# Patient Record
Sex: Male | Born: 1945
Health system: Southern US, Community
[De-identification: ages and names within clinical notes are randomized; demographics above are authoritative.]

## PROBLEM LIST (undated history)

## (undated) DIAGNOSIS — N281 Cyst of kidney, acquired: Secondary | ICD-10-CM

## (undated) DIAGNOSIS — R112 Nausea with vomiting, unspecified: Secondary | ICD-10-CM

## (undated) DIAGNOSIS — E039 Hypothyroidism, unspecified: Secondary | ICD-10-CM

## (undated) DIAGNOSIS — M5481 Occipital neuralgia: Secondary | ICD-10-CM

## (undated) DIAGNOSIS — T8859XA Other complications of anesthesia, initial encounter: Secondary | ICD-10-CM

## (undated) DIAGNOSIS — N2889 Other specified disorders of kidney and ureter: Secondary | ICD-10-CM

## (undated) DIAGNOSIS — F419 Anxiety disorder, unspecified: Secondary | ICD-10-CM

## (undated) DIAGNOSIS — R351 Nocturia: Secondary | ICD-10-CM

## (undated) DIAGNOSIS — K219 Gastro-esophageal reflux disease without esophagitis: Secondary | ICD-10-CM

## (undated) DIAGNOSIS — I10 Essential (primary) hypertension: Secondary | ICD-10-CM

## (undated) DIAGNOSIS — H269 Unspecified cataract: Secondary | ICD-10-CM

## (undated) DIAGNOSIS — C801 Malignant (primary) neoplasm, unspecified: Secondary | ICD-10-CM

## (undated) DIAGNOSIS — N400 Enlarged prostate without lower urinary tract symptoms: Secondary | ICD-10-CM

## (undated) DIAGNOSIS — M1990 Unspecified osteoarthritis, unspecified site: Secondary | ICD-10-CM

## (undated) DIAGNOSIS — Z8719 Personal history of other diseases of the digestive system: Secondary | ICD-10-CM

## (undated) DIAGNOSIS — T4145XA Adverse effect of unspecified anesthetic, initial encounter: Secondary | ICD-10-CM

## (undated) DIAGNOSIS — E785 Hyperlipidemia, unspecified: Secondary | ICD-10-CM

## (undated) DIAGNOSIS — G8929 Other chronic pain: Secondary | ICD-10-CM

## (undated) DIAGNOSIS — H3530 Unspecified macular degeneration: Secondary | ICD-10-CM

## (undated) DIAGNOSIS — I2510 Atherosclerotic heart disease of native coronary artery without angina pectoris: Secondary | ICD-10-CM

## (undated) DIAGNOSIS — Z9889 Other specified postprocedural states: Secondary | ICD-10-CM

## (undated) HISTORY — DX: Benign prostatic hyperplasia without lower urinary tract symptoms: N40.0

## (undated) HISTORY — PX: THYROIDECTOMY, PARTIAL: SHX18

## (undated) HISTORY — PX: APPENDECTOMY: SHX54

## (undated) HISTORY — DX: Occipital neuralgia: M54.81

## (undated) HISTORY — DX: Cyst of kidney, acquired: N28.1

## (undated) HISTORY — DX: Other chronic pain: G89.29

## (undated) HISTORY — PX: NASAL SINUS SURGERY: SHX719

## (undated) HISTORY — PX: TONSILLECTOMY: SUR1361

## (undated) HISTORY — DX: Hyperlipidemia, unspecified: E78.5

## (undated) HISTORY — DX: Nocturia: R35.1

## (undated) HISTORY — PX: COLONOSCOPY: SHX174

## (undated) HISTORY — PX: HERNIA REPAIR: SHX51

## (undated) HISTORY — PX: HEMORRHOID SURGERY: SHX153

---

## 1987-11-12 DIAGNOSIS — C801 Malignant (primary) neoplasm, unspecified: Secondary | ICD-10-CM

## 1987-11-12 HISTORY — DX: Malignant (primary) neoplasm, unspecified: C80.1

## 1996-11-11 DIAGNOSIS — Z85850 Personal history of malignant neoplasm of thyroid: Secondary | ICD-10-CM

## 1996-11-11 DIAGNOSIS — E039 Hypothyroidism, unspecified: Secondary | ICD-10-CM

## 1996-11-11 HISTORY — DX: Personal history of malignant neoplasm of thyroid: Z85.850

## 1996-11-11 HISTORY — PX: THYROIDECTOMY, PARTIAL: SHX18

## 1996-11-11 HISTORY — DX: Hypothyroidism, unspecified: E03.9

## 1998-07-12 ENCOUNTER — Ambulatory Visit (HOSPITAL_BASED_OUTPATIENT_CLINIC_OR_DEPARTMENT_OTHER): Admission: RE | Admit: 1998-07-12 | Discharge: 1998-07-12 | Payer: Self-pay | Admitting: Otolaryngology

## 2000-01-29 ENCOUNTER — Ambulatory Visit (HOSPITAL_COMMUNITY): Admission: RE | Admit: 2000-01-29 | Discharge: 2000-01-29 | Payer: Self-pay | Admitting: *Deleted

## 2001-04-09 ENCOUNTER — Encounter: Admission: RE | Admit: 2001-04-09 | Discharge: 2001-04-09 | Payer: Self-pay | Admitting: Endocrinology

## 2001-04-09 ENCOUNTER — Encounter: Payer: Self-pay | Admitting: Endocrinology

## 2001-04-10 ENCOUNTER — Encounter: Payer: Self-pay | Admitting: Endocrinology

## 2001-04-10 ENCOUNTER — Encounter: Admission: RE | Admit: 2001-04-10 | Discharge: 2001-04-10 | Payer: Self-pay | Admitting: Endocrinology

## 2001-11-23 ENCOUNTER — Encounter (INDEPENDENT_AMBULATORY_CARE_PROVIDER_SITE_OTHER): Payer: Self-pay | Admitting: *Deleted

## 2001-11-23 ENCOUNTER — Ambulatory Visit (HOSPITAL_BASED_OUTPATIENT_CLINIC_OR_DEPARTMENT_OTHER): Admission: RE | Admit: 2001-11-23 | Discharge: 2001-11-23 | Payer: Self-pay | Admitting: General Surgery

## 2004-05-15 ENCOUNTER — Encounter (INDEPENDENT_AMBULATORY_CARE_PROVIDER_SITE_OTHER): Payer: Self-pay | Admitting: Specialist

## 2004-05-15 ENCOUNTER — Ambulatory Visit (HOSPITAL_COMMUNITY): Admission: RE | Admit: 2004-05-15 | Discharge: 2004-05-15 | Payer: Self-pay | Admitting: *Deleted

## 2007-12-14 ENCOUNTER — Ambulatory Visit (HOSPITAL_COMMUNITY): Admission: RE | Admit: 2007-12-14 | Discharge: 2007-12-14 | Payer: Self-pay | Admitting: *Deleted

## 2009-08-30 ENCOUNTER — Encounter: Admission: RE | Admit: 2009-08-30 | Discharge: 2009-08-30 | Payer: Self-pay | Admitting: Endocrinology

## 2011-03-26 NOTE — Op Note (Signed)
NAME:  Eric Suarez, Eric Suarez NO.:  [id number]   MEDICAL RECORD NO.:  [id number]          PATIENT TYPE:  AMB   LOCATION:  ENDO                         FACILITY:  [facility]   PHYSICIAN:  Georgiana Spinner, M.D.    DATE OF BIRTH:  14-Jan-1946   DATE OF PROCEDURE:  12/14/2007  DATE OF DISCHARGE:                               OPERATIVE REPORT   PROCEDURE:  Upper endoscopy with Savary dilation.   INDICATIONS:  Dysphagia with known esophageal stricture.   ANESTHESIA:  Demerol 100 mg, Versed 5 mg.   PROCEDURE:  With the patient mildly sedated in the left lateral  decubitus position, the Pentax videoscopic endoscope was inserted in the  mouth and passed under direct vision through the esophagus which  appeared normal until we reached the distal esophagus, and there was a  stricture seen above a hiatal hernia.  This was photographed.  We  entered into the stomach.  Fundus, body, antrum, duodenal bulb, and  second portion duodenum were visualized.  From this point, the endoscope  was slowly withdrawn taking circumferential views of duodenal mucosa  until the endoscope had been pulled back into stomach and placed in  retroflexion to view the stomach from below.  The endoscope was then  straightened and withdrawn after a guidewire had been passed.  Subsequently, Savary dilators 16 and 18 were passed over the guidewire  using fluoroscopic control.  With the latter, the guidewire was removed.  The endoscope was reinserted.  A small amount of blood was noted at the  stricture.  Otherwise, this exam was unremarkable.  The endoscope was  withdrawn.  The patient's vital signs and pulse oximeter remained  stable.  The patient tolerated the procedure well without apparent  complications.   FINDINGS:  Distal esophageal stricture dilated to 16 and 18 Savary.   PLAN:  Await clinical response.  The patient will follow-up with me as  needed as an outpatient.     ______________________________  Georgiana Spinner, M.D.     GMO/MEDQ  D:  12/14/2007  T:  12/14/2007  Job:  [id number]

## 2011-03-29 NOTE — Op Note (Signed)
. [facility]  Patient:    Eric Suarez, Eric Suarez                     MRN: [id number] Adm. Date:  [id number] Attending:  Sabino Gasser                           Operative Report  PROCEDURE:  Colonoscopy.  INDICATIONS:  Rectal bleeding.  ANESTHESIA:  Demerol additionally 30 mg and Versed 3 mg were given intravenously in divided dose.  DESCRIPTION OF PROCEDURE:  With patient mildly sedated in the left lateral decubitus position, the Olympus videoscopic colonoscope was inserted in the rectum after normal rectal exam and passed under direct vision to the cecum.  The cecum was identified by the ileocecal valve and appendiceal orifice, both of which were photographed.  From this point, the colonoscope was slowly withdrawn taking circumferential views of the entire colonic mucosa stopping only to photograph are diverticula seen in the sigmoid colon until we pulled to the rectum which appeared normal in direct view and retroflex view as well.  The endoscope was then straightened and withdrawn.  The patients vital signs, pulse oximeter range was  stable.  The patient tolerated the procedure well without apparent complications.  FINDINGS:  Rare diverticulum of sigmoid colon; otherwise, unremarkable colonoscopic examination.  PLAN:  Will have the patient follow up with me in about six weeks for evaluation and treatment further of his reflux symptomatology. DD:  01/29/00 TD:  01/29/00 Job: [id number]

## 2011-03-29 NOTE — Op Note (Signed)
[place]  Patient:    Eric Suarez, Eric Suarez Visit Number: [id number] MRN: [id number]          Service Type: DSU Location: [facility] Attending Physician:  Henrene Dodge Dictated by:   Claire Shown. Zachery Dakins, M.D. Proc. Date: 11/23/01 Admit Date:  11/23/2001   CC:         Bernadene Person, M.D.   Operative Report  PREOPERATIVE DIAGNOSIS:  Bleeding internal hemorrhoids, polyp posterior.  POSTOPERATIVE DIAGNOSIS:  Bleeding internal hemorrhoids, polyp posterior.  OPERATION PERFORMED:  Excision of bleeding hemorrhoids anterior and posterior. and also external hemorrhoids posterior.  SURGEON:  Anselm Pancoast. Zachery Dakins, M.D.  ANESTHESIA:  General.  INDICATIONS FOR PROCEDURE:  The patient is a 65 year old male who I saw in the office last week.  He has had problems with recurring bleeding any time he does any strenuous lifting or squatting and on examination you could see internal hemorrhoids predominantly anterior and posterior but the area posterior was firm and had a little atypical appearance as if it was probably a polyp instead of actually fibrotic internal hemorrhoids.  He does not have a lot of external hemorrhoids.  There is a little tag anteriorly and I recommended that we excise this under general anesthesia and examine him more thoroughly.  The patient was in agreement and desired to have the procedure done promptly.  The patient had a colonoscopy approximately a year and a half ago by Dr. Sabino Gasser.  DESCRIPTION OF PROCEDURE:  The patient was taken to the operating room inducted general anesthesia.  Placed up in stirrups and then first I proctoscoped him and besides of the anal pathology, there was no abnormalities noted in the mucosa on up to about 20 cm.  It then removed the proctoscope and then prepped the anal area and external area with Betadine surgical scrub and solution and draped in a sterile manner.  A bullet retractor was used  and on examination, the internal hemorrhoid anteriorly looked just like kind of a large internal hemorrhoid.  The area posterior does not really look that muchy like a hemorrhoid but there is obviously a little growth that looks like a polyp at the base itself is fairly firm and whether this is kind of posterior fissure that has with kind of a sentinel pile little higher its just what I am not sure.  I first excised the anterior hemorrhoid kind of elevated it on a pedicle, underclamped it with Bowie clamp and then sutured the vessels with interrupted 2-0 chromic and then closed the little incision with a running 2-0 chromic for good hemostasis.  There was a little external tag I excised and I closed this with 3-0 chromic.  Then posterior, the area of concern I elected to basically use the larger bullet on this for better exposure and then elevated this fibrotic firm area off the internal sphincter and then the area more proximally completely excising the area.  Next, as far as there was not a significant amount of bleeding and I actually sutured the area using good locking sutures on the higher portion and then closing the external basically the internal sphincter area with the running 2-0 chromic.  I did divide a few of the internal sphincter fibers posterior in case it is kind of a chronic fissure type problem.  At the completion I looked at the suture line carefully whether to put additional stitches but there was no evidence of any bleeding and I watched it until  he was basically nearly awake.  I then used the smaller anoscope and put Xylocaine ointment within the anal canal on a little gauze kind of poked into the canal and then I removed all retractors , etc.  The patient will be released after a short stay in the recovery room.  He has had a previous problem with difficulty voiding and he is aware that if he is not able to void by later in the afternoon that he will need to return for  a Foley catheter since he had urinary retention and required a Foley catheter for approximately three weeks after appendectomy years ago.  I made sure that the two specimens were definitely labeled so that the posterior area could be examined.  I think that this is kind of a little polyp just above the dentate line plus a kind of chronic scarring and not that of an obvious malignant growth from gross examination. Dictated by:   Claire Shown. Zachery Dakins, M.D. Attending Physician:  Henrene Dodge DD:  11/23/01 TD:  11/23/01 Job: [id number]

## 2011-03-29 NOTE — Procedures (Signed)
[place]. [facility]  Patient:    RILYN, UPSHAW                     MRN: [id number] Proc. Date: 01/29/00 Adm. Date:  [id number] Attending:  Sabino Gasser                           Procedure Report  PROCEDURE:  Upper endoscopy.  INDICATIONS:  Reflux with pain.  ANESTHESIA:  Demerol 70 mg, Versed 7 mg was given intravenously in divided dose.  PROCEDURE:  With patient mildly sedated in the left lateral decubitus position, the Olympus videoscopic endoscope was inserted in the mouth and passed under direct  vision through the esophagus.  Distal esophagus was approached and appeared normal and was photographed.  We entered into the stomach, the antrum, duodenal bulb, second portion of duodenum and all appeared normal and were photographed.  From  this point, the endoscope was slowly withdrawn, taking circumferential views of the entire duodenal mucosa until the endoscope had been pulled back into the stomach, placed on retroflexion to view the stomach from below and this appeared grossly  normal.  The endoscope was then straightened and pulled back from distal to proximal stomach taking circumferential views of the entire gastric and subsequently esophageal mucosa, all of which appeared normal.   Patients vital signs and pulse oximeter remained stable.  The patient tolerated the procedure ell without apparent complications.  FINDINGS:  Essentially normal endoscopic examination.  PLAN:  Will treat his reflux symptoms symptomatically and proceed with colonoscopy as planned. DD:  01/29/00 TD:  01/29/00 Job: [id number]

## 2011-03-29 NOTE — Op Note (Signed)
NAME:  WYN, NETTLE                        ACCOUNT NO.:  [id number]   MEDICAL RECORD NO.:  [id number]                   PATIENT TYPE:  AMB   LOCATION:  ENDO                                 FACILITY:  [facility]   PHYSICIAN:  Georgiana Spinner, M.D.                 DATE OF BIRTH:  [birth year]   DATE OF PROCEDURE:  DATE OF DISCHARGE:                                 OPERATIVE REPORT   PROCEDURE:  Colonoscopy.   ENDOSCOPIST:  Georgiana Spinner, M.D.   INDICATIONS:  Hemoccult positivity.   ANESTHESIA:  Demerol 20, Versed 2 mg.   DESCRIPTION OF PROCEDURE:  With the patient mildly sedated in the left  lateral decubitus position, the Olympus videoscopic colonoscope was inserted  in the rectum, passed under direct vision to the cecum, identified by the  ileocecal valve and appendiceal orifice, both of which were photographed.  We entered into the terminal ileum which also appeared normal.  From the  point, the colonoscope was then slowly withdrawn taking circumferential  views of the colonic mucosa stopping in the sigmoid colon where  approximately 40 cm from the anal verge at which point a small polyp was  seen, photographed, and removed using hot biopsy forceps technique at a  setting of 20/200 with the ERBE pulse generator.  The endoscope was then  withdrawn to the rectum which appeared normal on direct and showed scarring  from his previous hemorrhoidectomy on retroflexed view with no hemorrhoids  noted.  The endoscope was straightened and withdrawn.  The patient's vital  signs and pulse oximeter remained stable.  The patient tolerated the  procedure well without apparent complications.   FINDINGS:  Small polyp at 40 cm from the anal verge in the sigmoid colon,  otherwise unremarkable colonoscopic examination, other than scattered  diverticula.   PLAN:  Await biopsy reported.  The patient will call me for the results and  follow up with me as an outpatient.                   Georgiana Spinner, M.D.    GMO/MEDQ  D:  05/15/2004  T:  05/15/2004  Job:  [id number]

## 2011-03-29 NOTE — Op Note (Signed)
NAME:  Eric Suarez, Eric Suarez                        ACCOUNT NO.:  [id number]   MEDICAL RECORD NO.:  [id number]                   PATIENT TYPE:  AMB   LOCATION:  ENDO                                 FACILITY:  [facility]   PHYSICIAN:  Georgiana Spinner, M.D.                 DATE OF BIRTH:  [birth year]   DATE OF PROCEDURE:  05/15/2004  DATE OF DISCHARGE:                                 OPERATIVE REPORT   PROCEDURE:  Upper endoscopy.   INDICATIONS:  Hemoccult positivity.   ANESTHESIA:  Demerol 60 mg, Versed 6 mg.   PROCEDURE:  With the patient mildly sedated in the left lateral decubitus  position, the Olympus videoscopic endoscope was inserted in the mouth,  passed under direct vision through the esophagus, which appeared normal.  There was a question of a distal esophageal stricture, photographed.  We  entered into the stomach.  The fundus, body, antrum, duodenal bulb, second  portion of the duodenum all appeared normal.  From this point the endoscope  was slowly withdrawn taking circumferential views of the duodenal mucosa  until the endoscope had been pulled back in the stomach, placed in  retroflexion to view the stomach from below.  The endoscope was straightened  and withdrawn taking circumferential views of the remaining gastric and  esophageal mucosa.  The patient's vital signs and pulse oximetry remained  stable.  The patient tolerated the procedure well with no apparent  complications.   FINDINGS:  Question of distal esophageal stricture by a hiatal hernia,  otherwise unremarkable examination.   PLAN:  Proceed to colonoscopy.                                               Georgiana Spinner, M.D.    GMO/MEDQ  D:  05/15/2004  T:  05/15/2004  Job:  [id number]

## 2015-11-12 DIAGNOSIS — C642 Malignant neoplasm of left kidney, except renal pelvis: Secondary | ICD-10-CM

## 2015-11-12 HISTORY — DX: Malignant neoplasm of left kidney, except renal pelvis: C64.2

## 2016-02-05 DIAGNOSIS — E291 Testicular hypofunction: Secondary | ICD-10-CM | POA: Diagnosis not present

## 2016-02-05 DIAGNOSIS — Z125 Encounter for screening for malignant neoplasm of prostate: Secondary | ICD-10-CM | POA: Diagnosis not present

## 2016-02-05 DIAGNOSIS — E789 Disorder of lipoprotein metabolism, unspecified: Secondary | ICD-10-CM | POA: Diagnosis not present

## 2016-02-05 DIAGNOSIS — Z0000 Encounter for general adult medical examination without abnormal findings: Secondary | ICD-10-CM | POA: Diagnosis not present

## 2016-02-05 DIAGNOSIS — I10 Essential (primary) hypertension: Secondary | ICD-10-CM | POA: Diagnosis not present

## 2016-02-06 DIAGNOSIS — H353132 Nonexudative age-related macular degeneration, bilateral, intermediate dry stage: Secondary | ICD-10-CM | POA: Diagnosis not present

## 2016-02-06 DIAGNOSIS — H2513 Age-related nuclear cataract, bilateral: Secondary | ICD-10-CM | POA: Diagnosis not present

## 2016-02-12 DIAGNOSIS — R7309 Other abnormal glucose: Secondary | ICD-10-CM | POA: Diagnosis not present

## 2016-02-12 DIAGNOSIS — E789 Disorder of lipoprotein metabolism, unspecified: Secondary | ICD-10-CM | POA: Diagnosis not present

## 2016-02-12 DIAGNOSIS — I10 Essential (primary) hypertension: Secondary | ICD-10-CM | POA: Diagnosis not present

## 2016-02-25 ENCOUNTER — Other Ambulatory Visit: Payer: Self-pay

## 2016-02-25 DIAGNOSIS — K279 Peptic ulcer, site unspecified, unspecified as acute or chronic, without hemorrhage or perforation: Secondary | ICD-10-CM | POA: Diagnosis not present

## 2016-02-25 DIAGNOSIS — I10 Essential (primary) hypertension: Secondary | ICD-10-CM | POA: Diagnosis not present

## 2016-02-25 DIAGNOSIS — R109 Unspecified abdominal pain: Secondary | ICD-10-CM | POA: Diagnosis not present

## 2016-02-25 DIAGNOSIS — D176 Benign lipomatous neoplasm of spermatic cord: Secondary | ICD-10-CM | POA: Diagnosis not present

## 2016-02-25 DIAGNOSIS — N178 Other acute kidney failure: Secondary | ICD-10-CM | POA: Diagnosis not present

## 2016-02-25 DIAGNOSIS — Y834 Other reconstructive surgery as the cause of abnormal reaction of the patient, or of later complication, without mention of misadventure at the time of the procedure: Secondary | ICD-10-CM | POA: Diagnosis not present

## 2016-02-25 DIAGNOSIS — E876 Hypokalemia: Secondary | ICD-10-CM | POA: Diagnosis not present

## 2016-02-25 DIAGNOSIS — K4030 Unilateral inguinal hernia, with obstruction, without gangrene, not specified as recurrent: Secondary | ICD-10-CM | POA: Diagnosis not present

## 2016-02-25 DIAGNOSIS — E7800 Pure hypercholesterolemia, unspecified: Secondary | ICD-10-CM | POA: Diagnosis not present

## 2016-02-25 DIAGNOSIS — N2889 Other specified disorders of kidney and ureter: Secondary | ICD-10-CM | POA: Diagnosis not present

## 2016-02-25 DIAGNOSIS — I9581 Postprocedural hypotension: Secondary | ICD-10-CM | POA: Diagnosis not present

## 2016-02-25 DIAGNOSIS — E872 Acidosis: Secondary | ICD-10-CM | POA: Diagnosis not present

## 2016-02-25 DIAGNOSIS — D1779 Benign lipomatous neoplasm of other sites: Secondary | ICD-10-CM | POA: Diagnosis not present

## 2016-02-25 DIAGNOSIS — K4090 Unilateral inguinal hernia, without obstruction or gangrene, not specified as recurrent: Secondary | ICD-10-CM | POA: Diagnosis not present

## 2016-02-25 DIAGNOSIS — K4031 Unilateral inguinal hernia, with obstruction, without gangrene, recurrent: Secondary | ICD-10-CM | POA: Diagnosis not present

## 2016-02-25 DIAGNOSIS — E039 Hypothyroidism, unspecified: Secondary | ICD-10-CM | POA: Diagnosis not present

## 2016-02-25 DIAGNOSIS — D179 Benign lipomatous neoplasm, unspecified: Secondary | ICD-10-CM | POA: Diagnosis not present

## 2016-02-29 DIAGNOSIS — K4030 Unilateral inguinal hernia, with obstruction, without gangrene, not specified as recurrent: Secondary | ICD-10-CM | POA: Insufficient documentation

## 2016-03-07 DIAGNOSIS — Z09 Encounter for follow-up examination after completed treatment for conditions other than malignant neoplasm: Secondary | ICD-10-CM | POA: Insufficient documentation

## 2016-03-12 DIAGNOSIS — D49512 Neoplasm of unspecified behavior of left kidney: Secondary | ICD-10-CM | POA: Diagnosis not present

## 2016-03-12 DIAGNOSIS — Z0000 Encounter for general adult medical examination without abnormal findings: Secondary | ICD-10-CM | POA: Diagnosis not present

## 2016-03-13 ENCOUNTER — Other Ambulatory Visit: Payer: Self-pay | Admitting: Urology

## 2016-03-18 DIAGNOSIS — D49512 Neoplasm of unspecified behavior of left kidney: Secondary | ICD-10-CM | POA: Diagnosis not present

## 2016-03-18 DIAGNOSIS — N2889 Other specified disorders of kidney and ureter: Secondary | ICD-10-CM | POA: Diagnosis not present

## 2016-03-26 NOTE — Patient Instructions (Addendum)
PERICLES CLUSTER  03/26/2016   Your procedure is scheduled on: Monday 04/01/2016  Report to [facility]  Entrance take [facility]  elevators to 3rd floor to  [place] at 145  PM.  Call this number if you have problems the morning of surgery [phone]   Remember: ONLY 1 PERSON MAY GO WITH YOU TO SHORT STAY TO GET  READY MORNING OF [place].  Do not eat food or drink liquids :After Midnight.     Take these medicines the morning of surgery with A SIP OF WATER: Paxil, Levothyroxine                                You may not have any metal on your body including hair pins and              piercings  Do not wear jewelry, make-up, lotions, powders or perfumes, deodorant             Do not wear nail polish.  Do not shave  48 hours prior to surgery.              Men may shave face and neck.   Do not bring valuables to the hospital. [place].  Contacts, dentures or bridgework may not be worn into surgery.  Leave suitcase in the car. After surgery it may be brought to your room.     Patients discharged the day of surgery will not be allowed to drive home.  Name and phone number of your driver:  Special Instructions: N/A              Please read over the following fact sheets you were given: _____________________________________________________________________             [facility] - Preparing for Surgery Before surgery, you can play an important role.  Because skin is not sterile, your skin needs to be as free of germs as possible.  You can reduce the number of germs on your skin by washing with CHG (chlorahexidine gluconate) soap before surgery.  CHG is an antiseptic cleaner which kills germs and bonds with the skin to continue killing germs even after washing. Please DO NOT use if you have an allergy to CHG or antibacterial soaps.  If your skin becomes reddened/irritated stop using the CHG and  inform your nurse when you arrive at Short Stay. Do not shave (including legs and underarms) for at least 48 hours prior to the first CHG shower.  You may shave your face/neck. Please follow these instructions carefully:  1.  Shower with CHG Soap the night before surgery and the  morning of Surgery.  2.  If you choose to wash your hair, wash your hair first as usual with your  normal  shampoo.  3.  After you shampoo, rinse your hair and body thoroughly to remove the  shampoo.                           4.  Use CHG as you would any other liquid soap.  You can apply chg directly  to the skin and wash  Gently with a scrungie or clean washcloth.  5.  Apply the CHG Soap to your body ONLY FROM THE NECK DOWN.   Do not use on face/ open                           Wound or open sores. Avoid contact with eyes, ears mouth and genitals (private parts).                       Wash face,  Genitals (private parts) with your normal soap.             6.  Wash thoroughly, paying special attention to the area where your surgery  will be performed.  7.  Thoroughly rinse your body with warm water from the neck down.  8.  DO NOT shower/wash with your normal soap after using and rinsing off  the CHG Soap.                9.  Pat yourself dry with a clean towel.            10.  Wear clean pajamas.            11.  Place clean sheets on your bed the night of your first shower and do not  sleep with pets. Day of Surgery : Do not apply any lotions/deodorants the morning of surgery.  Please wear clean clothes to the hospital/surgery center.  FAILURE TO FOLLOW THESE INSTRUCTIONS MAY RESULT IN THE CANCELLATION OF YOUR SURGERY PATIENT SIGNATURE_________________________________  NURSE SIGNATURE__________________________________  ________________________________________________________________________   Eric Suarez  An incentive spirometer is a tool that can help keep your lungs clear and  active. This tool measures how well you are filling your lungs with each breath. Taking long deep breaths may help reverse or decrease the chance of developing breathing (pulmonary) problems (especially infection) following:  A long period of time when you are unable to move or be active. BEFORE THE PROCEDURE   If the spirometer includes an indicator to show your best effort, your nurse or respiratory therapist will set it to a desired goal.  If possible, sit up straight or lean slightly forward. Try not to slouch.  Hold the incentive spirometer in an upright position. INSTRUCTIONS FOR USE  1. Sit on the edge of your bed if possible, or sit up as far as you can in bed or on a chair. 2. Hold the incentive spirometer in an upright position. 3. Breathe out normally. 4. Place the mouthpiece in your mouth and seal your lips tightly around it. 5. Breathe in slowly and as deeply as possible, raising the piston or the ball toward the top of the column. 6. Hold your breath for 3-5 seconds or for as long as possible. Allow the piston or ball to fall to the bottom of the column. 7. Remove the mouthpiece from your mouth and breathe out normally. 8. Rest for a few seconds and repeat Steps 1 through 7 at least 10 times every 1-2 hours when you are awake. Take your time and take a few normal breaths between deep breaths. 9. The spirometer may include an indicator to show your best effort. Use the indicator as a goal to work toward during each repetition. 10. After each set of 10 deep breaths, practice coughing to be sure your lungs are clear. If you have an incision (the cut made at the time of surgery),  support your incision when coughing by placing a pillow or rolled up towels firmly against it. Once you are able to get out of bed, walk around indoors and cough well. You may stop using the incentive spirometer when instructed by your caregiver.  RISKS AND COMPLICATIONS  Take your time so you do not get  dizzy or light-headed.  If you are in pain, you may need to take or ask for pain medication before doing incentive spirometry. It is harder to take a deep breath if you are having pain. AFTER USE  Rest and breathe slowly and easily.  It can be helpful to keep track of a log of your progress. Your caregiver can provide you with a simple table to help with this. If you are using the spirometer at home, follow these instructions: [place] IF:   You are having difficultly using the spirometer.  You have trouble using the spirometer as often as instructed.  Your pain medication is not giving enough relief while using the spirometer.  You develop fever of 100.5 F (38.1 C) or higher. SEEK IMMEDIATE MEDICAL CARE IF:   You cough up bloody sputum that had not been present before.  You develop fever of 102 F (38.9 C) or greater.  You develop worsening pain at or near the incision site. MAKE SURE YOU:   Understand these instructions.  Will watch your condition.  Will get help right away if you are not doing well or get worse. Document Released: 03/10/2007 Document Revised: 01/20/2012 Document Reviewed: 05/11/2007 [facility] Patient Information 2014 [place], [place].   ________________________________________________________________________

## 2016-03-27 ENCOUNTER — Ambulatory Visit (HOSPITAL_COMMUNITY)
Admission: RE | Admit: 2016-03-27 | Discharge: 2016-03-27 | Disposition: A | Discharge status: Home / Self Care | Payer: PPO | Source: Ambulatory Visit | Attending: Urology | Admitting: Urology

## 2016-03-27 ENCOUNTER — Encounter (HOSPITAL_COMMUNITY): Payer: Self-pay

## 2016-03-27 ENCOUNTER — Encounter (HOSPITAL_COMMUNITY)
Admission: RE | Admit: 2016-03-27 | Discharge: 2016-03-27 | Disposition: A | Discharge status: Home / Self Care | Payer: PPO | Source: Ambulatory Visit | Attending: Urology | Admitting: Urology

## 2016-03-27 DIAGNOSIS — I10 Essential (primary) hypertension: Secondary | ICD-10-CM | POA: Diagnosis not present

## 2016-03-27 DIAGNOSIS — Z01818 Encounter for other preprocedural examination: Secondary | ICD-10-CM | POA: Diagnosis not present

## 2016-03-27 DIAGNOSIS — R918 Other nonspecific abnormal finding of lung field: Secondary | ICD-10-CM | POA: Insufficient documentation

## 2016-03-27 HISTORY — DX: Gastro-esophageal reflux disease without esophagitis: K21.9

## 2016-03-27 HISTORY — DX: Unspecified osteoarthritis, unspecified site: M19.90

## 2016-03-27 HISTORY — DX: Essential (primary) hypertension: I10

## 2016-03-27 HISTORY — DX: Atherosclerotic heart disease of native coronary artery without angina pectoris: I25.10

## 2016-03-27 HISTORY — DX: Adverse effect of unspecified anesthetic, initial encounter: T41.45XA

## 2016-03-27 HISTORY — DX: Unspecified macular degeneration: H35.30

## 2016-03-27 HISTORY — DX: Hypothyroidism, unspecified: E03.9

## 2016-03-27 HISTORY — DX: Unspecified cataract: H26.9

## 2016-03-27 HISTORY — DX: Other complications of anesthesia, initial encounter: T88.59XA

## 2016-03-27 LAB — CBC
HCT: 39.4 % (ref 39.0–52.0)
Hemoglobin: 13.5 g/dL (ref 13.0–17.0)
MCH: 31.2 pg (ref 26.0–34.0)
MCHC: 34.3 g/dL (ref 30.0–36.0)
MCV: 91 fL (ref 78.0–100.0)
PLATELETS: 171 10*3/uL (ref 150–400)
RBC: 4.33 MIL/uL (ref 4.22–5.81)
RDW: 14.2 % (ref 11.5–15.5)
WBC: 7.1 10*3/uL (ref 4.0–10.5)

## 2016-03-27 LAB — BASIC METABOLIC PANEL
Anion gap: 9 (ref 5–15)
BUN: 29 mg/dL — ABNORMAL HIGH (ref 6–20)
CALCIUM: 8.7 mg/dL — AB (ref 8.9–10.3)
CHLORIDE: 108 mmol/L (ref 101–111)
CO2: 22 mmol/L (ref 22–32)
Creatinine, Ser: 1.22 mg/dL (ref 0.61–1.24)
GFR calc non Af Amer: 58 mL/min — ABNORMAL LOW (ref >60)
GLUCOSE: 99 mg/dL (ref 65–99)
Potassium: 3.5 mmol/L (ref 3.5–5.1)
Sodium: 139 mmol/L (ref 135–145)

## 2016-03-28 NOTE — Progress Notes (Signed)
Chest xray results faxed to dr borden main fax medical record and pod fax by epic

## 2016-03-31 NOTE — H&P (Signed)
Chief Complaint Left renal neoplasm   History of Present Illness Eric Suarez is a pleasant 70 year old gentleman seen today at the request of Dr. Eda Keys for a left renal neoplasm. Approximately 2 weeks ago, he presented to the emergency department at [facility] in [place]. He presented with severe abdominal pain which prompted a CT scan of the chest, abdomen, and pelvis with contrast. He was noted to have a large incarcerated left inguinal hernia as the source of his pain. He underwent surgical treatment including a left open inguinal hernia repair and subsequent exploratory laparoscopy to ensure that his bowel remained viable. He ultimately did not require any surgery on his bowel. Incidentally, he was noted to have a few small bilateral pulmonary nodules. He also was noted to have a 4.7 cm heterogeneous appearing upper pole left renal mass. This mass did appear hyperdense raising concern for enhancement although no noncontrasted scan was performed. This mass also appeared consistent with a renal cell carcinoma although was completely endophytic and does raise a question that it could represent a urothelial carcinoma although delayed images did not strongly suggest this. He has denied any hematuria or flank pain. He denies any unintentional weight loss, night sweats, or poor appetite. He has been healing well from his hernia repair and has no specific complaints today.    He has been followed by Dr. Junious Silk in the past for a history of an elevated PSA. He had previously undergone a prostate biopsy by Dr. Serita Butcher when his PSA was around 5 in the past. His more recent PSA levels have been in the mid 3 range. He also has a history of BPH although has not required medical therapy. He has wife have actively on and worked for their own [employer] until the spring when he retired after his recent hospitalization.    He has no family history of kidney cancer or end-stage renal  disease.   Past Medical History Problems  1. History of Arthritis 2. History of acute prostatitis (Z87.438) 3. History of heartburn (Z87.898) 4. History of hypertension (Z86.79) 5. History of Thyroid Cancer  He has a history of thyroid cancer status post surgical removal in 1989. He has had no evidence of recurrence.   Surgical History Problems  1. History of Appendectomy 2. History of Exploratory Laparoscopy 3. History of Hemorrhoidectomy 4. History of Hernia Repair 5. History of Thyroid Surgery  His prior surgeries include a history of a right lower quadrant incision for an appendectomy, multiple bilateral open inguinal hernia repairs including his most recent repair on the left side which was his third on that side. He also has undergone exploratory laparoscopy stated above.   Current Meds 1. Atenolol 50 MG Oral Tablet;  Therapy: (Recorded:18Jan2008) to Recorded 2. Benicar TABS (Olmesartan Medoxomil);  Therapy: ([id number]) to Recorded 3. Chlorthalidone 25 MG Oral Tablet; TAKE 1 TABLET DAILY AS NEEDED FOR BLOOD  PRESSURE;  Therapy: 12Sep2012 to Recorded 4. Fish Oil CAPS;  Therapy: (Recorded:18Jan2008) to Recorded 5. HydroCHLOROthiazide 25 MG Oral Tablet;  Therapy: (Recorded:18Jan2008) to Recorded 6. Klor-Con M20 20 MEQ Oral Tablet Extended Release;  Therapy: [id number] to Recorded 7. Prevacid 15 MG Oral Capsule Delayed Release (Lansoprazole);  Therapy: (Recorded:18Jan2008) to Recorded 8. Synthroid TABS (Levothyroxine Sodium);  Therapy: (Recorded:18Jan2008) to Recorded  Allergies Medication  1. Sulfa Drugs  Family History Problems  1. Family history of Acute Myocardial Infarction : Mother 2. Family history of Death In The Family Father   Deceased at age 45 3. Family  history of Death In The Family Mother   Deceased at age 78; MI 94. Family history of Family Health Status Number Of Children   1 son 5. Family history of Hypertension : Father 66. Family  history of Hypertension : Mother 20. Family history of Hypertension : Brother 1. Family history of Nephrolithiasis  Social History Problems    Caffeine Use   1   Marital History - Currently Married   Never A Smoker   Occupation:   Development worker, [employer]-  Review of Systems Genitourinary, constitutional, skin, eye, otolaryngeal, hematologic/lymphatic, cardiovascular, pulmonary, endocrine, musculoskeletal, gastrointestinal, neurological and psychiatric system(s) were reviewed and pertinent findings if present are noted and are otherwise negative.  Genitourinary: no hematuria.  Constitutional: no fever, no night sweats and no recent weight loss.  Cardiovascular: no leg swelling.    Vitals Vital Signs [Data Includes: Last 1 Day]  Recorded: [id number] 11:23AM  Weight: 205 lb  BMI Calculated: 25.62 BSA Calculated: 2.22 Blood Pressure: 96 / 64 Heart Rate: 59  Physical Exam Constitutional: Well nourished and well developed . No acute distress.  ENT:. The ears and nose are normal in appearance.  Neck: The appearance of the neck is normal and no neck mass is present.  Pulmonary: No respiratory distress, normal respiratory rhythm and effort and clear bilateral breath sounds.  Cardiovascular: Heart rate and rhythm are normal . No peripheral edema.  Abdomen: right lower quadrant, periumbilical, left inguinal, right inguinal incision site(s) well healed. The abdomen is soft and nontender. No masses are palpated. No CVA tenderness. No hernias are palpable. No hepatosplenomegaly noted.  Lymphatics: The supraclavicular, femoral and inguinal nodes are not enlarged or tender.  Skin: Normal skin turgor, no visible rash and no visible skin lesions.  Neuro/Psych:. Mood and affect are appropriate.    Results/Data Urine [Data Includes: Last 1 Day]   [id number] COLOR YELLOW  APPEARANCE CLEAR  SPECIFIC GRAVITY 1.015  pH 6.5  GLUCOSE NEGATIVE  BILIRUBIN NEGATIVE  KETONE NEGATIVE  BLOOD TRACE   PROTEIN NEGATIVE  NITRITE NEGATIVE  LEUKOCYTE ESTERASE NEGATIVE  SQUAMOUS EPITHELIAL/HPF 0-5 HPF WBC 0-5 WBC/HPF RBC 0-2 RBC/HPF BACTERIA NONE SEEN HPF CRYSTALS NONE SEEN HPF CASTS NONE SEEN LPF Yeast NONE SEEN HPF  I have independently reviewed his medical records and his recent CT scan from 02/25/16. Findings are as dictated above.   Assessment Assessed  1. Neoplasm of left kidney [id number])  Plan Health Maintenance  1. UA With REFLEX; [Do Not Release]; Status:Complete;   [id number] 10:56AM Neoplasm of left kidney  2. CBC w/PLT NO DIFF; Status:In Progress - Specimen/Data Collected;   Done: [id number] 3. CMP with Estimated GFR; Status:In Progress - Specimen/Data Collected;   Done:  [id number] 4. CT-ABD W/W/O CONTRAST; Status:Hold For - Appointment,PreCert,Date of Service,Print;  Requested for:02May2017;  5. VENIPUNCTURE; Status:Complete;   [id number] 6. Follow-up Office  Follow-up  Status: Hold For - Appointment,Date of Service  Requested  for: [id number]  Discussion/Summary 1. Left renal neoplasm concerning for malignancy: I had a detailed discussion with Eric Suarez and his wife today regarding his left renal mass. I expressed concern that this is suspicious for a malignancy and most likely a renal cell carcinoma although I cannot completely rule out a urothelial carcinoma based on its appearance. We also discussed the fact that he did not undergo noncontrast imaging to definitively determine if this mass is clearly enhancing. We also discussed his pulmonary nodules which are nonspecific although suspicious considering he may have a renal malignancy. He will need further  follow-up with a CT scan in 3-6 months. Otherwise, he has no clear evidence of metastatic disease. We discussed the possibility that he could have either a renal cell carcinoma or urothelial carcinoma. I recommended that he undergo further definitive imaging with and without IV contrast of the abdomen for  further evaluation. He understands that this may not definitively determine whether this is a renal cell carcinoma versus urothelial carcinoma and I have recommended that he proceed with cystoscopy and left retrograde pyelography and possible ureteroscopy for further evaluation. He understands that the procedure choice would either be a laparoscopic radical nephrectomy versus robot-assisted laparoscopic nephroureterectomy pending these findings.   The patient was provided information regarding their renal mass including the relative risk of benign versus malignant pathology and the natural history of renal cell carcinoma and other possible malignancies of the kidney. The role of renal biopsy, laboratory testing, and imaging studies to further characterize renal masses and/or the presence of metastatic disease were explained. We discussed the role of active surveillance, surgical therapy with both radical nephrectomy and nephron-sparing surgery, and ablative therapy in the treatment of renal masses. In addition, we discussed our goals of providing an accurate diagnosis and oncologic control while maintaining optimal renal function as appropriate based on the size, location, and complexity of their renal mass as well as their co-morbidities.    We have discussed the risks of treatment in detail including but not limited to bleeding, infection, heart attack, stroke, death, venothromoboembolism, cancer recurrence, injury/damage to surrounding organs and structures, urine leak, the possibility of open surgical conversion for patients undergoing minimally invasive surgery, the risk of developing chronic kidney disease and its associated implications, and the potential risk of end stage renal disease possibly necessitating dialysis.     Our plan is as follows:  1. He will complete his metastatic evaluation with laboratory studies and will undergo definitive imaging with a CT scan of the abdomen with and without IV  contrast.  2. He will be tentatively scheduled for cystoscopy, left retrograde pyelography, possible left ureteroscopy and biopsy.  3. He will be tentatively scheduled for a laparoscopic radical nephrectomy considering the high likelihood that he does have renal cell carcinoma although understands the procedure choice may change pending his endoscopic evaluation.  4. He will undergo further imaging of the chest with a CT scan in 3-6 months for evaluation of his pulmonary nodules.    Cc: Dr. Gareth Eagle  Dr. Festus Aloe   A total of 65 minutes were spent in the overall care of the patient today with 55 minutes in direct face to face consultation.    Verified Results CT-ABD W/W/O CONTRAST1 [id number] 12:00AM1 Read Drivers [facility] [place] 9, 2017 1:41PM Alinda Money, Leodan Bolyard] Please let Eric Suarez know that I reviewed his CT scan. His mass remains concerning for a kidney cancer and we should proceed as planned with his upcoming procedures which are scheduled.  Test Name Result Flag Reference CT-ABD W/W/O CONTRAST1 (Report)1   ** RADIOLOGY REPORT BY [facility] **   CLINICAL DATA: Left renal mass seen on a prior CT scan from 02/25/2016.  EXAM: CT ABDOMEN WITHOUT AND WITH CONTRAST  TECHNIQUE: Multidetector CT imaging of the abdomen was performed following the standard protocol before and following the bolus administration of intravenous contrast.  CONTRAST: 125 cc Isovue 300  COMPARISON: 02/25/2016 CT scan  FINDINGS: Lower chest: Stable scarring in the right lower lobe and subsegmental atelectasis in the left lower lobe. Stable nodules in the left lower  lobe including a 5 mm subpleural nodule on image 16/7  Hepatobiliary: 4 mm hypodense lesion posteriorly in the right hepatic lobe on image 44/6, too small to characterize, probably present on 08/30/2009 and so highly likely to be benign. Gallbladder unremarkable.  Pancreas: Unremarkable  Spleen: Upper normal splenic  size.  Adrenals/Urinary Tract: 4.7 by 4.7 cm mass in the left splenic hilum on image 65/6, by my measurements previously 4.7 by 4.6 cm at this level on the prior exam from 3 weeks ago, extending up to involve the upper pole of the kidney and partially exophytic from the upper pole. Multiple thick septations and cystic elements in this mass, compatible with renal cell carcinoma. There is some small hypodense lesions in the right kidney which are probably cysts. I do not observe any tumor thrombus in the left renal vein. No obvious periaortic adenopathy. The mass has a considerable degree of septal arterial phase enhancement. No associated calcifications.  Stomach/Bowel: Small type 1 hiatal hernia.  Vascular/Lymphatic: 10 mm peripancreatic lymph node borderline enlarged image 47/6. Aortoiliac atherosclerotic vascular disease.  Other: No supplemental non-categorized findings.  Musculoskeletal: Endplate sclerosis and degenerative disc disease at L2-3. Probable Schmorl's node along the superior endplate of L3.  Small umbilical hernia contains adipose tissue.  IMPRESSION: 1. 4.7 cm mass extending from the upper left renal hilum through the upper pole of the kidney, compatible with renal cell carcinoma. No tumor thrombus in the left renal vein or adenopathy identified. No change in size based on my measurements compared to the prior exam from 3 weeks ago. 2. Stable left lower lobe nodules including a 5 mm subpleural nodule on image 16/7. 3. Small type 1 hiatal hernia.   Electronically Signed  By: Van Clines M.D.  On: 03/18/2016 16:13    1. Amended By: Raynelle Bring; Mar 19 2016 1:41 PM EST  Signatures Electronically signed by : Raynelle Bring, M.D.; Mar 19 2016  1:41PM EST

## 2016-04-01 ENCOUNTER — Ambulatory Visit (HOSPITAL_COMMUNITY): Payer: PPO | Admitting: Certified Registered Nurse Anesthetist

## 2016-04-01 ENCOUNTER — Ambulatory Visit (HOSPITAL_COMMUNITY)
Admission: RE | Admit: 2016-04-01 | Discharge: 2016-04-01 | Disposition: A | Discharge status: Home / Self Care | Payer: PPO | Source: Ambulatory Visit | Attending: Urology | Admitting: Urology

## 2016-04-01 ENCOUNTER — Encounter (HOSPITAL_COMMUNITY)
Admission: RE | Disposition: A | Discharge status: Home / Self Care | Payer: Self-pay | Source: Ambulatory Visit | Attending: Urology

## 2016-04-01 ENCOUNTER — Encounter (HOSPITAL_COMMUNITY): Payer: Self-pay

## 2016-04-01 DIAGNOSIS — I2510 Atherosclerotic heart disease of native coronary artery without angina pectoris: Secondary | ICD-10-CM | POA: Insufficient documentation

## 2016-04-01 DIAGNOSIS — I10 Essential (primary) hypertension: Secondary | ICD-10-CM | POA: Diagnosis not present

## 2016-04-01 DIAGNOSIS — Z79899 Other long term (current) drug therapy: Secondary | ICD-10-CM | POA: Diagnosis not present

## 2016-04-01 DIAGNOSIS — Z85850 Personal history of malignant neoplasm of thyroid: Secondary | ICD-10-CM | POA: Diagnosis not present

## 2016-04-01 DIAGNOSIS — N3289 Other specified disorders of bladder: Secondary | ICD-10-CM | POA: Insufficient documentation

## 2016-04-01 DIAGNOSIS — N289 Disorder of kidney and ureter, unspecified: Secondary | ICD-10-CM | POA: Diagnosis not present

## 2016-04-01 DIAGNOSIS — H3530 Unspecified macular degeneration: Secondary | ICD-10-CM | POA: Insufficient documentation

## 2016-04-01 DIAGNOSIS — K219 Gastro-esophageal reflux disease without esophagitis: Secondary | ICD-10-CM | POA: Insufficient documentation

## 2016-04-01 DIAGNOSIS — Z9049 Acquired absence of other specified parts of digestive tract: Secondary | ICD-10-CM | POA: Insufficient documentation

## 2016-04-01 DIAGNOSIS — D49512 Neoplasm of unspecified behavior of left kidney: Secondary | ICD-10-CM | POA: Diagnosis not present

## 2016-04-01 HISTORY — PX: CYSTOSCOPY WITH RETROGRADE PYELOGRAM, URETEROSCOPY AND STENT PLACEMENT: SHX5789

## 2016-04-01 SURGERY — CYSTOURETEROSCOPY, WITH RETROGRADE PYELOGRAM AND STENT INSERTION
Anesthesia: General | Site: Ureter | Laterality: Left

## 2016-04-01 MED ORDER — LACTATED RINGERS IV SOLN
INTRAVENOUS | Status: DC
  Administered 2016-04-01 (×2): via INTRAVENOUS

## 2016-04-01 MED ORDER — PROPOFOL 10 MG/ML IV BOLUS
INTRAVENOUS | Status: AC
  Filled 2016-04-01: qty 20

## 2016-04-01 MED ORDER — LIDOCAINE HCL (CARDIAC) 20 MG/ML IV SOLN
INTRAVENOUS | Status: AC
  Filled 2016-04-01: qty 5

## 2016-04-01 MED ORDER — SODIUM CHLORIDE 0.9 % IR SOLN
Status: DC | PRN
  Administered 2016-04-01: 3000 mL

## 2016-04-01 MED ORDER — FENTANYL CITRATE (PF) 100 MCG/2ML IJ SOLN
INTRAMUSCULAR | Status: DC | PRN
  Administered 2016-04-01 (×2): 50 ug via INTRAVENOUS

## 2016-04-01 MED ORDER — PHENAZOPYRIDINE HCL 100 MG PO TABS: 100.0000 mg | ORAL_TABLET | Freq: Three times a day (TID) | ORAL | Status: DC | PRN

## 2016-04-01 MED ORDER — CIPROFLOXACIN IN D5W 400 MG/200ML IV SOLN
400.0000 mg | INTRAVENOUS | Status: AC
  Administered 2016-04-01: 400 mg via INTRAVENOUS

## 2016-04-01 MED ORDER — LIDOCAINE HCL (CARDIAC) 20 MG/ML IV SOLN
INTRAVENOUS | Status: DC | PRN
  Administered 2016-04-01: 80 mg via INTRAVENOUS
  Administered 2016-04-01: 20 mg via INTRAVENOUS

## 2016-04-01 MED ORDER — DEXAMETHASONE SODIUM PHOSPHATE 10 MG/ML IJ SOLN
INTRAMUSCULAR | Status: DC | PRN
  Administered 2016-04-01: 5 mg via INTRAVENOUS

## 2016-04-01 MED ORDER — FENTANYL CITRATE (PF) 100 MCG/2ML IJ SOLN
INTRAMUSCULAR | Status: AC
Start: 2016-04-01 — End: 2016-04-01
  Filled 2016-04-01: qty 2

## 2016-04-01 MED ORDER — ONDANSETRON HCL 4 MG/2ML IJ SOLN
INTRAMUSCULAR | Status: AC
  Filled 2016-04-01: qty 2

## 2016-04-01 MED ORDER — DEXAMETHASONE SODIUM PHOSPHATE 10 MG/ML IJ SOLN
INTRAMUSCULAR | Status: AC
  Filled 2016-04-01: qty 1

## 2016-04-01 MED ORDER — DIATRIZOATE MEGLUMINE 30 % UR SOLN
URETHRAL | Status: AC
  Filled 2016-04-01: qty 100

## 2016-04-01 MED ORDER — CEFAZOLIN SODIUM-DEXTROSE 2-4 GM/100ML-% IV SOLN
INTRAVENOUS | Status: AC
  Filled 2016-04-01: qty 100

## 2016-04-01 MED ORDER — ONDANSETRON HCL 4 MG/2ML IJ SOLN
INTRAMUSCULAR | Status: DC | PRN
  Administered 2016-04-01 (×2): 2 mg via INTRAVENOUS

## 2016-04-01 MED ORDER — ATENOLOL 50 MG PO TABS
50.0000 mg | ORAL_TABLET | Freq: Once | ORAL | Status: AC
  Administered 2016-04-01: 50 mg via ORAL
  Filled 2016-04-01: qty 1

## 2016-04-01 MED ORDER — PROPOFOL 10 MG/ML IV BOLUS
INTRAVENOUS | Status: DC | PRN
  Administered 2016-04-01: 50 mg via INTRAVENOUS
  Administered 2016-04-01: 200 mg via INTRAVENOUS

## 2016-04-01 MED ORDER — CEFAZOLIN SODIUM-DEXTROSE 2-4 GM/100ML-% IV SOLN
2.0000 g | INTRAVENOUS | Status: AC
  Administered 2016-04-01: 2 g via INTRAVENOUS
  Filled 2016-04-01: qty 100

## 2016-04-01 MED ORDER — CIPROFLOXACIN IN D5W 400 MG/200ML IV SOLN
INTRAVENOUS | Status: AC
  Filled 2016-04-01: qty 200

## 2016-04-01 MED ORDER — FENTANYL CITRATE (PF) 100 MCG/2ML IJ SOLN: 25.0000 ug | INTRAMUSCULAR | Status: DC | PRN

## 2016-04-01 SURGICAL SUPPLY — 18 items
BAG URO CATCHER STRL LF (MISCELLANEOUS) ×3 IMPLANT
BASKET ZERO TIP NITINOL 2.4FR (BASKET) IMPLANT
BSKT STON RTRVL ZERO TP 2.4FR (BASKET)
CATH INTERMIT  6FR 70CM (CATHETERS) IMPLANT
CLOTH BEACON ORANGE TIMEOUT ST (SAFETY) ×3 IMPLANT
FIBER LASER FLEXIVA 365 (UROLOGICAL SUPPLIES) IMPLANT
FIBER LASER TRAC TIP (UROLOGICAL SUPPLIES) IMPLANT
GLOVE BIOGEL M STRL SZ7.5 (GLOVE) ×3 IMPLANT
GOWN STRL REUS W/TWL LRG LVL3 (GOWN DISPOSABLE) ×6 IMPLANT
GUIDEWIRE ANG ZIPWIRE 038X150 (WIRE) IMPLANT
GUIDEWIRE STR DUAL SENSOR (WIRE) ×3 IMPLANT
IV NS 1000ML (IV SOLUTION) ×3
IV NS 1000ML BAXH (IV SOLUTION) ×1 IMPLANT
MANIFOLD NEPTUNE II (INSTRUMENTS) ×3 IMPLANT
PACK CYSTO (CUSTOM PROCEDURE TRAY) ×3 IMPLANT
SHEATH ACCESS URETERAL 38CM (SHEATH) IMPLANT
TUBING CONNECTING 10 (TUBING) ×2 IMPLANT
TUBING CONNECTING 10' (TUBING) ×1

## 2016-04-01 NOTE — Anesthesia Procedure Notes (Addendum)
Procedure Name: LMA Insertion Date/Time: 04/01/2016 4:57 PM Performed by: Freddie Breech Pre-anesthesia Checklist: Patient identified, Emergency Drugs available, Suction available, Patient being monitored and Timeout performed Patient Re-evaluated:Patient Re-evaluated prior to inductionOxygen Delivery Method: Circle system utilized Preoxygenation: Pre-oxygenation with 100% oxygen Intubation Type: IV induction LMA: LMA inserted LMA Size: 5.0 Number of attempts: 1 Placement Confirmation: positive ETCO2,  CO2 detector and breath sounds checked- equal and bilateral Tube secured with: Tape Dental Injury: Teeth and Oropharynx as per pre-operative assessment

## 2016-04-01 NOTE — Op Note (Signed)
Preoperative diagnosis:  1. Left renal neoplasm  Postoperative diagnosis: 1. Left renal neoplasm  Procedure(s): 1. Cystoscopy 2.  Left retrograde pyelography  Surgeon: Dr. Roxy Horseman, Jr  Anesthesia: General  Complications: none  EBL: none  Specimens: none  Intraoperative findings:  Left retrograde pyelogram was performed using Cystografin and a 6 French ureteral catheter.  This demonstrated a normal caliber ureter without filling defects. The upper pole left renal collecting system was displaced laterally consistent with the patient's known medial/upper pole renal mass.  No collecting system filling defects were identified to suggest a urothelial carcinoma.  Indication: [name] is a 70 year old gentleman with an incidentally detected enhancing left renal neoplasm.  He presents today for further diagnostic evaluation to determine whether this is concerning for a urothelial carcinoma versus renal cell carcinoma.  The potential risks, complications, and expected recovery process associated with the above procedures were discussed in detail. Informed consent was obtained.  Description of procedure:  The patient was taken to the operating room and a general anesthetic was administered.  He was given preoperative antibiotics, placed in the dorsal lithotomy position, and prepped and draped in the usual sterile fashion.  A preoperative timeout was performed.  Cystourethroscopy was then performed with a 22 French cystoscope sheath.  The bladder was examined with a   30 and 70 lens.  There was noted to be no abnormalities of the anterior urethra.  The prostatic urethra did demonstrate a median lobe.  There was mild trabeculation throughout the bladder.  No bladder stones, tumors, or other mucosal pathology was identified.  The ureteral orifices were in their expected anatomic location and effluxing clear urine.   Attention then turned to the left ureteral orifice.  A 6 French  ureteral catheter was inserted into the distal left and Cystografin contrast was injected.  Findings are as dictated above.  In summary, finding suggested a renal parenchymal mass without evidence of a urothelial tumor.  The patient's bladder was emptied and the procedure was ended.  He tolerated the procedure well and without complications.  He was able to be transferred to the recovery unit in satisfactory condition.

## 2016-04-01 NOTE — Anesthesia Postprocedure Evaluation (Signed)
Anesthesia Post Note  Patient: Eric Suarez  Procedure(s) Performed: Procedure(s) (LRB): CYSTOSCOPY WITH RETROGRADE PYELOGRAM, left ureter (Left)  Patient location during evaluation: PACU Anesthesia Type: General Level of consciousness: awake and alert Pain management: pain level controlled Vital Signs Assessment: post-procedure vital signs reviewed and stable Respiratory status: spontaneous breathing, nonlabored ventilation, respiratory function stable and patient connected to nasal cannula oxygen Cardiovascular status: blood pressure returned to baseline and stable Postop Assessment: no signs of nausea or vomiting Anesthetic complications: no    Last Vitals:  Filed Vitals:   04/01/16 1753 04/01/16 1829  BP: 113/65 116/65  Pulse: 55 63  Temp: 36.7 C 36.6 C  Resp: 16 16    Last Pain:  Filed Vitals:   04/01/16 1831  PainSc: 2                  Courtland Reas,JAMES TERRILL

## 2016-04-01 NOTE — Interval H&P Note (Signed)
History and Physical Interval Note:  04/01/2016 3:50 PM  Eric Suarez  has presented today for surgery, with the diagnosis of LEFT RENAL NEOPLASM  The various methods of treatment have been discussed with the patient and family. After consideration of risks, benefits and other options for treatment, the patient has consented to  Procedure(s): CYSTOSCOPY WITH RETROGRADE PYELOGRAM, URETEROSCOPY  WITH BIOPSY AND POSSIBLE STENT PLACEMENT (Left) as a surgical intervention .  The patient's history has been reviewed, patient examined, no change in status, stable for surgery.  I have reviewed the patient's chart and labs.  Questions were answered to the patient's satisfaction.     Wilberta Dorvil,LES

## 2016-04-01 NOTE — Discharge Instructions (Addendum)
1. You may see some blood in the urine and may have some burning with urination for 48-72 hours. You also may notice that you have to urinate more frequently or urgently after your procedure which is normal.  2. You should call should you develop an inability urinate, fever > 101, persistent nausea and vomiting that prevents you from eating or drinking to stay hydrated.      General Anesthesia, Adult, Care After Refer to this sheet in the next few weeks. These instructions provide you with information on caring for yourself after your procedure. Your health care provider may also give you more specific instructions. Your treatment has been planned according to current medical practices, but problems sometimes occur. Call your health care provider if you have any problems or questions after your procedure. WHAT TO EXPECT AFTER THE PROCEDURE After the procedure, it is typical to experience:  Sleepiness.  Nausea and vomiting. HOME CARE INSTRUCTIONS  For the first 24 hours after general anesthesia:  Have a responsible person with you.  Do not drive a car. If you are alone, do not take public transportation.  Do not drink alcohol.  Do not take medicine that has not been prescribed by your health care provider.  Do not sign important papers or make important decisions.  You may resume a normal diet and activities as directed by your health care provider.  Change bandages (dressings) as directed.  If you have questions or problems that seem related to general anesthesia, call the hospital and ask for the anesthetist or anesthesiologist on call. SEEK MEDICAL CARE IF:  You have nausea and vomiting that continue the day after anesthesia.  You develop a rash. SEEK IMMEDIATE MEDICAL CARE IF:   You have difficulty breathing.  You have chest pain.  You have any allergic problems.   This information is not intended to replace advice given to you by your health care provider. Make sure  you discuss any questions you have with your health care provider.   Document Released: 02/03/2001 Document Revised: 11/18/2014 Document Reviewed: 02/26/2012 Elsevier Interactive Patient Education [employer].

## 2016-04-01 NOTE — Transfer of Care (Signed)
Immediate Anesthesia Transfer of Care Note  Patient: Eric Suarez  Procedure(s) Performed: Procedure(s): CYSTOSCOPY WITH RETROGRADE PYELOGRAM, left ureter (Left)  Patient Location: PACU  Anesthesia Type:General  Level of Consciousness:  sedated, patient cooperative and responds to stimulation  Airway & Oxygen Therapy:Patient Spontanous Breathing and Patient connected to face mask oxgen  Post-op Assessment:  Report given to PACU RN and Post -op Vital signs reviewed and stable  Post vital signs:  Reviewed and stable  Last Vitals:  Filed Vitals:   04/01/16 1322  BP: 105/68  Pulse: 71  Temp: 36.6 C  Resp: 16    Complications: No apparent anesthesia complications

## 2016-04-01 NOTE — Anesthesia Preprocedure Evaluation (Signed)
Anesthesia Evaluation  Patient identified by MRN, date of birth, ID band Patient awake    Reviewed: Allergy & Precautions, H&P , NPO status , Patient's Chart, lab work & pertinent test results, reviewed documented beta blocker date and time   Airway Mallampati: II  TM Distance: >3 FB Neck ROM: full    Dental no notable dental hx. (+) Dental Advisory Given, Teeth Intact   Pulmonary neg pulmonary ROS,    Pulmonary exam normal breath sounds clear to auscultation       Cardiovascular Exercise Tolerance: Good hypertension, Pt. on home beta blockers + CAD  Normal cardiovascular exam Rhythm:regular Rate:Normal     Neuro/Psych Macular degeneration negative neurological ROS  negative psych ROS   GI/Hepatic negative GI ROS, Neg liver ROS, GERD  Medicated and Controlled,  Endo/Other  negative endocrine ROSHypothyroidism   Renal/GU negative Renal ROS  negative genitourinary   Musculoskeletal   Abdominal   Peds  Hematology negative hematology ROS (+)   Anesthesia Other Findings   Reproductive/Obstetrics negative OB ROS                             Anesthesia Physical Anesthesia Plan  ASA: III  Anesthesia Plan: General   Post-op Pain Management:    Induction: Intravenous  Airway Management Planned: LMA  Additional Equipment:   Intra-op Plan:   Post-operative Plan:   Informed Consent: I have reviewed the patients History and Physical, chart, labs and discussed the procedure including the risks, benefits and alternatives for the proposed anesthesia with the patient or authorized representative who has indicated his/her understanding and acceptance.   Dental Advisory Given  Plan Discussed with: CRNA and Surgeon  Anesthesia Plan Comments:         Anesthesia Quick Evaluation

## 2016-04-11 ENCOUNTER — Other Ambulatory Visit (HOSPITAL_COMMUNITY): Payer: Self-pay | Admitting: *Deleted

## 2016-04-11 NOTE — Progress Notes (Signed)
EKG 03-27-16 EPIC EKG 03-27-16 EPIC ANESTHESIA RECORDS 04-01-16 [place] ON CHART EKG 02-25-16 [place] ON CHART

## 2016-04-11 NOTE — Patient Instructions (Addendum)
Eric Suarez  04/11/2016   Your procedure is scheduled on: 04-18-16  Report to [facility]  Entrance take [facility]  elevators to 3rd floor to  [place] at 515 AM.  Call this number if you have problems the morning of surgery [phone]   Remember: ONLY 1 PERSON MAY GO WITH YOU TO SHORT STAY TO GET  READY MORNING OF [place].  Do not eat food After Midnight Tuesday NIGHT, CLEAR LIQUIDS ALL DAY 04-17-16 PER DR BORDEN, FOLLOW ALL BOWEL PREP INSTRUCTIONS FROM DR Alinda Money, NOTHING BY MOUTH AFTER MIDNIGHT Wednesday NIGHT.      Take these medicines the morning of surgery with A SIP OF WATER: PAROXETINE (PAXIL), SYNTHROID,                               You may not have any metal on your body including hair pins and              piercings  Do not wear jewelry, make-up, lotions, powders or perfumes, deodorant             Do not wear nail polish.  Do not shave  48 hours prior to surgery.              Men may shave face and neck.   Do not bring valuables to the hospital. [place].  Contacts, dentures or bridgework may not be worn into surgery.  Leave suitcase in the car. After surgery it may be brought to your room.     r driver:  Special Instructions: N/A              Please read over the following fact sheets you were given: _____________________________________________________________________                CLEAR LIQUID DIET   Foods Allowed                                                                     Foods Excluded  Coffee and tea, regular and decaf                             liquids that you cannot  Plain Jell-O in any flavor                                             see through such as: Fruit ices (not with fruit pulp)                                     milk, soups, orange juice  Iced Popsicles  All solid food Carbonated beverages, regular and diet                                     Cranberry, grape and apple juices Sports drinks like Gatorade Lightly seasoned clear broth or consume(fat free) Sugar, honey syrup  Sample Menu Breakfast                                Lunch                                     Supper Cranberry juice                    Beef broth                            Chicken broth Jell-O                                     Grape juice                           Apple juice Coffee or tea                        Jell-O                                      Popsicle                                                Coffee or tea                        Coffee or tea  _____________________________________________________________________  [facility] - Preparing for Surgery Before surgery, you can play an important role.  Because skin is not sterile, your skin needs to be as free of germs as possible.  You can reduce the number of germs on your skin by washing with CHG (chlorahexidine gluconate) soap before surgery.  CHG is an antiseptic cleaner which kills germs and bonds with the skin to continue killing germs even after washing. Please DO NOT use if you have an allergy to CHG or antibacterial soaps.  If your skin becomes reddened/irritated stop using the CHG and inform your nurse when you arrive at Short Stay. Do not shave (including legs and underarms) for at least 48 hours prior to the first CHG shower.  You may shave your face/neck. Please follow these instructions carefully:  1.  Shower with CHG Soap the night before surgery and the  morning of Surgery.  2.  If you choose to wash your hair, wash your hair first as usual with your  normal  shampoo.  3.  After you shampoo, rinse your hair and body thoroughly to remove the  shampoo.  4.  Use CHG as you would any other liquid soap.  You can apply chg directly  to the skin and wash                       Gently with a scrungie or clean washcloth.  5.   Apply the CHG Soap to your body ONLY FROM THE NECK DOWN.   Do not use on face/ open                           Wound or open sores. Avoid contact with eyes, ears mouth and genitals (private parts).                       Wash face,  Genitals (private parts) with your normal soap.             6.  Wash thoroughly, paying special attention to the area where your surgery  will be performed.  7.  Thoroughly rinse your body with warm water from the neck down.  8.  DO NOT shower/wash with your normal soap after using and rinsing off  the CHG Soap.                9.  Pat yourself dry with a clean towel.            10.  Wear clean pajamas.            11.  Place clean sheets on your bed the night of your first shower and do not  sleep with pets. Day of Surgery : Do not apply any lotions/deodorants the morning of surgery.  Please wear clean clothes to the hospital/surgery center.  FAILURE TO FOLLOW THESE INSTRUCTIONS MAY RESULT IN THE CANCELLATION OF YOUR SURGERY PATIENT SIGNATURE_________________________________  NURSE SIGNATURE__________________________________  ________________________________________________________________________  WHAT IS A BLOOD TRANSFUSION? Blood Transfusion Information  A transfusion is the replacement of blood or some of its parts. Blood is made up of multiple cells which provide different functions.  Red blood cells carry oxygen and are used for blood loss replacement.  White blood cells fight against infection.  Platelets control bleeding.  Plasma helps clot blood.  Other blood products are available for specialized needs, such as hemophilia or other clotting disorders. BEFORE THE TRANSFUSION  Who gives blood for transfusions?   Healthy volunteers who are fully evaluated to make sure their blood is safe. This is blood bank blood. Transfusion therapy is the safest it has ever been in the practice of medicine. Before blood is taken from a donor, a complete history is  taken to make sure that person has no history of diseases nor engages in risky social behavior (examples are intravenous drug use or sexual activity with multiple partners). The donor's travel history is screened to minimize risk of transmitting infections, such as malaria. The donated blood is tested for signs of infectious diseases, such as HIV and hepatitis. The blood is then tested to be sure it is compatible with you in order to minimize the chance of a transfusion reaction. If you or a relative donates blood, this is often done in anticipation of surgery and is not appropriate for emergency situations. It takes many days to process the donated blood. RISKS AND COMPLICATIONS Although transfusion therapy is very safe and saves many lives, the main dangers of transfusion include:   Getting an infectious disease.  Developing a transfusion reaction.  This is an allergic reaction to something in the blood you were given. Every precaution is taken to prevent this. The decision to have a blood transfusion has been considered carefully by your caregiver before blood is given. Blood is not given unless the benefits outweigh the risks. AFTER THE TRANSFUSION  Right after receiving a blood transfusion, you will usually feel much better and more energetic. This is especially true if your red blood cells have gotten low (anemic). The transfusion raises the level of the red blood cells which carry oxygen, and this usually causes an energy increase.  The nurse administering the transfusion will monitor you carefully for complications. HOME CARE INSTRUCTIONS  No special instructions are needed after a transfusion. You may find your energy is better. Speak with your caregiver about any limitations on activity for underlying diseases you may have. SEEK MEDICAL CARE IF:   Your condition is not improving after your transfusion.  You develop redness or irritation at the intravenous (IV) site. SEEK IMMEDIATE  MEDICAL CARE IF:  Any of the following symptoms occur over the next 12 hours:  Shaking chills.  You have a temperature by mouth above 102 F (38.9 C), not controlled by medicine.  Chest, back, or muscle pain.  People around you feel you are not acting correctly or are confused.  Shortness of breath or difficulty breathing.  Dizziness and fainting.  You get a rash or develop hives.  You have a decrease in urine output.  Your urine turns a dark color or changes to pink, red, or brown. Any of the following symptoms occur over the next 10 days:  You have a temperature by mouth above 102 F (38.9 C), not controlled by medicine.  Shortness of breath.  Weakness after normal activity.  The white part of the eye turns yellow (jaundice).  You have a decrease in the amount of urine or are urinating less often.  Your urine turns a dark color or changes to pink, red, or brown. Document Released: 10/25/2000 Document Revised: 01/20/2012 Document Reviewed: 06/13/2008 [facility] Patient Information 2014 [place], [place].  _______________________________________________________________________

## 2016-04-15 ENCOUNTER — Encounter (HOSPITAL_COMMUNITY): Payer: Self-pay

## 2016-04-15 ENCOUNTER — Other Ambulatory Visit: Payer: Self-pay | Admitting: Urology

## 2016-04-15 ENCOUNTER — Encounter (HOSPITAL_COMMUNITY)
Admission: RE | Admit: 2016-04-15 | Discharge: 2016-04-15 | Disposition: A | Discharge status: Home / Self Care | Payer: PPO | Source: Ambulatory Visit | Attending: Urology | Admitting: Urology

## 2016-04-15 DIAGNOSIS — Z85850 Personal history of malignant neoplasm of thyroid: Secondary | ICD-10-CM | POA: Diagnosis not present

## 2016-04-15 DIAGNOSIS — Z9049 Acquired absence of other specified parts of digestive tract: Secondary | ICD-10-CM | POA: Diagnosis not present

## 2016-04-15 DIAGNOSIS — R972 Elevated prostate specific antigen [PSA]: Secondary | ICD-10-CM | POA: Diagnosis not present

## 2016-04-15 DIAGNOSIS — C642 Malignant neoplasm of left kidney, except renal pelvis: Secondary | ICD-10-CM | POA: Diagnosis not present

## 2016-04-15 DIAGNOSIS — D49512 Neoplasm of unspecified behavior of left kidney: Secondary | ICD-10-CM | POA: Diagnosis not present

## 2016-04-15 DIAGNOSIS — N400 Enlarged prostate without lower urinary tract symptoms: Secondary | ICD-10-CM | POA: Diagnosis not present

## 2016-04-15 DIAGNOSIS — I10 Essential (primary) hypertension: Secondary | ICD-10-CM | POA: Diagnosis not present

## 2016-04-15 DIAGNOSIS — E785 Hyperlipidemia, unspecified: Secondary | ICD-10-CM | POA: Diagnosis not present

## 2016-04-15 DIAGNOSIS — Z79899 Other long term (current) drug therapy: Secondary | ICD-10-CM | POA: Diagnosis not present

## 2016-04-15 DIAGNOSIS — Z8249 Family history of ischemic heart disease and other diseases of the circulatory system: Secondary | ICD-10-CM | POA: Diagnosis not present

## 2016-04-15 DIAGNOSIS — Z882 Allergy status to sulfonamides status: Secondary | ICD-10-CM | POA: Diagnosis not present

## 2016-04-15 HISTORY — DX: Other specified disorders of kidney and ureter: N28.89

## 2016-04-15 HISTORY — DX: Malignant (primary) neoplasm, unspecified: C80.1

## 2016-04-15 LAB — BASIC METABOLIC PANEL
ANION GAP: 5 (ref 5–15)
BUN: 24 mg/dL — AB (ref 6–20)
CALCIUM: 9.3 mg/dL (ref 8.9–10.3)
CO2: 25 mmol/L (ref 22–32)
Chloride: 108 mmol/L (ref 101–111)
Creatinine, Ser: 1.34 mg/dL — ABNORMAL HIGH (ref 0.61–1.24)
GFR calc Af Amer: >60 mL/min (ref >60)
GFR, EST NON AFRICAN AMERICAN: 52 mL/min — AB (ref >60)
Glucose, Bld: 121 mg/dL — ABNORMAL HIGH (ref 65–99)
POTASSIUM: 4.7 mmol/L (ref 3.5–5.1)
SODIUM: 138 mmol/L (ref 135–145)

## 2016-04-15 LAB — CBC
HEMATOCRIT: 39.2 % (ref 39.0–52.0)
Hemoglobin: 13.4 g/dL (ref 13.0–17.0)
MCH: 30.5 pg (ref 26.0–34.0)
MCHC: 34.2 g/dL (ref 30.0–36.0)
MCV: 89.1 fL (ref 78.0–100.0)
Platelets: 171 10*3/uL (ref 150–400)
RBC: 4.4 MIL/uL (ref 4.22–5.81)
RDW: 14.2 % (ref 11.5–15.5)
WBC: 10 10*3/uL (ref 4.0–10.5)

## 2016-04-15 LAB — ABO/RH: ABO/RH(D): O POS

## 2016-04-15 NOTE — Progress Notes (Signed)
Chest xray 03-27-16 epic

## 2016-04-17 NOTE — H&P (Signed)
Chief Complaint Left renal neoplasm   History of Present Illness Eric Suarez is a pleasant 70 year old gentleman seen today at the request of Dr. Eda Keys for a left renal neoplasm. Approximately 2 weeks ago, he presented to the emergency department at [facility] in [place]. He presented with severe abdominal pain which prompted a CT scan of the chest, abdomen, and pelvis with contrast. He was noted to have a large incarcerated left inguinal hernia as the source of his pain. He underwent surgical treatment including a left open inguinal hernia repair and subsequent exploratory laparoscopy to ensure that his bowel remained viable. He ultimately did not require any surgery on his bowel. Incidentally, he was noted to have a few small bilateral pulmonary nodules. He also was noted to have a 4.7 cm heterogeneous appearing upper pole left renal mass. This mass did appear hyperdense raising concern for enhancement although no noncontrasted scan was performed. This mass also appeared consistent with a renal cell carcinoma although was completely endophytic and does raise a question that it could represent a urothelial carcinoma although delayed images did not strongly suggest this. He has denied any hematuria or flank pain. He denies any unintentional weight loss, night sweats, or poor appetite. He has been healing well from his hernia repair and has no specific complaints today.    He has been followed by Dr. Junious Silk in the past for a history of an elevated PSA. He had previously undergone a prostate biopsy by Dr. Serita Butcher when his PSA was around 5 in the past. His more recent PSA levels have been in the mid 3 range. He also has a history of BPH although has not required medical therapy. He has wife have actively on and worked for their own [employer] until the spring when he retired after his recent hospitalization.    He has no family history of kidney cancer or end-stage renal  disease.   Past Medical History Problems  1. History of Arthritis 2. History of acute prostatitis (Z87.438) 3. History of heartburn (Z87.898) 4. History of hypertension (Z86.79) 5. History of Thyroid Cancer  He has a history of thyroid cancer status post surgical removal in 1989. He has had no evidence of recurrence.   Surgical History Problems  1. History of Appendectomy 2. History of Exploratory Laparoscopy 3. History of Hemorrhoidectomy 4. History of Hernia Repair 5. History of Thyroid Surgery  His prior surgeries include a history of a right lower quadrant incision for an appendectomy, multiple bilateral open inguinal hernia repairs including his most recent repair on the left side which was his third on that side. He also has undergone exploratory laparoscopy stated above.   Current Meds 1. Atenolol 50 MG Oral Tablet;  Therapy: (Recorded:18Jan2008) to Recorded 2. Benicar TABS (Olmesartan Medoxomil);  Therapy: ([id number]) to Recorded 3. Chlorthalidone 25 MG Oral Tablet; TAKE 1 TABLET DAILY AS NEEDED FOR BLOOD  PRESSURE;  Therapy: 12Sep2012 to Recorded 4. Fish Oil CAPS;  Therapy: (Recorded:18Jan2008) to Recorded 5. HydroCHLOROthiazide 25 MG Oral Tablet;  Therapy: (Recorded:18Jan2008) to Recorded 6. Klor-Con M20 20 MEQ Oral Tablet Extended Release;  Therapy: [id number] to Recorded 7. Prevacid 15 MG Oral Capsule Delayed Release (Lansoprazole);  Therapy: (Recorded:18Jan2008) to Recorded 8. Synthroid TABS (Levothyroxine Sodium);  Therapy: (Recorded:18Jan2008) to Recorded  Allergies Medication  1. Sulfa Drugs  Family History Problems  1. Family history of Acute Myocardial Infarction : Mother 2. Family history of Death In The Family Father   Deceased at age 59 3. Family history  of Death In The Family Mother   Deceased at age 27; MI 16. Family history of Family Health Status Number Of Children   1 son 5. Family history of Hypertension : Father 25. Family  history of Hypertension : Mother 48. Family history of Hypertension : Brother 18. Family history of Nephrolithiasis  Social History Problems    Caffeine Use   1   Marital History - Currently Married   Never A Smoker   Occupation:   Development worker, international [employer]-  Review of Systems Genitourinary, constitutional, skin, eye, otolaryngeal, hematologic/lymphatic, cardiovascular, pulmonary, endocrine, musculoskeletal, gastrointestinal, neurological and psychiatric system(s) were reviewed and pertinent findings if present are noted and are otherwise negative.  Genitourinary: no hematuria.  Constitutional: no fever, no night sweats and no recent weight loss.  Cardiovascular: no leg swelling.      Physical Exam Constitutional: Well nourished and well developed . No acute distress.  ENT:. The ears and nose are normal in appearance.  Neck: The appearance of the neck is normal and no neck mass is present.  Pulmonary: No respiratory distress, normal respiratory rhythm and effort and clear bilateral breath sounds.  Cardiovascular: Heart rate and rhythm are normal . No peripheral edema.  Abdomen: right lower quadrant, periumbilical, left inguinal, right inguinal incision site(s) well healed. The abdomen is soft and nontender. No masses are palpated. No CVA tenderness. No hernias are palpable. No hepatosplenomegaly noted.  Lymphatics: The supraclavicular, femoral and inguinal nodes are not enlarged or tender.  Skin: Normal skin turgor, no visible rash and no visible skin lesions.  Neuro/Psych:. Mood and affect are appropriate.      Assessment Assessed  1. Neoplasm of left kidney [id number])  Plan Health Maintenance  1. UA With REFLEX; [Do Not Release]; Status:Complete;   [id number] 10:56AM Neoplasm of left kidney  2. CBC w/PLT NO DIFF; Status:In Progress - Specimen/Data Collected;   Done: [id number] 3. CMP with Estimated GFR; Status:In Progress - Specimen/Data Collected;   Done:  [id number] 4. CT-ABD  W/W/O CONTRAST; Status:Hold For - Appointment,PreCert,Date of Service,Print;  Requested for:02May2017;  5. VENIPUNCTURE; Status:Complete;   [id number] 6. Follow-up Office  Follow-up  Status: Hold For - Appointment,Date of Service  Requested  for: [id number]  Discussion/Summary 1. Left renal neoplasm concerning for malignancy: He will undergo a left laparoscopic radical nephrectomy. Verified Results CT-ABD W/W/O CONTRAST1 [id number] 12:00AM1 Read Drivers  [facility] 9, 2017 1:41PM Alinda Money, Kohler [place]ito] Please let Eric Suarez know that I reviewed his CT scan. His mass remains concerning for a kidney cancer and we should proceed as planned with his upcoming procedures which are scheduled.   Test Name Result Flag Reference  CT-ABD W/W/O CONTRAST1 (Report)1    ** RADIOLOGY REPORT BY [facility] **   CLINICAL DATA: Left renal mass seen on a prior CT scan from 02/25/2016.  EXAM: CT ABDOMEN WITHOUT AND WITH CONTRAST  TECHNIQUE: Multidetector CT imaging of the abdomen was performed following the standard protocol before and following the bolus administration of intravenous contrast.  CONTRAST: 125 cc Isovue 300  COMPARISON: 02/25/2016 CT scan  FINDINGS: Lower chest: Stable scarring in the right lower lobe and subsegmental atelectasis in the left lower lobe. Stable nodules in the left lower lobe including a 5 mm subpleural nodule on image 16/7  Hepatobiliary: 4 mm hypodense lesion posteriorly in the right hepatic lobe on image 44/6, too small to characterize, probably present on 08/30/2009 and so highly likely to be benign. Gallbladder unremarkable.  Pancreas: Unremarkable  Spleen: Upper normal splenic size.  Adrenals/Urinary Tract: 4.7 by 4.7 cm mass in the left splenic hilum on image 65/6, by my measurements previously 4.7 by 4.6 cm at this level on the prior exam from 3 weeks ago, extending up to involve the upper pole of the kidney and partially exophytic from the  upper pole. Multiple thick septations and cystic elements in this mass, compatible with renal cell carcinoma. There is some small hypodense lesions in the right kidney which are probably cysts. I do not observe any tumor thrombus in the left renal vein. No obvious periaortic adenopathy. The mass has a considerable degree of septal arterial phase enhancement. No associated calcifications.  Stomach/Bowel: Small type 1 hiatal hernia.  Vascular/Lymphatic: 10 mm peripancreatic lymph node borderline enlarged image 47/6. Aortoiliac atherosclerotic vascular disease.  Other: No supplemental non-categorized findings.  Musculoskeletal: Endplate sclerosis and degenerative disc disease at L2-3. Probable Schmorl's node along the superior endplate of L3.  Small umbilical hernia contains adipose tissue.  IMPRESSION: 1. 4.7 cm mass extending from the upper left renal hilum through the upper pole of the kidney, compatible with renal cell carcinoma. No tumor thrombus in the left renal vein or adenopathy identified. No change in size based on my measurements compared to the prior exam from 3 weeks ago. 2. Stable left lower lobe nodules including a 5 mm subpleural nodule on image 16/7. 3. Small type 1 hiatal hernia.   Electronically Signed  By: Van Clines M.D.  On: 03/18/2016 16:13     1. Amended By: Raynelle Bring; Mar 19 2016 1:41 PM EST  Signatures Electronically signed by : Raynelle Bring, M.D.; Mar 19 2016  1:41PM EST

## 2016-04-18 ENCOUNTER — Encounter (HOSPITAL_COMMUNITY): Payer: Self-pay | Admitting: *Deleted

## 2016-04-18 ENCOUNTER — Inpatient Hospital Stay (HOSPITAL_COMMUNITY)
Admission: RE | Admit: 2016-04-18 | Discharge: 2016-04-19 | DRG: 658 | Disposition: A | Discharge status: Home / Self Care | Payer: PPO | Source: Ambulatory Visit | Attending: Urology | Admitting: Urology

## 2016-04-18 ENCOUNTER — Encounter (HOSPITAL_COMMUNITY)
Admission: RE | Disposition: A | Discharge status: Home / Self Care | Payer: Self-pay | Source: Ambulatory Visit | Attending: Urology

## 2016-04-18 ENCOUNTER — Inpatient Hospital Stay (HOSPITAL_COMMUNITY): Payer: PPO | Admitting: Certified Registered Nurse Anesthetist

## 2016-04-18 DIAGNOSIS — C642 Malignant neoplasm of left kidney, except renal pelvis: Principal | ICD-10-CM | POA: Diagnosis present

## 2016-04-18 DIAGNOSIS — Z882 Allergy status to sulfonamides status: Secondary | ICD-10-CM

## 2016-04-18 DIAGNOSIS — D49512 Neoplasm of unspecified behavior of left kidney: Secondary | ICD-10-CM | POA: Diagnosis not present

## 2016-04-18 DIAGNOSIS — Z85850 Personal history of malignant neoplasm of thyroid: Secondary | ICD-10-CM | POA: Diagnosis not present

## 2016-04-18 DIAGNOSIS — N400 Enlarged prostate without lower urinary tract symptoms: Secondary | ICD-10-CM | POA: Diagnosis present

## 2016-04-18 DIAGNOSIS — R972 Elevated prostate specific antigen [PSA]: Secondary | ICD-10-CM | POA: Diagnosis not present

## 2016-04-18 DIAGNOSIS — Z79899 Other long term (current) drug therapy: Secondary | ICD-10-CM | POA: Diagnosis not present

## 2016-04-18 DIAGNOSIS — Z8249 Family history of ischemic heart disease and other diseases of the circulatory system: Secondary | ICD-10-CM

## 2016-04-18 DIAGNOSIS — E785 Hyperlipidemia, unspecified: Secondary | ICD-10-CM | POA: Diagnosis present

## 2016-04-18 DIAGNOSIS — Z9049 Acquired absence of other specified parts of digestive tract: Secondary | ICD-10-CM | POA: Diagnosis not present

## 2016-04-18 DIAGNOSIS — I10 Essential (primary) hypertension: Secondary | ICD-10-CM | POA: Diagnosis not present

## 2016-04-18 HISTORY — PX: LAPAROSCOPIC NEPHRECTOMY: SHX1930

## 2016-04-18 LAB — BASIC METABOLIC PANEL
ANION GAP: 7 (ref 5–15)
BUN: 24 mg/dL — ABNORMAL HIGH (ref 6–20)
CALCIUM: 8.4 mg/dL — AB (ref 8.9–10.3)
CO2: 22 mmol/L (ref 22–32)
Chloride: 104 mmol/L (ref 101–111)
Creatinine, Ser: 1.54 mg/dL — ABNORMAL HIGH (ref 0.61–1.24)
GFR, EST AFRICAN AMERICAN: 51 mL/min — AB (ref >60)
GFR, EST NON AFRICAN AMERICAN: 44 mL/min — AB (ref >60)
Glucose, Bld: 179 mg/dL — ABNORMAL HIGH (ref 65–99)
Potassium: 4.2 mmol/L (ref 3.5–5.1)
Sodium: 133 mmol/L — ABNORMAL LOW (ref 135–145)

## 2016-04-18 LAB — TYPE AND SCREEN
ABO/RH(D): O POS
ANTIBODY SCREEN: NEGATIVE

## 2016-04-18 LAB — HEMOGLOBIN AND HEMATOCRIT, BLOOD
HEMATOCRIT: 36.2 % — AB (ref 39.0–52.0)
Hemoglobin: 12.7 g/dL — ABNORMAL LOW (ref 13.0–17.0)

## 2016-04-18 LAB — MRSA PCR SCREENING: MRSA BY PCR: NEGATIVE

## 2016-04-18 SURGERY — NEPHRECTOMY, RADICAL, LAPAROSCOPIC, ADULT
Anesthesia: General | Laterality: Left

## 2016-04-18 MED ORDER — ACETAMINOPHEN 10 MG/ML IV SOLN
1000.0000 mg | Freq: Four times a day (QID) | INTRAVENOUS | Status: DC
  Administered 2016-04-18 – 2016-04-19 (×3): 1000 mg via INTRAVENOUS
  Filled 2016-04-18 (×4): qty 100

## 2016-04-18 MED ORDER — PROPOFOL 10 MG/ML IV BOLUS
INTRAVENOUS | Status: AC
  Filled 2016-04-18: qty 20

## 2016-04-18 MED ORDER — HYDROCODONE-ACETAMINOPHEN 5-325 MG PO TABS: 1.0000 | ORAL_TABLET | Freq: Four times a day (QID) | ORAL | Status: DC | PRN

## 2016-04-18 MED ORDER — SUGAMMADEX SODIUM 200 MG/2ML IV SOLN
INTRAVENOUS | Status: DC | PRN
  Administered 2016-04-18: 200 mg via INTRAVENOUS

## 2016-04-18 MED ORDER — HYDROMORPHONE HCL 1 MG/ML IJ SOLN
0.5000 mg | INTRAMUSCULAR | Status: DC | PRN
  Administered 2016-04-18 – 2016-04-19 (×3): 1 mg via INTRAVENOUS
  Filled 2016-04-18 (×3): qty 1

## 2016-04-18 MED ORDER — EPHEDRINE SULFATE 50 MG/ML IJ SOLN
INTRAMUSCULAR | Status: AC
  Filled 2016-04-18: qty 1

## 2016-04-18 MED ORDER — ONDANSETRON HCL 4 MG/2ML IJ SOLN: 4.0000 mg | INTRAMUSCULAR | Status: DC | PRN

## 2016-04-18 MED ORDER — ROCURONIUM BROMIDE 100 MG/10ML IV SOLN
INTRAVENOUS | Status: DC | PRN
  Administered 2016-04-18: 10 mg via INTRAVENOUS
  Administered 2016-04-18: 20 mg via INTRAVENOUS
  Administered 2016-04-18: 10 mg via INTRAVENOUS
  Administered 2016-04-18: 50 mg via INTRAVENOUS

## 2016-04-18 MED ORDER — ONDANSETRON HCL 4 MG/2ML IJ SOLN
INTRAMUSCULAR | Status: DC | PRN
Start: 2016-04-18 — End: 2016-04-18
  Administered 2016-04-18: 4 mg via INTRAVENOUS

## 2016-04-18 MED ORDER — PANTOPRAZOLE SODIUM 20 MG PO TBEC
20.0000 mg | DELAYED_RELEASE_TABLET | Freq: Every day | ORAL | Status: DC
  Administered 2016-04-18 – 2016-04-19 (×2): 20 mg via ORAL
  Filled 2016-04-18 (×3): qty 1

## 2016-04-18 MED ORDER — ATENOLOL 50 MG PO TABS
50.0000 mg | ORAL_TABLET | Freq: Every day | ORAL | Status: DC
  Administered 2016-04-19: 50 mg via ORAL
  Filled 2016-04-18: qty 1

## 2016-04-18 MED ORDER — SUGAMMADEX SODIUM 200 MG/2ML IV SOLN
INTRAVENOUS | Status: AC
  Filled 2016-04-18: qty 2

## 2016-04-18 MED ORDER — SODIUM CHLORIDE 0.9 % IJ SOLN
INTRAMUSCULAR | Status: AC
  Filled 2016-04-18: qty 10

## 2016-04-18 MED ORDER — HYDROMORPHONE HCL 1 MG/ML IJ SOLN
0.2500 mg | INTRAMUSCULAR | Status: DC | PRN
  Administered 2016-04-18: 0.5 mg via INTRAVENOUS
  Administered 2016-04-18 (×4): 0.25 mg via INTRAVENOUS
  Administered 2016-04-18: 0.5 mg via INTRAVENOUS

## 2016-04-18 MED ORDER — ALUM & MAG HYDROXIDE-SIMETH 200-200-20 MG/5ML PO SUSP
30.0000 mL | Freq: Four times a day (QID) | ORAL | Status: DC | PRN
  Administered 2016-04-18: 30 mL via ORAL
  Filled 2016-04-18: qty 30

## 2016-04-18 MED ORDER — ROCURONIUM BROMIDE 100 MG/10ML IV SOLN
INTRAVENOUS | Status: AC
  Filled 2016-04-18: qty 1

## 2016-04-18 MED ORDER — BUPIVACAINE LIPOSOME 1.3 % IJ SUSP
20.0000 mL | Freq: Once | INTRAMUSCULAR | Status: DC
  Filled 2016-04-18: qty 20

## 2016-04-18 MED ORDER — MIDAZOLAM HCL 5 MG/5ML IJ SOLN
INTRAMUSCULAR | Status: DC | PRN
  Administered 2016-04-18: 2 mg via INTRAVENOUS

## 2016-04-18 MED ORDER — BUPIVACAINE-EPINEPHRINE (PF) 0.25% -1:200000 IJ SOLN
INTRAMUSCULAR | Status: AC
  Filled 2016-04-18: qty 30

## 2016-04-18 MED ORDER — CHLORTHALIDONE 25 MG PO TABS
25.0000 mg | ORAL_TABLET | Freq: Every day | ORAL | Status: DC
  Administered 2016-04-19: 25 mg via ORAL
  Filled 2016-04-18 (×2): qty 1

## 2016-04-18 MED ORDER — LIDOCAINE HCL (CARDIAC) 20 MG/ML IV SOLN
INTRAVENOUS | Status: DC | PRN
  Administered 2016-04-18: 100 mg via INTRAVENOUS

## 2016-04-18 MED ORDER — DOCUSATE SODIUM 100 MG PO CAPS
100.0000 mg | ORAL_CAPSULE | Freq: Two times a day (BID) | ORAL | Status: DC
  Administered 2016-04-18 – 2016-04-19 (×2): 100 mg via ORAL
  Filled 2016-04-18 (×2): qty 1

## 2016-04-18 MED ORDER — LIDOCAINE HCL (CARDIAC) 20 MG/ML IV SOLN
INTRAVENOUS | Status: AC
  Filled 2016-04-18: qty 5

## 2016-04-18 MED ORDER — LACTATED RINGERS IV SOLN
INTRAVENOUS | Status: DC | PRN
  Administered 2016-04-18 (×2): via INTRAVENOUS

## 2016-04-18 MED ORDER — MEPERIDINE HCL 50 MG/ML IJ SOLN: 6.2500 mg | INTRAMUSCULAR | Status: DC | PRN

## 2016-04-18 MED ORDER — HYDROMORPHONE HCL 1 MG/ML IJ SOLN
INTRAMUSCULAR | Status: AC
  Filled 2016-04-18: qty 1

## 2016-04-18 MED ORDER — DEXAMETHASONE SODIUM PHOSPHATE 10 MG/ML IJ SOLN
INTRAMUSCULAR | Status: DC | PRN
  Administered 2016-04-18: 10 mg via INTRAVENOUS

## 2016-04-18 MED ORDER — DIPHENHYDRAMINE HCL 50 MG/ML IJ SOLN: 12.5000 mg | Freq: Four times a day (QID) | INTRAMUSCULAR | Status: DC | PRN

## 2016-04-18 MED ORDER — BOOST / RESOURCE BREEZE PO LIQD
1.0000 | Freq: Three times a day (TID) | ORAL | Status: DC
  Administered 2016-04-18: 1 via ORAL

## 2016-04-18 MED ORDER — ATORVASTATIN CALCIUM 10 MG PO TABS
20.0000 mg | ORAL_TABLET | Freq: Every day | ORAL | Status: DC
  Administered 2016-04-18: 20 mg via ORAL
  Filled 2016-04-18: qty 2

## 2016-04-18 MED ORDER — FENTANYL CITRATE (PF) 100 MCG/2ML IJ SOLN
INTRAMUSCULAR | Status: DC | PRN
  Administered 2016-04-18: 50 ug via INTRAVENOUS
  Administered 2016-04-18: 100 ug via INTRAVENOUS
  Administered 2016-04-18 (×4): 50 ug via INTRAVENOUS
  Administered 2016-04-18: 150 ug via INTRAVENOUS

## 2016-04-18 MED ORDER — DEXTROSE-NACL 5-0.45 % IV SOLN
INTRAVENOUS | Status: DC
  Administered 2016-04-18 – 2016-04-19 (×4): via INTRAVENOUS

## 2016-04-18 MED ORDER — PAROXETINE HCL ER 25 MG PO TB24
25.0000 mg | ORAL_TABLET | Freq: Every day | ORAL | Status: DC
  Administered 2016-04-19: 25 mg via ORAL
  Filled 2016-04-18: qty 1

## 2016-04-18 MED ORDER — PROPOFOL 10 MG/ML IV BOLUS
INTRAVENOUS | Status: DC | PRN
  Administered 2016-04-18: 130 mg via INTRAVENOUS

## 2016-04-18 MED ORDER — BUPIVACAINE-EPINEPHRINE 0.25% -1:200000 IJ SOLN
INTRAMUSCULAR | Status: DC | PRN
  Administered 2016-04-18: 3 mL

## 2016-04-18 MED ORDER — CEFAZOLIN SODIUM 1-5 GM-% IV SOLN
1.0000 g | Freq: Three times a day (TID) | INTRAVENOUS | Status: AC
  Administered 2016-04-18 (×2): 1 g via INTRAVENOUS
  Filled 2016-04-18 (×2): qty 50

## 2016-04-18 MED ORDER — BUPIVACAINE LIPOSOME 1.3 % IJ SUSP
INTRAMUSCULAR | Status: DC | PRN
  Administered 2016-04-18: 20 mL

## 2016-04-18 MED ORDER — CEFAZOLIN SODIUM-DEXTROSE 2-4 GM/100ML-% IV SOLN
INTRAVENOUS | Status: AC
  Filled 2016-04-18: qty 100

## 2016-04-18 MED ORDER — LEVOTHYROXINE SODIUM 125 MCG PO TABS
125.0000 ug | ORAL_TABLET | Freq: Every day | ORAL | Status: DC
  Administered 2016-04-19: 125 ug via ORAL
  Filled 2016-04-18 (×2): qty 1

## 2016-04-18 MED ORDER — DIPHENHYDRAMINE HCL 12.5 MG/5ML PO ELIX: 12.5000 mg | ORAL_SOLUTION | Freq: Four times a day (QID) | ORAL | Status: DC | PRN

## 2016-04-18 MED ORDER — ATENOLOL 50 MG PO TABS
50.0000 mg | ORAL_TABLET | Freq: Once | ORAL | Status: AC
  Administered 2016-04-18: 50 mg via ORAL
  Filled 2016-04-18: qty 1

## 2016-04-18 MED ORDER — FENTANYL CITRATE (PF) 250 MCG/5ML IJ SOLN
INTRAMUSCULAR | Status: AC
  Filled 2016-04-18: qty 5

## 2016-04-18 MED ORDER — ONDANSETRON HCL 4 MG/2ML IJ SOLN
INTRAMUSCULAR | Status: AC
  Filled 2016-04-18: qty 2

## 2016-04-18 MED ORDER — CEFAZOLIN SODIUM-DEXTROSE 2-3 GM-% IV SOLR
2.0000 g | Freq: Once | INTRAVENOUS | Status: AC
  Administered 2016-04-18: 2 g via INTRAVENOUS

## 2016-04-18 MED ORDER — ONDANSETRON HCL 4 MG/2ML IJ SOLN: 4.0000 mg | Freq: Once | INTRAMUSCULAR | Status: DC | PRN

## 2016-04-18 MED ORDER — DEXAMETHASONE SODIUM PHOSPHATE 10 MG/ML IJ SOLN
INTRAMUSCULAR | Status: AC
  Filled 2016-04-18: qty 1

## 2016-04-18 MED ORDER — MIDAZOLAM HCL 2 MG/2ML IJ SOLN
INTRAMUSCULAR | Status: AC
  Filled 2016-04-18: qty 2

## 2016-04-18 MED ORDER — EPHEDRINE SULFATE 50 MG/ML IJ SOLN
INTRAMUSCULAR | Status: DC | PRN
  Administered 2016-04-18: 5 mg via INTRAVENOUS
  Administered 2016-04-18: 10 mg via INTRAVENOUS
  Administered 2016-04-18: 5 mg via INTRAVENOUS
  Administered 2016-04-18 (×3): 10 mg via INTRAVENOUS

## 2016-04-18 MED ORDER — ATENOLOL-CHLORTHALIDONE 50-25 MG PO TABS: 1.0000 | ORAL_TABLET | Freq: Every day | ORAL | Status: DC

## 2016-04-18 SURGICAL SUPPLY — 60 items
BAG SPEC RTRVL LRG 6X4 10 (ENDOMECHANICALS)
BAG SPEC THK2 15X12 ZIP CLS (MISCELLANEOUS)
BAG ZIPLOCK 12X15 (MISCELLANEOUS) ×1 IMPLANT
BLADE EXTENDED COATED 6.5IN (ELECTRODE) IMPLANT
BLADE SURG SZ10 CARB STEEL (BLADE) IMPLANT
CATH FOLEY 3WAY  5CC 18FR (CATHETERS)
CATH FOLEY 3WAY 5CC 18FR (CATHETERS) ×1 IMPLANT
CHLORAPREP W/TINT 26ML (MISCELLANEOUS) ×3 IMPLANT
CLIP LIGATING HEM O LOK PURPLE (MISCELLANEOUS) ×3 IMPLANT
CLIP LIGATING HEMO LOK XL GOLD (MISCELLANEOUS) ×2 IMPLANT
CLIP LIGATING HEMO O LOK GREEN (MISCELLANEOUS) ×3 IMPLANT
COVER SURGICAL LIGHT HANDLE (MISCELLANEOUS) ×3 IMPLANT
CUTTER FLEX LINEAR 45M (STAPLE) ×2 IMPLANT
DRAIN CHANNEL 10F 3/8 F FF (DRAIN) ×2 IMPLANT
DRAPE INCISE IOBAN 66X45 STRL (DRAPES) ×3 IMPLANT
DRAPE LAPAROSCOPIC ABDOMINAL (DRAPES) ×1 IMPLANT
DRAPE WARM FLUID 44X44 (DRAPE) IMPLANT
DRSG TEGADERM 4X4.75 (GAUZE/BANDAGES/DRESSINGS) ×3 IMPLANT
ELECT PENCIL ROCKER SW 15FT (MISCELLANEOUS) ×3 IMPLANT
ELECT REM PT RETURN 9FT ADLT (ELECTROSURGICAL) ×3
ELECTRODE REM PT RTRN 9FT ADLT (ELECTROSURGICAL) ×1 IMPLANT
EVACUATOR SILICONE 100CC (DRAIN) ×2 IMPLANT
GLOVE BIO SURGEON STRL SZ 6.5 (GLOVE) ×2 IMPLANT
GLOVE BIO SURGEONS STRL SZ 6.5 (GLOVE) ×1
GLOVE BIOGEL M STRL SZ7.5 (GLOVE) ×3 IMPLANT
GOWN STRL REUS W/TWL LRG LVL3 (GOWN DISPOSABLE) ×6 IMPLANT
HEMOSTAT SURGICEL 4X8 (HEMOSTASIS) IMPLANT
KIT BASIN OR (CUSTOM PROCEDURE TRAY) ×3 IMPLANT
LIQUID BAND (GAUZE/BANDAGES/DRESSINGS) ×3 IMPLANT
POUCH ENDO CATCH II 15MM (MISCELLANEOUS) ×3 IMPLANT
POUCH SPECIMEN RETRIEVAL 10MM (ENDOMECHANICALS) ×1 IMPLANT
RELOAD 45 VASCULAR/THIN (ENDOMECHANICALS) ×3 IMPLANT
RELOAD STAPLE 45 2.5 WHT GRN (ENDOMECHANICALS) IMPLANT
RETRACTOR LAPSCP 12X46 CVD (ENDOMECHANICALS) IMPLANT
RTRCTR LAPSCP 12X46 CVD (ENDOMECHANICALS)
SCISSORS LAP 5X35 DISP (ENDOMECHANICALS) IMPLANT
SET IRRIG TUBING LAPAROSCOPIC (IRRIGATION / IRRIGATOR) ×3 IMPLANT
SHEARS HARMONIC ACE PLUS 36CM (ENDOMECHANICALS) ×3 IMPLANT
SPONGE LAP 18X18 X RAY DECT (DISPOSABLE) ×4 IMPLANT
SPONGE SURGIFOAM ABS GEL 100 (HEMOSTASIS) IMPLANT
SURGIFLO W/THROMBIN 8M KIT (HEMOSTASIS) IMPLANT
SUT CHROMIC 2 0 SH (SUTURE) ×3 IMPLANT
SUT ETHILON 3 0 PS 1 (SUTURE) IMPLANT
SUT MNCRL AB 4-0 PS2 18 (SUTURE) ×6 IMPLANT
SUT PDS AB 1 CTX 36 (SUTURE) ×6 IMPLANT
SUT VIC AB 2-0 SH 27 (SUTURE)
SUT VIC AB 2-0 SH 27X BRD (SUTURE) IMPLANT
SUT VICRYL 0 UR6 27IN ABS (SUTURE) ×6 IMPLANT
TOWEL OR 17X26 10 PK STRL BLUE (TOWEL DISPOSABLE) ×6 IMPLANT
TRAY FOLEY W/METER SILVER 14FR (SET/KITS/TRAYS/PACK) ×1 IMPLANT
TRAY FOLEY W/METER SILVER 16FR (SET/KITS/TRAYS/PACK) ×3 IMPLANT
TRAY LAPAROSCOPIC (CUSTOM PROCEDURE TRAY) ×3 IMPLANT
TROCAR BLADELESS OPT 5 100 (ENDOMECHANICALS) ×3 IMPLANT
TROCAR BLADELESS OPT 5 75 (ENDOMECHANICALS) ×1 IMPLANT
TROCAR XCEL 12X100 BLDLESS (ENDOMECHANICALS) ×3 IMPLANT
TROCAR XCEL BLUNT TIP 100MML (ENDOMECHANICALS) ×3 IMPLANT
TUBING INSUF HEATED (TUBING) ×3 IMPLANT
TUBING TUR DISP (UROLOGICAL SUPPLIES) ×1 IMPLANT
URINEMETER 200ML W/220 (MISCELLANEOUS) ×3 IMPLANT
YANKAUER SUCT BULB TIP 10FT TU (MISCELLANEOUS) ×3 IMPLANT

## 2016-04-18 NOTE — Anesthesia Preprocedure Evaluation (Signed)
Anesthesia Evaluation  Patient identified by MRN, date of birth, ID band Patient awake    Reviewed: Allergy & Precautions, NPO status , Patient's Chart, lab work & pertinent test results  Airway Mallampati: I  TM Distance: >3 FB Neck ROM: Full    Dental   Pulmonary    Pulmonary exam normal        Cardiovascular hypertension, Pt. on medications + CAD  Normal cardiovascular exam     Neuro/Psych    GI/Hepatic GERD  Medicated and Controlled,  Endo/Other  Hypothyroidism   Renal/GU      Musculoskeletal   Abdominal   Peds  Hematology   Anesthesia Other Findings   Reproductive/Obstetrics                             Anesthesia Physical Anesthesia Plan  ASA: II  Anesthesia Plan: General   Post-op Pain Management:    Induction: Intravenous  Airway Management Planned: Oral ETT  Additional Equipment:   Intra-op Plan:   Post-operative Plan: Extubation in OR  Informed Consent: I have reviewed the patients History and Physical, chart, labs and discussed the procedure including the risks, benefits and alternatives for the proposed anesthesia with the patient or authorized representative who has indicated his/her understanding and acceptance.     Plan Discussed with: CRNA and Surgeon  Anesthesia Plan Comments:         Anesthesia Quick Evaluation

## 2016-04-18 NOTE — Transfer of Care (Signed)
Immediate Anesthesia Transfer of Care Note  Patient: Eric Suarez  Procedure(s) Performed: Procedure(s): LAPAROSCOPIC RADICAL  LEFT NEPHRECTOMY (Left)  Patient Location: PACU  Anesthesia Type:General  Level of Consciousness:  sedated, patient cooperative and responds to stimulation  Airway & Oxygen Therapy:Patient Spontanous Breathing and Patient connected to face mask oxgen  Post-op Assessment:  Report given to PACU RN and Post -op Vital signs reviewed and stable  Post vital signs:  Reviewed and stable  Last Vitals:  Filed Vitals:   04/18/16 0521  BP: 105/69  Pulse: 69  Temp: 36.6 C  Resp: 18    Complications: No apparent anesthesia complications

## 2016-04-18 NOTE — Anesthesia Postprocedure Evaluation (Signed)
Anesthesia Post Note  Patient: Eric Suarez  Procedure(s) Performed: Procedure(s) (LRB): LAPAROSCOPIC RADICAL  LEFT NEPHRECTOMY (Left)  Patient location during evaluation: PACU Anesthesia Type: General Level of consciousness: awake and alert Pain management: pain level controlled Vital Signs Assessment: post-procedure vital signs reviewed and stable Respiratory status: spontaneous breathing, nonlabored ventilation, respiratory function stable and patient connected to nasal cannula oxygen Cardiovascular status: blood pressure returned to baseline and stable Postop Assessment: no signs of nausea or vomiting Anesthetic complications: no    Last Vitals:  Filed Vitals:   04/18/16 1045 04/18/16 1055  BP: 123/69   Pulse: 71 72  Temp:    Resp: 14 16    Last Pain:  Filed Vitals:   04/18/16 1055  PainSc: 6                  Gabrelle Roca DAVID

## 2016-04-18 NOTE — Progress Notes (Signed)
Post-op note  Subjective: The patient is doing well.  No complaints.  Denies N/V  Objective: Vital signs in last 24 hours: Temp:  [97.7 F (36.5 C)-97.9 F (36.6 C)] 97.7 F (36.5 C) (06/08 1230) Pulse Rate:  [67-76] 75 (06/08 1230) Resp:  [10-18] 14 (06/08 1230) BP: (105-135)/(62-72) 135/71 mmHg (06/08 1230) SpO2:  [90 %-100 %] 97 % (06/08 1230) Weight:  [91.627 kg (202 lb)] 91.627 kg (202 lb) (06/08 0557)  Intake/Output from previous day:   Intake/Output this shift: Total I/O In: 1800 [I.V.:1800] Out: 735 [Urine:710; Blood:25]  Physical Exam:  General: Alert and oriented. Abdomen: Soft, Nondistended. Incisions: Clean and dry. Urine: clear  Lab Results:  Recent Labs  04/18/16 1028  HGB 12.7*  HCT 36.2*    Assessment/Plan: POD#0   1) Continue to monitor  2) DVT prophy, clears, IS, amb, pain control   LOS: 0 days   Eric Suarez 04/18/2016, 2:01 PM

## 2016-04-18 NOTE — Discharge Instructions (Signed)
1.  Activity:  You are encouraged to ambulate frequently (about every hour during waking hours) to help prevent blood clots from forming in your legs or lungs.  However, you should not engage in any heavy lifting (> 10-15 lbs), strenuous activity, or straining. 2. Diet: You should advance your diet as instructed by your physician.  It will be normal to have some bloating, nausea, and abdominal discomfort intermittently. 3. Prescriptions:  You will be provided a prescription for pain medication to take as needed.  If your pain is not severe enough to require the prescription pain medication, you may take extra strength Tylenol instead which will have less side effects.  You should also take a prescribed stool softener to avoid straining with bowel movements as the prescription pain medication may constipate you. 4. Incisions: You may remove your dressing bandages 48 hours after surgery if not removed in the hospital.  You will either have some small staples or special tissue glue at each of the incision sites. Once the bandages are removed (if present), the incisions may stay open to air.  You may start showering (but not soaking or bathing in water) the 2nd day after surgery and the incisions simply need to be patted dry after the shower.  No additional care is needed. 5. What to call us about: You should call the office [phone]) if you develop fever > 101 or develop persistent vomiting.  You may resume aspirin, advil, aleve, vitamin, and supplements 7 days after surgery.

## 2016-04-18 NOTE — Anesthesia Procedure Notes (Signed)
Procedure Name: Intubation Date/Time: 04/18/2016 7:24 AM Performed by: West Pugh Pre-anesthesia Checklist: Patient identified, Emergency Drugs available, Suction available, Patient being monitored and Timeout performed Patient Re-evaluated:Patient Re-evaluated prior to inductionOxygen Delivery Method: Circle system utilized Preoxygenation: Pre-oxygenation with 100% oxygen Intubation Type: IV induction Ventilation: Mask ventilation without difficulty Laryngoscope Size: Mac and 4 Grade View: Grade I Tube type: Oral Tube size: 7.5 mm Number of attempts: 1 Airway Equipment and Method: Stylet Placement Confirmation: ETT inserted through vocal cords under direct vision,  positive ETCO2,  CO2 detector and breath sounds checked- equal and bilateral Secured at: 22 cm Tube secured with: Tape Dental Injury: Teeth and Oropharynx as per pre-operative assessment

## 2016-04-18 NOTE — Op Note (Signed)
Preoperative diagnosis: Left renal neoplasm  Postoperative diagnosis: Left renal neoplasm  Procedure: 1.  Left laparoscopic radical nephrectomy  Surgeon: Pryor Curia. M.D.  Assistant(s): Debbrah Alar, PA-C  Anesthesia: General  Complications: None  EBL: 25 mL  IVF:  1800 mL crystalloid  Specimens: 1. Left kidney  Disposition of specimens: Pathology  Indication: Eric Suarez is a 70 y.o. patient with a left renal tumor suspicious for malignancy.  After a thorough review of the management options for their renal mass, they elected to proceed with surgical treatment and the above procedure.  We have discussed the potential benefits and risks of the procedure, side effects of the proposed treatment, the likelihood of the patient achieving the goals of the procedure, and any potential problems that might occur during the procedure or recuperation. Informed consent has been obtained.  Description of procedure:  The patient was taken to the operating room and a general anesthetic was administered. The patient was given preoperative antibiotics, placed in the left modified flank position, and prepped and draped in the usual sterile fashion. Next a preoperative timeout was performed.  A site was selected near the umbilicus for placement of the camera port. This was placed using a standard open Hassan technique which allowed entry into the peritoneal cavity under direct vision and without difficulty. A 12 mm Hassan cannula was placed and a pneumoperitoneum established. The camera was then used to inspect the abdomen and there was no evidence of any intra-abdominal injuries or other abnormalities. The remaining abdominal ports were then placed. A 12 mm port was placed in the left lower quadrant and a 5 mm port was placed in the left upper quadrant.  All ports were placed under direct vision without difficulty.  Utilizing the harmonic scalpel, the white line of Toldt was incised  allowing the colon to be reflected medially and the plane between the mesocolon and the anterior layer of Gerota's fascia to be developed and the kidney exposed.  The ureter and gonadal vein were identified inferiorly and the ureter was lifted anteriorly off the psoas muscle.  Dissection proceeded superiorly along the gonadal vein until the renal vein was identified.  The renal hilum was then carefully isolated with a combination of blunt and sharp dissectiong allowing the renal arterial and venous structures to be separated and isolated.   The renal artery  was isolated and ligated with multiple Weck clips and subsequently divided.  The renal vein was then isolated and also ligated and divided with a 45 mm Flex ETS stapler.  Gerota's fascia was intentionally entered superiorly and the space between the adrenal gland and the kidney was developed allowing the adrenal gland to be spared.  The splenorenal ligaments were divided with the harmonic scalpel.  The lateral and posterior attachements to the kidney were then divided.  The ureter was ligated with Weck clips and divided allowing the specimen to be freed from all surrounding structures.  The kidney specimen was then placed into a 15 mm Endocatch II retrieval bag.  The renal hilum, liver, adrenal bed and gonadal vein areas were each inspected and hemostasis was ensured with the pneomperitoneal pressures lowered.  The 12 mm lower quadrant port was then closed with a 0-vicryl suture placed laparoscopically to close the fascia of this incision. All remaining ports were removed under direct vision.  The kidney specimen was removed intact within the retrieval bag via the camera port site after this incision was extended slightly. This fascial opening was  then closed with two #1 PDS sutures.    All incisions were injected with local anesthetic and reapproximated at the skin with 4-0 monocryl sutures.  Dermabond was applied to the skin. The patient tolerated the  procedure well and without complications and was transferred to the recovery unit in satisfactory condition.   Pryor Curia MD

## 2016-04-19 LAB — BASIC METABOLIC PANEL
ANION GAP: 6 (ref 5–15)
BUN: 26 mg/dL — ABNORMAL HIGH (ref 6–20)
CALCIUM: 8.4 mg/dL — AB (ref 8.9–10.3)
CO2: 23 mmol/L (ref 22–32)
Chloride: 103 mmol/L (ref 101–111)
Creatinine, Ser: 1.89 mg/dL — ABNORMAL HIGH (ref 0.61–1.24)
GFR, EST AFRICAN AMERICAN: 40 mL/min — AB (ref >60)
GFR, EST NON AFRICAN AMERICAN: 34 mL/min — AB (ref >60)
Glucose, Bld: 156 mg/dL — ABNORMAL HIGH (ref 65–99)
Potassium: 4.1 mmol/L (ref 3.5–5.1)
Sodium: 132 mmol/L — ABNORMAL LOW (ref 135–145)

## 2016-04-19 LAB — HEMOGLOBIN AND HEMATOCRIT, BLOOD
HEMATOCRIT: 33.5 % — AB (ref 39.0–52.0)
HEMOGLOBIN: 11.8 g/dL — AB (ref 13.0–17.0)

## 2016-04-19 MED ORDER — HYDROCODONE-ACETAMINOPHEN 5-325 MG PO TABS
1.0000 | ORAL_TABLET | Freq: Four times a day (QID) | ORAL | Status: DC | PRN
  Administered 2016-04-19 (×2): 1 via ORAL
  Filled 2016-04-19 (×2): qty 1

## 2016-04-19 MED ORDER — BISACODYL 10 MG RE SUPP
10.0000 mg | Freq: Once | RECTAL | Status: AC
  Administered 2016-04-19: 10 mg via RECTAL
  Filled 2016-04-19: qty 1

## 2016-04-19 NOTE — Plan of Care (Signed)
Problem: Skin Integrity: Goal: Risk for impaired skin integrity will decrease Outcome: Completed/Met Date Met:  04/19/16 Overall skin is intact. incision sites to abd. S&s infection reviewed

## 2016-04-19 NOTE — Progress Notes (Signed)
Patient is alert and oriented x4, ambulatory. Questions concerns were denied r/t dc instruction review

## 2016-04-19 NOTE — Evaluation (Signed)
Physical Therapy One Time Evaluation Patient Details Name: Eric Suarez MRN: [id number] DOB: 1946-01-14 Today's Date: 04/19/2016   History of Present Illness  Pt is a 70 year old male s/p left laparoscopic radical nephrectomy due to left renal tumor suspicious for malignancy  Clinical Impression  Patient evaluated by Physical Therapy with no further acute PT needs identified. All education has been completed and the patient has no further questions.  See below for any follow-up Physical Therapy or equipment needs. PT is signing off. Thank you for this referral.     Follow Up Recommendations No PT follow up    Equipment Recommendations  None recommended by PT    Recommendations for Other Services       Precautions / Restrictions Precautions Precautions: None      Mobility  Bed Mobility Overal bed mobility: Modified Independent                Transfers Overall transfer level: Modified independent                  Ambulation/Gait Ambulation/Gait assistance: Modified independent (Device/Increase time) Ambulation Distance (Feet): 400 Feet Assistive device: None Gait Pattern/deviations: WFL(Within Functional Limits)     General Gait Details: no deficits observed  Stairs            Wheelchair Mobility    Modified Rankin (Stroke Patients Only)       Balance Overall balance assessment: No apparent balance deficits (not formally assessed)                                           Pertinent Vitals/Pain Pain Assessment: No/denies pain    Home Living Family/patient expects to be discharged to:: Private residence Living Arrangements: Spouse/significant other   Type of Home: House Home Access: Stairs to enter   Technical brewer of Steps: 3 Home Layout: One level Home Equipment: None      Prior Function Level of Independence: Independent               Hand Dominance        Extremity/Trunk Assessment   Upper Extremity Assessment: Overall WFL for tasks assessed           Lower Extremity Assessment: Overall WFL for tasks assessed      Cervical / Trunk Assessment: Normal  Communication   Communication: No difficulties  Cognition Arousal/Alertness: Awake/alert Behavior During Therapy: WFL for tasks assessed/performed Overall Cognitive Status: Within Functional Limits for tasks assessed                      General Comments      Exercises        Assessment/Plan    PT Assessment Patent does not need any further PT services  PT Diagnosis Difficulty walking   PT Problem List    PT Treatment Interventions     PT Goals (Current goals can be found in the Care Plan section) Acute Rehab PT Goals PT Goal Formulation: All assessment and education complete, DC therapy    Frequency     Barriers to discharge        Co-evaluation               End of Session   Activity Tolerance: Patient tolerated treatment well Patient left: in bed;with call bell/phone within reach  Time: [id number] PT Time Calculation (min) (ACUTE ONLY): 9 min   Charges:   PT Evaluation $PT Eval Low Complexity: 1 Procedure     PT G Codes:        Mukhtar Shams,KATHrine E 04/19/2016, 10:59 AM Carmelia Bake, PT, DPT 04/19/2016 Pager: [phone]

## 2016-04-19 NOTE — Progress Notes (Signed)
Initial Nutrition Assessment  DOCUMENTATION CODES:   Not applicable  INTERVENTION:  - RD will monitor for potential needs prior to d/c. - Continue Boost Breeze TID, each supplement provides 250 kcal and 9 grams of protein  NUTRITION DIAGNOSIS:   Increased nutrient needs related to cancer and cancer related treatments as evidenced by estimated needs.  GOAL:   Patient will meet greater than or equal to 90% of their needs  MONITOR:   PO intake, Weight trends, Labs, Skin, I & O's  REASON FOR ASSESSMENT:   Malnutrition Screening Tool  ASSESSMENT:   70 year old gentleman who approximately 2 weeks ago, he presented to the emergency department with severe abdominal pain which prompted a CT scan of the chest, abdomen, and pelvis with contrast. He was noted to have a large incarcerated left inguinal hernia. He underwent surgical treatment including a left open inguinal hernia repair and subsequent exploratory laparoscopy to ensure that his bowel remained viable. He ultimately did not require any surgery on his bowel. He was noted to have a 4.7 cm heterogeneous appearing upper pole left renal mass. This mass did appear hyperdense. This mass also appeared consistent with a renal cell carcinoma although was completely endophytic and does raise a question that it could represent a urothelial carcinoma although delayed images did not strongly suggest this.  Pt seen for MST. BMI indicates overweight status. POD #1 L laparoscopic radical nephrectomy. Per chart review, pt ate 100% of lunch today which he reports was a grilled chicken sandwich and cookies. Pt states some feelings of gas after the meal; he states he was given a suppository this AM and that after meal he walked 2 laps around the floor and was able to have a BM which resolved discomfort. Pt denies any other feelings of pain and no nausea since surgery.   Pt reports that PTA he had a very good appetite and was eating well without issue. He  states that since surgery for hernia ~6 weeks ago he began walking for exercise and intentionally lost ~15 lbs. No muscle or fat wasting noted at this time.   Pt likely to meet needs; will monitor for potential needs prior to d/c. Medications reviewed. IVF: D5-1/2 NS @ 75 mL/hr (306 kcal). Labs reviewed; Na: 132 mmol/L, BUN/creatinine elevated and trending up, Ca: 8.4 mg/dL, GFR: 34.   Diet Order:  Diet regular Room service appropriate?: Yes; Fluid consistency:: Thin  Skin:  Reviewed, no issues  Last BM:  6/8  Height:   Ht Readings from Last 1 Encounters:  04/18/16 6\' 3"  (1.905 m)    Weight:   Wt Readings from Last 1 Encounters:  04/18/16 202 lb (91.627 kg)    Ideal Body Weight:  89.09 kg (kg)  BMI:  Body mass index is 25.25 kg/(m^2).  Estimated Nutritional Needs:   Kcal:  2050-2250  Protein:  100-110 grams  Fluid:  >/= 2 L/day  EDUCATION NEEDS:   No education needs identified at this time     Jarome Matin, RD, LDN Inpatient Clinical Dietitian Pager # [phone] After hours/weekend pager # [phone]

## 2016-04-19 NOTE — Progress Notes (Signed)
Patient ID: Eric Suarez, male   DOB: Mar 24, 1946, 70 y.o.   MRN: [id number]  1 Day Post-Op Subjective: Pt passing flatus.  Ambulating well.  Tolerating diet.  Pain controlled.  Objective: Vital signs in last 24 hours: Temp:  [97.5 F (36.4 C)-98.1 F (36.7 C)] 98.1 F (36.7 C) (06/09 1330) Pulse Rate:  [54-70] 66 (06/09 1330) Resp:  [16] 16 (06/09 1330) BP: (103-115)/(62-70) 103/67 mmHg (06/09 1330) SpO2:  [96 %-99 %] 96 % (06/09 1330)  Intake/Output from previous day: 06/08 0701 - 06/09 0700 In: 5792.5 [P.O.:880; I.V.:4562.5; IV Piggyback:350] Out: 3035 [Urine:3010; Blood:25] Intake/Output this shift: Total I/O In: 360 [P.O.:360] Out: 1301 [Urine:1300; Stool:1]  Physical Exam:  General: Alert and oriented  Abdomen: Soft, ND   Lab Results:  Recent Labs  04/18/16 1028 04/19/16 0420  HGB 12.7* 11.8*  HCT 36.2* 33.5*   BMET  Recent Labs  04/18/16 1028 04/19/16 0420  NA 133* 132*  K 4.2 4.1  CL 104 103  CO2 22 23  GLUCOSE 179* 156*  BUN 24* 26*  CREATININE 1.54* 1.89*  CALCIUM 8.4* 8.4*     Studies/Results: No results found.  Assessment/Plan: D/C home today   LOS: 1 day   Yukio Bisping,LES 04/19/2016, 3:36 PM

## 2016-04-19 NOTE — Discharge Summary (Signed)
  Date of admission: 04/18/2016  Date of discharge: 04/19/2016  Admission diagnosis: Left renal mass  Discharge diagnosis: Left renal mass  Secondary diagnoses: Hypertension, hyperlipidemia  History and Physical: For full details, please see admission history and physical. Briefly, ARCADIO CRUTE is a 70 y.o. year old patient with a left renal mass concerning for renal malignancy.   Hospital Course: He underwent a left laparoscopic radical nephrectomy on [id number] without complications.  He remained hemodynamically stable.  He was able to ambulate and have his diet advanced.  His pain was controlled on po pain medications.  His Cr increased as expected and will be rechecked as an outpatient.  Laboratory values:  Recent Labs  04/18/16 1028 04/19/16 0420  HGB 12.7* 11.8*  HCT 36.2* 33.5*    Recent Labs  04/18/16 1028 04/19/16 0420  CREATININE 1.54* 1.89*    Disposition: Home  Discharge instruction: The patient was instructed to be ambulatory but told to refrain from heavy lifting, strenuous activity, or driving.   Discharge medications:    Medication List    STOP taking these medications        Fish Oil 1200 MG Caps     OVER THE COUNTER MEDICATION     OVER THE COUNTER MEDICATION     Ubiquinol 100 MG Caps      TAKE these medications        atenolol-chlorthalidone 50-25 MG tablet  Commonly known as:  TENORETIC  Take 1 tablet by mouth daily.     atorvastatin 20 MG tablet  Commonly known as:  LIPITOR  Take 20 mg by mouth at bedtime.     BENICAR 20 MG tablet  Generic drug:  olmesartan  Take 10 mg by mouth daily.     HYDROcodone-acetaminophen 5-325 MG tablet  Commonly known as:  NORCO  Take 1-2 tablets by mouth every 6 (six) hours as needed for moderate pain.     KLOR-CON M20 20 MEQ tablet  Generic drug:  potassium chloride SA  Take 20 mEq by mouth daily.     lansoprazole 15 MG capsule  Commonly known as:  PREVACID  Take 15 mg by mouth at bedtime.     PARoxetine 25 MG 24 hr tablet  Commonly known as:  PAXIL-CR  Take 25 mg by mouth daily.     phenazopyridine 100 MG tablet  Commonly known as:  PYRIDIUM  Take 1 tablet (100 mg total) by mouth 3 (three) times daily as needed for pain (for burning).     SYNTHROID 125 MCG tablet  Generic drug:  levothyroxine  Take 125 mcg by mouth daily.        Followup:      Follow-up Information    Follow up with Dutch Gray, MD On 05/07/2016.   Specialty:  Urology   Why:  at 10:45   Contact information:   [place] [address] [phone]

## 2016-04-19 NOTE — Plan of Care (Signed)
Problem: Activity: Goal: Risk for activity intolerance will decrease Outcome: Completed/Met Date Met:  04/19/16 Patient has ambulated x 3 today. Tolerated well.

## 2016-04-19 NOTE — Plan of Care (Signed)
Problem: Nutrition: Goal: Adequate nutrition will be maintained Outcome: Completed/Met Date Met:  04/19/16 Diet was advanced to regular. Pt has tolerated meals well

## 2016-04-19 NOTE — Progress Notes (Signed)
Patient ID: Eric Suarez, male   DOB: 1946/02/13, 70 y.o.   MRN: [id number]  1 Day Post-Op Subjective: Pt doing well.  No flatus but tolerating diet.  Ambulating well.  Objective: Vital signs in last 24 hours: Temp:  [97.6 F (36.4 C)-98 F (36.7 C)] 98 F (36.7 C) (06/09 0444) Pulse Rate:  [54-76] 55 (06/09 0444) Resp:  [10-16] 16 (06/09 0444) BP: (103-135)/(62-72) 104/62 mmHg (06/09 0444) SpO2:  [90 %-100 %] 98 % (06/09 0444)  Intake/Output from previous day: 06/08 0701 - 06/09 0700 In: 4592.5 [P.O.:880; I.V.:3362.5; IV Piggyback:350] Out: 3035 [Urine:3010; Blood:25] Intake/Output this shift:    Physical Exam:  General: Alert and oriented CV: RRR Lungs: Clear Abdomen: Soft, ND Incisions: C/D/I Ext: NT, No erythema  Lab Results:  Recent Labs  04/18/16 1028 04/19/16 0420  HGB 12.7* 11.8*  HCT 36.2* 33.5*   BMET  Recent Labs  04/18/16 1028 04/19/16 0420  NA 133* 132*  K 4.2 4.1  CL 104 103  CO2 22 23  GLUCOSE 179* 156*  BUN 24* 26*  CREATININE 1.54* 1.89*  CALCIUM 8.4* 8.4*     Studies/Results: No results found.  Assessment/Plan: 1. POD # 1 s/p left laparoscopic radical nephrectomy - Ambulate, IS - D/C Foley - Monitor renal function - Advance diet - Dulcolax suppository - Decreease IVF   LOS: 1 day   Theodis Kinsel,LES 04/19/2016, 7:02 AM

## 2016-05-07 DIAGNOSIS — C642 Malignant neoplasm of left kidney, except renal pelvis: Secondary | ICD-10-CM | POA: Diagnosis not present

## 2016-05-23 DIAGNOSIS — C649 Malignant neoplasm of unspecified kidney, except renal pelvis: Secondary | ICD-10-CM | POA: Diagnosis not present

## 2016-05-23 DIAGNOSIS — I10 Essential (primary) hypertension: Secondary | ICD-10-CM | POA: Diagnosis not present

## 2016-05-23 DIAGNOSIS — E789 Disorder of lipoprotein metabolism, unspecified: Secondary | ICD-10-CM | POA: Diagnosis not present

## 2016-08-12 DIAGNOSIS — I10 Essential (primary) hypertension: Secondary | ICD-10-CM | POA: Diagnosis not present

## 2016-08-12 DIAGNOSIS — Z125 Encounter for screening for malignant neoplasm of prostate: Secondary | ICD-10-CM | POA: Diagnosis not present

## 2016-08-12 DIAGNOSIS — Z0000 Encounter for general adult medical examination without abnormal findings: Secondary | ICD-10-CM | POA: Diagnosis not present

## 2016-08-12 DIAGNOSIS — E789 Disorder of lipoprotein metabolism, unspecified: Secondary | ICD-10-CM | POA: Diagnosis not present

## 2016-08-20 DIAGNOSIS — Z23 Encounter for immunization: Secondary | ICD-10-CM | POA: Diagnosis not present

## 2016-08-20 DIAGNOSIS — K5900 Constipation, unspecified: Secondary | ICD-10-CM | POA: Diagnosis not present

## 2016-08-20 DIAGNOSIS — C649 Malignant neoplasm of unspecified kidney, except renal pelvis: Secondary | ICD-10-CM | POA: Diagnosis not present

## 2016-09-24 DIAGNOSIS — R109 Unspecified abdominal pain: Secondary | ICD-10-CM | POA: Diagnosis not present

## 2016-10-24 ENCOUNTER — Other Ambulatory Visit: Payer: Self-pay | Admitting: Urology

## 2016-10-24 ENCOUNTER — Ambulatory Visit (HOSPITAL_COMMUNITY)
Admission: RE | Admit: 2016-10-24 | Discharge: 2016-10-24 | Disposition: A | Discharge status: Home / Self Care | Payer: PPO | Source: Ambulatory Visit | Attending: Urology | Admitting: Urology

## 2016-10-24 DIAGNOSIS — C642 Malignant neoplasm of left kidney, except renal pelvis: Secondary | ICD-10-CM

## 2016-10-24 DIAGNOSIS — K449 Diaphragmatic hernia without obstruction or gangrene: Secondary | ICD-10-CM | POA: Diagnosis not present

## 2016-10-24 DIAGNOSIS — J449 Chronic obstructive pulmonary disease, unspecified: Secondary | ICD-10-CM | POA: Diagnosis not present

## 2016-11-01 DIAGNOSIS — K432 Incisional hernia without obstruction or gangrene: Secondary | ICD-10-CM | POA: Diagnosis not present

## 2016-11-01 DIAGNOSIS — C642 Malignant neoplasm of left kidney, except renal pelvis: Secondary | ICD-10-CM | POA: Diagnosis not present

## 2016-11-21 ENCOUNTER — Ambulatory Visit: Payer: Self-pay | Admitting: General Surgery

## 2016-11-21 DIAGNOSIS — K432 Incisional hernia without obstruction or gangrene: Secondary | ICD-10-CM | POA: Diagnosis not present

## 2016-11-21 NOTE — H&P (Signed)
History of Present Illness Ralene Ok MD; 11/21/2016 9:17 AM) The patient is a 71 year old male who presents with an incisional hernia. Patient is a 71 year old male who is referred by Dr. Dutch Gray for evaluation of a incisional hernia. Patient had a left nephrectomy secondary to renal cell carcinoma in June 2017. Patient states that prostate fortify months after surgery he noticed a large bulge in his midline incision. Patient states she does have some achiness at times. He is active. He's had no signs or symptoms of incarceration or strangulation.  Patient does not smoke. Patient does not take steroids. Of note the patient does have a history of 3 recurrent inguinal hernias in the past. All repaired in open fashion. Patient also has had a right inguinal hernia repair in open fashion.   Past Surgical History Malachy Moan, [place]; 11/21/2016 8:47 AM) Appendectomy  Colon Polyp Removal - Colonoscopy  Hemorrhoidectomy  Nephrectomy  Left. Open Inguinal Hernia Surgery  Bilateral. multiple Thyroid Surgery  Tonsillectomy   Diagnostic Studies History Malachy Moan, RMA; 11/21/2016 8:47 AM) Colonoscopy  1-5 years ago  Allergies Malachy Moan, RMA; 11/21/2016 8:48 AM) Sulfa Antibiotics  Rash.  Medication History Malachy Moan, [place]; 11/21/2016 8:50 AM) Atorvastatin Calcium (20MG  Tablet, Oral daily) Active. PARoxetine HCl ER (25MG  Tablet ER 24HR, Oral daily) Active. Atenolol-Chlorthalidone (50-25MG  Tablet, Oral daily) Active. Klor-Con M20 [facility] Tablet ER, Oral daily) Active. Synthroid (125MCG Tablet, Oral daily) Active. Prevacid (15MG  Capsule DR, Oral daily) Active. Medications Reconciled  Social History Malachy Moan, [place]; 11/21/2016 8:47 AM) Caffeine use  Coffee. No alcohol use  No drug use  Tobacco use  Never smoker.  Family History Malachy Moan, [place]; 11/21/2016 8:47 AM) Arthritis  Father. Cerebrovascular Accident  Father. Heart  Disease  Brother, Father. Heart disease in male family member before age 41  Hypertension  Father, Mother.  Other Problems Malachy Moan, RMA; 11/21/2016 8:47 AM) Arthritis  Back Pain  Cancer  Enlarged Prostate  Gastroesophageal Reflux Disease  Hemorrhoids  Hypercholesterolemia  Inguinal Hernia  Other disease, cancer, significant illness  Thyroid Cancer  Thyroid Disease     Review of Systems Malachy Moan RMA; 11/21/2016 8:47 AM) General Not Present- Appetite Loss, Chills, Fatigue, Fever, Night Sweats, Weight Gain and Weight Loss. Skin Not Present- Change in Wart/Mole, Dryness, Hives, Jaundice, New Lesions, Non-Healing Wounds, Rash and Ulcer. HEENT Present- Hearing Loss, Ringing in the Ears and Wears glasses/contact lenses. Not Present- Earache, Hoarseness, Nose Bleed, Oral Ulcers, Seasonal Allergies, Sinus Pain, Sore Throat, Visual Disturbances and Yellow Eyes. Respiratory Not Present- Bloody sputum, Chronic Cough, Difficulty Breathing, Snoring and Wheezing. Cardiovascular Not Present- Chest Pain, Difficulty Breathing Lying Down, Leg Cramps, Palpitations, Rapid Heart Rate, Shortness of Breath and Swelling of Extremities. Gastrointestinal Not Present- Abdominal Pain, Bloating, Bloody Stool, Change in Bowel Habits, Chronic diarrhea, Constipation, Difficulty Swallowing, Excessive gas, Gets full quickly at meals, Hemorrhoids, Indigestion, Nausea, Rectal Pain and Vomiting. Male Genitourinary Present- Frequency and Impotence. Not Present- Blood in Urine, Change in Urinary Stream, Nocturia, Painful Urination, Urgency and Urine Leakage. Musculoskeletal Present- Back Pain and Joint Pain. Not Present- Joint Stiffness, Muscle Pain, Muscle Weakness and Swelling of Extremities. Neurological Not Present- Decreased Memory, Fainting, Headaches, Numbness, Seizures, Tingling, Tremor, Trouble walking and Weakness. Psychiatric Not Present- Anxiety, Bipolar, Change in Sleep Pattern,  Depression, Fearful and Frequent crying.  Vitals Malachy Moan RMA; 11/21/2016 8:54 AM) 11/21/2016 8:50 AM Weight: 211.6 lb Height: 75in Body Surface Area: 2.25 m Body Mass Index: 26.45 kg/m  Temp.: 97.35F  Pulse:  71 (Regular)  BP: 128/70 (Sitting, Left Arm, Standard)       Physical Exam Ralene Ok MD; 11/21/2016 9:17 AM) General Mental Status-Alert. General Appearance-Consistent with stated age. Hydration-Well hydrated. Voice-Normal.  Head and Neck Head-normocephalic, atraumatic with no lesions or palpable masses. Trachea-midline. Thyroid Gland Characteristics - normal size and consistency.  Chest and Lung Exam Chest and lung exam reveals -quiet, even and easy respiratory effort with no use of accessory muscles and on auscultation, normal breath sounds, no adventitious sounds and normal vocal resonance. Inspection Chest Wall - Normal. Back - normal.  Cardiovascular Cardiovascular examination reveals -normal heart sounds, regular rate and rhythm with no murmurs and normal pedal pulses bilaterally.  Abdomen Inspection Skin - Scar - no surgical scars. Hernias - Incisional - Reducible(Midline, periumbilical Appears to be about 4 x 5 cm, reducible, also has a umbilical hernia). Palpation/Percussion Normal exam - Soft, Non Tender, No Rebound tenderness, No Rigidity (guarding) and No hepatosplenomegaly. Auscultation Normal exam - Bowel sounds normal.    Assessment & Plan Ralene Ok MD; 11/21/2016 9:19 AM) Fatima Blank HERNIA, WITHOUT OBSTRUCTION OR GANGRENE (K43.2) Impression: Patient is a 71 year old male with an incisional hernia  1. We will obtain a CT scan with no IV or oral contrast to evaluate size of the hernia. This will help determine whether we can proceed in open fashion laparoscopic fashion. Once we decide we'll call the patient and let him know whether we will be proceeding with laparoscopic or open repair. 2. We will  proceed to the operating for a lap versus open incisional hernia repair with mesh. 3. All risks and benefits were discussed with the patient to generally include, but not limited to: infection, bleeding, damage to surrounding structures, acute and chronic nerve pain, and recurrence. Alternatives were offered and described. All questions were answered and the patient voiced understanding of the procedure and wishes to proceed at this point with hernia repair.

## 2016-11-25 ENCOUNTER — Other Ambulatory Visit: Payer: Self-pay | Admitting: General Surgery

## 2016-11-25 DIAGNOSIS — K432 Incisional hernia without obstruction or gangrene: Secondary | ICD-10-CM

## 2016-11-29 ENCOUNTER — Ambulatory Visit
Admission: RE | Admit: 2016-11-29 | Discharge: 2016-11-29 | Disposition: A | Discharge status: Home / Self Care | Payer: PPO | Source: Ambulatory Visit | Attending: General Surgery | Admitting: General Surgery

## 2016-11-29 DIAGNOSIS — K432 Incisional hernia without obstruction or gangrene: Secondary | ICD-10-CM

## 2016-11-29 DIAGNOSIS — K439 Ventral hernia without obstruction or gangrene: Secondary | ICD-10-CM | POA: Diagnosis not present

## 2016-12-17 ENCOUNTER — Encounter (HOSPITAL_COMMUNITY)
Admission: RE | Admit: 2016-12-17 | Discharge: 2016-12-17 | Disposition: A | Discharge status: Home / Self Care | Payer: PPO | Source: Ambulatory Visit | Attending: General Surgery | Admitting: General Surgery

## 2016-12-17 ENCOUNTER — Encounter (HOSPITAL_COMMUNITY): Payer: Self-pay

## 2016-12-17 DIAGNOSIS — K432 Incisional hernia without obstruction or gangrene: Secondary | ICD-10-CM | POA: Insufficient documentation

## 2016-12-17 DIAGNOSIS — Z01812 Encounter for preprocedural laboratory examination: Secondary | ICD-10-CM | POA: Insufficient documentation

## 2016-12-17 HISTORY — DX: Personal history of other diseases of the digestive system: Z87.19

## 2016-12-17 HISTORY — DX: Other specified postprocedural states: Z98.890

## 2016-12-17 HISTORY — DX: Anxiety disorder, unspecified: F41.9

## 2016-12-17 HISTORY — DX: Nausea with vomiting, unspecified: R11.2

## 2016-12-17 LAB — BASIC METABOLIC PANEL
ANION GAP: 11 (ref 5–15)
BUN: 27 mg/dL — ABNORMAL HIGH (ref 6–20)
CALCIUM: 9.3 mg/dL (ref 8.9–10.3)
CO2: 23 mmol/L (ref 22–32)
CREATININE: 1.56 mg/dL — AB (ref 0.61–1.24)
Chloride: 102 mmol/L (ref 101–111)
GFR calc Af Amer: 50 mL/min — ABNORMAL LOW (ref >60)
GFR, EST NON AFRICAN AMERICAN: 43 mL/min — AB (ref >60)
Glucose, Bld: 134 mg/dL — ABNORMAL HIGH (ref 65–99)
Potassium: 3.8 mmol/L (ref 3.5–5.1)
SODIUM: 136 mmol/L (ref 135–145)

## 2016-12-17 LAB — CBC
HCT: 40.7 % (ref 39.0–52.0)
Hemoglobin: 14.1 g/dL (ref 13.0–17.0)
MCH: 30.7 pg (ref 26.0–34.0)
MCHC: 34.6 g/dL (ref 30.0–36.0)
MCV: 88.7 fL (ref 78.0–100.0)
PLATELETS: 143 10*3/uL — AB (ref 150–400)
RBC: 4.59 MIL/uL (ref 4.22–5.81)
RDW: 14.6 % (ref 11.5–15.5)
WBC: 8.4 10*3/uL (ref 4.0–10.5)

## 2016-12-17 NOTE — Pre-Procedure Instructions (Addendum)
KIYOSHI SCHERF  12/17/2016      [id number] - [place] [address] [place] [address] Phone: [phone] Fax: [phone]    Your procedure is scheduled on 12/23/16.  Report to [facility] Admitting at 745 A.M.  Call this number if you have problems the morning of surgery:  [phone]   Remember:  Do not eat food or drink liquids after midnight.  Take these medicines the morning of surgery with A SIP OF WATER      Atenolol(tenoretic),paroxetine(paxil),synthroid  STOP all herbel meds, nsaids (aleve,naproxen,advil,ibuprofen) starting TODAY including all viramins/supplements, aspirin   Do not wear jewelry, make-up or nail polish.  Do not wear lotions, powders, or perfumes, or deoderant.  Do not shave 48 hours prior to surgery.  Men may shave face and neck.  Do not bring valuables to the hospital.  [facility] is not responsible for any belongings or valuables.  Contacts, dentures or bridgework may not be worn into surgery.  Leave your suitcase in the car.  After surgery it may be brought to your room.  For patients admitted to the hospital, discharge time will be determined by your treatment team.  Patients discharged the day of surgery will not be allowed to drive home.   Special instructions:   Special Instructions: [facility] - Preparing for Surgery  Before surgery, you can play an important role.  Because skin is not sterile, your skin needs to be as free of germs as possible.  You can reduce the number of germs on you skin by washing with CHG (chlorahexidine gluconate) soap before surgery.  CHG is an antiseptic cleaner which kills germs and bonds with the skin to continue killing germs even after washing.  Please DO NOT use if you have an allergy to CHG or antibacterial soaps.  If your skin becomes reddened/irritated stop using the CHG and inform your nurse when you arrive at Short Stay.  Do not  shave (including legs and underarms) for at least 48 hours prior to the first CHG shower.  You may shave your face.  Please follow these instructions carefully:   1.  Shower with CHG Soap the night before surgery and the morning of Surgery.  2.  If you choose to wash your hair, wash your hair first as usual with your normal shampoo.  3.  After you shampoo, rinse your hair and body thoroughly to remove the Shampoo.  4.  Use CHG as you would any other liquid soap.  You can apply chg directly  to the skin and wash gently with scrungie or a clean washcloth.  5.  Apply the CHG Soap to your body ONLY FROM THE NECK DOWN.  Do not use on open wounds or open sores.  Avoid contact with your eyes ears, mouth and genitals (private parts).  Wash genitals (private parts)       with your normal soap.  6.  Wash thoroughly, paying special attention to the area where your surgery will be performed.  7.  Thoroughly rinse your body with warm water from the neck down.  8.  DO NOT shower/wash with your normal soap after using and rinsing off the CHG Soap.  9.  Pat yourself dry with a clean towel.            10.  Wear clean pajamas.  11.  Place clean sheets on your bed the night of your first shower and do not sleep with pets.  Day of Surgery  Do not apply any lotions/deodorants the morning of surgery.  Please wear clean clothes to the hospital/surgery center.  Please read over the  fact sheets that you were given.

## 2016-12-23 ENCOUNTER — Encounter (HOSPITAL_COMMUNITY): Payer: Self-pay | Admitting: Anesthesiology

## 2016-12-23 ENCOUNTER — Encounter (HOSPITAL_COMMUNITY)
Admission: RE | Disposition: A | Discharge status: Home / Self Care | Payer: Self-pay | Source: Ambulatory Visit | Attending: General Surgery

## 2016-12-23 ENCOUNTER — Ambulatory Visit (HOSPITAL_COMMUNITY)
Admission: RE | Admit: 2016-12-23 | Discharge: 2016-12-23 | Disposition: A | Discharge status: Home / Self Care | Payer: PPO | Source: Ambulatory Visit | Attending: General Surgery | Admitting: General Surgery

## 2016-12-23 ENCOUNTER — Ambulatory Visit (HOSPITAL_COMMUNITY): Payer: PPO | Admitting: Anesthesiology

## 2016-12-23 ENCOUNTER — Ambulatory Visit (HOSPITAL_COMMUNITY): Payer: PPO | Admitting: Emergency Medicine

## 2016-12-23 DIAGNOSIS — I2510 Atherosclerotic heart disease of native coronary artery without angina pectoris: Secondary | ICD-10-CM | POA: Insufficient documentation

## 2016-12-23 DIAGNOSIS — E871 Hypo-osmolality and hyponatremia: Secondary | ICD-10-CM | POA: Diagnosis not present

## 2016-12-23 DIAGNOSIS — Z85850 Personal history of malignant neoplasm of thyroid: Secondary | ICD-10-CM | POA: Insufficient documentation

## 2016-12-23 DIAGNOSIS — R1084 Generalized abdominal pain: Secondary | ICD-10-CM | POA: Diagnosis not present

## 2016-12-23 DIAGNOSIS — E039 Hypothyroidism, unspecified: Secondary | ICD-10-CM

## 2016-12-23 DIAGNOSIS — Z882 Allergy status to sulfonamides status: Secondary | ICD-10-CM

## 2016-12-23 DIAGNOSIS — R339 Retention of urine, unspecified: Secondary | ICD-10-CM | POA: Diagnosis not present

## 2016-12-23 DIAGNOSIS — K5730 Diverticulosis of large intestine without perforation or abscess without bleeding: Secondary | ICD-10-CM | POA: Diagnosis not present

## 2016-12-23 DIAGNOSIS — C642 Malignant neoplasm of left kidney, except renal pelvis: Secondary | ICD-10-CM | POA: Diagnosis not present

## 2016-12-23 DIAGNOSIS — N400 Enlarged prostate without lower urinary tract symptoms: Secondary | ICD-10-CM

## 2016-12-23 DIAGNOSIS — E872 Acidosis: Secondary | ICD-10-CM | POA: Diagnosis not present

## 2016-12-23 DIAGNOSIS — N183 Chronic kidney disease, stage 3 (moderate): Secondary | ICD-10-CM | POA: Diagnosis not present

## 2016-12-23 DIAGNOSIS — Z905 Acquired absence of kidney: Secondary | ICD-10-CM | POA: Insufficient documentation

## 2016-12-23 DIAGNOSIS — E861 Hypovolemia: Secondary | ICD-10-CM | POA: Diagnosis not present

## 2016-12-23 DIAGNOSIS — I129 Hypertensive chronic kidney disease with stage 1 through stage 4 chronic kidney disease, or unspecified chronic kidney disease: Secondary | ICD-10-CM | POA: Diagnosis not present

## 2016-12-23 DIAGNOSIS — K432 Incisional hernia without obstruction or gangrene: Secondary | ICD-10-CM | POA: Insufficient documentation

## 2016-12-23 DIAGNOSIS — J90 Pleural effusion, not elsewhere classified: Secondary | ICD-10-CM | POA: Diagnosis not present

## 2016-12-23 DIAGNOSIS — K219 Gastro-esophageal reflux disease without esophagitis: Secondary | ICD-10-CM | POA: Insufficient documentation

## 2016-12-23 DIAGNOSIS — R6510 Systemic inflammatory response syndrome (SIRS) of non-infectious origin without acute organ dysfunction: Secondary | ICD-10-CM | POA: Diagnosis not present

## 2016-12-23 DIAGNOSIS — M1990 Unspecified osteoarthritis, unspecified site: Secondary | ICD-10-CM

## 2016-12-23 DIAGNOSIS — N179 Acute kidney failure, unspecified: Secondary | ICD-10-CM | POA: Diagnosis not present

## 2016-12-23 DIAGNOSIS — A419 Sepsis, unspecified organism: Secondary | ICD-10-CM | POA: Diagnosis not present

## 2016-12-23 DIAGNOSIS — Z79899 Other long term (current) drug therapy: Secondary | ICD-10-CM | POA: Diagnosis not present

## 2016-12-23 DIAGNOSIS — E7800 Pure hypercholesterolemia, unspecified: Secondary | ICD-10-CM | POA: Insufficient documentation

## 2016-12-23 DIAGNOSIS — M25462 Effusion, left knee: Secondary | ICD-10-CM | POA: Diagnosis not present

## 2016-12-23 DIAGNOSIS — I10 Essential (primary) hypertension: Secondary | ICD-10-CM

## 2016-12-23 DIAGNOSIS — R0602 Shortness of breath: Secondary | ICD-10-CM | POA: Diagnosis not present

## 2016-12-23 DIAGNOSIS — E860 Dehydration: Secondary | ICD-10-CM | POA: Diagnosis not present

## 2016-12-23 DIAGNOSIS — R6520 Severe sepsis without septic shock: Secondary | ICD-10-CM | POA: Diagnosis not present

## 2016-12-23 DIAGNOSIS — M25461 Effusion, right knee: Secondary | ICD-10-CM | POA: Diagnosis not present

## 2016-12-23 DIAGNOSIS — E876 Hypokalemia: Secondary | ICD-10-CM | POA: Diagnosis not present

## 2016-12-23 HISTORY — PX: INCISIONAL HERNIA REPAIR: SHX193

## 2016-12-23 HISTORY — PX: INSERTION OF MESH: SHX5868

## 2016-12-23 SURGERY — REPAIR, HERNIA, INCISIONAL, LAPAROSCOPIC
Anesthesia: General | Site: Abdomen

## 2016-12-23 MED ORDER — FENTANYL CITRATE (PF) 100 MCG/2ML IJ SOLN
INTRAMUSCULAR | Status: AC
  Filled 2016-12-23: qty 2

## 2016-12-23 MED ORDER — EVICEL 5 ML EX KIT
PACK | CUTANEOUS | Status: AC
  Filled 2016-12-23: qty 1

## 2016-12-23 MED ORDER — MIDAZOLAM HCL 5 MG/5ML IJ SOLN
INTRAMUSCULAR | Status: DC | PRN
  Administered 2016-12-23: 2 mg via INTRAVENOUS

## 2016-12-23 MED ORDER — SODIUM CHLORIDE 0.9 % IV SOLN
INTRAVENOUS | Status: DC | PRN
  Administered 2016-12-23: 11:00:00 via INTRAVENOUS

## 2016-12-23 MED ORDER — 0.9 % SODIUM CHLORIDE (POUR BTL) OPTIME
TOPICAL | Status: DC | PRN
  Administered 2016-12-23: 1000 mL

## 2016-12-23 MED ORDER — ROCURONIUM BROMIDE 100 MG/10ML IV SOLN
INTRAVENOUS | Status: DC | PRN
  Administered 2016-12-23: 50 mg via INTRAVENOUS

## 2016-12-23 MED ORDER — ACETAMINOPHEN 10 MG/ML IV SOLN
1000.0000 mg | Freq: Once | INTRAVENOUS | Status: AC
  Administered 2016-12-23: 1000 mg via INTRAVENOUS
  Filled 2016-12-23: qty 100

## 2016-12-23 MED ORDER — OXYCODONE-ACETAMINOPHEN 5-325 MG PO TABS: 1.0000 | ORAL_TABLET | ORAL | 0 refills | Status: DC | PRN

## 2016-12-23 MED ORDER — DEXAMETHASONE SODIUM PHOSPHATE 10 MG/ML IJ SOLN
INTRAMUSCULAR | Status: AC
  Filled 2016-12-23: qty 1

## 2016-12-23 MED ORDER — LIDOCAINE 2% (20 MG/ML) 5 ML SYRINGE
INTRAMUSCULAR | Status: AC
  Filled 2016-12-23: qty 5

## 2016-12-23 MED ORDER — CHLORHEXIDINE GLUCONATE CLOTH 2 % EX PADS: 6.0000 | MEDICATED_PAD | Freq: Once | CUTANEOUS | Status: DC

## 2016-12-23 MED ORDER — SODIUM CHLORIDE 0.9 % IV SOLN: 250.0000 mL | INTRAVENOUS | Status: DC | PRN

## 2016-12-23 MED ORDER — ACETAMINOPHEN 650 MG RE SUPP
650.0000 mg | RECTAL | Status: DC | PRN
  Filled 2016-12-23: qty 1

## 2016-12-23 MED ORDER — ACETAMINOPHEN 325 MG PO TABS
650.0000 mg | ORAL_TABLET | ORAL | Status: DC | PRN
  Filled 2016-12-23: qty 2

## 2016-12-23 MED ORDER — PROMETHAZINE HCL 25 MG/ML IJ SOLN: 6.2500 mg | INTRAMUSCULAR | Status: DC | PRN

## 2016-12-23 MED ORDER — HYDROMORPHONE HCL 1 MG/ML IJ SOLN
INTRAMUSCULAR | Status: AC
  Filled 2016-12-23: qty 0.5

## 2016-12-23 MED ORDER — ACETAMINOPHEN 10 MG/ML IV SOLN
INTRAVENOUS | Status: AC
  Filled 2016-12-23: qty 100

## 2016-12-23 MED ORDER — HYDROMORPHONE HCL 1 MG/ML IJ SOLN
0.2500 mg | INTRAMUSCULAR | Status: DC | PRN
  Administered 2016-12-23: 0.25 mg via INTRAVENOUS

## 2016-12-23 MED ORDER — ROCURONIUM BROMIDE 50 MG/5ML IV SOSY
PREFILLED_SYRINGE | INTRAVENOUS | Status: AC
  Filled 2016-12-23: qty 5

## 2016-12-23 MED ORDER — LIDOCAINE HCL (CARDIAC) 20 MG/ML IV SOLN
INTRAVENOUS | Status: DC | PRN
  Administered 2016-12-23: 100 mg via INTRAVENOUS

## 2016-12-23 MED ORDER — PROPOFOL 10 MG/ML IV BOLUS
INTRAVENOUS | Status: AC
  Filled 2016-12-23: qty 20

## 2016-12-23 MED ORDER — MIDAZOLAM HCL 2 MG/2ML IJ SOLN
INTRAMUSCULAR | Status: AC
  Filled 2016-12-23: qty 2

## 2016-12-23 MED ORDER — OXYCODONE HCL 5 MG PO TABS
5.0000 mg | ORAL_TABLET | ORAL | Status: DC | PRN
  Administered 2016-12-23: 5 mg via ORAL

## 2016-12-23 MED ORDER — LACTATED RINGERS IV SOLN
INTRAVENOUS | Status: DC
  Administered 2016-12-23: 50 mL/h via INTRAVENOUS

## 2016-12-23 MED ORDER — ONDANSETRON HCL 4 MG/2ML IJ SOLN
INTRAMUSCULAR | Status: DC | PRN
  Administered 2016-12-23: 4 mg via INTRAVENOUS

## 2016-12-23 MED ORDER — EVICEL 5 ML EX KIT
PACK | CUTANEOUS | Status: DC | PRN
Start: 2016-12-23 — End: 2016-12-23
  Administered 2016-12-23: 5 mL

## 2016-12-23 MED ORDER — BUPIVACAINE HCL (PF) 0.25 % IJ SOLN
INTRAMUSCULAR | Status: AC
  Filled 2016-12-23: qty 30

## 2016-12-23 MED ORDER — SUGAMMADEX SODIUM 200 MG/2ML IV SOLN
INTRAVENOUS | Status: DC | PRN
  Administered 2016-12-23: 200 mg via INTRAVENOUS

## 2016-12-23 MED ORDER — SODIUM CHLORIDE 0.9 % IJ SOLN
INTRAMUSCULAR | Status: AC
  Filled 2016-12-23: qty 10

## 2016-12-23 MED ORDER — DEXTROSE 5 % IV SOLN
INTRAVENOUS | Status: DC | PRN
  Administered 2016-12-23: 25 ug/min via INTRAVENOUS

## 2016-12-23 MED ORDER — SCOPOLAMINE 1 MG/3DAYS TD PT72
1.0000 | MEDICATED_PATCH | TRANSDERMAL | Status: DC
  Administered 2016-12-23: 1.5 mg via TRANSDERMAL

## 2016-12-23 MED ORDER — VECURONIUM BROMIDE 10 MG IV SOLR
INTRAVENOUS | Status: AC
  Filled 2016-12-23: qty 10

## 2016-12-23 MED ORDER — FENTANYL CITRATE (PF) 100 MCG/2ML IJ SOLN
INTRAMUSCULAR | Status: DC | PRN
  Administered 2016-12-23: 100 ug via INTRAVENOUS
  Administered 2016-12-23 (×2): 50 ug via INTRAVENOUS

## 2016-12-23 MED ORDER — PROPOFOL 10 MG/ML IV BOLUS
INTRAVENOUS | Status: DC | PRN
  Administered 2016-12-23: 170 mg via INTRAVENOUS

## 2016-12-23 MED ORDER — DEXAMETHASONE SODIUM PHOSPHATE 10 MG/ML IJ SOLN
INTRAMUSCULAR | Status: DC | PRN
  Administered 2016-12-23: 10 mg via INTRAVENOUS

## 2016-12-23 MED ORDER — GLYCOPYRROLATE 0.2 MG/ML IJ SOLN
INTRAMUSCULAR | Status: DC | PRN
  Administered 2016-12-23: 0.4 mg via INTRAVENOUS

## 2016-12-23 MED ORDER — SODIUM CHLORIDE 0.9% FLUSH: 3.0000 mL | INTRAVENOUS | Status: DC | PRN

## 2016-12-23 MED ORDER — MORPHINE SULFATE (PF) 2 MG/ML IV SOLN: 2.0000 mg | INTRAVENOUS | Status: DC | PRN

## 2016-12-23 MED ORDER — VECURONIUM BROMIDE 10 MG IV SOLR
INTRAVENOUS | Status: DC | PRN
  Administered 2016-12-23: 2 mg via INTRAVENOUS

## 2016-12-23 MED ORDER — CEFAZOLIN SODIUM-DEXTROSE 2-4 GM/100ML-% IV SOLN
2.0000 g | INTRAVENOUS | Status: AC
  Administered 2016-12-23: 2 g via INTRAVENOUS

## 2016-12-23 MED ORDER — SUGAMMADEX SODIUM 200 MG/2ML IV SOLN
INTRAVENOUS | Status: AC
  Filled 2016-12-23: qty 2

## 2016-12-23 MED ORDER — SODIUM CHLORIDE 0.9% FLUSH: 3.0000 mL | Freq: Two times a day (BID) | INTRAVENOUS | Status: DC

## 2016-12-23 MED ORDER — SODIUM CHLORIDE 0.9 % IR SOLN: Status: DC | PRN

## 2016-12-23 MED ORDER — SCOPOLAMINE 1 MG/3DAYS TD PT72
MEDICATED_PATCH | TRANSDERMAL | Status: AC
  Filled 2016-12-23: qty 1

## 2016-12-23 MED ORDER — CEFAZOLIN SODIUM-DEXTROSE 2-4 GM/100ML-% IV SOLN
INTRAVENOUS | Status: AC
  Filled 2016-12-23: qty 100

## 2016-12-23 MED ORDER — ONDANSETRON HCL 4 MG/2ML IJ SOLN
INTRAMUSCULAR | Status: AC
  Filled 2016-12-23: qty 2

## 2016-12-23 MED ORDER — HYDROMORPHONE HCL 1 MG/ML IJ SOLN
0.2500 mg | INTRAMUSCULAR | Status: DC | PRN
  Administered 2016-12-23: 0.5 mg via INTRAVENOUS

## 2016-12-23 MED ORDER — OXYCODONE HCL 5 MG PO TABS
ORAL_TABLET | ORAL | Status: AC
  Filled 2016-12-23: qty 1

## 2016-12-23 SURGICAL SUPPLY — 59 items
APL SKNCLS STERI-STRIP NONHPOA (GAUZE/BANDAGES/DRESSINGS) ×2
APPLIER CLIP LOGIC TI 5 (MISCELLANEOUS) IMPLANT
APR CLP MED LRG 33X5 (MISCELLANEOUS)
BENZOIN TINCTURE PRP APPL 2/3 (GAUZE/BANDAGES/DRESSINGS) ×4 IMPLANT
BLADE SURG ROTATE 9660 (MISCELLANEOUS) IMPLANT
CANISTER SUCTION 2500CC (MISCELLANEOUS) ×2 IMPLANT
CHLORAPREP W/TINT 26ML (MISCELLANEOUS) ×4 IMPLANT
CLOSURE STERI-STRIP 1/2X4 (GAUZE/BANDAGES/DRESSINGS) ×1
CLSR STERI-STRIP ANTIMIC 1/2X4 (GAUZE/BANDAGES/DRESSINGS) ×1 IMPLANT
COVER SURGICAL LIGHT HANDLE (MISCELLANEOUS) ×4 IMPLANT
DEVICE RELIATACK FIXATION (MISCELLANEOUS) ×4 IMPLANT
DEVICE SECURE STRAP 25 ABSORB (INSTRUMENTS) IMPLANT
DEVICE TROCAR PUNCTURE CLOSURE (ENDOMECHANICALS) ×4 IMPLANT
DRAPE LAPAROSCOPIC ABDOMINAL (DRAPES) ×4 IMPLANT
ELECT REM PT RETURN 9FT ADLT (ELECTROSURGICAL) ×4
ELECTRODE REM PT RTRN 9FT ADLT (ELECTROSURGICAL) ×2 IMPLANT
GAUZE SPONGE 2X2 8PLY STRL LF (GAUZE/BANDAGES/DRESSINGS) ×2 IMPLANT
GLOVE BIO SURGEON STRL SZ7.5 (GLOVE) ×4 IMPLANT
GOWN STRL REUS W/ TWL LRG LVL3 (GOWN DISPOSABLE) ×4 IMPLANT
GOWN STRL REUS W/ TWL XL LVL3 (GOWN DISPOSABLE) ×2 IMPLANT
GOWN STRL REUS W/TWL LRG LVL3 (GOWN DISPOSABLE) ×8
GOWN STRL REUS W/TWL XL LVL3 (GOWN DISPOSABLE) ×4
KIT BASIN OR (CUSTOM PROCEDURE TRAY) ×4 IMPLANT
KIT ROOM TURNOVER OR (KITS) ×4 IMPLANT
MARKER SKIN DUAL TIP RULER LAB (MISCELLANEOUS) ×2 IMPLANT
MESH VENTRALIGHT ST 6X8 (Mesh Specialty) ×4 IMPLANT
MESH VENTRLGHT ELLIPSE 8X6XMFL (Mesh Specialty) IMPLANT
NDL INSUFFLATION 14GA 120MM (NEEDLE) ×2 IMPLANT
NDL SPNL 22GX3.5 QUINCKE BK (NEEDLE) IMPLANT
NEEDLE INSUFFLATION 14GA 120MM (NEEDLE) ×4 IMPLANT
NEEDLE SPNL 22GX3.5 QUINCKE BK (NEEDLE) ×4 IMPLANT
NS IRRIG 1000ML POUR BTL (IV SOLUTION) ×4 IMPLANT
PAD ARMBOARD 7.5X6 YLW CONV (MISCELLANEOUS) ×8 IMPLANT
RELOAD ENDO RELIATCK 10 HERNIA (MISCELLANEOUS) IMPLANT
RELOAD ENDO RELIATCK 5 HERNIA (MISCELLANEOUS) IMPLANT
RELOAD RELIATACK 10 (MISCELLANEOUS) IMPLANT
RELOAD RELIATACK 5 (MISCELLANEOUS) IMPLANT
SCALPEL HARMONIC ACE (MISCELLANEOUS) IMPLANT
SCISSORS LAP 5X35 DISP (ENDOMECHANICALS) ×4 IMPLANT
SET IRRIG TUBING LAPAROSCOPIC (IRRIGATION / IRRIGATOR) IMPLANT
SLEEVE ENDOPATH XCEL 5M (ENDOMECHANICALS) ×10 IMPLANT
SPONGE GAUZE 2X2 STER 10/PKG (GAUZE/BANDAGES/DRESSINGS) ×2
SUT CHROMIC 2 0 SH (SUTURE) ×4 IMPLANT
SUT MNCRL AB 3-0 PS2 18 (SUTURE) ×4 IMPLANT
SUT NOVA NAB DX-16 0-1 5-0 T12 (SUTURE) IMPLANT
SUT NOVA NAB GS-21 0 18 T12 DT (SUTURE) ×4 IMPLANT
SUT PROLENE 2 0 KS (SUTURE) IMPLANT
SUT VIC AB 2-0 SH 27 (SUTURE) ×4
SUT VIC AB 2-0 SH 27XBRD (SUTURE) IMPLANT
SUT VLOC 180 0 24IN GS25 (SUTURE) ×2 IMPLANT
TAPE CLOTH SURG 4X10 WHT LF (GAUZE/BANDAGES/DRESSINGS) ×2 IMPLANT
TIP RIGID 35CM EVICEL (HEMOSTASIS) ×2 IMPLANT
TOWEL OR 17X24 6PK STRL BLUE (TOWEL DISPOSABLE) ×4 IMPLANT
TOWEL OR 17X26 10 PK STRL BLUE (TOWEL DISPOSABLE) ×4 IMPLANT
TRAY FOLEY CATH 16FR SILVER (SET/KITS/TRAYS/PACK) IMPLANT
TRAY LAPAROSCOPIC MC (CUSTOM PROCEDURE TRAY) ×4 IMPLANT
TROCAR XCEL NON-BLD 11X100MML (ENDOMECHANICALS) IMPLANT
TROCAR XCEL NON-BLD 5MMX100MML (ENDOMECHANICALS) ×4 IMPLANT
TUBING INSUFFLATION (TUBING) ×4 IMPLANT

## 2016-12-23 NOTE — H&P (Signed)
The patient is a 71 year old male who presents with an incisional hernia. Patient is a 71 year old male who is referred by Dr. Dutch Gray for evaluation of a incisional hernia. Patient had a left nephrectomy secondary to renal cell carcinoma in June 2017. Patient states that prostate fortify months after surgery he noticed a large bulge in his midline incision. Patient states she does have some achiness at times. He is active. He's had no signs or symptoms of incarceration or strangulation.  Patient does not smoke. Patient does not take steroids. Of note the patient does have a history of 3 recurrent inguinal hernias in the past. All repaired in open fashion. Patient also has had a right inguinal hernia repair in open fashion.   Past Surgical History Appendectomy  Colon Polyp Removal - Colonoscopy  Hemorrhoidectomy  Nephrectomy  Left. Open Inguinal Hernia Surgery  Bilateral. multiple Thyroid Surgery  Tonsillectomy   Diagnostic Studies History Colonoscopy  1-5 years ago  Allergies  Sulfa Antibiotics  Rash.  Medication History  Atorvastatin Calcium (20MG  Tablet, Oral daily) Active. PARoxetine HCl ER (25MG  Tablet ER 24HR, Oral daily) Active. Atenolol-Chlorthalidone (50-25MG  Tablet, Oral daily) Active. Klor-Con M20 [facility] Tablet ER, Oral daily) Active. Synthroid (125MCG Tablet, Oral daily) Active. Prevacid (15MG  Capsule DR, Oral daily) Active. Medications Reconciled  Social History  Caffeine use  Coffee. No alcohol use  No drug use  Tobacco use  Never smoker.  Family History  Arthritis  Father. Cerebrovascular Accident  Father. Heart Disease  Brother, Father. Heart disease in male family member before age 3  Hypertension  Father, Mother.  Other Problems  Arthritis  Back Pain  Cancer  Enlarged Prostate  Gastroesophageal Reflux Disease  Hemorrhoids  Hypercholesterolemia  Inguinal Hernia  Other disease, cancer,  significant illness  Thyroid Cancer  Thyroid Disease     Review of Systems  General Not Present- Appetite Loss, Chills, Fatigue, Fever, Night Sweats, Weight Gain and Weight Loss. Skin Not Present- Change in Wart/Mole, Dryness, Hives, Jaundice, New Lesions, Non-Healing Wounds, Rash and Ulcer. HEENT Present- Hearing Loss, Ringing in the Ears and Wears glasses/contact lenses. Not Present- Earache, Hoarseness, Nose Bleed, Oral Ulcers, Seasonal Allergies, Sinus Pain, Sore Throat, Visual Disturbances and Yellow Eyes. Respiratory Not Present- Bloody sputum, Chronic Cough, Difficulty Breathing, Snoring and Wheezing. Cardiovascular Not Present- Chest Pain, Difficulty Breathing Lying Down, Leg Cramps, Palpitations, Rapid Heart Rate, Shortness of Breath and Swelling of Extremities. Gastrointestinal Not Present- Abdominal Pain, Bloating, Bloody Stool, Change in Bowel Habits, Chronic diarrhea, Constipation, Difficulty Swallowing, Excessive gas, Gets full quickly at meals, Hemorrhoids, Indigestion, Nausea, Rectal Pain and Vomiting. Male Genitourinary Present- Frequency and Impotence. Not Present- Blood in Urine, Change in Urinary Stream, Nocturia, Painful Urination, Urgency and Urine Leakage. Musculoskeletal Present- Back Pain and Joint Pain. Not Present- Joint Stiffness, Muscle Pain, Muscle Weakness and Swelling of Extremities. Neurological Not Present- Decreased Memory, Fainting, Headaches, Numbness, Seizures, Tingling, Tremor, Trouble walking and Weakness. Psychiatric Not Present- Anxiety, Bipolar, Change in Sleep Pattern, Depression, Fearful and Frequent crying.  BP 127/69   Pulse (!) 58   Temp 98.9 F (37.2 C) (Oral)   Resp 20   SpO2 98%     Physical Exam  General Mental Status-Alert. General Appearance-Consistent with stated age. Hydration-Well hydrated. Voice-Normal.  Head and Neck Head-normocephalic, atraumatic with no lesions or palpable  masses. Trachea-midline. Thyroid Gland Characteristics - normal size and consistency.  Chest and Lung Exam Chest and lung exam reveals -quiet, even and easy respiratory effort with no use of  accessory muscles and on auscultation, normal breath sounds, no adventitious sounds and normal vocal resonance. Inspection Chest Wall - Normal. Back - normal.  Cardiovascular Cardiovascular examination reveals -normal heart sounds, regular rate and rhythm with no murmurs and normal pedal pulses bilaterally.  Abdomen Inspection Skin - Scar - no surgical scars. Hernias - Incisional - Reducible(Midline, periumbilical Appears to be about 4 x 5 cm, reducible, also has a umbilical hernia). Palpation/Percussion Normal exam - Soft, Non Tender, No Rebound tenderness, No Rigidity (guarding) and No hepatosplenomegaly. Auscultation Normal exam - Bowel sounds normal.    Assessment & Plan  INCISIONAL HERNIA, WITHOUT OBSTRUCTION OR GANGRENE (K43.2) Impression: Patient is a 71 year old male with an incisional hernia  1. We will proceed to the operating for a lap versus open incisional hernia repair with mesh. 2. All risks and benefits were discussed with the patient to generally include, but not limited to: infection, bleeding, damage to surrounding structures, acute and chronic nerve pain, and recurrence. Alternatives were offered and described. All questions were answered and the patient voiced understanding of the procedure and wishes to proceed at this point with hernia repair.The patient is a 71 year old male who presents with an incisional hernia. Patient is a

## 2016-12-23 NOTE — Discharge Instructions (Signed)
[facility] ° °HERNIA REPAIR: POST OP INSTRUCTIONS ° °Always review your discharge instruction sheet given to you by the facility where your surgery was performed. °IF YOU HAVE DISABILITY OR FAMILY LEAVE FORMS, YOU MUST BRING THEM TO THE OFFICE FOR PROCESSING.   °DO NOT GIVE THEM TO YOUR DOCTOR. ° °1. A  prescription for pain medication may be given to you upon discharge.  Take your pain medication as prescribed, if needed.  If narcotic pain medicine is not needed, then you may take acetaminophen (Tylenol) or ibuprofen (Advil) as needed. °2. Take your usually prescribed medications unless otherwise directed. °If you need a refill on your pain medication, please contact your pharmacy.  They will contact our office to request authorization. Prescriptions will not be filled after 5 pm or on week-ends. °3. You should follow a light diet the first 24 hours after arrival home, such as soup and crackers, etc.  Be sure to include lots of fluids daily.  Resume your normal diet the day after surgery. °4.Most patients will experience some swelling and bruising around the umbilicus or in the groin and scrotum.  Ice packs and reclining will help.  Swelling and bruising can take several days to resolve.  °6. It is common to experience some constipation if taking pain medication after surgery.  Increasing fluid intake and taking a stool softener (such as Colace) will usually help or prevent this problem from occurring.  A mild laxative (Milk of Magnesia or Miralax) should be taken according to package directions if there are no bowel movements after 48 hours. °7. Unless discharge instructions indicate otherwise, you may remove your bandages 24-48 hours after surgery, and you may shower at that time.  You may have steri-strips (small skin tapes) in place directly over the incision.  These strips should be left on the skin for 7-10 days.  If your surgeon used skin glue on the incision, you may shower in  24 hours.  The glue will flake off over the next 2-3 weeks.  Any sutures or staples will be removed at the office during your follow-up visit. °8. ACTIVITIES:  You may resume regular (light) daily activities beginning the next day--such as daily self-care, walking, climbing stairs--gradually increasing activities as tolerated.  You may have sexual intercourse when it is comfortable.  Refrain from any heavy lifting or straining until approved by your doctor. ° °a.You may drive when you are no longer taking prescription pain medication, you can comfortably wear a seatbelt, and you can safely maneuver your car and apply brakes. °b.RETURN TO WORK:   °_____________________________________________ ° °9.You should see your doctor in the office for a follow-up appointment approximately 2-3 weeks after your surgery.  Make sure that you call for this appointment within a day or two after you arrive home to insure a convenient appointment time. °10.OTHER INSTRUCTIONS: _________________________ °   _____________________________________ ° °WHEN TO CALL YOUR DOCTOR: °1. Fever over 101.0 °2. Inability to urinate °3. Nausea and/or vomiting °4. Extreme swelling or bruising °5. Continued bleeding from incision. °6. Increased pain, redness, or drainage from the incision ° °The clinic staff is available to answer your questions during regular business hours.  Please don’t hesitate to call and ask to speak to one of the nurses for clinical concerns.  If you have a medical emergency, go to the nearest emergency room or call 911.  A surgeon from [facility] is always on call at the hospital ° ° °[address], [address], [place], [place]  [address] ? ° [address] [address], [place], [place]   [address] °[phone] ? [phone] ? FAX [phone] °Web site: [email or web address] ° °

## 2016-12-23 NOTE — Anesthesia Procedure Notes (Signed)
Procedure Name: Intubation Date/Time: 12/23/2016 9:30 AM Performed by: Kyung Rudd Pre-anesthesia Checklist: Patient identified, Emergency Drugs available, Suction available and Patient being monitored Patient Re-evaluated:Patient Re-evaluated prior to inductionOxygen Delivery Method: Circle system utilized Preoxygenation: Pre-oxygenation with 100% oxygen Intubation Type: IV induction Ventilation: Mask ventilation without difficulty Laryngoscope Size: Mac and 4 Grade View: Grade I Tube type: Oral Tube size: 7.5 mm Number of attempts: 1 Placement Confirmation: ETT inserted through vocal cords under direct vision,  positive ETCO2 and breath sounds checked- equal and bilateral Secured at: 22 cm Tube secured with: Tape Dental Injury: Teeth and Oropharynx as per pre-operative assessment

## 2016-12-23 NOTE — Anesthesia Postprocedure Evaluation (Addendum)
Anesthesia Post Note  Patient: Eric Suarez  Procedure(s) Performed: Procedure(s) (LRB): LAPAROSCOPIC VERSUS OPEN LYSIS OF ADHESIONS AND INCISIONAL HERNIA REPAIR WITH MESH (N/A) INSERTION OF MESH (N/A)  Patient location during evaluation: PACU Anesthesia Type: General Level of consciousness: sedated Pain management: pain level controlled Vital Signs Assessment: post-procedure vital signs reviewed and stable Respiratory status: spontaneous breathing and respiratory function stable Cardiovascular status: stable Anesthetic complications: no       Last Vitals:  Vitals:   12/23/16 1215 12/23/16 1230  BP:    Pulse: 74 73  Resp: 14 18  Temp:      Last Pain:  Vitals:   12/23/16 1230  TempSrc:   PainSc: 5                  Mikeala Girdler DANIEL

## 2016-12-23 NOTE — Transfer of Care (Signed)
Immediate Anesthesia Transfer of Care Note  Patient: Eric Suarez  Procedure(s) Performed: Procedure(s): LAPAROSCOPIC VERSUS OPEN LYSIS OF ADHESIONS AND INCISIONAL HERNIA REPAIR WITH MESH (N/A) INSERTION OF MESH (N/A)  Patient Location: PACU  Anesthesia Type:General  Level of Consciousness: awake, alert  and oriented  Airway & Oxygen Therapy: Patient Spontanous Breathing and Patient connected to face mask oxygen  Post-op Assessment: Report given to RN, Post -op Vital signs reviewed and stable and Patient moving all extremities X 4  Post vital signs: Reviewed and stable  Last Vitals:  Vitals:   12/23/16 0741 12/23/16 1130  BP: 127/69   Pulse: (!) 58   Resp: 20   Temp: 37.2 C (P) 36.1 C    Last Pain:  Vitals:   12/23/16 0741  TempSrc: Oral      Patients Stated Pain Goal: 8 ([id number])  Complications: No apparent anesthesia complications

## 2016-12-23 NOTE — Op Note (Signed)
12/23/2016  11:16 AM  PATIENT:  Eric Suarez  71 y.o. male  PRE-OPERATIVE DIAGNOSIS:  INCISIONAL HERNIA  POST-OPERATIVE DIAGNOSIS:  INCISIONAL HERNIA  PROCEDURE:  Procedure(s): LAPAROSCOPIC BILATERAL MUSCULOCUTANEOUS ADVANCEMENT FLAPS, INCISIONAL HERNIA REPAIR WITH MESH (N/A)  SURGEON:  Surgeon(s) and Role:    * Ralene Ok, MD - Primary ANESTHESIA:   local and general  EBL:  Total I/O In: 1000 [I.V.:1000] Out: 25 [Blood:25]  BLOOD ADMINISTERED:none  DRAINS: none   LOCAL MEDICATIONS USED:  BUPIVICAINE   SPECIMEN:  No Specimen  DISPOSITION OF SPECIMEN:  N/A  COUNTS:  YES  TOURNIQUET:  * No tourniquets in log *  DICTATION: .Dragon Dictation  Indications for procedure: Patient is a 71 year old male with a previous robotic partial nephrectomy. Subsequently patient developed a incisional hernia at his umbilical extraction site. Patient was seen in clinic and decided to have his hernia electively repaired.  Details of procedure: After the patient was consented he was taken back to the operating room and placed in supine position with bilateral SCDs in place. After appropriate antibiotic for confirmatory was called all facts verified.  At this time a Veress needle technique was used to insufflate the abdomen to 15 mmHg in the left subcostal margin.. Subsequently to this, trocar and camera placed intra-abdominally. There is no injury to major abdominal organs. Upon visualizing the midline. The hernia appeared to be no incarceration. At this time a Optiview technique was used to enter the retrorectus left side using the same incision site. At this time insufflation was begun in dissection of the left rectus muscle off the posterior rectus sheath was undertaken. This dissection was taken inferiorly on the left side. The left rectus muscle easily dissected away from the posterior rectus sheath. I proceeded to dissect medially. There was a small approximately 3 cm area of  violation of the posterior rectus sheath. The hernia was easily seen.  At this time I took down the area falciform ligament. The crossover then was undertaken in the falciform area. The right rectus muscle could easily be seen at this time the right rectus posterior fascia was incised. This enabled me to create a right posterior rectus sheath space. I then proceeded to dissect this inferiorly towards the hernia sac. The hernia sac was then dissected away from the rectus diastases. The subcutaneous fascia. The hernia sac entirely reduced. I proceeded to create the space inferiorly in the midline. This was taken down to approximately 8 cm from the inferior portion of the hernia.  At this time the anterior rectus fascia was reapproximated in the midline using oh Novafils and Endo Close device. 2 stab incisions were made both above and below the umbilicus. This allowed me to reapproximate the anterior fascia throughout the length of the hernia, as well as reapproximating the rectus diastases.  At this time a 2-0 Vicryl using a running locking fashion to reapproximate the area of a violation of the posterior rectus sheath.  This reapproximated the posterior fascia appropriately. At this time or regurgitation the pre-peritoneal space. The space created measured approximately 15 x 20 cm in size.  At this time a Bard ventral light ST mesh was chosen and the 15 x 20 cm size. This was introduced into the retrorectus space. At this time this was unfolded and filled the space appropriately overlapping the hernia by more than 5 cm. At this time episode was used to fixate the mesh to the posterior rectus sheath. Insufflation was evacuated. The mesh was seen opposing  the posterior rectus muscle.  At this time all trochars removed. Insufflation was evacuated. The skin was reapproximated using 4-0 Monocryl subcuticular fashion. The skin was dressed Steri-Strips gauze and tape.  The patient tied the procedure well was  taken to the recovery room in stable condition.   PLAN OF CARE: Discharge to home after PACU  PATIENT DISPOSITION:  PACU - hemodynamically stable.   Delay start of Pharmacological VTE agent (>24hrs) due to surgical blood loss or risk of bleeding: not applicable

## 2016-12-23 NOTE — Anesthesia Preprocedure Evaluation (Addendum)
Anesthesia Evaluation  Patient identified by MRN, date of birth, ID band Patient awake    Reviewed: Allergy & Precautions, NPO status , Patient's Chart, lab work & pertinent test results  History of Anesthesia Complications (+) PONV and history of anesthetic complications  Airway Mallampati: I  TM Distance: >3 FB Neck ROM: Full    Dental no notable dental hx. (+) Dental Advisory Given, Implants,    Pulmonary    Pulmonary exam normal        Cardiovascular hypertension, Pt. on medications + CAD  Normal cardiovascular exam     Neuro/Psych    GI/Hepatic GERD  Medicated and Controlled,  Endo/Other  Hypothyroidism   Renal/GU Renal hypertension and Renal InsufficiencyRenal diseasenegative Renal ROS     Musculoskeletal   Abdominal   Peds  Hematology   Anesthesia Other Findings   Reproductive/Obstetrics                            Anesthesia Physical  Anesthesia Plan  ASA: II  Anesthesia Plan: General   Post-op Pain Management:    Induction: Intravenous  Airway Management Planned: Oral ETT  Additional Equipment:   Intra-op Plan:   Post-operative Plan: Extubation in OR  Informed Consent: I have reviewed the patients History and Physical, chart, labs and discussed the procedure including the risks, benefits and alternatives for the proposed anesthesia with the patient or authorized representative who has indicated his/her understanding and acceptance.   Dental advisory given  Plan Discussed with: CRNA and Surgeon  Anesthesia Plan Comments:         Anesthesia Quick Evaluation

## 2016-12-23 NOTE — Progress Notes (Signed)
Patient still unable to void spontaneously and agreeing to have foley placed so he can be discharged home. 16 french urinary catheter placed in pacu bay 19. Sterile technique used and inserted with help of Ly, Rn. Foley inserted with no resistance and clear yellow urine returned. Patient will be discharged home with foley per plan discussed with Dr Rosendo Gros.

## 2016-12-23 NOTE — Progress Notes (Signed)
Patient continues to want to wait in PACU until able to void. Pt attempting to void again at 1730. Unable to void spontaneously. Will continue to ambulate and attempt to void until 9pm. At 9pm if patient unable to void he is aware of plan to get foley catheter and leg bag for home.

## 2016-12-24 ENCOUNTER — Encounter (HOSPITAL_COMMUNITY): Payer: Self-pay | Admitting: General Surgery

## 2016-12-25 ENCOUNTER — Encounter (HOSPITAL_COMMUNITY): Payer: Self-pay

## 2016-12-25 ENCOUNTER — Emergency Department (HOSPITAL_COMMUNITY): Payer: PPO

## 2016-12-25 ENCOUNTER — Inpatient Hospital Stay (HOSPITAL_COMMUNITY)
Admission: EM | Admit: 2016-12-25 | Discharge: 2016-12-27 | DRG: 854 | Disposition: A | Discharge status: Home / Self Care | Payer: PPO | Attending: Internal Medicine | Admitting: Internal Medicine

## 2016-12-25 DIAGNOSIS — R0602 Shortness of breath: Secondary | ICD-10-CM | POA: Diagnosis not present

## 2016-12-25 DIAGNOSIS — E872 Acidosis, unspecified: Secondary | ICD-10-CM

## 2016-12-25 DIAGNOSIS — Z978 Presence of other specified devices: Secondary | ICD-10-CM

## 2016-12-25 DIAGNOSIS — K5730 Diverticulosis of large intestine without perforation or abscess without bleeding: Secondary | ICD-10-CM | POA: Diagnosis not present

## 2016-12-25 DIAGNOSIS — Z8719 Personal history of other diseases of the digestive system: Secondary | ICD-10-CM

## 2016-12-25 DIAGNOSIS — Z960 Presence of urogenital implants: Secondary | ICD-10-CM

## 2016-12-25 DIAGNOSIS — E861 Hypovolemia: Secondary | ICD-10-CM | POA: Diagnosis present

## 2016-12-25 DIAGNOSIS — E860 Dehydration: Secondary | ICD-10-CM | POA: Diagnosis present

## 2016-12-25 DIAGNOSIS — Z79899 Other long term (current) drug therapy: Secondary | ICD-10-CM

## 2016-12-25 DIAGNOSIS — M25461 Effusion, right knee: Secondary | ICD-10-CM | POA: Diagnosis not present

## 2016-12-25 DIAGNOSIS — I2510 Atherosclerotic heart disease of native coronary artery without angina pectoris: Secondary | ICD-10-CM | POA: Diagnosis present

## 2016-12-25 DIAGNOSIS — R1084 Generalized abdominal pain: Secondary | ICD-10-CM | POA: Diagnosis not present

## 2016-12-25 DIAGNOSIS — R6520 Severe sepsis without septic shock: Secondary | ICD-10-CM | POA: Diagnosis present

## 2016-12-25 DIAGNOSIS — Z9889 Other specified postprocedural states: Secondary | ICD-10-CM

## 2016-12-25 DIAGNOSIS — N179 Acute kidney failure, unspecified: Secondary | ICD-10-CM

## 2016-12-25 DIAGNOSIS — K219 Gastro-esophageal reflux disease without esophagitis: Secondary | ICD-10-CM | POA: Diagnosis present

## 2016-12-25 DIAGNOSIS — Z882 Allergy status to sulfonamides status: Secondary | ICD-10-CM

## 2016-12-25 DIAGNOSIS — E876 Hypokalemia: Secondary | ICD-10-CM | POA: Diagnosis present

## 2016-12-25 DIAGNOSIS — I10 Essential (primary) hypertension: Secondary | ICD-10-CM

## 2016-12-25 DIAGNOSIS — R6889 Other general symptoms and signs: Secondary | ICD-10-CM | POA: Diagnosis present

## 2016-12-25 DIAGNOSIS — A419 Sepsis, unspecified organism: Principal | ICD-10-CM

## 2016-12-25 DIAGNOSIS — N183 Chronic kidney disease, stage 3 unspecified: Secondary | ICD-10-CM

## 2016-12-25 DIAGNOSIS — I129 Hypertensive chronic kidney disease with stage 1 through stage 4 chronic kidney disease, or unspecified chronic kidney disease: Secondary | ICD-10-CM | POA: Diagnosis present

## 2016-12-25 DIAGNOSIS — E039 Hypothyroidism, unspecified: Secondary | ICD-10-CM

## 2016-12-25 DIAGNOSIS — K432 Incisional hernia without obstruction or gangrene: Secondary | ICD-10-CM | POA: Diagnosis present

## 2016-12-25 DIAGNOSIS — E871 Hypo-osmolality and hyponatremia: Secondary | ICD-10-CM | POA: Diagnosis not present

## 2016-12-25 DIAGNOSIS — R6510 Systemic inflammatory response syndrome (SIRS) of non-infectious origin without acute organ dysfunction: Secondary | ICD-10-CM

## 2016-12-25 DIAGNOSIS — R339 Retention of urine, unspecified: Secondary | ICD-10-CM | POA: Diagnosis not present

## 2016-12-25 DIAGNOSIS — M25462 Effusion, left knee: Secondary | ICD-10-CM | POA: Diagnosis not present

## 2016-12-25 DIAGNOSIS — J90 Pleural effusion, not elsewhere classified: Secondary | ICD-10-CM | POA: Diagnosis present

## 2016-12-25 LAB — CBC WITH DIFFERENTIAL/PLATELET
Basophils Absolute: 0 10*3/uL (ref 0.0–0.1)
Basophils Relative: 0 %
EOS PCT: 0 %
Eosinophils Absolute: 0 10*3/uL (ref 0.0–0.7)
HEMATOCRIT: 34.7 % — AB (ref 39.0–52.0)
HEMOGLOBIN: 12.1 g/dL — AB (ref 13.0–17.0)
LYMPHS ABS: 0.5 10*3/uL — AB (ref 0.7–4.0)
LYMPHS PCT: 2 %
MCH: 30.3 pg (ref 26.0–34.0)
MCHC: 34.9 g/dL (ref 30.0–36.0)
MCV: 86.8 fL (ref 78.0–100.0)
Monocytes Absolute: 0.6 10*3/uL (ref 0.1–1.0)
Monocytes Relative: 3 %
NEUTROS ABS: 18.5 10*3/uL — AB (ref 1.7–7.7)
Neutrophils Relative %: 95 %
PLATELETS: 122 10*3/uL — AB (ref 150–400)
RBC: 4 MIL/uL — AB (ref 4.22–5.81)
RDW: 14.5 % (ref 11.5–15.5)
WBC: 19.6 10*3/uL — AB (ref 4.0–10.5)

## 2016-12-25 LAB — URINALYSIS, ROUTINE W REFLEX MICROSCOPIC
Bilirubin Urine: NEGATIVE
Glucose, UA: NEGATIVE mg/dL
KETONES UR: NEGATIVE mg/dL
Nitrite: NEGATIVE
PROTEIN: 30 mg/dL — AB
Specific Gravity, Urine: 1.024 (ref 1.005–1.030)
pH: 5 (ref 5.0–8.0)

## 2016-12-25 LAB — INFLUENZA PANEL BY PCR (TYPE A & B)
INFLAPCR: NEGATIVE
INFLBPCR: NEGATIVE

## 2016-12-25 LAB — BASIC METABOLIC PANEL
Anion gap: 9 (ref 5–15)
BUN: 28 mg/dL — AB (ref 6–20)
CALCIUM: 7.5 mg/dL — AB (ref 8.9–10.3)
CO2: 21 mmol/L — ABNORMAL LOW (ref 22–32)
CREATININE: 1.77 mg/dL — AB (ref 0.61–1.24)
Chloride: 96 mmol/L — ABNORMAL LOW (ref 101–111)
GFR calc Af Amer: 43 mL/min — ABNORMAL LOW (ref >60)
GFR, EST NON AFRICAN AMERICAN: 37 mL/min — AB (ref >60)
Glucose, Bld: 121 mg/dL — ABNORMAL HIGH (ref 65–99)
POTASSIUM: 3.8 mmol/L (ref 3.5–5.1)
SODIUM: 126 mmol/L — AB (ref 135–145)

## 2016-12-25 LAB — COMPREHENSIVE METABOLIC PANEL
ALT: 13 U/L — ABNORMAL LOW (ref 17–63)
ANION GAP: 16 — AB (ref 5–15)
AST: 30 U/L (ref 15–41)
Albumin: 3.3 g/dL — ABNORMAL LOW (ref 3.5–5.0)
Alkaline Phosphatase: 47 U/L (ref 38–126)
BUN: 31 mg/dL — AB (ref 6–20)
CHLORIDE: 89 mmol/L — AB (ref 101–111)
CO2: 18 mmol/L — ABNORMAL LOW (ref 22–32)
Calcium: 8.3 mg/dL — ABNORMAL LOW (ref 8.9–10.3)
Creatinine, Ser: 2 mg/dL — ABNORMAL HIGH (ref 0.61–1.24)
GFR calc Af Amer: 37 mL/min — ABNORMAL LOW (ref >60)
GFR, EST NON AFRICAN AMERICAN: 32 mL/min — AB (ref >60)
Glucose, Bld: 141 mg/dL — ABNORMAL HIGH (ref 65–99)
POTASSIUM: 3.6 mmol/L (ref 3.5–5.1)
Sodium: 123 mmol/L — ABNORMAL LOW (ref 135–145)
Total Bilirubin: 2.4 mg/dL — ABNORMAL HIGH (ref 0.3–1.2)
Total Protein: 5.9 g/dL — ABNORMAL LOW (ref 6.5–8.1)

## 2016-12-25 LAB — CBC
HCT: 31.8 % — ABNORMAL LOW (ref 39.0–52.0)
Hemoglobin: 11.2 g/dL — ABNORMAL LOW (ref 13.0–17.0)
MCH: 30.6 pg (ref 26.0–34.0)
MCHC: 35.2 g/dL (ref 30.0–36.0)
MCV: 86.9 fL (ref 78.0–100.0)
PLATELETS: DECREASED 10*3/uL (ref 150–400)
RBC: 3.66 MIL/uL — AB (ref 4.22–5.81)
RDW: 15 % (ref 11.5–15.5)
WBC: 12.3 10*3/uL — ABNORMAL HIGH (ref 4.0–10.5)

## 2016-12-25 LAB — I-STAT CG4 LACTIC ACID, ED
LACTIC ACID, VENOUS: 4.68 mmol/L — AB (ref 0.5–1.9)
LACTIC ACID, VENOUS: 2.85 mmol/L — AB (ref 0.5–1.9)

## 2016-12-25 LAB — OSMOLALITY: Osmolality: 266 mOsm/kg — ABNORMAL LOW (ref 275–295)

## 2016-12-25 LAB — OSMOLALITY, URINE: OSMOLALITY UR: 163 mosm/kg — AB (ref 300–900)

## 2016-12-25 LAB — TROPONIN I

## 2016-12-25 LAB — SODIUM, URINE, RANDOM: Sodium, Ur: <10 mmol/L

## 2016-12-25 MED ORDER — OSELTAMIVIR PHOSPHATE 30 MG PO CAPS
30.0000 mg | ORAL_CAPSULE | Freq: Two times a day (BID) | ORAL | Status: DC
  Administered 2016-12-25 – 2016-12-27 (×4): 30 mg via ORAL
  Filled 2016-12-25 (×5): qty 1

## 2016-12-25 MED ORDER — MORPHINE SULFATE (PF) 4 MG/ML IV SOLN
4.0000 mg | Freq: Once | INTRAVENOUS | Status: AC
  Administered 2016-12-25: 4 mg via INTRAVENOUS
  Filled 2016-12-25: qty 1

## 2016-12-25 MED ORDER — SODIUM CHLORIDE 0.9 % IV SOLN
INTRAVENOUS | Status: DC
  Administered 2016-12-25: 18:00:00 via INTRAVENOUS
  Administered 2016-12-26: 1000 mL via INTRAVENOUS
  Administered 2016-12-26 – 2016-12-27 (×2): via INTRAVENOUS

## 2016-12-25 MED ORDER — OXYCODONE-ACETAMINOPHEN 5-325 MG PO TABS
1.0000 | ORAL_TABLET | ORAL | Status: DC | PRN
  Administered 2016-12-25: 1 via ORAL
  Filled 2016-12-25: qty 2
  Filled 2016-12-25: qty 1

## 2016-12-25 MED ORDER — PANTOPRAZOLE SODIUM 20 MG PO TBEC
20.0000 mg | DELAYED_RELEASE_TABLET | Freq: Every day | ORAL | Status: DC
  Administered 2016-12-25 – 2016-12-26 (×2): 20 mg via ORAL
  Filled 2016-12-25 (×2): qty 1

## 2016-12-25 MED ORDER — HYDROMORPHONE HCL 2 MG/ML IJ SOLN: 0.5000 mg | INTRAMUSCULAR | Status: DC | PRN

## 2016-12-25 MED ORDER — HEPARIN SODIUM (PORCINE) 5000 UNIT/ML IJ SOLN
5000.0000 [IU] | Freq: Three times a day (TID) | INTRAMUSCULAR | Status: DC
  Administered 2016-12-25 – 2016-12-27 (×6): 5000 [IU] via SUBCUTANEOUS
  Filled 2016-12-25 (×6): qty 1

## 2016-12-25 MED ORDER — SODIUM CHLORIDE 0.9 % IV BOLUS (SEPSIS)
1000.0000 mL | Freq: Once | INTRAVENOUS | Status: AC
  Administered 2016-12-25: 1000 mL via INTRAVENOUS

## 2016-12-25 MED ORDER — HYDROMORPHONE HCL 2 MG/ML IJ SOLN: 1.0000 mg | Freq: Once | INTRAMUSCULAR | Status: DC

## 2016-12-25 MED ORDER — ACETAMINOPHEN 325 MG PO TABS: 650.0000 mg | ORAL_TABLET | Freq: Four times a day (QID) | ORAL | Status: DC | PRN

## 2016-12-25 MED ORDER — ATENOLOL 50 MG PO TABS
50.0000 mg | ORAL_TABLET | Freq: Every day | ORAL | Status: DC
  Filled 2016-12-25 (×2): qty 1

## 2016-12-25 MED ORDER — PHENOL 1.4 % MT LIQD: 1.0000 | OROMUCOSAL | Status: DC | PRN

## 2016-12-25 MED ORDER — LEVOTHYROXINE SODIUM 25 MCG PO TABS
125.0000 ug | ORAL_TABLET | Freq: Every day | ORAL | Status: DC
  Administered 2016-12-26 – 2016-12-27 (×2): 125 ug via ORAL
  Filled 2016-12-25 (×2): qty 1

## 2016-12-25 MED ORDER — ONDANSETRON HCL 4 MG/2ML IJ SOLN
4.0000 mg | Freq: Once | INTRAMUSCULAR | Status: AC
  Administered 2016-12-25: 4 mg via INTRAVENOUS
  Filled 2016-12-25: qty 2

## 2016-12-25 MED ORDER — PAROXETINE HCL ER 25 MG PO TB24
25.0000 mg | ORAL_TABLET | Freq: Every day | ORAL | Status: DC
  Administered 2016-12-26 – 2016-12-27 (×2): 25 mg via ORAL
  Filled 2016-12-25 (×2): qty 1

## 2016-12-25 MED ORDER — ATORVASTATIN CALCIUM 20 MG PO TABS
20.0000 mg | ORAL_TABLET | Freq: Every day | ORAL | Status: DC
  Administered 2016-12-25 – 2016-12-26 (×2): 20 mg via ORAL
  Filled 2016-12-25 (×2): qty 1

## 2016-12-25 NOTE — H&P (Signed)
History and Physical    Eric Suarez [id number] DOB: [birth year] DOA: 12/25/2016  PCP: Dwan Bolt, MD  Patient coming from: Home  Chief Complaint: Generalized muscle and joint pains, weakness, decreased oral intake  HPI: Eric Suarez is a 71 y.o. male with medical history significant of hyperlipidemia, hypertension, hypothyroidism, left nephrectomy for renal cell carcinoma who presents 2 days status post ventral hernia repair. He states that since he has been discharged after his procedure, he has had very little oral intake. He also admits to generalized muscle and joint pains, joint swelling, generalized weakness, decreased oral intake in food and liquids, productive cough yellow sputum, low-grade fever as well as one-time episode of vomiting. He denies any rigors, chest pain, shortness of breath, nausea or abdominal pain, has not had a bowel movement since Sunday.  ED Course: CT abdomen and pelvis were obtained which showed small fluid collection. Case was discussed with general surgery and they have evaluated patient in consult. He was given IV fluids, with improvement in acidosis. TRH called for admission.  Review of Systems: As per HPI otherwise 10 point review of systems negative.   Past Medical History:  Diagnosis Date  . Anxiety   . Arthritis   . Cancer [facility]) [id number]   thryoid, left kidney  . Cataracts, bilateral   . Complication of anesthesia    unable to void after surgery  . Coronary artery disease    pt denies- had stress>10 yrs ago due to"get winded walking uphill'- neg  . GERD (gastroesophageal reflux disease)   . History of hiatal hernia   . History of kidney surgery    left kidney removed due to cancer 2017  . Hypertension   . Hypothyroidism   . Left renal mass   . Macular degeneration of both eyes    dry  . PONV (postoperative nausea and vomiting)     Past Surgical History:  Procedure Laterality Date  . APPENDECTOMY    . CYSTOSCOPY WITH  RETROGRADE PYELOGRAM, URETEROSCOPY AND STENT PLACEMENT Left 04/01/2016   Procedure: CYSTOSCOPY WITH RETROGRADE PYELOGRAM, left ureter;  Surgeon: Raynelle Bring, MD;  Location: [facility];  Service: Urology;  Laterality: Left;  . HEMORRHOID SURGERY    . HERNIA REPAIR     inguinal-3 on left , 1 on right  . INCISIONAL HERNIA REPAIR N/A 12/23/2016   Procedure: LAPAROSCOPIC VERSUS OPEN LYSIS OF ADHESIONS AND INCISIONAL HERNIA REPAIR WITH MESH;  Surgeon: Ralene Ok, MD;  Location: [place];  Service: General;  Laterality: N/A;  . INSERTION OF MESH N/A 12/23/2016   Procedure: INSERTION OF MESH;  Surgeon: Ralene Ok, MD;  Location: [place];  Service: General;  Laterality: N/A;  . LAPAROSCOPIC NEPHRECTOMY Left 04/18/2016   Procedure: LAPAROSCOPIC RADICAL  LEFT NEPHRECTOMY;  Surgeon: Raynelle Bring, MD;  Location: [facility];  Service: Urology;  Laterality: Left;  . NASAL SINUS SURGERY     left side  . THYROIDECTOMY, PARTIAL     left small amount on left side-for cancer  . TONSILLECTOMY       reports that he has never smoked. He has never used smokeless tobacco. He reports that he does not drink alcohol or use drugs.  Allergies  Allergen Reactions  . Tamsulosin Nausea And Vomiting and Other (See Comments)    Uncomfortable in pelvic region  . Sulfa Antibiotics Rash    History reviewed. No pertinent family history.   Prior to Admission medications   Medication Sig Start Date End Date Taking? Authorizing Provider  atenolol-chlorthalidone (TENORETIC) 50-25 MG tablet Take 1 tablet by mouth daily.  02/21/16   Historical Provider, MD  atorvastatin (LIPITOR) 20 MG tablet Take 20 mg by mouth at bedtime.    Historical Provider, MD  Coenzyme Q10 (COQ10) 100 MG CAPS Take 1 capsule by mouth daily.    Historical Provider, MD  KLOR-CON M20 20 MEQ tablet Take 20 mEq by mouth daily. 02/27/16   Historical Provider, MD  lansoprazole (PREVACID) 15 MG capsule Take 15 mg by mouth at bedtime.    Historical Provider, MD    Multiple Vitamins-Minerals (PRESERVISION AREDS PO) Take 1 capsule by mouth daily.    Historical Provider, MD  Omega-3 Fatty Acids (FISH OIL) 1200 MG CAPS Take 2 capsules by mouth 2 (two) times daily.    Historical Provider, MD  OVER THE COUNTER MEDICATION Take 1 tablet by mouth daily. Cognium - Brain Vitamin    Historical Provider, MD  oxyCODONE-acetaminophen (ROXICET) 5-325 MG tablet Take 1-2 tablets by mouth every 4 (four) hours as needed. 12/23/16   Ralene Ok, MD  PARoxetine (PAXIL-CR) 25 MG 24 hr tablet Take 25 mg by mouth daily. 02/21/16   Historical Provider, MD  SYNTHROID 125 MCG tablet Take 125 mcg by mouth daily.  01/10/16   Historical Provider, MD    Physical Exam: Vitals:   12/25/16 1515 12/25/16 1545 12/25/16 1615 12/25/16 1645  BP: 114/68 113/64 109/62 111/67  Pulse: 76 75 76 79  Resp: 21 18 25 17   Temp:      TempSrc:      SpO2: 93% 95% 90% 94%  Weight:      Height:        Constitutional: NAD, calm, comfortable Eyes: PERRL, lids and conjunctivae normal ENMT: Mucous membranes are dry, white film over tongue due to Maalox  Neck: normal, supple, no masses, no thyromegaly Respiratory: clear to auscultation bilaterally, no wheezing, no crackles. Normal respiratory effort. No accessory muscle use.  Cardiovascular: Regular rate and rhythm, no murmurs / rubs / gallops. No extremity edema Abdomen: no tenderness, no masses palpated. No hepatosplenomegaly. No distension.  Musculoskeletal: no clubbing / cyanosis. No joint deformity upper and lower extremities. Good ROM, no contractures. Normal muscle tone. +some joint effusion/edema symmetrically  Skin: no rashes, lesions, ulcers. No induration Neurologic: CN 2-12 grossly intact. Strength 5/5 in all 4.  Psychiatric: Normal judgment and insight. Alert and oriented x 3. Normal mood.   Labs on Admission: I have personally reviewed following labs and imaging studies  CBC:  Recent Labs Lab 12/25/16 1129  WBC 19.6*  NEUTROABS  18.5*  HGB 12.1*  HCT 34.7*  MCV 86.8  PLT 123XX123*   Basic Metabolic Panel:  Recent Labs Lab 12/25/16 1129  NA 123*  K 3.6  CL 89*  CO2 18*  GLUCOSE 141*  BUN 31*  CREATININE 2.00*  CALCIUM 8.3*   GFR: Estimated Creatinine Clearance: 40.5 mL/min (by C-G formula based on SCr of 2 mg/dL (H)). Liver Function Tests:  Recent Labs Lab 12/25/16 1129  AST 30  ALT 13*  ALKPHOS 47  BILITOT 2.4*  PROT 5.9*  ALBUMIN 3.3*   No results for input(s): LIPASE, AMYLASE in the last 168 hours. No results for input(s): AMMONIA in the last 168 hours. Coagulation Profile: No results for input(s): INR, PROTIME in the last 168 hours. Cardiac Enzymes:  Recent Labs Lab 12/25/16 1339  TROPONINI <0.03   BNP (last 3 results) No results for input(s): PROBNP in the last 8760 hours. HbA1C: No results for  input(s): HGBA1C in the last 72 hours. CBG: No results for input(s): GLUCAP in the last 168 hours. Lipid Profile: No results for input(s): CHOL, HDL, LDLCALC, TRIG, CHOLHDL, LDLDIRECT in the last 72 hours. Thyroid Function Tests: No results for input(s): TSH, T4TOTAL, FREET4, T3FREE, THYROIDAB in the last 72 hours. Anemia Panel: No results for input(s): VITAMINB12, FOLATE, FERRITIN, TIBC, IRON, RETICCTPCT in the last 72 hours. Urine analysis:    Component Value Date/Time   COLORURINE YELLOW 12/25/2016 [place] 12/25/2016 1232   LABSPEC 1.024 12/25/2016 1232   PHURINE 5.0 12/25/2016 1232   GLUCOSEU NEGATIVE 12/25/2016 1232   HGBUR MODERATE (A) 12/25/2016 1232   BILIRUBINUR NEGATIVE 12/25/2016 1232   KETONESUR NEGATIVE 12/25/2016 1232   PROTEINUR 30 (A) 12/25/2016 1232   NITRITE NEGATIVE 12/25/2016 1232   LEUKOCYTESUR TRACE (A) 12/25/2016 1232   Sepsis Labs: !!!!!!!!!!!!!!!!!!!!!!!!!!!!!!!!!!!!!!!!!!!! @LABRCNTIP (procalcitonin:4,lacticidven:4) )No results found for this or any previous visit (from the past 240 hour(s)).   Radiological Exams on Admission: Ct  Abdomen Pelvis Wo Contrast  Result Date: 12/25/2016 CLINICAL DATA:  Generalized body aches.  Abdominal pain. EXAM: CT ABDOMEN AND PELVIS WITHOUT CONTRAST TECHNIQUE: Multidetector CT imaging of the abdomen and pelvis was performed following the standard protocol without IV contrast. COMPARISON:  11/29/2016 FINDINGS: Lower chest: Dependent opacities in the lung bases, likely atelectasis. Heart is normal size. Coronary artery calcifications in the left main, left anterior and left circumflex coronary arteries. Trace bilateral effusions. Hepatobiliary: No focal hepatic abnormality. Gallbladder unremarkable. Pancreas: No focal abnormality or ductal dilatation. Spleen: Mild splenomegaly with a craniocaudal length of the spleen measuring 14.4 cm. Findings are stable. Adrenals/Urinary Tract: Left kidney is absent. Multiple calcifications noted along the posterior bladder wall. The bladder wall appears thickened. Foley catheter is in place within the bladder which is decompressed. Punctate calcification near the right UVJ could be knee right ureteral stone. No hydronephrosis on the right. No renal stones. Adrenal glands are unremarkable. Stomach/Bowel: Scattered left colonic diverticulosis. No active diverticulitis. No evidence of bowel obstruction. Vascular/Lymphatic: Aortic and iliac calcifications. No aneurysm or adenopathy. Reproductive: Prostate enlargement and calcifications. Other: Trace free fluid in the pelvis. Fluid is noted in the midline of the anterior abdominal wall, in the area of prior hernia, likely postoperative fluid collection. This measures 3.8 x 3.6 cm. Stranding noted within the subcutaneous soft tissues of the anterior abdominal wall, likely postoperative. A few locules of gas in the subcutaneous soft tissues, likely postoperative. Musculoskeletal: No acute bony abnormality. IMPRESSION: Postoperative changes from ventral hernia repair. Fluid collection at the site of prior ventral hernia measures  3.8 x 3.6 cm. This could represent postoperative seroma, hematoma or abscess. Trace bilateral pleural effusions. Bibasilar dependent opacities, likely atelectasis. Stable splenomegaly. Prostate enlargement with calcifications along the posterior bladder wall, most of which are likely in the collapsed lumen although at least 1 right posterior calcification on image 85 could be within the wall of the bladder. Also, an adjacent calcification is near the right ureterovesical junction. No hydronephrosis. Scattered colonic diverticulosis.  No active diverticulitis. Electronically Signed   By: Rolm Baptise M.D.   On: 12/25/2016 14:31   Dg Chest 2 View  Result Date: 12/25/2016 CLINICAL DATA:  Short of breath.  Fall today. EXAM: CHEST  2 VIEW COMPARISON:  10/24/2016 FINDINGS: Heart size upper normal. Mild vascular congestion with interstitial edema. Small pleural effusions bilaterally. Mild bibasilar atelectasis with hypoventilation. No acute skeletal abnormality. IMPRESSION: Mild heart failure with interstitial edema and small pleural effusions. Mild  bibasilar atelectasis. Electronically Signed   By: Franchot Gallo M.D.   On: 12/25/2016 14:45   Dg Knee Complete 4 Views Left  Result Date: 12/25/2016 CLINICAL DATA:  Bilateral knee pain.  Fell. EXAM: LEFT KNEE - COMPLETE 4+ VIEW; RIGHT KNEE - COMPLETE 4+ VIEW COMPARISON:  None. FINDINGS: The patellofemoral joint spaces are narrowed bilaterally. The medial and lateral compartments are fairly well maintained. There is also mild scalloping of the distal femur on lateral films. These findings can be seen with CPPD arthropathy. No obvious chondrocalcinosis. No acute fracture or osteochondral lesion. IMPRESSION: 1. No acute fracture. 2. Patellofemoral joint degenerative changes, small joint effusions and scalloping of the distal femurs most typically seen with CPPD arthropathy. No definite chondrocalcinosis. Electronically Signed   By: Marijo Sanes M.D.   On: 12/25/2016  14:59   Dg Knee Complete 4 Views Right  Result Date: 12/25/2016 CLINICAL DATA:  Bilateral knee pain.  Fell. EXAM: LEFT KNEE - COMPLETE 4+ VIEW; RIGHT KNEE - COMPLETE 4+ VIEW COMPARISON:  None. FINDINGS: The patellofemoral joint spaces are narrowed bilaterally. The medial and lateral compartments are fairly well maintained. There is also mild scalloping of the distal femur on lateral films. These findings can be seen with CPPD arthropathy. No obvious chondrocalcinosis. No acute fracture or osteochondral lesion. IMPRESSION: 1. No acute fracture. 2. Patellofemoral joint degenerative changes, small joint effusions and scalloping of the distal femurs most typically seen with CPPD arthropathy. No definite chondrocalcinosis. Electronically Signed   By: Marijo Sanes M.D.   On: 12/25/2016 14:59    EKG: Independently reviewed. NSR, rate 76 without ST changes   Assessment/Plan Principal Problem:   Severe sepsis (HCC) Active Problems:   Flu-like symptoms   Dehydration with hyponatremia   AKI (acute kidney injury) ([place])   CKD (chronic kidney disease) stage 3, GFR 30-59 ml/min   S/P repair of ventral hernia   Hypothyroidism   HTN (hypertension)   Severe sepsis secondary to flu-like illness -Symptoms of generalize body aches, joint pains, weakness, decreased appetite, cough with yellow sputum, with lactic acidosis of 4.68 and WBC 19.6 on admission  -CXR negative for pneumonia  -Influenza screen was negative, however this could represent false negative. Will treat with tamiflu regardless  -Blood culture pending  Hypovolemic hyponatremia -Check urine osmol, urine Na, serum osmol  -Hold chlorthalidone  -IVF  -Trend BMP   AKI on CKD stage 3, s/p left nephrectomy due to RCC  -Baseline Cr 1.5, Cr is 2 on admission -UA unremarkable, WBC 6-30, rate bacteria, trace leuk, negative nitrite.   -Suspect this is due to dehydration as well as poor PO intake -Hold chlorthalidone  -IVF -Trend BMP    Post-op fluid collection after ventral hernia repair 12/23/16 -CCS consulted, likely a benign post-op finding  -Monitor for now   Urinary retention -Developed after surgery, patient was discharged home with foley in place -Will need to follow up with his urologist as outpatient   Hypothyroidism -Continue synthroid  HTN -Continue atenolol. Will hold chlorthalidone in setting of AKI and hyponatremia      DVT prophylaxis: subq hep Code Status: full  Family Communication: at bedside  Disposition Plan: pending further stabilization, suspect return home  Consults called: CCS by EDP   Admission status: observation  It is my clinical opinion that referral for OBSERVATION is reasonable and necessary in this 71 y.o. year old male  presenting with symptoms of flu-like illness, concerning for viral illness and dehydration  in the context of PMH including  CKD stage 3, hypothyroidism, HTN   with pertinent physical exam findings of dry mucus membrane  and pertinent radiographic and laboratory data including leukocytosis, lactic acidosis.  The aforementioned taken together are felt to place the patient at high risk for further  clinical deterioration. However it is anticipated that the patient may be medically stable for discharge from the hospital within 24 to 48 hours.    Dessa Phi, DO [facility] [email or web address] Password [email or web address] 12/25/2016, 5:10 PM

## 2016-12-25 NOTE — Consult Note (Signed)
[facility] Consult/Admission Note  Eric Suarez 1946-08-14  [id number].    Requesting MD: Dr. Gilford Raid Chief Complaint/Reason for Consult: Abdominal pain, vomiting, postop  HPI:   Pt is a 71 year old male with a history of renal cancer, CAD, GERD, HTN, Hypothyroidism who presented to the [facility] ED with complaints of bilateral knee, hand, and shoulder pain and associated weakness. Patient had laparoscopic incisional hernia repair 2 days ago by Dr. Rosendo Gros. Patient states minimal abdominal pain after surgery. He states he does not have much of an appetite. Patient had some mild abdominal pain last night with 4-5 episodes of emesis. No blood in his vomit. The abdominal pain and emesis has resolved. Patient states he only has abdominal pain with movement. Patient states he has not passed gas since surgery nor has had a bowel movement. Patient is mainly complaining about the constant, severe, nonradiating bilateral knee pain with associated generalized weakness and swelling of his knees and hands. Wife at bedside. She states patient fell on his knees coming from the bathroom last night. Patient denies fever, chills, chest pain, shortness breath. Patient has a Foley catheter as he was unable to urinate after surgery.  ED course: Labs: Sodium 123, chloride 89, BUN 31, creatinine 2.0, anion gap 16, total bilirubin 2.4, lactic acid 4.68, WBC 19.6 left shift with neutrophils 18.5, hemoglobin 12.1 CT scan: Fluid collection at the site of prior ventral hernia measures 3.83.6 cm. Trace bilateral pleural effusions. Bibasilar dependent opacities, likely atelectasis. Scattered colonic diverticulosis no active diverticulitis.  ROS:  Review of Systems  Constitutional: Negative for chills, diaphoresis and fever.  HENT: Negative for sore throat.   Eyes: Negative for discharge.  Respiratory: Negative for shortness of breath.   Cardiovascular: Positive for leg swelling. Negative for chest pain.   Gastrointestinal: Positive for abdominal pain, constipation, nausea and vomiting. Negative for diarrhea.  Musculoskeletal: Positive for falls, joint pain and myalgias.  Skin: Negative for rash.  Neurological: Positive for weakness. Negative for focal weakness, loss of consciousness and headaches.  All other systems reviewed and are negative.    History reviewed. No pertinent family history.  Past Medical History:  Diagnosis Date  . Anxiety   . Arthritis   . Cancer [facility]) [id number]   thryoid, left kidney  . Cataracts, bilateral   . Complication of anesthesia    unable to void after surgery  . Coronary artery disease    pt denies- had stress>10 yrs ago due to"get winded walking uphill'- neg  . GERD (gastroesophageal reflux disease)   . History of hiatal hernia   . History of kidney surgery    left kidney removed due to cancer 2017  . Hypertension   . Hypothyroidism   . Left renal mass   . Macular degeneration of both eyes    dry  . PONV (postoperative nausea and vomiting)     Past Surgical History:  Procedure Laterality Date  . APPENDECTOMY    . CYSTOSCOPY WITH RETROGRADE PYELOGRAM, URETEROSCOPY AND STENT PLACEMENT Left 04/01/2016   Procedure: CYSTOSCOPY WITH RETROGRADE PYELOGRAM, left ureter;  Surgeon: Raynelle Bring, MD;  Location: [facility];  Service: Urology;  Laterality: Left;  . HEMORRHOID SURGERY    . HERNIA REPAIR     inguinal-3 on left , 1 on right  . INCISIONAL HERNIA REPAIR N/A 12/23/2016   Procedure: LAPAROSCOPIC VERSUS OPEN LYSIS OF ADHESIONS AND INCISIONAL HERNIA REPAIR WITH MESH;  Surgeon: Ralene Ok, MD;  Location: [facility];  Service: General;  Laterality:  N/A;  . INSERTION OF MESH N/A 12/23/2016   Procedure: INSERTION OF MESH;  Surgeon: Ralene Ok, MD;  Location: [place];  Service: General;  Laterality: N/A;  . LAPAROSCOPIC NEPHRECTOMY Left 04/18/2016   Procedure: LAPAROSCOPIC RADICAL  LEFT NEPHRECTOMY;  Surgeon: Raynelle Bring, MD;  Location: [facility];   Service: Urology;  Laterality: Left;  . NASAL SINUS SURGERY     left side  . THYROIDECTOMY, PARTIAL     left small amount on left side-for cancer  . TONSILLECTOMY      Social History:  reports that he has never smoked. He has never used smokeless tobacco. He reports that he does not drink alcohol or use drugs.  Allergies:  Allergies  Allergen Reactions  . Sulfa Antibiotics Rash     (Not in a hospital admission)  Blood pressure 107/66, pulse 74, temperature 98.7 F (37.1 C), temperature source Oral, resp. rate 23, height _0  (1.905 m), weight 205 lb (93 kg), SpO2 97 %.  Physical Exam  Constitutional: He is oriented to person, place, and time. He appears not jaundiced.  Elderly white male lying in bed unable to remain still due to knee pain, in no acute distress  HENT:  Head: Normocephalic and atraumatic.  Mouth/Throat: Uvula is midline. Mucous membranes are dry.  White matter noted to patient's tongue and around teeth (took Maalox today) mucous membranes dry  Eyes: Conjunctivae are normal. Pupils are equal, round, and reactive to light. Right eye exhibits no discharge. Left eye exhibits no discharge. No scleral icterus.  Neck: Normal range of motion. Neck supple.  Cardiovascular: Normal rate, regular rhythm, S1 normal, S2 normal and normal heart sounds.  Exam reveals no gallop and no friction rub.   No murmur heard. Pulses:      Radial pulses are 2+ on the right side, and 2+ on the left side.       Dorsalis pedis pulses are 2+ on the right side, and 2+ on the left side.  Pulmonary/Chest: Effort normal and breath sounds normal. Tachypnea noted. No respiratory distress. He has no wheezes. He has no rales.  Abdominal: Soft. He exhibits no distension. Bowel sounds are hypoactive. There is no tenderness. There is no rigidity, no rebound and no guarding.  Incisions appear well healing, no surrounding erythema or edema to suggest infection. Patient nontender on exam, nondistended.   Musculoskeletal:  Bilateral knee effusions, trace pitting edema noted to BLE.  Neurological: He is oriented to person, place, and time.  Skin: Skin is warm and dry. No rash noted. He is not diaphoretic.  Psychiatric: Mood and affect normal.  Nursing note and vitals reviewed.   Results for orders placed or performed during the hospital encounter of 12/25/16 (from the past 48 hour(s))  Comprehensive metabolic panel     Status: Abnormal   Collection Time: 12/25/16 11:29 AM  Result Value Ref Range   Sodium 123 (L) 135 - 145 mmol/L   Potassium 3.6 3.5 - 5.1 mmol/L   Chloride 89 (L) 101 - 111 mmol/L   CO2 18 (L) 22 - 32 mmol/L   Glucose, Bld 141 (H) 65 - 99 mg/dL   BUN 31 (H) 6 - 20 mg/dL   Creatinine, Ser 2.00 (H) 0.61 - 1.24 mg/dL   Calcium 8.3 (L) 8.9 - 10.3 mg/dL   Total Protein 5.9 (L) 6.5 - 8.1 g/dL   Albumin 3.3 (L) 3.5 - 5.0 g/dL   AST 30 15 - 41 U/L   ALT 13 (L) 17 -  63 U/L   Alkaline Phosphatase 47 38 - 126 U/L   Total Bilirubin 2.4 (H) 0.3 - 1.2 mg/dL   GFR calc non Af Amer 32 (L) >60 mL/min   GFR calc Af Amer 37 (L) >60 mL/min    Comment: (NOTE) The eGFR has been calculated using the CKD EPI equation. This calculation has not been validated in all clinical situations. eGFR's persistently <60 mL/min signify possible Chronic Kidney Disease.    Anion gap 16 (H) 5 - 15  CBC with Differential     Status: Abnormal   Collection Time: 12/25/16 11:29 AM  Result Value Ref Range   WBC 19.6 (H) 4.0 - 10.5 K/uL   RBC 4.00 (L) 4.22 - 5.81 MIL/uL   Hemoglobin 12.1 (L) 13.0 - 17.0 g/dL   HCT 34.7 (L) 39.0 - 52.0 %   MCV 86.8 78.0 - 100.0 fL   MCH 30.3 26.0 - 34.0 pg   MCHC 34.9 30.0 - 36.0 g/dL   RDW 14.5 11.5 - 15.5 %   Platelets 122 (L) 150 - 400 K/uL   Neutrophils Relative % 95 %   Neutro Abs 18.5 (H) 1.7 - 7.7 K/uL   Lymphocytes Relative 2 %   Lymphs Abs 0.5 (L) 0.7 - 4.0 K/uL   Monocytes Relative 3 %   Monocytes Absolute 0.6 0.1 - 1.0 K/uL   Eosinophils Relative 0 %    Eosinophils Absolute 0.0 0.0 - 0.7 K/uL   Basophils Relative 0 %   Basophils Absolute 0.0 0.0 - 0.1 K/uL  I-Stat CG4 Lactic Acid, ED     Status: Abnormal   Collection Time: 12/25/16 11:41 AM  Result Value Ref Range   Lactic Acid, Venous 4.68 (HH) 0.5 - 1.9 mmol/L   Comment NOTIFIED PHYSICIAN   Influenza panel by PCR (type A & B)     Status: None   Collection Time: 12/25/16 12:26 PM  Result Value Ref Range   Influenza A By PCR NEGATIVE NEGATIVE   Influenza B By PCR NEGATIVE NEGATIVE    Comment: (NOTE) The Xpert Xpress Flu assay is intended as an aid in the diagnosis of  influenza and should not be used as a sole basis for treatment.  This  assay is FDA approved for nasopharyngeal swab specimens only. Nasal  washings and aspirates are unacceptable for Xpert Xpress Flu testing.   Urinalysis, Routine w reflex microscopic     Status: Abnormal   Collection Time: 12/25/16 12:32 PM  Result Value Ref Range   Color, Urine YELLOW YELLOW   APPearance CLEAR CLEAR   Specific Gravity, Urine 1.024 1.005 - 1.030   pH 5.0 5.0 - 8.0   Glucose, UA NEGATIVE NEGATIVE mg/dL   Hgb urine dipstick MODERATE (A) NEGATIVE   Bilirubin Urine NEGATIVE NEGATIVE   Ketones, ur NEGATIVE NEGATIVE mg/dL   Protein, ur 30 (A) NEGATIVE mg/dL   Nitrite NEGATIVE NEGATIVE   Leukocytes, UA TRACE (A) NEGATIVE   RBC / HPF TOO NUMEROUS TO COUNT 0 - 5 RBC/hpf   WBC, UA 6-30 0 - 5 WBC/hpf   Bacteria, UA RARE (A) NONE SEEN   Squamous Epithelial / LPF 0-5 (A) NONE SEEN  Troponin I     Status: None   Collection Time: 12/25/16  1:39 PM  Result Value Ref Range   Troponin I <0.03 <0.03 ng/mL  I-Stat CG4 Lactic Acid, ED     Status: Abnormal   Collection Time: 12/25/16  2:16 PM  Result Value Ref Range  Lactic Acid, Venous 2.85 (HH) 0.5 - 1.9 mmol/L   Comment NOTIFIED PHYSICIAN    Ct Abdomen Pelvis Wo Contrast  Result Date: 12/25/2016 CLINICAL DATA:  Generalized body aches.  Abdominal pain. EXAM: CT ABDOMEN AND PELVIS  WITHOUT CONTRAST TECHNIQUE: Multidetector CT imaging of the abdomen and pelvis was performed following the standard protocol without IV contrast. COMPARISON:  11/29/2016 FINDINGS: Lower chest: Dependent opacities in the lung bases, likely atelectasis. Heart is normal size. Coronary artery calcifications in the left main, left anterior and left circumflex coronary arteries. Trace bilateral effusions. Hepatobiliary: No focal hepatic abnormality. Gallbladder unremarkable. Pancreas: No focal abnormality or ductal dilatation. Spleen: Mild splenomegaly with a craniocaudal length of the spleen measuring 14.4 cm. Findings are stable. Adrenals/Urinary Tract: Left kidney is absent. Multiple calcifications noted along the posterior bladder wall. The bladder wall appears thickened. Foley catheter is in place within the bladder which is decompressed. Punctate calcification near the right UVJ could be knee right ureteral stone. No hydronephrosis on the right. No renal stones. Adrenal glands are unremarkable. Stomach/Bowel: Scattered left colonic diverticulosis. No active diverticulitis. No evidence of bowel obstruction. Vascular/Lymphatic: Aortic and iliac calcifications. No aneurysm or adenopathy. Reproductive: Prostate enlargement and calcifications. Other: Trace free fluid in the pelvis. Fluid is noted in the midline of the anterior abdominal wall, in the area of prior hernia, likely postoperative fluid collection. This measures 3.8 x 3.6 cm. Stranding noted within the subcutaneous soft tissues of the anterior abdominal wall, likely postoperative. A few locules of gas in the subcutaneous soft tissues, likely postoperative. Musculoskeletal: No acute bony abnormality. IMPRESSION: Postoperative changes from ventral hernia repair. Fluid collection at the site of prior ventral hernia measures 3.8 x 3.6 cm. This could represent postoperative seroma, hematoma or abscess. Trace bilateral pleural effusions. Bibasilar dependent  opacities, likely atelectasis. Stable splenomegaly. Prostate enlargement with calcifications along the posterior bladder wall, most of which are likely in the collapsed lumen although at least 1 right posterior calcification on image 85 could be within the wall of the bladder. Also, an adjacent calcification is near the right ureterovesical junction. No hydronephrosis. Scattered colonic diverticulosis.  No active diverticulitis. Electronically Signed   By: Rolm Baptise M.D.   On: 12/25/2016 14:31   Dg Chest 2 View  Result Date: 12/25/2016 CLINICAL DATA:  Short of breath.  Fall today. EXAM: CHEST  2 VIEW COMPARISON:  10/24/2016 FINDINGS: Heart size upper normal. Mild vascular congestion with interstitial edema. Small pleural effusions bilaterally. Mild bibasilar atelectasis with hypoventilation. No acute skeletal abnormality. IMPRESSION: Mild heart failure with interstitial edema and small pleural effusions. Mild bibasilar atelectasis. Electronically Signed   By: Franchot Gallo M.D.   On: 12/25/2016 14:45   Dg Knee Complete 4 Views Left  Result Date: 12/25/2016 CLINICAL DATA:  Bilateral knee pain.  Fell. EXAM: LEFT KNEE - COMPLETE 4+ VIEW; RIGHT KNEE - COMPLETE 4+ VIEW COMPARISON:  None. FINDINGS: The patellofemoral joint spaces are narrowed bilaterally. The medial and lateral compartments are fairly well maintained. There is also mild scalloping of the distal femur on lateral films. These findings can be seen with CPPD arthropathy. No obvious chondrocalcinosis. No acute fracture or osteochondral lesion. IMPRESSION: 1. No acute fracture. 2. Patellofemoral joint degenerative changes, small joint effusions and scalloping of the distal femurs most typically seen with CPPD arthropathy. No definite chondrocalcinosis. Electronically Signed   By: Marijo Sanes M.D.   On: 12/25/2016 14:59   Dg Knee Complete 4 Views Right  Result Date: 12/25/2016 CLINICAL DATA:  Bilateral knee pain.  Fell. EXAM: LEFT KNEE -  COMPLETE 4+ VIEW; RIGHT KNEE - COMPLETE 4+ VIEW COMPARISON:  None. FINDINGS: The patellofemoral joint spaces are narrowed bilaterally. The medial and lateral compartments are fairly well maintained. There is also mild scalloping of the distal femur on lateral films. These findings can be seen with CPPD arthropathy. No obvious chondrocalcinosis. No acute fracture or osteochondral lesion. IMPRESSION: 1. No acute fracture. 2. Patellofemoral joint degenerative changes, small joint effusions and scalloping of the distal femurs most typically seen with CPPD arthropathy. No definite chondrocalcinosis. Electronically Signed   By: Marijo Sanes M.D.   On: 12/25/2016 14:59      Assessment/Plan  GERD Hypothyroidism CAD HTN Anxiety  Abdominal pain and emesis S/P laparoscopic hernia repair, 2/12, Dr. Rosendo Gros - Patient having mild abdominal pain and vomiting last night that has resolved. - Patient currently not complaining of abdominal pain or nausea or vomiting. - CT scan shows  fluid collection possible seroma versus hematoma versus abscess at the site of prior ventral hernia.  Appears to be normal postoperative changes Joint pain/myalgias - Symptoms likely 2/2 possible flu Urinary retention - patient on Flomax - Foley catheter was placed after patient had urinary retention postop, continue catheter Lactic acidosis Leukocytosis  Patient will be admitted to medicine for further evaluation of his hyponatremia an acute kidney injury.    We will continue to follow this patient during his admission. Thank you for the consult.  Kalman Drape, [facility] Surgery 12/25/2016, 3:54 PM Pager: [phone] Consults: [phone] Mon-Fri 7:00 am-4:30 pm Sat-Sun 7:00 am-11:30 am

## 2016-12-25 NOTE — ED Triage Notes (Signed)
Pt sent by doctor with weakness and generalized body aches. Pt had hernia repair on Monday and has had weakness since. Pt is alert and oriented but pale in color. Pt also has foley cath in place.

## 2016-12-25 NOTE — ED Provider Notes (Signed)
[place] DEPT Provider Note   CSN: [id number] Arrival date & time: 12/25/16  1115     History   Chief Complaint Chief Complaint  Patient presents with  . Post-op Problem    HPI Eric Suarez is a 71 y.o. male.  Pt presents to the ED not feeling well.  Pt had an incisional hernia repair (resulting from a left nephrectomy for renal cell carcinoma) by Dr. Rosendo Gros on 2/12.  Pt was d/c home with a foley as he was unable to urinate after the surgery.  Since the surgery, he has not been feeling well.  No bm since before the surgery.  He has been taking stool softeners and hydrocodone.  Pain has been controlled.  No fevers.  He initially presented to his pcp who sent him here.  Pt did fall last night and c/o bilateral knee pain.      Past Medical History:  Diagnosis Date  . Anxiety   . Arthritis   . Cancer [facility]) [id number]   thryoid, left kidney  . Cataracts, bilateral   . Complication of anesthesia    unable to void after surgery  . Coronary artery disease    pt denies- had stress>10 yrs ago due to"get winded walking uphill'- neg  . GERD (gastroesophageal reflux disease)   . History of hiatal hernia   . History of kidney surgery    left kidney removed due to cancer 2017  . Hypertension   . Hypothyroidism   . Left renal mass   . Macular degeneration of both eyes    dry  . PONV (postoperative nausea and vomiting)     Patient Active Problem List   Diagnosis Date Noted  . Neoplasm of left kidney 04/18/2016    Past Surgical History:  Procedure Laterality Date  . APPENDECTOMY    . CYSTOSCOPY WITH RETROGRADE PYELOGRAM, URETEROSCOPY AND STENT PLACEMENT Left 04/01/2016   Procedure: CYSTOSCOPY WITH RETROGRADE PYELOGRAM, left ureter;  Surgeon: Raynelle Bring, MD;  Location: [facility];  Service: Urology;  Laterality: Left;  . HEMORRHOID SURGERY    . HERNIA REPAIR     inguinal-3 on left , 1 on right  . INCISIONAL HERNIA REPAIR N/A 12/23/2016   Procedure: LAPAROSCOPIC VERSUS  OPEN LYSIS OF ADHESIONS AND INCISIONAL HERNIA REPAIR WITH MESH;  Surgeon: Ralene Ok, MD;  Location: [place];  Service: General;  Laterality: N/A;  . INSERTION OF MESH N/A 12/23/2016   Procedure: INSERTION OF MESH;  Surgeon: Ralene Ok, MD;  Location: [place];  Service: General;  Laterality: N/A;  . LAPAROSCOPIC NEPHRECTOMY Left 04/18/2016   Procedure: LAPAROSCOPIC RADICAL  LEFT NEPHRECTOMY;  Surgeon: Raynelle Bring, MD;  Location: [facility];  Service: Urology;  Laterality: Left;  . NASAL SINUS SURGERY     left side  . THYROIDECTOMY, PARTIAL     left small amount on left side-for cancer  . TONSILLECTOMY         Home Medications    Prior to Admission medications   Medication Sig Start Date End Date Taking? Authorizing Provider  atenolol-chlorthalidone (TENORETIC) 50-25 MG tablet Take 1 tablet by mouth daily.  02/21/16   Historical Provider, MD  atorvastatin (LIPITOR) 20 MG tablet Take 20 mg by mouth at bedtime.    Historical Provider, MD  Coenzyme Q10 (COQ10) 100 MG CAPS Take 1 capsule by mouth daily.    Historical Provider, MD  KLOR-CON M20 20 MEQ tablet Take 20 mEq by mouth daily. 02/27/16   Historical Provider, MD  lansoprazole (PREVACID)  15 MG capsule Take 15 mg by mouth at bedtime.    Historical Provider, MD  Multiple Vitamins-Minerals (PRESERVISION AREDS PO) Take 1 capsule by mouth daily.    Historical Provider, MD  Omega-3 Fatty Acids (FISH OIL) 1200 MG CAPS Take 2 capsules by mouth 2 (two) times daily.    Historical Provider, MD  OVER THE COUNTER MEDICATION Take 1 tablet by mouth daily. Cognium - Brain Vitamin    Historical Provider, MD  oxyCODONE-acetaminophen (ROXICET) 5-325 MG tablet Take 1-2 tablets by mouth every 4 (four) hours as needed. 12/23/16   Ralene Ok, MD  PARoxetine (PAXIL-CR) 25 MG 24 hr tablet Take 25 mg by mouth daily. 02/21/16   Historical Provider, MD  SYNTHROID 125 MCG tablet Take 125 mcg by mouth daily.  01/10/16   Historical Provider, MD    Family  History History reviewed. No pertinent family history.  Social History Social History  Substance Use Topics  . Smoking status: Never Smoker  . Smokeless tobacco: Never Used  . Alcohol use No     Allergies   Sulfa antibiotics   Review of Systems Review of Systems  Constitutional: Positive for appetite change and fatigue.  Gastrointestinal: Positive for constipation.  All other systems reviewed and are negative.    Physical Exam Updated Vital Signs BP 107/66   Pulse 74   Temp 98.7 F (37.1 C) (Oral)   Resp 23   Ht 6\' 3"  (1.905 m)   Wt 205 lb (93 kg)   SpO2 97%   BMI 25.62 kg/m   Physical Exam  Constitutional: He is oriented to person, place, and time. He appears well-developed. He appears distressed.  HENT:  Head: Normocephalic and atraumatic.  Right Ear: External ear normal.  Left Ear: External ear normal.  Nose: Nose normal.  Mouth/Throat: Mucous membranes are dry.  Eyes: Conjunctivae and EOM are normal. Pupils are equal, round, and reactive to light.  Neck: Normal range of motion. Neck supple.  Cardiovascular: Normal rate, regular rhythm, normal heart sounds and intact distal pulses.   Pulmonary/Chest: Breath sounds normal. Tachypnea noted.  Abdominal: Soft. Bowel sounds are decreased.  Surgical incisions look good  Musculoskeletal: Normal range of motion.  Neurological: He is alert and oriented to person, place, and time.  Skin: Skin is warm.  Psychiatric: He has a normal mood and affect. His behavior is normal. Judgment and thought content normal.  Nursing note and vitals reviewed.    ED Treatments / Results  Labs (all labs ordered are listed, but only abnormal results are displayed) Labs Reviewed  COMPREHENSIVE METABOLIC PANEL - Abnormal; Notable for the following:       Result Value   Sodium 123 (*)    Chloride 89 (*)    CO2 18 (*)    Glucose, Bld 141 (*)    BUN 31 (*)    Creatinine, Ser 2.00 (*)    Calcium 8.3 (*)    Total Protein 5.9 (*)     Albumin 3.3 (*)    ALT 13 (*)    Total Bilirubin 2.4 (*)    GFR calc non Af Amer 32 (*)    GFR calc Af Amer 37 (*)    Anion gap 16 (*)    All other components within normal limits  CBC WITH DIFFERENTIAL/PLATELET - Abnormal; Notable for the following:    WBC 19.6 (*)    RBC 4.00 (*)    Hemoglobin 12.1 (*)    HCT 34.7 (*)    Platelets  122 (*)    Neutro Abs 18.5 (*)    Lymphs Abs 0.5 (*)    All other components within normal limits  URINALYSIS, ROUTINE W REFLEX MICROSCOPIC - Abnormal; Notable for the following:    Hgb urine dipstick MODERATE (*)    Protein, ur 30 (*)    Leukocytes, UA TRACE (*)    Bacteria, UA RARE (*)    Squamous Epithelial / LPF 0-5 (*)    All other components within normal limits  I-STAT CG4 LACTIC ACID, ED - Abnormal; Notable for the following:    Lactic Acid, Venous 4.68 (*)    All other components within normal limits  I-STAT CG4 LACTIC ACID, ED - Abnormal; Notable for the following:    Lactic Acid, Venous 2.85 (*)    All other components within normal limits  CULTURE, BLOOD (ROUTINE X 2)  CULTURE, BLOOD (ROUTINE X 2)  URINE CULTURE  INFLUENZA PANEL BY PCR (TYPE A & B)  TROPONIN I    EKG  EKG Interpretation  Date/Time:  Wednesday December 25 2016 15:07:11 EST Ventricular Rate:  76 PR Interval:    QRS Duration: 129 QT Interval:  395 QTC Calculation: 445 R Axis:   49 Text Interpretation:  Sinus rhythm Nonspecific intraventricular conduction delay Confirmed by Tajanae Guilbault MD, Ketina Mars ([id number]) on 12/25/2016 3:16:39 PM       Radiology Ct Abdomen Pelvis Wo Contrast  Result Date: 12/25/2016 CLINICAL DATA:  Generalized body aches.  Abdominal pain. EXAM: CT ABDOMEN AND PELVIS WITHOUT CONTRAST TECHNIQUE: Multidetector CT imaging of the abdomen and pelvis was performed following the standard protocol without IV contrast. COMPARISON:  11/29/2016 FINDINGS: Lower chest: Dependent opacities in the lung bases, likely atelectasis. Heart is normal size. Coronary  artery calcifications in the left main, left anterior and left circumflex coronary arteries. Trace bilateral effusions. Hepatobiliary: No focal hepatic abnormality. Gallbladder unremarkable. Pancreas: No focal abnormality or ductal dilatation. Spleen: Mild splenomegaly with a craniocaudal length of the spleen measuring 14.4 cm. Findings are stable. Adrenals/Urinary Tract: Left kidney is absent. Multiple calcifications noted along the posterior bladder wall. The bladder wall appears thickened. Foley catheter is in place within the bladder which is decompressed. Punctate calcification near the right UVJ could be knee right ureteral stone. No hydronephrosis on the right. No renal stones. Adrenal glands are unremarkable. Stomach/Bowel: Scattered left colonic diverticulosis. No active diverticulitis. No evidence of bowel obstruction. Vascular/Lymphatic: Aortic and iliac calcifications. No aneurysm or adenopathy. Reproductive: Prostate enlargement and calcifications. Other: Trace free fluid in the pelvis. Fluid is noted in the midline of the anterior abdominal wall, in the area of prior hernia, likely postoperative fluid collection. This measures 3.8 x 3.6 cm. Stranding noted within the subcutaneous soft tissues of the anterior abdominal wall, likely postoperative. A few locules of gas in the subcutaneous soft tissues, likely postoperative. Musculoskeletal: No acute bony abnormality. IMPRESSION: Postoperative changes from ventral hernia repair. Fluid collection at the site of prior ventral hernia measures 3.8 x 3.6 cm. This could represent postoperative seroma, hematoma or abscess. Trace bilateral pleural effusions. Bibasilar dependent opacities, likely atelectasis. Stable splenomegaly. Prostate enlargement with calcifications along the posterior bladder wall, most of which are likely in the collapsed lumen although at least 1 right posterior calcification on image 85 could be within the wall of the bladder. Also, an  adjacent calcification is near the right ureterovesical junction. No hydronephrosis. Scattered colonic diverticulosis.  No active diverticulitis. Electronically Signed   By: Rolm Baptise M.D.   On: 12/25/2016 14:31  Dg Chest 2 View  Result Date: 12/25/2016 CLINICAL DATA:  Short of breath.  Fall today. EXAM: CHEST  2 VIEW COMPARISON:  10/24/2016 FINDINGS: Heart size upper normal. Mild vascular congestion with interstitial edema. Small pleural effusions bilaterally. Mild bibasilar atelectasis with hypoventilation. No acute skeletal abnormality. IMPRESSION: Mild heart failure with interstitial edema and small pleural effusions. Mild bibasilar atelectasis. Electronically Signed   By: Franchot Gallo M.D.   On: 12/25/2016 14:45   Dg Knee Complete 4 Views Left  Result Date: 12/25/2016 CLINICAL DATA:  Bilateral knee pain.  Fell. EXAM: LEFT KNEE - COMPLETE 4+ VIEW; RIGHT KNEE - COMPLETE 4+ VIEW COMPARISON:  None. FINDINGS: The patellofemoral joint spaces are narrowed bilaterally. The medial and lateral compartments are fairly well maintained. There is also mild scalloping of the distal femur on lateral films. These findings can be seen with CPPD arthropathy. No obvious chondrocalcinosis. No acute fracture or osteochondral lesion. IMPRESSION: 1. No acute fracture. 2. Patellofemoral joint degenerative changes, small joint effusions and scalloping of the distal femurs most typically seen with CPPD arthropathy. No definite chondrocalcinosis. Electronically Signed   By: Marijo Sanes M.D.   On: 12/25/2016 14:59   Dg Knee Complete 4 Views Right  Result Date: 12/25/2016 CLINICAL DATA:  Bilateral knee pain.  Fell. EXAM: LEFT KNEE - COMPLETE 4+ VIEW; RIGHT KNEE - COMPLETE 4+ VIEW COMPARISON:  None. FINDINGS: The patellofemoral joint spaces are narrowed bilaterally. The medial and lateral compartments are fairly well maintained. There is also mild scalloping of the distal femur on lateral films. These findings can be seen  with CPPD arthropathy. No obvious chondrocalcinosis. No acute fracture or osteochondral lesion. IMPRESSION: 1. No acute fracture. 2. Patellofemoral joint degenerative changes, small joint effusions and scalloping of the distal femurs most typically seen with CPPD arthropathy. No definite chondrocalcinosis. Electronically Signed   By: Marijo Sanes M.D.   On: 12/25/2016 14:59    Procedures Procedures (including critical care time)  Medications Ordered in ED Medications  HYDROmorphone (DILAUDID) injection 1 mg (not administered)  sodium chloride 0.9 % bolus 1,000 mL (not administered)  sodium chloride 0.9 % bolus 1,000 mL (0 mLs Intravenous Stopped 12/25/16 1320)  sodium chloride 0.9 % bolus 1,000 mL (0 mLs Intravenous Stopped 12/25/16 1452)  morphine 4 MG/ML injection 4 mg (4 mg Intravenous Given 12/25/16 1315)  ondansetron (ZOFRAN) injection 4 mg (4 mg Intravenous Given 12/25/16 1315)  morphine 4 MG/ML injection 4 mg (4 mg Intravenous Given 12/25/16 1501)  sodium chloride 0.9 % bolus 1,000 mL (1,000 mLs Intravenous New Bag/Given 12/25/16 1501)     Initial Impression / Assessment and Plan / ED Course  I have reviewed the triage vital signs and the nursing notes.  Pertinent labs & imaging results that were available during my care of the patient were reviewed by me and considered in my medical decision making (see chart for details).    Pt's lactic acidosis has improved with IVFs.  He was d/w surgery who will see him due to the fluid collection and recent surgery.  Pt has multiple medical issues going on as well, so they requested that we admit to medicine.  Surgery has seen patient and do not feel like this is a surgical issue.  Pt d/w hospitalists who will admit.    Final Clinical Impressions(s) / ED Diagnoses   Final diagnoses:  Dehydration  AKI (acute kidney injury) ([place])  Lactic acidosis  S/P repair of ventral hernia  SIRS (systemic inflammatory response syndrome) ([place])  Presence of indwelling Foley catheter  Urinary retention    New Prescriptions New Prescriptions   No medications on file     Isla Pence, MD 12/25/16 [phone]

## 2016-12-26 DIAGNOSIS — E039 Hypothyroidism, unspecified: Secondary | ICD-10-CM | POA: Diagnosis not present

## 2016-12-26 DIAGNOSIS — E876 Hypokalemia: Secondary | ICD-10-CM | POA: Diagnosis not present

## 2016-12-26 DIAGNOSIS — R339 Retention of urine, unspecified: Secondary | ICD-10-CM | POA: Diagnosis not present

## 2016-12-26 DIAGNOSIS — J90 Pleural effusion, not elsewhere classified: Secondary | ICD-10-CM | POA: Diagnosis not present

## 2016-12-26 DIAGNOSIS — E871 Hypo-osmolality and hyponatremia: Secondary | ICD-10-CM | POA: Diagnosis not present

## 2016-12-26 DIAGNOSIS — I2510 Atherosclerotic heart disease of native coronary artery without angina pectoris: Secondary | ICD-10-CM | POA: Diagnosis not present

## 2016-12-26 DIAGNOSIS — K219 Gastro-esophageal reflux disease without esophagitis: Secondary | ICD-10-CM | POA: Diagnosis not present

## 2016-12-26 DIAGNOSIS — I129 Hypertensive chronic kidney disease with stage 1 through stage 4 chronic kidney disease, or unspecified chronic kidney disease: Secondary | ICD-10-CM | POA: Diagnosis not present

## 2016-12-26 DIAGNOSIS — K432 Incisional hernia without obstruction or gangrene: Secondary | ICD-10-CM | POA: Diagnosis not present

## 2016-12-26 DIAGNOSIS — E860 Dehydration: Secondary | ICD-10-CM | POA: Diagnosis not present

## 2016-12-26 DIAGNOSIS — N179 Acute kidney failure, unspecified: Secondary | ICD-10-CM | POA: Diagnosis not present

## 2016-12-26 DIAGNOSIS — Z79899 Other long term (current) drug therapy: Secondary | ICD-10-CM | POA: Diagnosis not present

## 2016-12-26 DIAGNOSIS — E861 Hypovolemia: Secondary | ICD-10-CM | POA: Diagnosis not present

## 2016-12-26 DIAGNOSIS — A419 Sepsis, unspecified organism: Secondary | ICD-10-CM | POA: Diagnosis not present

## 2016-12-26 DIAGNOSIS — R6520 Severe sepsis without septic shock: Secondary | ICD-10-CM | POA: Diagnosis not present

## 2016-12-26 DIAGNOSIS — E872 Acidosis: Secondary | ICD-10-CM | POA: Diagnosis not present

## 2016-12-26 DIAGNOSIS — N183 Chronic kidney disease, stage 3 (moderate): Secondary | ICD-10-CM | POA: Diagnosis not present

## 2016-12-26 DIAGNOSIS — Z882 Allergy status to sulfonamides status: Secondary | ICD-10-CM | POA: Diagnosis not present

## 2016-12-26 LAB — BASIC METABOLIC PANEL
ANION GAP: 11 (ref 5–15)
ANION GAP: 9 (ref 5–15)
BUN: 27 mg/dL — AB (ref 6–20)
BUN: 28 mg/dL — AB (ref 6–20)
CALCIUM: 7.5 mg/dL — AB (ref 8.9–10.3)
CO2: 20 mmol/L — AB (ref 22–32)
CO2: 20 mmol/L — AB (ref 22–32)
Calcium: 7.7 mg/dL — ABNORMAL LOW (ref 8.9–10.3)
Chloride: 97 mmol/L — ABNORMAL LOW (ref 101–111)
Chloride: 100 mmol/L — ABNORMAL LOW (ref 101–111)
Creatinine, Ser: 1.78 mg/dL — ABNORMAL HIGH (ref 0.61–1.24)
Creatinine, Ser: 1.8 mg/dL — ABNORMAL HIGH (ref 0.61–1.24)
GFR calc Af Amer: 43 mL/min — ABNORMAL LOW (ref >60)
GFR calc Af Amer: 42 mL/min — ABNORMAL LOW (ref >60)
GFR calc non Af Amer: 37 mL/min — ABNORMAL LOW (ref >60)
GFR calc non Af Amer: 36 mL/min — ABNORMAL LOW (ref >60)
GLUCOSE: 106 mg/dL — AB (ref 65–99)
GLUCOSE: 110 mg/dL — AB (ref 65–99)
POTASSIUM: 3.4 mmol/L — AB (ref 3.5–5.1)
Potassium: 3.8 mmol/L (ref 3.5–5.1)
Sodium: 126 mmol/L — ABNORMAL LOW (ref 135–145)
Sodium: 131 mmol/L — ABNORMAL LOW (ref 135–145)

## 2016-12-26 LAB — URINE CULTURE: Culture: NO GROWTH

## 2016-12-26 LAB — CBC
HEMATOCRIT: 32.8 % — AB (ref 39.0–52.0)
HEMOGLOBIN: 11 g/dL — AB (ref 13.0–17.0)
MCH: 29.7 pg (ref 26.0–34.0)
MCHC: 33.5 g/dL (ref 30.0–36.0)
MCV: 88.6 fL (ref 78.0–100.0)
Platelets: 93 10*3/uL — ABNORMAL LOW (ref 150–400)
RBC: 3.7 MIL/uL — ABNORMAL LOW (ref 4.22–5.81)
RDW: 14.9 % (ref 11.5–15.5)
WBC: 11.4 10*3/uL — ABNORMAL HIGH (ref 4.0–10.5)

## 2016-12-26 MED ORDER — POTASSIUM CHLORIDE CRYS ER 20 MEQ PO TBCR
40.0000 meq | EXTENDED_RELEASE_TABLET | Freq: Once | ORAL | Status: AC
  Administered 2016-12-26: 40 meq via ORAL
  Filled 2016-12-26: qty 2

## 2016-12-26 MED ORDER — ENSURE ENLIVE PO LIQD
237.0000 mL | Freq: Two times a day (BID) | ORAL | Status: DC
  Administered 2016-12-27 (×2): 237 mL via ORAL

## 2016-12-26 NOTE — Progress Notes (Signed)
PROGRESS NOTE    FITZWILLIAM TEEPLES  [id number] DOB: [birth year] DOA: 12/25/2016 PCP: Dwan Bolt, MD     Brief Narrative:  Eric Suarez is a 71 y.o. male with medical history significant of hyperlipidemia, hypertension, hypothyroidism, left nephrectomy for renal cell carcinoma who presents 2 days status post ventral hernia repair. He states that since he has been discharged after his procedure, he has had very little oral intake. He also admits to generalized muscle and joint pains, joint swelling, generalized weakness, decreased oral intake in food and liquids, productive cough yellow sputum, low-grade fever as well as one-time episode of vomiting. He denies any rigors, chest pain, shortness of breath, nausea or abdominal pain, has not had a bowel movement since Sunday. CT abdomen and pelvis were obtained which showed small fluid collection. Case was discussed with general surgery and they have evaluated patient in consult. He was given IV fluids, with improvement in acidosis. [facility] called for admission.   Assessment & Plan:   Principal Problem:   Severe sepsis ([place]) Active Problems:   Flu-like symptoms   Dehydration with hyponatremia   AKI (acute kidney injury) ([place])   CKD (chronic kidney disease) stage 3, GFR 30-59 ml/min   S/P repair of ventral hernia   Hypothyroidism   HTN (hypertension)   Severe sepsis secondary to flu-like illness -Symptoms of generalize body aches, joint pains, weakness, decreased appetite, cough with yellow sputum, with lactic acidosis of 4.68 and WBC 19.6 on admission  -CXR negative for pneumonia  -Influenza screen was negative, however this could represent false negative. Will treat with tamiflu regardless  -Blood culture pending  Hypovolemic hyponatremia -Urine studies consistent with extrarenal hypovolemic hyponatremia  -Hold chlorthalidone  -Improving with IVF  -Trend BMP   Hypokalemia -Replace and trend   AKI on CKD stage 3, s/p  left nephrectomy due to RCC  -Baseline Cr 1.5, Cr is 2 on admission -UA unremarkable, WBC 6-30, rate bacteria, trace leuk, negative nitrite.   -Suspect this is due to dehydration as well as poor PO intake -Hold chlorthalidone  -IVF -Trend BMP  -Stable   Post-op fluid collection after ventral hernia repair 12/23/16 -CCS consulted, likely a benign post-op finding  -Monitor for now   Urinary retention -Developed after surgery, patient was discharged home with foley in place -Will need to follow up with his urologist as outpatient   Hypothyroidism -Continue synthroid  HTN -Will hold chlorthalidone in setting of AKI and hyponatremia  -Will hold atenolol due to low BP and volume resuscitation     DVT prophylaxis: subq hep Code Status: full Family Communication:  No family at bedside Disposition Plan: pending further improvement, suspect return home   Consultants:   CCS  Procedures:   None  Antimicrobials:   None    Subjective: Doing much better today compared to yesterday. States his joint pains are not as bad. Overall feeling better. No complaints, no CP SOB N/V/abdominal pain.    Objective: Vitals:   12/25/16 1645 12/25/16 1828 12/25/16 2210 12/26/16 0515  BP: 111/67 (!) 106/53 (!) 105/52 107/60  Pulse: 79 78 80 (!) 117  Resp: 17 18 18 18   Temp:  98.1 F (36.7 C) 97.8 F (36.6 C) 98.1 F (36.7 C)  TempSrc:  Oral Oral Oral  SpO2: 94% 90% 93% 95%  Weight:      Height:        Intake/Output Summary (Last 24 hours) at 12/26/16 1311 Last data filed at 12/26/16 0516  Gross per 24  hour  Intake             2300 ml  Output             2125 ml  Net              175 ml   Filed Weights   12/25/16 1125  Weight: 93 kg (205 lb)    Examination:  General exam: Appears calm and comfortable, dry mucosa  Respiratory system: Clear to auscultation. Respiratory effort normal. Cardiovascular system: S1 & S2 heard, RRR. No JVD, murmurs, rubs, gallops or clicks. No  pedal edema. Gastrointestinal system: Abdomen is nondistended, soft and nontender. No organomegaly or masses felt. Normal bowel sounds heard. Central nervous system: Alert and oriented. No focal neurological deficits. Extremities: Symmetric 5 x 5 power. Skin: No rashes, lesions or ulcers Psychiatry: Judgement and insight appear normal. Mood & affect appropriate.   Data Reviewed: I have personally reviewed following labs and imaging studies  CBC:  Recent Labs Lab 12/25/16 1129 12/25/16 1857 12/26/16 0555  WBC 19.6* 12.3* 11.4*  NEUTROABS 18.5*  --   --   HGB 12.1* 11.2* 11.0*  HCT 34.7* 31.8* 32.8*  MCV 86.8 86.9 88.6  PLT 122* PLATELET CLUMPS NOTED ON SMEAR, COUNT APPEARS DECREASED 93*   Basic Metabolic Panel:  Recent Labs Lab 12/25/16 1129 12/25/16 1857 12/26/16 0010 12/26/16 0555  NA 123* 126* 126* 131*  K 3.6 3.8 3.8 3.4*  CL 89* 96* 97* 100*  CO2 18* 21* 20* 20*  GLUCOSE 141* 121* 110* 106*  BUN 31* 28* 28* 27*  CREATININE 2.00* 1.77* 1.78* 1.80*  CALCIUM 8.3* 7.5* 7.5* 7.7*   GFR: Estimated Creatinine Clearance: 45 mL/min (by C-G formula based on SCr of 1.8 mg/dL (H)). Liver Function Tests:  Recent Labs Lab 12/25/16 1129  AST 30  ALT 13*  ALKPHOS 47  BILITOT 2.4*  PROT 5.9*  ALBUMIN 3.3*   No results for input(s): LIPASE, AMYLASE in the last 168 hours. No results for input(s): AMMONIA in the last 168 hours. Coagulation Profile: No results for input(s): INR, PROTIME in the last 168 hours. Cardiac Enzymes:  Recent Labs Lab 12/25/16 1339  TROPONINI <0.03   BNP (last 3 results) No results for input(s): PROBNP in the last 8760 hours. HbA1C: No results for input(s): HGBA1C in the last 72 hours. CBG: No results for input(s): GLUCAP in the last 168 hours. Lipid Profile: No results for input(s): CHOL, HDL, LDLCALC, TRIG, CHOLHDL, LDLDIRECT in the last 72 hours. Thyroid Function Tests: No results for input(s): TSH, T4TOTAL, FREET4, T3FREE,  THYROIDAB in the last 72 hours. Anemia Panel: No results for input(s): VITAMINB12, FOLATE, FERRITIN, TIBC, IRON, RETICCTPCT in the last 72 hours. Sepsis Labs:  Recent Labs Lab 12/25/16 1141 12/25/16 1416  LATICACIDVEN 4.68* 2.85*    Recent Results (from the past 240 hour(s))  Urine culture     Status: None   Collection Time: 12/25/16 12:32 PM  Result Value Ref Range Status   Specimen Description URINE, RANDOM  Final   Special Requests NONE  Final   Culture NO GROWTH  Final   Report Status 12/26/2016 FINAL  Final       Radiology Studies: Ct Abdomen Pelvis Wo Contrast  Result Date: 12/25/2016 CLINICAL DATA:  Generalized body aches.  Abdominal pain. EXAM: CT ABDOMEN AND PELVIS WITHOUT CONTRAST TECHNIQUE: Multidetector CT imaging of the abdomen and pelvis was performed following the standard protocol without IV contrast. COMPARISON:  11/29/2016 FINDINGS: Lower chest: Dependent  opacities in the lung bases, likely atelectasis. Heart is normal size. Coronary artery calcifications in the left main, left anterior and left circumflex coronary arteries. Trace bilateral effusions. Hepatobiliary: No focal hepatic abnormality. Gallbladder unremarkable. Pancreas: No focal abnormality or ductal dilatation. Spleen: Mild splenomegaly with a craniocaudal length of the spleen measuring 14.4 cm. Findings are stable. Adrenals/Urinary Tract: Left kidney is absent. Multiple calcifications noted along the posterior bladder wall. The bladder wall appears thickened. Foley catheter is in place within the bladder which is decompressed. Punctate calcification near the right UVJ could be knee right ureteral stone. No hydronephrosis on the right. No renal stones. Adrenal glands are unremarkable. Stomach/Bowel: Scattered left colonic diverticulosis. No active diverticulitis. No evidence of bowel obstruction. Vascular/Lymphatic: Aortic and iliac calcifications. No aneurysm or adenopathy. Reproductive: Prostate enlargement  and calcifications. Other: Trace free fluid in the pelvis. Fluid is noted in the midline of the anterior abdominal wall, in the area of prior hernia, likely postoperative fluid collection. This measures 3.8 x 3.6 cm. Stranding noted within the subcutaneous soft tissues of the anterior abdominal wall, likely postoperative. A few locules of gas in the subcutaneous soft tissues, likely postoperative. Musculoskeletal: No acute bony abnormality. IMPRESSION: Postoperative changes from ventral hernia repair. Fluid collection at the site of prior ventral hernia measures 3.8 x 3.6 cm. This could represent postoperative seroma, hematoma or abscess. Trace bilateral pleural effusions. Bibasilar dependent opacities, likely atelectasis. Stable splenomegaly. Prostate enlargement with calcifications along the posterior bladder wall, most of which are likely in the collapsed lumen although at least 1 right posterior calcification on image 85 could be within the wall of the bladder. Also, an adjacent calcification is near the right ureterovesical junction. No hydronephrosis. Scattered colonic diverticulosis.  No active diverticulitis. Electronically Signed   By: Rolm Baptise M.D.   On: 12/25/2016 14:31   Dg Chest 2 View  Result Date: 12/25/2016 CLINICAL DATA:  Short of breath.  Fall today. EXAM: CHEST  2 VIEW COMPARISON:  10/24/2016 FINDINGS: Heart size upper normal. Mild vascular congestion with interstitial edema. Small pleural effusions bilaterally. Mild bibasilar atelectasis with hypoventilation. No acute skeletal abnormality. IMPRESSION: Mild heart failure with interstitial edema and small pleural effusions. Mild bibasilar atelectasis. Electronically Signed   By: Franchot Gallo M.D.   On: 12/25/2016 14:45   Dg Knee Complete 4 Views Left  Result Date: 12/25/2016 CLINICAL DATA:  Bilateral knee pain.  Fell. EXAM: LEFT KNEE - COMPLETE 4+ VIEW; RIGHT KNEE - COMPLETE 4+ VIEW COMPARISON:  None. FINDINGS: The patellofemoral  joint spaces are narrowed bilaterally. The medial and lateral compartments are fairly well maintained. There is also mild scalloping of the distal femur on lateral films. These findings can be seen with CPPD arthropathy. No obvious chondrocalcinosis. No acute fracture or osteochondral lesion. IMPRESSION: 1. No acute fracture. 2. Patellofemoral joint degenerative changes, small joint effusions and scalloping of the distal femurs most typically seen with CPPD arthropathy. No definite chondrocalcinosis. Electronically Signed   By: Marijo Sanes M.D.   On: 12/25/2016 14:59   Dg Knee Complete 4 Views Right  Result Date: 12/25/2016 CLINICAL DATA:  Bilateral knee pain.  Fell. EXAM: LEFT KNEE - COMPLETE 4+ VIEW; RIGHT KNEE - COMPLETE 4+ VIEW COMPARISON:  None. FINDINGS: The patellofemoral joint spaces are narrowed bilaterally. The medial and lateral compartments are fairly well maintained. There is also mild scalloping of the distal femur on lateral films. These findings can be seen with CPPD arthropathy. No obvious chondrocalcinosis. No acute fracture or osteochondral lesion.  IMPRESSION: 1. No acute fracture. 2. Patellofemoral joint degenerative changes, small joint effusions and scalloping of the distal femurs most typically seen with CPPD arthropathy. No definite chondrocalcinosis. Electronically Signed   By: Marijo Sanes M.D.   On: 12/25/2016 14:59      Scheduled Meds: . atorvastatin  20 mg Oral QHS  . heparin  5,000 Units Subcutaneous Q8H  .  HYDROmorphone (DILAUDID) injection  1 mg Intravenous Once  . levothyroxine  125 mcg Oral QAC breakfast  . oseltamivir  30 mg Oral BID  . pantoprazole  20 mg Oral QHS  . PARoxetine  25 mg Oral Daily   Continuous Infusions: . sodium chloride 1,000 mL (12/26/16 0554)     LOS: 0 days    Time spent: 30 minutes   Dessa Phi, DO [facility] [email or web address] Password [email or web address] 12/26/2016, 1:11 PM

## 2016-12-26 NOTE — Progress Notes (Signed)
Initial Nutrition Assessment  DOCUMENTATION CODES:   Not applicable  INTERVENTION:   -Ensure Enlive po BID, each supplement provides 350 kcal and 20 grams of protein  NUTRITION DIAGNOSIS:   Inadequate oral intake related to poor appetite, nausea as evidenced by meal completion < 25%, per patient/family report.  GOAL:   Patient will meet greater than or equal to 90% of their needs  MONITOR:   PO intake, Supplement acceptance, Labs, Weight trends, Skin, I & O's  REASON FOR ASSESSMENT:   Malnutrition Screening Tool    ASSESSMENT:   Eric Suarez is a 71 y.o. male with medical history significant of hyperlipidemia, hypertension, hypothyroidism, left nephrectomy for renal cell carcinoma who presents 2 days status post ventral hernia repair. He states that since he has been discharged after his procedure, he has had very little oral intake.  Pt admitted with severe sepsis.   Spoke with pt at bedside, who reports very minimal intake over the past 4-5 days. Pt shares he had surgery on Monday and was NPO upon preparation for surgery, however, experienced poor appetite and nausea post-op and has eaten very little since. Pt shares that he consumed only one bowl of corn flakes for breakfast this evening.   Pt reports appetite is generally fair prior to this week. He consumes 2 meals per day, consisting of meat, starch, and vegetable.   Pt reports that he is generally fairly active, but has "slowed down some" since retiring. He shares that activity has declined over the past week related to leg pain. Nutrition-Focused physical exam completed. Findings are mild fat depletion, no muscle depletion, and moderate edema.   Discussed importance of good nutritional intake to promote healing. Pt amenable to try supplements.   Labs reviewed: Na: 126 (on IV supplementation).  Diet Order:  Diet Heart Room service appropriate? Yes; Fluid consistency: Thin  Skin:   (closed abdominal  incision)  Last BM:  12/22/16  Height:   Ht Readings from Last 1 Encounters:  12/25/16 6\' 3"  (1.905 m)    Weight:   Wt Readings from Last 1 Encounters:  12/25/16 205 lb (93 kg)    Ideal Body Weight:  89.1 kg  BMI:  Body mass index is 25.62 kg/m.  Estimated Nutritional Needs:   Kcal:  2000-2200  Protein:  100-115 grams  Fluid:  2.0-2.2 L  EDUCATION NEEDS:   Education needs addressed  Alli Jasmer A. Jimmye Norman, RD, LDN, CDE Pager: [phone] After hours Pager: [phone]

## 2016-12-26 NOTE — Care Management Obs Status (Signed)
[facility] NOTIFICATION   Patient Details  Name: Eric Suarez MRN: [id number] Date of Birth: Jan 13, 1946   Medicare Observation Status Notification Given:  Yes    Sharin Mons, RN 12/26/2016, 12:29 PM

## 2016-12-27 LAB — CBC WITH DIFFERENTIAL/PLATELET
BASOS ABS: 0 10*3/uL (ref 0.0–0.1)
BASOS PCT: 0 %
EOS ABS: 0 10*3/uL (ref 0.0–0.7)
EOS PCT: 0 %
HCT: 32.3 % — ABNORMAL LOW (ref 39.0–52.0)
Hemoglobin: 11 g/dL — ABNORMAL LOW (ref 13.0–17.0)
Lymphocytes Relative: 8 %
Lymphs Abs: 0.7 10*3/uL (ref 0.7–4.0)
MCH: 30.3 pg (ref 26.0–34.0)
MCHC: 34.1 g/dL (ref 30.0–36.0)
MCV: 89 fL (ref 78.0–100.0)
MONO ABS: 0.5 10*3/uL (ref 0.1–1.0)
Monocytes Relative: 6 %
NEUTROS ABS: 7.8 10*3/uL — AB (ref 1.7–7.7)
Neutrophils Relative %: 86 %
PLATELETS: 109 10*3/uL — AB (ref 150–400)
RBC: 3.63 MIL/uL — ABNORMAL LOW (ref 4.22–5.81)
RDW: 14.9 % (ref 11.5–15.5)
WBC: 9.1 10*3/uL (ref 4.0–10.5)

## 2016-12-27 LAB — BASIC METABOLIC PANEL
ANION GAP: 9 (ref 5–15)
BUN: 26 mg/dL — AB (ref 6–20)
CALCIUM: 7.9 mg/dL — AB (ref 8.9–10.3)
CO2: 21 mmol/L — ABNORMAL LOW (ref 22–32)
Chloride: 104 mmol/L (ref 101–111)
Creatinine, Ser: 1.52 mg/dL — ABNORMAL HIGH (ref 0.61–1.24)
GFR calc Af Amer: 51 mL/min — ABNORMAL LOW (ref >60)
GFR, EST NON AFRICAN AMERICAN: 44 mL/min — AB (ref >60)
GLUCOSE: 111 mg/dL — AB (ref 65–99)
Potassium: 3.3 mmol/L — ABNORMAL LOW (ref 3.5–5.1)
Sodium: 134 mmol/L — ABNORMAL LOW (ref 135–145)

## 2016-12-27 MED ORDER — OSELTAMIVIR PHOSPHATE 30 MG PO CAPS: 30.0000 mg | ORAL_CAPSULE | Freq: Two times a day (BID) | ORAL | 0 refills | Status: AC

## 2016-12-27 MED ORDER — POTASSIUM CHLORIDE CRYS ER 20 MEQ PO TBCR
40.0000 meq | EXTENDED_RELEASE_TABLET | Freq: Once | ORAL | Status: AC
  Administered 2016-12-27: 40 meq via ORAL
  Filled 2016-12-27: qty 2

## 2016-12-27 MED ORDER — POLYETHYLENE GLYCOL 3350 17 G PO PACK: 17.0000 g | PACK | Freq: Every day | ORAL | 0 refills | Status: DC

## 2016-12-27 MED ORDER — POLYETHYLENE GLYCOL 3350 17 G PO PACK
17.0000 g | PACK | Freq: Every day | ORAL | Status: DC
  Administered 2016-12-27: 17 g via ORAL
  Filled 2016-12-27: qty 1

## 2016-12-27 MED ORDER — DOCUSATE SODIUM 100 MG PO CAPS: 100.0000 mg | ORAL_CAPSULE | Freq: Every day | ORAL | 0 refills | Status: DC

## 2016-12-27 MED ORDER — DOCUSATE SODIUM 100 MG PO CAPS
100.0000 mg | ORAL_CAPSULE | Freq: Every day | ORAL | Status: DC
Start: 2016-12-27 — End: 2016-12-27
  Administered 2016-12-27: 100 mg via ORAL
  Filled 2016-12-27: qty 1

## 2016-12-27 NOTE — Evaluation (Signed)
Physical Therapy Evaluation Patient Details Name: Eric Suarez MRN: [id number] DOB: [birth year] Today's Date: 12/27/2016   History of Present Illness  71 y.o.maleadmitted with severe sepsis due to flu-like illness. Pt recently status post ventral hernia repair. He states that since he had been discharged after his procedure and had very little oral intake. He also admits to generalized muscle and joint pains, joint swelling, generalized weakness, decreased oral intake in food and liquids, productive cough yellow sputum, low-grade fever as well as one-time episode of vomiting.PMH: hypertension, hypothyroidism, left nephrectomy for renal cell carcinoma, macular degerneration.   Clinical Impression  Pt admitted with above diagnosis. Pt currently with functional limitations due to the deficits listed below (see PT Problem List). Pt able to ambulate 85 ft with rw during initial PT session. Cues provided for safe use of device. Pt and spouse report feeling like they will be able to go home once released from the hospital. Spouse reports that the pt is moving better than he was prior to admission. PT to continue to follow acutely.    Follow Up Recommendations No PT follow up;Supervision for mobility/OOB    Equipment Recommendations  None recommended by PT    Recommendations for Other Services       Precautions / Restrictions Precautions Precautions: Fall Restrictions Weight Bearing Restrictions: No      Mobility  Bed Mobility Overal bed mobility: Needs Assistance Bed Mobility: Supine to Sit     Supine to sit: Min guard     General bed mobility comments: pt needing additional time to get to sitting EOB  Transfers Overall transfer level: Needs assistance Equipment used: Rolling walker (2 wheeled) Transfers: Sit to/from Stand Sit to Stand: Min guard;From elevated surface         General transfer comment: repeat X2 from EOB  Ambulation/Gait Ambulation/Gait assistance: Min  guard Ambulation Distance (Feet): 85 Feet Assistive device: Rolling walker (2 wheeled) Gait Pattern/deviations: Step-through pattern;Decreased step length - right;Decreased step length - left;Trunk flexed Gait velocity: decreased   General Gait Details: cues for staying close to rw, distance limited by fatigue  Stairs Stairs:  (pt/spouse decline performing, report feeling confident)          Wheelchair Mobility    Modified Rankin (Stroke Patients Only)       Balance Overall balance assessment: Needs assistance Sitting-balance support: No upper extremity supported Sitting balance-Leahy Scale: Good     Standing balance support: During functional activity Standing balance-Leahy Scale: Fair Standing balance comment: using rw during ambulation                             Pertinent Vitals/Pain Pain Assessment: No/denies pain (hands and feet are stiff)    Home Living Family/patient expects to be discharged to:: Private residence Living Arrangements: Spouse/significant other Available Help at Discharge: Family;Available 24 hours/day Type of Home: House Home Access: Stairs to enter Entrance Stairs-Rails: Right Entrance Stairs-Number of Steps: 3 Home Layout: One level Home Equipment: Environmental consultant - 2 wheels Additional Comments: pt lives with spouse and son    Prior Function Level of Independence: Needs assistance   Gait / Transfers Assistance Needed: using rw the day prior to admission, typically independent  ADL's / Homemaking Assistance Needed: independent        Hand Dominance        Extremity/Trunk Assessment   Upper Extremity Assessment Upper Extremity Assessment: Generalized weakness    Lower Extremity Assessment Lower Extremity Assessment:  Generalized weakness       Communication   Communication: No difficulties  Cognition Arousal/Alertness: Awake/alert Behavior During Therapy: WFL for tasks assessed/performed Overall Cognitive Status:  Within Functional Limits for tasks assessed                      General Comments      Exercises     Assessment/Plan    PT Assessment Patient needs continued PT services  PT Problem List Decreased strength;Decreased activity tolerance;Decreased balance;Decreased mobility          PT Treatment Interventions DME instruction;Gait training;Stair training;Functional mobility training;Therapeutic activities;Therapeutic exercise;Patient/family education    PT Goals (Current goals can be found in the Care Plan section)  Acute Rehab PT Goals Patient Stated Goal: get back home PT Goal Formulation: With patient Time For Goal Achievement: 01/10/17 Potential to Achieve Goals: Good    Frequency Min 3X/week   Barriers to discharge        Co-evaluation               End of Session Equipment Utilized During Treatment: Gait belt Activity Tolerance: Patient limited by fatigue Patient left: in chair;with call bell/phone within reach;with chair alarm set;with family/visitor present Nurse Communication: Mobility status    Functional Assessment Tool Used: clinical judgment Functional Limitation: Mobility: Walking and moving around Mobility: Walking and Moving Around Current Status [phone]): At least 20 percent but less than 40 percent impaired, limited or restricted Mobility: Walking and Moving Around Goal Status [phone]): At least 1 percent but less than 20 percent impaired, limited or restricted    Time: [id number] PT Time Calculation (min) (ACUTE ONLY): 28 min   Charges:   PT Evaluation $PT Eval Moderate Complexity: 1 Procedure PT Treatments $Gait Training: 8-22 mins   PT G Codes:   PT G-Codes **NOT FOR INPATIENT CLASS** Functional Assessment Tool Used: clinical judgment Functional Limitation: Mobility: Walking and moving around Mobility: Walking and Moving Around Current Status [id number]): At least 20 percent but less than 40 percent impaired, limited or  restricted Mobility: Walking and Moving Around Goal Status [phone]): At least 1 percent but less than 20 percent impaired, limited or restricted    Cassell Clement, PT, [place] Pager [phone] Office [phone]  12/27/2016, 2:04 PM

## 2016-12-27 NOTE — Care Management Note (Addendum)
Case Management Note  Patient Details  Name: Eric Suarez MRN: [id number] Date of Birth: 1946-05-31  Subjective/Objective:  Sepsis s/t flu-like illness, Hypovolemic hyponatremia                  Action/Plan: Discharge Planning: AVS reviewed:  NCM spoke to pt and wife at bedside. Pt has RW at home. Requesting 3n1 bedside commode. Contacted [facility] DME rep for equipment to be delivered to room prior to dc. Wife at home to assist with care.  Pt will schedule appt with Urologist to follow up on foley catheter.   PCP Anda Kraft  Expected Discharge Date:  12/27/16               Expected Discharge Plan:  Home/Self Care  In-House Referral:  NA  Discharge planning Services  CM Consult  Post Acute Care Choice:  NA Choice offered to:  NA  DME Arranged:  3-N-1 DME Agency:  [place]:  NA [place] Agency:  NA  Status of Service:  Completed, signed off  If discussed at [place] of Stay Meetings, dates discussed:    Additional Comments:  Erenest Rasher, RN 12/27/2016, 3:33 PM

## 2016-12-27 NOTE — Progress Notes (Signed)
Occupational Therapy Evaluation Patient Details Name: Eric Suarez MRN: [id number] DOB: August 15, 1946 Today's Date: 12/27/2016    History of Present Illness 71 y.o.maleadmitted with severe sepsis due to flu-like illness. Pt recently status post ventral hernia repair. He states that since he had been discharged after his procedure and had very little oral intake. He also admits to generalized muscle and joint pains, joint swelling, generalized weakness, decreased oral intake in food and liquids, productive cough yellow sputum, low-grade fever as well as one-time episode of vomiting.PMH: hypertension, hypothyroidism, left nephrectomy for renal cell carcinoma, macular degerneration.    Clinical Impression   PTA, pt using RW for mobility and modified independent with ADL. Pt with generalized weakness and would benefit from using 3 in1 as showerchair to prevent falls. Pt/wife given additional information regarding reducing risk of falls within the home. Discussed DME recommendation with case manager. OT signing off.     Follow Up Recommendations  No OT follow up;Supervision/Assistance - 24 hour (initially)    Equipment Recommendations  3 in 1 bedside commode    Recommendations for Other Services       Precautions / Restrictions Precautions Precautions: Fall Restrictions Weight Bearing Restrictions: No      Mobility Bed Mobility Overal bed mobility: Needs Assistance Bed Mobility: Supine to Sit     Supine to sit: Min guard     General bed mobility comments: OOB in chair  Transfers Overall transfer level: Needs assistance Equipment used: Rolling walker (2 wheeled) Transfers: Sit to/from Stand Sit to Stand: Supervision             Balance Overall balance assessment: Needs assistance Sitting-balance support: No upper extremity supported Sitting balance-Leahy Scale: Good     Standing balance support: During functional activity Standing balance-Leahy Scale:  Fair                             ADL Overall ADL's : Needs assistance/impaired             Lower Body Bathing: Minimal assistance;Sit to/from stand   Upper Body Dressing : Set up   Lower Body Dressing: Minimal assistance;Sit to/from stand   Toilet Transfer: Supervision/safety;RW     Toileting - Clothing Manipulation Details (indicate cue type and reason): foley     Functional mobility during ADLs: Supervision/safety General ADL Comments: wife assisting with ADL as needed. Educated on recommendation for use of showerchair to reduce risk of falls given patient's generalized weakness. Pt/wife verbalized understanding.      Vision     Perception     Praxis      Pertinent Vitals/Pain Pain Assessment: Faces Faces Pain Scale: Hurts little more Pain Location: abdomen Pain Descriptors / Indicators: Aching;Discomfort Pain Intervention(s): Limited activity within patient's tolerance     Hand Dominance     Extremity/Trunk Assessment Upper Extremity Assessment Upper Extremity Assessment: Generalized weakness   Lower Extremity Assessment Lower Extremity Assessment: Generalized weakness   Cervical / Trunk Assessment Cervical / Trunk Assessment: Normal   Communication Communication Communication: No difficulties   Cognition Arousal/Alertness: Awake/alert Behavior During Therapy: WFL for tasks assessed/performed Overall Cognitive Status: Within Functional Limits for tasks assessed                     General Comments       Exercises       Shoulder Instructions      Home Living Family/patient expects to be discharged  to:: Private residence Living Arrangements: Spouse/significant other Available Help at Discharge: Family;Available 24 hours/day Type of Home: House Home Access: Stairs to enter [employer] of Steps: 3 Entrance Stairs-Rails: Right Home Layout: One level     Bathroom Shower/Tub: Tub/shower unit;Door Shower/tub  characteristics: Door Biochemist, clinical: Standard Bathroom Accessibility: Yes How Accessible: Accessible via walker Home Equipment: Gilford Rile - 2 wheels;Hand held shower head   Additional Comments: pt lives with spouse and son      Prior Functioning/Environment Level of Independence: Needs assistance  Gait / Transfers Assistance Needed: using rw the day prior to admission, typically independent ADL's / Homemaking Assistance Needed: independent            OT Problem List: Decreased strength;Decreased activity tolerance;Decreased knowledge of use of DME or AE;Pain   OT Treatment/Interventions:      OT Goals(Current goals can be found in the care plan section) Acute Rehab OT Goals Patient Stated Goal: get back home OT Goal Formulation: All assessment and education complete, DC therapy  OT Frequency:     Barriers to D/C:            Co-evaluation              End of Session Equipment Utilized During Treatment: Rolling walker Nurse Communication: Mobility status;Other (comment) (need for 3in1)  Activity Tolerance: Patient tolerated treatment well Patient left: in chair;with call bell/phone within reach;with family/visitor present   Time: 1458-1510 OT Time Calculation (min): 12 min Charges:  OT General Charges $OT Visit: 1 Procedure OT Evaluation $OT Eval Low Complexity: 1 Procedure G-Codes:    Ellyssa Zagal,HILLARY 01/01/17, 3:20 PM   [facility], OT/L  [phone] January 01, 2017

## 2016-12-27 NOTE — Progress Notes (Signed)
Eric Suarez to be D/C'd Home per MD order.  Discussed with the patient and all questions fully answered.  VSS, Skin clean, dry and intact without evidence of skin break down, no evidence of skin tears noted. IV catheter discontinued intact. Site without signs and symptoms of complications. Dressing and pressure applied.  An After Visit Summary was printed and given to the patient. Patient received prescription.  D/c education completed with patient/family including follow up instructions, medication list, d/c activities limitations if indicated, with other d/c instructions as indicated by MD - patient able to verbalize understanding, all questions fully answered.   Patient instructed to return to ED, call 911, or call MD for any changes in condition.   Patient escorted via [place], and D/C home via private auto.  [name] 12/27/2016 4:00 PM

## 2016-12-27 NOTE — Discharge Summary (Signed)
Physician Discharge Summary  Eric Suarez [id number] DOB: 31-Dec-1945 DOA: 12/25/2016  PCP: Dwan Bolt, MD  Admit date: 12/25/2016 Discharge date: 12/27/2016  Admitted From: Home Disposition:  Home  Recommendations for Outpatient Follow-up:  1. Follow up with PCP in 1 week 2. Please obtain BMP/CBC in 1 week  3. Please follow up on final blood culture results 4. Follow-up with outpatient urology regarding urinary retention that developed postoperatively  Home Health: No  Equipment/Devices: None   Discharge Condition: Stable CODE STATUS: Full  Diet recommendation: Heart healthy  Brief/Interim Summary: From H&P: Eric Suarez a 71 y.o.malewith medical history significant of hyperlipidemia, hypertension, hypothyroidism, left nephrectomy for renal cell carcinoma who presents 2 days status post ventral hernia repair. He states that since he has been discharged after his procedure, he has had very little oral intake. He also admits to generalized muscle and joint pains, joint swelling, generalized weakness, decreased oral intake in food and liquids, productive cough yellow sputum, low-grade fever as well as one-time episode of vomiting. He denies any rigors, chest pain, shortness of breath, nausea or abdominal pain, has not had a bowel movement since Sunday. CT abdomen and pelvis were obtained which showed small fluid collection. Case was discussed with general surgery and they have evaluated patient in consult. He was given IV fluids, with improvement in acidosis. TRHcalled for admission.  Interim: Gen. surgery did evaluate patient. They felt that this small fluid collection was a benign postoperative finding. They had no further recommendations. Patient was tested for influenza, which was negative. However, most of his symptoms was consistent with influenza. He was treated nevertheless with Tamiflu as test can result false negative. He was also given IV fluids for acute  kidney injury. This was likely due to poor oral intake postoperatively. With IV fluids and Tamiflu, patient continued to improve clinically. His lab findings were also improved prior to discharge. He was evaluated by physical therapy with no further recommendations.  Subjective on day of discharge: Feeling much better today. His shoulder pain as well as knee pains have improved significantly. He has been able to tolerate oral fluids as well as fluid. No complaints of nausea, chest pain or shortness of breath.  Discharge Diagnoses:  Principal Problem:   Severe sepsis ([place]) Active Problems:   Flu-like symptoms   Dehydration with hyponatremia   AKI (acute kidney injury) ([place])   CKD (chronic kidney disease) stage 3, GFR 30-59 ml/min   S/P repair of ventral hernia   Hypothyroidism   HTN (hypertension)  Severe sepsis secondary to flu-like illness -Symptoms of generalize body aches, joint pains, weakness, decreased appetite, cough with yellow sputum,with lactic acidosis of 4.68 and WBC 19.6on admission  -CXR negative for pneumonia  -Influenza screen was negative, however this could represent false negative. Will treat with tamiflu regardless  -Blood culture negative at 2 days  Hypovolemic hyponatremia -Urine studies consistent with extrarenal hypovolemic hyponatremia  -Hold chlorthalidone. Hyponatremia improved with IV fluids. Will hold chlorthalidone at time of discharge. Patient should have a repeat BMP with PCP prior to resuming this medication  Hypokalemia -Replace and trend   AKI on CKD stage 3, s/p left nephrectomy due to [place]  -Baseline Cr 1.5, Cr is 2 on admission -UA unremarkable, WBC 6-30, rate bacteria, trace leuk, negative nitrite. Urine culture was negative -Suspect this is due to dehydration as well as poor PO intake -Hold chlorthalidone  -Improved with IVF, creatinine is back to baseline at time of discharge  Post-op fluid collection  after ventral hernia repair  12/23/16 -CCS consulted, likely a benign post-op finding  -Monitor for now   Urinary retention -Developed after surgery, patient was discharged home with foley in place -Will need to follow up with his urologist as outpatient   Hypothyroidism -Continue synthroid  HTN -Will hold chlorthalidone in setting of AKI and hyponatremia  -Will hold atenolol due to low BP , as well as bradycardia, heart rate of 57   Discharge Instructions  Discharge Instructions    Call MD for:  difficulty breathing, headache or visual disturbances    Complete by:  As directed    Call MD for:  extreme fatigue    Complete by:  As directed    Call MD for:  persistant dizziness or light-headedness    Complete by:  As directed    Call MD for:  temperature >100.4    Complete by:  As directed    Diet - low sodium heart healthy    Complete by:  As directed    Increase activity slowly    Complete by:  As directed      Allergies as of 12/27/2016      Reactions   Tamsulosin Nausea And Vomiting, Other (See Comments)   Uncomfortable in pelvic region   Sulfa Antibiotics Rash      Medication List    STOP taking these medications   atenolol-chlorthalidone 50-25 MG tablet Commonly known as:  TENORETIC   KLOR-CON M20 20 MEQ tablet Generic drug:  potassium chloride SA     TAKE these medications   atorvastatin 20 MG tablet Commonly known as:  LIPITOR Take 20 mg by mouth at bedtime.   CoQ10 100 MG Caps Take 1 capsule by mouth daily.   docusate sodium 100 MG capsule Commonly known as:  COLACE Take 1 capsule (100 mg total) by mouth daily. Start taking on:  12/28/2016   Fish Oil 1200 MG Caps Take 2 capsules by mouth 2 (two) times daily.   lansoprazole 15 MG capsule Commonly known as:  PREVACID Take 15 mg by mouth at bedtime.   oseltamivir 30 MG capsule Commonly known as:  TAMIFLU Take 1 capsule (30 mg total) by mouth 2 (two) times daily.   OVER THE COUNTER MEDICATION Take 1 tablet by mouth  daily. Cognium - Brain Vitamin   oxyCODONE-acetaminophen 5-325 MG tablet Commonly known as:  ROXICET Take 1-2 tablets by mouth every 4 (four) hours as needed.   PARoxetine 25 MG 24 hr tablet Commonly known as:  PAXIL-CR Take 25 mg by mouth daily.   phenol 1.4 % Liqd Commonly known as:  CHLORASEPTIC Use as directed 1 spray in the mouth or throat as needed for throat irritation / pain.   polyethylene glycol packet Commonly known as:  MIRALAX / GLYCOLAX Take 17 g by mouth daily. Start taking on:  12/28/2016   PRESERVISION AREDS PO Take 1 capsule by mouth daily.   SYNTHROID 125 MCG tablet Generic drug:  levothyroxine Take 125 mcg by mouth daily.      Follow-up Information    Dwan Bolt, MD. Schedule an appointment as soon as possible for a visit in 1 week(s).   Specialty:  Endocrinology Contact information: [address] [place] [address] [phone]          Allergies  Allergen Reactions  . Tamsulosin Nausea And Vomiting and Other (See Comments)    Uncomfortable in pelvic region  . Sulfa Antibiotics Rash    Consultations:  CCS  Procedures/Studies: Ct Abdomen Pelvis Wo Contrast  Result Date: 12/25/2016 CLINICAL DATA:  Generalized body aches.  Abdominal pain. EXAM: CT ABDOMEN AND PELVIS WITHOUT CONTRAST TECHNIQUE: Multidetector CT imaging of the abdomen and pelvis was performed following the standard protocol without IV contrast. COMPARISON:  11/29/2016 FINDINGS: Lower chest: Dependent opacities in the lung bases, likely atelectasis. Heart is normal size. Coronary artery calcifications in the left main, left anterior and left circumflex coronary arteries. Trace bilateral effusions. Hepatobiliary: No focal hepatic abnormality. Gallbladder unremarkable. Pancreas: No focal abnormality or ductal dilatation. Spleen: Mild splenomegaly with a craniocaudal length of the spleen measuring 14.4 cm. Findings are stable. Adrenals/Urinary Tract: Left  kidney is absent. Multiple calcifications noted along the posterior bladder wall. The bladder wall appears thickened. Foley catheter is in place within the bladder which is decompressed. Punctate calcification near the right UVJ could be knee right ureteral stone. No hydronephrosis on the right. No renal stones. Adrenal glands are unremarkable. Stomach/Bowel: Scattered left colonic diverticulosis. No active diverticulitis. No evidence of bowel obstruction. Vascular/Lymphatic: Aortic and iliac calcifications. No aneurysm or adenopathy. Reproductive: Prostate enlargement and calcifications. Other: Trace free fluid in the pelvis. Fluid is noted in the midline of the anterior abdominal wall, in the area of prior hernia, likely postoperative fluid collection. This measures 3.8 x 3.6 cm. Stranding noted within the subcutaneous soft tissues of the anterior abdominal wall, likely postoperative. A few locules of gas in the subcutaneous soft tissues, likely postoperative. Musculoskeletal: No acute bony abnormality. IMPRESSION: Postoperative changes from ventral hernia repair. Fluid collection at the site of prior ventral hernia measures 3.8 x 3.6 cm. This could represent postoperative seroma, hematoma or abscess. Trace bilateral pleural effusions. Bibasilar dependent opacities, likely atelectasis. Stable splenomegaly. Prostate enlargement with calcifications along the posterior bladder wall, most of which are likely in the collapsed lumen although at least 1 right posterior calcification on image 85 could be within the wall of the bladder. Also, an adjacent calcification is near the right ureterovesical junction. No hydronephrosis. Scattered colonic diverticulosis.  No active diverticulitis. Electronically Signed   By: Rolm Baptise M.D.   On: 12/25/2016 14:31   Ct Abdomen Pelvis Wo Contrast  Result Date: 11/29/2016 CLINICAL DATA:  Umbilical hernia. History of nephrectomy for left-sided renal carcinoma. EXAM: CT ABDOMEN  AND PELVIS WITHOUT CONTRAST TECHNIQUE: Multidetector CT imaging of the abdomen and pelvis was performed following the standard protocol without IV contrast. Oral contrast was administered. COMPARISON:  CT abdomen and pelvis February 25, 2016; CT abdomen Mar 18, 2016 FINDINGS: Lower chest: There is bibasilar atelectatic change. There is a stable 4 mm nodular opacity in the lateral segment of the left lower lobe seen on axial slice 14 series 4. There is a small hiatal hernia. Hepatobiliary: No focal liver lesions are evident on this noncontrast enhanced study. There is a questionable tiny gallstone within the gallbladder. The gallbladder wall is not thickened. There is a calcification in the fat lateral to the dome of the liver, likely residua of previous trauma. This focus is stable compared to prior studies and benign in appearance. There is no biliary duct dilatation. Pancreas: No pancreatic mass or inflammatory focus. Spleen: Spleen is prominent measuring 14.7 x 13.2 x 9.2 cm with a measured splenic volume of 893 cubic cm. No focal splenic lesions are evident on this noncontrast enhanced study. There is no perisplenic fluid. Adrenals/Urinary Tract: Adrenals appear normal bilaterally. Left kidney is absent. No lesion is seen in the left pararenal fossa. There is no right  renal mass or hydronephrosis. There is no right renal calculus or ureterectasis. The urinary bladder is midline. There are calcifications within the posterior urinary bladder consistent with small calculi. Urinary bladder wall is not appreciably thickened. Stomach/Bowel: There are multiple sigmoid and descending colonic diverticula without diverticulitis. There is no appreciable bowel wall or mesenteric thickening. There is a loop of small bowel extending into a ventral hernia. There is no bowel compromise in this area. No bowel obstruction. No free air or portal venous air. There is atherosclerotic calcification in the mid the distal aorta and right  common iliac artery. Aorta is somewhat ectatic but nonaneurysmal. Major mesenteric vessels appear patent on this noncontrast enhanced study. There is no evident adenopathy in the abdomen or pelvis. Reproductive: Prostate is enlarged. There are several prostatic calculi. The prostate cannot be distinguished from the inferior urinary bladder. The seminal vesicles appear unremarkable. No pelvic mass is seen apart from the prostate. No pelvic fluid collection. Other: Appendix absent. There is fat in each inguinal ring. There is a ventral hernia containing a loop of small bowel without bowel compromise. This hernia in the periumbilical region measures 4.1 cm from right to left at its neck. This hernia measures 4.3 cm from superior to inferior dimension at its neck. There is no demonstrable abscess or ascites in the abdomen or pelvis. Musculoskeletal: There is degenerative change in the lumbar spine. There is marked disc space narrowing at L2-3. There is vacuum phenomenon at L5-S1. There is mid lumbar levoscoliosis. There are no blastic or lytic bone lesions. There is no intramuscular or abdominal wall lesion. IMPRESSION: Ventral hernia containing a loop of small bowel but no bowel compromise. No bowel obstruction or bowel wall thickening. No abscess. Appendix absent. Enlarged prostate with several prostatic calculi present. Prostate cannot be separated from the inferior urinary bladder. This finding warrants direct physical examination is well as PSA evaluation. Several small calculi in the posterior urinary bladder noted. No right-sided renal or ureteral calculus. No hydronephrosis on the right. Left kidney and ureter are absent. No perinephric lesion on the left. Splenomegaly. No focal splenic lesions evident on this noncontrast enhanced study. Multiple colonic diverticula without diverticulitis. Question tiny gallstone. Gallbladder wall does not appear thickened. Small hiatal hernia. 4 mm nodular opacity lateral left  base, unchanged. Aortoiliac atherosclerosis. Electronically Signed   By: Lowella Grip III M.D.   On: 11/29/2016 13:55   Dg Chest 2 View  Result Date: 12/25/2016 CLINICAL DATA:  Short of breath.  Fall today. EXAM: CHEST  2 VIEW COMPARISON:  10/24/2016 FINDINGS: Heart size upper normal. Mild vascular congestion with interstitial edema. Small pleural effusions bilaterally. Mild bibasilar atelectasis with hypoventilation. No acute skeletal abnormality. IMPRESSION: Mild heart failure with interstitial edema and small pleural effusions. Mild bibasilar atelectasis. Electronically Signed   By: Franchot Gallo M.D.   On: 12/25/2016 14:45   Dg Knee Complete 4 Views Left  Result Date: 12/25/2016 CLINICAL DATA:  Bilateral knee pain.  Fell. EXAM: LEFT KNEE - COMPLETE 4+ VIEW; RIGHT KNEE - COMPLETE 4+ VIEW COMPARISON:  None. FINDINGS: The patellofemoral joint spaces are narrowed bilaterally. The medial and lateral compartments are fairly well maintained. There is also mild scalloping of the distal femur on lateral films. These findings can be seen with CPPD arthropathy. No obvious chondrocalcinosis. No acute fracture or osteochondral lesion. IMPRESSION: 1. No acute fracture. 2. Patellofemoral joint degenerative changes, small joint effusions and scalloping of the distal femurs most typically seen with CPPD arthropathy. No definite chondrocalcinosis.  Electronically Signed   By: Marijo Sanes M.D.   On: 12/25/2016 14:59   Dg Knee Complete 4 Views Right  Result Date: 12/25/2016 CLINICAL DATA:  Bilateral knee pain.  Fell. EXAM: LEFT KNEE - COMPLETE 4+ VIEW; RIGHT KNEE - COMPLETE 4+ VIEW COMPARISON:  None. FINDINGS: The patellofemoral joint spaces are narrowed bilaterally. The medial and lateral compartments are fairly well maintained. There is also mild scalloping of the distal femur on lateral films. These findings can be seen with CPPD arthropathy. No obvious chondrocalcinosis. No acute fracture or osteochondral  lesion. IMPRESSION: 1. No acute fracture. 2. Patellofemoral joint degenerative changes, small joint effusions and scalloping of the distal femurs most typically seen with CPPD arthropathy. No definite chondrocalcinosis. Electronically Signed   By: Marijo Sanes M.D.   On: 12/25/2016 14:59      Discharge Exam: Vitals:   12/26/16 2107 12/27/16 0514  BP: (!) 103/43 118/64  Pulse: 72 (!) 57  Resp: 18 18  Temp: 98 F (36.7 C) 98.6 F (37 C)   Vitals:   12/26/16 0515 12/26/16 1402 12/26/16 2107 12/27/16 0514  BP: 107/60 (!) 112/51 (!) 103/43 118/64  Pulse: (!) 117 73 72 (!) 57  Resp: 18 18 18 18   Temp: 98.1 F (36.7 C) 99.2 F (37.3 C) 98 F (36.7 C) 98.6 F (37 C)  TempSrc: Oral Oral Oral Oral  SpO2: 95% 96% 97% 97%  Weight:      Height:        General: Pt is alert, awake, not in acute distress Cardiovascular: RRR, S1/S2 +, no rubs, no gallops Respiratory: CTA bilaterally, no wheezing, no rhonchi Abdominal: Soft, NT, ND, bowel sounds + Extremities: no edema, no cyanosis    The results of significant diagnostics from this hospitalization (including imaging, microbiology, ancillary and laboratory) are listed below for reference.     Microbiology: Recent Results (from the past 240 hour(s))  Blood culture (routine x 2)     Status: None (Preliminary result)   Collection Time: 12/25/16 12:10 PM  Result Value Ref Range Status   Specimen Description BLOOD RIGHT ANTECUBITAL  Final   Special Requests BOTTLES DRAWN AEROBIC ONLY 10CC  Final   Culture NO GROWTH 2 DAYS  Final   Report Status PENDING  Incomplete  Blood culture (routine x 2)     Status: None (Preliminary result)   Collection Time: 12/25/16 12:20 PM  Result Value Ref Range Status   Specimen Description BLOOD RIGHT HAND  Final   Special Requests BOTTLES DRAWN AEROBIC ONLY  10CC  Final   Culture NO GROWTH 2 DAYS  Final   Report Status PENDING  Incomplete  Urine culture     Status: None   Collection Time: 12/25/16  12:32 PM  Result Value Ref Range Status   Specimen Description URINE, RANDOM  Final   Special Requests NONE  Final   Culture NO GROWTH  Final   Report Status 12/26/2016 FINAL  Final     Labs: BNP (last 3 results) No results for input(s): BNP in the last 8760 hours. Basic Metabolic Panel:  Recent Labs Lab 12/25/16 1129 12/25/16 1857 12/26/16 0010 12/26/16 0555 12/27/16 0411  NA 123* 126* 126* 131* 134*  K 3.6 3.8 3.8 3.4* 3.3*  CL 89* 96* 97* 100* 104  CO2 18* 21* 20* 20* 21*  GLUCOSE 141* 121* 110* 106* 111*  BUN 31* 28* 28* 27* 26*  CREATININE 2.00* 1.77* 1.78* 1.80* 1.52*  CALCIUM 8.3* 7.5* 7.5* 7.7* 7.9*  Liver Function Tests:  Recent Labs Lab 12/25/16 1129  AST 30  ALT 13*  ALKPHOS 47  BILITOT 2.4*  PROT 5.9*  ALBUMIN 3.3*   No results for input(s): LIPASE, AMYLASE in the last 168 hours. No results for input(s): AMMONIA in the last 168 hours. CBC:  Recent Labs Lab 12/25/16 1129 12/25/16 1857 12/26/16 0555 12/27/16 0411  WBC 19.6* 12.3* 11.4* 9.1  NEUTROABS 18.5*  --   --  7.8*  HGB 12.1* 11.2* 11.0* 11.0*  HCT 34.7* 31.8* 32.8* 32.3*  MCV 86.8 86.9 88.6 89.0  PLT 122* PLATELET CLUMPS NOTED ON SMEAR, COUNT APPEARS DECREASED 93* 109*   Cardiac Enzymes:  Recent Labs Lab 12/25/16 1339  TROPONINI <0.03   BNP: Invalid input(s): POCBNP CBG: No results for input(s): GLUCAP in the last 168 hours. D-Dimer No results for input(s): DDIMER in the last 72 hours. Hgb A1c No results for input(s): HGBA1C in the last 72 hours. Lipid Profile No results for input(s): CHOL, HDL, LDLCALC, TRIG, CHOLHDL, LDLDIRECT in the last 72 hours. Thyroid function studies No results for input(s): TSH, T4TOTAL, T3FREE, THYROIDAB in the last 72 hours.  Invalid input(s): FREET3 Anemia work up No results for input(s): VITAMINB12, FOLATE, FERRITIN, TIBC, IRON, RETICCTPCT in the last 72 hours. Urinalysis    Component Value Date/Time   COLORURINE YELLOW 12/25/2016 1232    APPEARANCEUR CLEAR 12/25/2016 1232   LABSPEC 1.024 12/25/2016 1232   PHURINE 5.0 12/25/2016 1232   GLUCOSEU NEGATIVE 12/25/2016 1232   HGBUR MODERATE (A) 12/25/2016 1232   BILIRUBINUR NEGATIVE 12/25/2016 1232   KETONESUR NEGATIVE 12/25/2016 1232   PROTEINUR 30 (A) 12/25/2016 1232   NITRITE NEGATIVE 12/25/2016 1232   LEUKOCYTESUR TRACE (A) 12/25/2016 1232   Sepsis Labs Invalid input(s): PROCALCITONIN,  WBC,  LACTICIDVEN Microbiology Recent Results (from the past 240 hour(s))  Blood culture (routine x 2)     Status: None (Preliminary result)   Collection Time: 12/25/16 12:10 PM  Result Value Ref Range Status   Specimen Description BLOOD RIGHT ANTECUBITAL  Final   Special Requests BOTTLES DRAWN AEROBIC ONLY 10CC  Final   Culture NO GROWTH 2 DAYS  Final   Report Status PENDING  Incomplete  Blood culture (routine x 2)     Status: None (Preliminary result)   Collection Time: 12/25/16 12:20 PM  Result Value Ref Range Status   Specimen Description BLOOD RIGHT HAND  Final   Special Requests BOTTLES DRAWN AEROBIC ONLY  10CC  Final   Culture NO GROWTH 2 DAYS  Final   Report Status PENDING  Incomplete  Urine culture     Status: None   Collection Time: 12/25/16 12:32 PM  Result Value Ref Range Status   Specimen Description URINE, RANDOM  Final   Special Requests NONE  Final   Culture NO GROWTH  Final   Report Status 12/26/2016 FINAL  Final     Time coordinating discharge: 35 minutes  SIGNED:  Dessa Phi, DO [facility] Pager [phone]  If 7PM-7AM, please contact night-coverage [email or web address] Password [email or web address] 12/27/2016, 2:19 PM

## 2016-12-27 NOTE — Progress Notes (Deleted)
Camelia Eng to be D/C'd Skilled nursing facility per MD order.  Discussed with the patient and all questions fully answered.  VSS, Skin clean, dry and intact without evidence of skin break down, no evidence of skin tears noted. IV catheter discontinued intact. Site without signs and symptoms of complications. Dressing and pressure applied.  An After Visit Summary was printed and given to the patient. Patient received prescription.  D/c education completed with patient/family including follow up instructions, medication list, d/c activities limitations if indicated, with other d/c instructions as indicated by MD - patient able to verbalize understanding, all questions fully answered.   Patient instructed to return to ED, call 911, or call MD for any changes in condition.   Patient escorted via [place], and D/C home via private auto.  [name] 12/27/2016 3:58 PM

## 2016-12-30 DIAGNOSIS — R338 Other retention of urine: Secondary | ICD-10-CM | POA: Diagnosis not present

## 2016-12-30 DIAGNOSIS — N400 Enlarged prostate without lower urinary tract symptoms: Secondary | ICD-10-CM | POA: Diagnosis not present

## 2016-12-30 LAB — CULTURE, BLOOD (ROUTINE X 2)
Culture: NO GROWTH
Culture: NO GROWTH

## 2016-12-31 DIAGNOSIS — E876 Hypokalemia: Secondary | ICD-10-CM | POA: Diagnosis not present

## 2016-12-31 DIAGNOSIS — Z09 Encounter for follow-up examination after completed treatment for conditions other than malignant neoplasm: Secondary | ICD-10-CM | POA: Diagnosis not present

## 2017-01-03 DIAGNOSIS — R7989 Other specified abnormal findings of blood chemistry: Secondary | ICD-10-CM | POA: Diagnosis not present

## 2017-01-03 DIAGNOSIS — E876 Hypokalemia: Secondary | ICD-10-CM | POA: Diagnosis not present

## 2017-01-07 DIAGNOSIS — R109 Unspecified abdominal pain: Secondary | ICD-10-CM | POA: Diagnosis not present

## 2017-01-07 DIAGNOSIS — E032 Hypothyroidism due to medicaments and other exogenous substances: Secondary | ICD-10-CM | POA: Diagnosis not present

## 2017-01-07 DIAGNOSIS — I10 Essential (primary) hypertension: Secondary | ICD-10-CM | POA: Diagnosis not present

## 2017-01-10 ENCOUNTER — Emergency Department (HOSPITAL_COMMUNITY): Payer: PPO

## 2017-01-10 ENCOUNTER — Observation Stay (HOSPITAL_COMMUNITY): Payer: PPO | Admitting: Anesthesiology

## 2017-01-10 ENCOUNTER — Inpatient Hospital Stay (HOSPITAL_COMMUNITY)
Admission: EM | Admit: 2017-01-10 | Discharge: 2017-01-13 | DRG: 354 | Disposition: A | Discharge status: Home / Self Care | Payer: PPO | Attending: Internal Medicine | Admitting: Internal Medicine

## 2017-01-10 ENCOUNTER — Encounter (HOSPITAL_COMMUNITY)
Admission: EM | Disposition: A | Discharge status: Home / Self Care | Payer: Self-pay | Source: Home / Self Care | Attending: Internal Medicine

## 2017-01-10 ENCOUNTER — Encounter (HOSPITAL_COMMUNITY): Payer: Self-pay | Admitting: Emergency Medicine

## 2017-01-10 DIAGNOSIS — E039 Hypothyroidism, unspecified: Secondary | ICD-10-CM | POA: Diagnosis present

## 2017-01-10 DIAGNOSIS — K219 Gastro-esophageal reflux disease without esophagitis: Secondary | ICD-10-CM | POA: Diagnosis not present

## 2017-01-10 DIAGNOSIS — H269 Unspecified cataract: Secondary | ICD-10-CM | POA: Diagnosis not present

## 2017-01-10 DIAGNOSIS — I159 Secondary hypertension, unspecified: Secondary | ICD-10-CM | POA: Diagnosis not present

## 2017-01-10 DIAGNOSIS — K56609 Unspecified intestinal obstruction, unspecified as to partial versus complete obstruction: Secondary | ICD-10-CM | POA: Diagnosis present

## 2017-01-10 DIAGNOSIS — K5900 Constipation, unspecified: Secondary | ICD-10-CM | POA: Diagnosis not present

## 2017-01-10 DIAGNOSIS — E785 Hyperlipidemia, unspecified: Secondary | ICD-10-CM | POA: Diagnosis not present

## 2017-01-10 DIAGNOSIS — K913 Postprocedural intestinal obstruction, unspecified as to partial versus complete: Secondary | ICD-10-CM | POA: Diagnosis not present

## 2017-01-10 DIAGNOSIS — N183 Chronic kidney disease, stage 3 unspecified: Secondary | ICD-10-CM | POA: Diagnosis present

## 2017-01-10 DIAGNOSIS — R109 Unspecified abdominal pain: Secondary | ICD-10-CM | POA: Diagnosis not present

## 2017-01-10 DIAGNOSIS — H3530 Unspecified macular degeneration: Secondary | ICD-10-CM | POA: Diagnosis present

## 2017-01-10 DIAGNOSIS — K469 Unspecified abdominal hernia without obstruction or gangrene: Secondary | ICD-10-CM | POA: Diagnosis not present

## 2017-01-10 DIAGNOSIS — I129 Hypertensive chronic kidney disease with stage 1 through stage 4 chronic kidney disease, or unspecified chronic kidney disease: Secondary | ICD-10-CM | POA: Diagnosis present

## 2017-01-10 DIAGNOSIS — Z79899 Other long term (current) drug therapy: Secondary | ICD-10-CM | POA: Diagnosis not present

## 2017-01-10 DIAGNOSIS — R1084 Generalized abdominal pain: Secondary | ICD-10-CM | POA: Diagnosis not present

## 2017-01-10 DIAGNOSIS — K56699 Other intestinal obstruction unspecified as to partial versus complete obstruction: Secondary | ICD-10-CM | POA: Diagnosis not present

## 2017-01-10 DIAGNOSIS — K5669 Other partial intestinal obstruction: Secondary | ICD-10-CM | POA: Diagnosis not present

## 2017-01-10 DIAGNOSIS — N179 Acute kidney failure, unspecified: Secondary | ICD-10-CM | POA: Diagnosis not present

## 2017-01-10 DIAGNOSIS — T819XXA Unspecified complication of procedure, initial encounter: Secondary | ICD-10-CM | POA: Diagnosis present

## 2017-01-10 DIAGNOSIS — I10 Essential (primary) hypertension: Secondary | ICD-10-CM | POA: Diagnosis present

## 2017-01-10 DIAGNOSIS — Z888 Allergy status to other drugs, medicaments and biological substances status: Secondary | ICD-10-CM | POA: Diagnosis not present

## 2017-01-10 DIAGNOSIS — F419 Anxiety disorder, unspecified: Secondary | ICD-10-CM | POA: Diagnosis not present

## 2017-01-10 DIAGNOSIS — K430 Incisional hernia with obstruction, without gangrene: Principal | ICD-10-CM | POA: Diagnosis present

## 2017-01-10 DIAGNOSIS — Z905 Acquired absence of kidney: Secondary | ICD-10-CM | POA: Diagnosis not present

## 2017-01-10 DIAGNOSIS — Z882 Allergy status to sulfonamides status: Secondary | ICD-10-CM

## 2017-01-10 DIAGNOSIS — K660 Peritoneal adhesions (postprocedural) (postinfection): Secondary | ICD-10-CM | POA: Diagnosis present

## 2017-01-10 HISTORY — PX: LAPAROSCOPY: SHX197

## 2017-01-10 HISTORY — PX: INSERTION OF MESH: SHX5868

## 2017-01-10 LAB — COMPREHENSIVE METABOLIC PANEL
ALBUMIN: 3.1 g/dL — AB (ref 3.5–5.0)
ALK PHOS: 85 U/L (ref 38–126)
ALT: 30 U/L (ref 17–63)
AST: 20 U/L (ref 15–41)
Anion gap: 10 (ref 5–15)
BILIRUBIN TOTAL: 1.1 mg/dL (ref 0.3–1.2)
BUN: 15 mg/dL (ref 6–20)
CALCIUM: 8.5 mg/dL — AB (ref 8.9–10.3)
CO2: 22 mmol/L (ref 22–32)
CREATININE: 1.44 mg/dL — AB (ref 0.61–1.24)
Chloride: 104 mmol/L (ref 101–111)
GFR calc non Af Amer: 47 mL/min — ABNORMAL LOW (ref >60)
GFR, EST AFRICAN AMERICAN: 55 mL/min — AB (ref >60)
GLUCOSE: 140 mg/dL — AB (ref 65–99)
Potassium: 3.9 mmol/L (ref 3.5–5.1)
SODIUM: 136 mmol/L (ref 135–145)
Total Protein: 6.2 g/dL — ABNORMAL LOW (ref 6.5–8.1)

## 2017-01-10 LAB — URINALYSIS, ROUTINE W REFLEX MICROSCOPIC
Bilirubin Urine: NEGATIVE
Glucose, UA: NEGATIVE mg/dL
HGB URINE DIPSTICK: NEGATIVE
KETONES UR: 15 mg/dL — AB
Nitrite: NEGATIVE
PROTEIN: NEGATIVE mg/dL
Specific Gravity, Urine: 1.015 (ref 1.005–1.030)
pH: 8 (ref 5.0–8.0)

## 2017-01-10 LAB — CBC
HCT: 38 % — ABNORMAL LOW (ref 39.0–52.0)
Hemoglobin: 12.7 g/dL — ABNORMAL LOW (ref 13.0–17.0)
MCH: 29.9 pg (ref 26.0–34.0)
MCHC: 33.4 g/dL (ref 30.0–36.0)
MCV: 89.4 fL (ref 78.0–100.0)
PLATELETS: 284 10*3/uL (ref 150–400)
RBC: 4.25 MIL/uL (ref 4.22–5.81)
RDW: 14.3 % (ref 11.5–15.5)
WBC: 12.3 10*3/uL — ABNORMAL HIGH (ref 4.0–10.5)

## 2017-01-10 LAB — URINALYSIS, MICROSCOPIC (REFLEX): RBC / HPF: NONE SEEN RBC/hpf (ref 0–5)

## 2017-01-10 LAB — I-STAT CG4 LACTIC ACID, ED: Lactic Acid, Venous: 1.72 mmol/L (ref 0.5–1.9)

## 2017-01-10 LAB — MAGNESIUM: MAGNESIUM: 2 mg/dL (ref 1.7–2.4)

## 2017-01-10 LAB — APTT: aPTT: 36 seconds (ref 24–36)

## 2017-01-10 LAB — PROTIME-INR
INR: 1.27
Prothrombin Time: 15.9 seconds — ABNORMAL HIGH (ref 11.4–15.2)

## 2017-01-10 LAB — LIPASE, BLOOD: Lipase: 67 U/L — ABNORMAL HIGH (ref 11–51)

## 2017-01-10 LAB — PHOSPHORUS: PHOSPHORUS: 4 mg/dL (ref 2.5–4.6)

## 2017-01-10 SURGERY — LAPAROSCOPY, DIAGNOSTIC
Anesthesia: General | Site: Abdomen

## 2017-01-10 MED ORDER — HYDROMORPHONE HCL 2 MG/ML IJ SOLN
0.5000 mg | Freq: Once | INTRAMUSCULAR | Status: AC
Start: 2017-01-10 — End: 2017-01-10
  Administered 2017-01-10: 0.5 mg via INTRAVENOUS
  Filled 2017-01-10: qty 1

## 2017-01-10 MED ORDER — ONDANSETRON HCL 4 MG PO TABS: 4.0000 mg | ORAL_TABLET | Freq: Four times a day (QID) | ORAL | Status: DC | PRN

## 2017-01-10 MED ORDER — PHENYLEPHRINE HCL 10 MG/ML IJ SOLN
INTRAMUSCULAR | Status: DC | PRN
  Administered 2017-01-10: 10 ug/min via INTRAVENOUS

## 2017-01-10 MED ORDER — CEFOTETAN DISODIUM-DEXTROSE 2-2.08 GM-% IV SOLR
INTRAVENOUS | Status: AC
  Filled 2017-01-10: qty 50

## 2017-01-10 MED ORDER — CHLORHEXIDINE GLUCONATE CLOTH 2 % EX PADS: 6.0000 | MEDICATED_PAD | Freq: Once | CUTANEOUS | Status: DC

## 2017-01-10 MED ORDER — ENOXAPARIN SODIUM 40 MG/0.4ML ~~LOC~~ SOLN
40.0000 mg | SUBCUTANEOUS | Status: DC
  Administered 2017-01-11 – 2017-01-12 (×2): 40 mg via SUBCUTANEOUS
  Filled 2017-01-10 (×2): qty 0.4

## 2017-01-10 MED ORDER — IOPAMIDOL (ISOVUE-300) INJECTION 61%
INTRAVENOUS | Status: AC
  Administered 2017-01-10: 100 mL via INTRAVENOUS
  Filled 2017-01-10: qty 100

## 2017-01-10 MED ORDER — VECURONIUM BROMIDE 10 MG IV SOLR
INTRAVENOUS | Status: DC | PRN
  Administered 2017-01-10: 6 mg via INTRAVENOUS

## 2017-01-10 MED ORDER — HYDROMORPHONE HCL 2 MG/ML IJ SOLN: 1.0000 mg | INTRAMUSCULAR | Status: DC | PRN

## 2017-01-10 MED ORDER — IOPAMIDOL (ISOVUE-300) INJECTION 61%
INTRAVENOUS | Status: AC
  Administered 2017-01-10: 30 mL
  Filled 2017-01-10: qty 50

## 2017-01-10 MED ORDER — SUCCINYLCHOLINE CHLORIDE 20 MG/ML IJ SOLN
INTRAMUSCULAR | Status: DC | PRN
  Administered 2017-01-10: 100 mg via INTRAVENOUS

## 2017-01-10 MED ORDER — SUGAMMADEX SODIUM 200 MG/2ML IV SOLN
INTRAVENOUS | Status: DC | PRN
  Administered 2017-01-10: 200 mg via INTRAVENOUS

## 2017-01-10 MED ORDER — CEFOTETAN DISODIUM 2 G IJ SOLR: 2.0000 g | INTRAMUSCULAR | Status: DC

## 2017-01-10 MED ORDER — LIDOCAINE HCL (CARDIAC) 20 MG/ML IV SOLN
INTRAVENOUS | Status: DC | PRN
  Administered 2017-01-10: 100 mg via INTRAVENOUS

## 2017-01-10 MED ORDER — ONDANSETRON HCL 4 MG/2ML IJ SOLN
INTRAMUSCULAR | Status: DC | PRN
  Administered 2017-01-10: 4 mg via INTRAVENOUS

## 2017-01-10 MED ORDER — DEXAMETHASONE SODIUM PHOSPHATE 10 MG/ML IJ SOLN
INTRAMUSCULAR | Status: DC | PRN
  Administered 2017-01-10: 10 mg via INTRAVENOUS

## 2017-01-10 MED ORDER — ONDANSETRON HCL 4 MG/2ML IJ SOLN: 4.0000 mg | Freq: Four times a day (QID) | INTRAMUSCULAR | Status: DC | PRN

## 2017-01-10 MED ORDER — FENTANYL CITRATE (PF) 100 MCG/2ML IJ SOLN
INTRAMUSCULAR | Status: DC | PRN
  Administered 2017-01-10 (×2): 100 ug via INTRAVENOUS

## 2017-01-10 MED ORDER — HYDROMORPHONE HCL 2 MG/ML IJ SOLN
1.0000 mg | INTRAMUSCULAR | Status: DC | PRN
  Administered 2017-01-10: 1 mg via INTRAVENOUS
  Filled 2017-01-10: qty 1

## 2017-01-10 MED ORDER — BUPIVACAINE HCL (PF) 0.25 % IJ SOLN
INTRAMUSCULAR | Status: AC
  Filled 2017-01-10: qty 30

## 2017-01-10 MED ORDER — METOCLOPRAMIDE HCL 5 MG/ML IJ SOLN
INTRAMUSCULAR | Status: AC
  Filled 2017-01-10: qty 2

## 2017-01-10 MED ORDER — HYDRALAZINE HCL 20 MG/ML IJ SOLN: 10.0000 mg | Freq: Three times a day (TID) | INTRAMUSCULAR | Status: DC | PRN

## 2017-01-10 MED ORDER — PROPOFOL 10 MG/ML IV BOLUS
INTRAVENOUS | Status: DC | PRN
  Administered 2017-01-10: 140 mg via INTRAVENOUS

## 2017-01-10 MED ORDER — HYDROMORPHONE HCL 1 MG/ML IJ SOLN: 0.2500 mg | INTRAMUSCULAR | Status: DC | PRN

## 2017-01-10 MED ORDER — DEXTROSE 5 % IV SOLN
2.0000 g | INTRAVENOUS | Status: AC
  Administered 2017-01-10: 2 g via INTRAVENOUS

## 2017-01-10 MED ORDER — SODIUM CHLORIDE 0.9 % IR SOLN
Status: DC | PRN
  Administered 2017-01-10: 1000 mL

## 2017-01-10 MED ORDER — DEXTROSE 5 % IV SOLN
INTRAVENOUS | Status: DC | PRN
  Administered 2017-01-10: 16:00:00 via INTRAVENOUS

## 2017-01-10 MED ORDER — BUPIVACAINE HCL (PF) 0.25 % IJ SOLN
INTRAMUSCULAR | Status: DC | PRN
  Administered 2017-01-10: 5 mL

## 2017-01-10 MED ORDER — KCL IN DEXTROSE-NACL 20-5-0.45 MEQ/L-%-% IV SOLN
INTRAVENOUS | Status: DC
  Administered 2017-01-10 – 2017-01-12 (×5): via INTRAVENOUS
  Filled 2017-01-10 (×6): qty 1000

## 2017-01-10 MED ORDER — ARTIFICIAL TEARS OP OINT
TOPICAL_OINTMENT | OPHTHALMIC | Status: DC | PRN
  Administered 2017-01-10: 1 via OPHTHALMIC

## 2017-01-10 MED ORDER — PROPOFOL 10 MG/ML IV BOLUS
INTRAVENOUS | Status: AC
  Filled 2017-01-10: qty 20

## 2017-01-10 MED ORDER — FENTANYL CITRATE (PF) 100 MCG/2ML IJ SOLN
INTRAMUSCULAR | Status: AC
  Filled 2017-01-10: qty 4

## 2017-01-10 MED ORDER — KETOROLAC TROMETHAMINE 15 MG/ML IJ SOLN: 15.0000 mg | Freq: Four times a day (QID) | INTRAMUSCULAR | Status: DC | PRN

## 2017-01-10 MED ORDER — PANTOPRAZOLE SODIUM 40 MG IV SOLR
40.0000 mg | Freq: Two times a day (BID) | INTRAVENOUS | Status: DC
  Administered 2017-01-10 – 2017-01-12 (×6): 40 mg via INTRAVENOUS
  Filled 2017-01-10 (×7): qty 40

## 2017-01-10 MED ORDER — ONDANSETRON 4 MG PO TBDP: 4.0000 mg | ORAL_TABLET | Freq: Four times a day (QID) | ORAL | Status: DC | PRN

## 2017-01-10 MED ORDER — SUGAMMADEX SODIUM 200 MG/2ML IV SOLN
INTRAVENOUS | Status: AC
  Filled 2017-01-10: qty 2

## 2017-01-10 MED ORDER — HYDROMORPHONE HCL 2 MG/ML IJ SOLN
0.5000 mg | Freq: Once | INTRAMUSCULAR | Status: AC
  Administered 2017-01-10: 0.5 mg via INTRAVENOUS
  Filled 2017-01-10: qty 1

## 2017-01-10 MED ORDER — ENOXAPARIN SODIUM 30 MG/0.3ML ~~LOC~~ SOLN: 30.0000 mg | SUBCUTANEOUS | Status: DC

## 2017-01-10 MED ORDER — ONDANSETRON HCL 4 MG/2ML IJ SOLN
4.0000 mg | Freq: Once | INTRAMUSCULAR | Status: AC
  Administered 2017-01-10: 4 mg via INTRAVENOUS
  Filled 2017-01-10: qty 2

## 2017-01-10 MED ORDER — LACTATED RINGERS IV SOLN
INTRAVENOUS | Status: DC | PRN
  Administered 2017-01-10: 15:00:00 via INTRAVENOUS

## 2017-01-10 MED ORDER — 0.9 % SODIUM CHLORIDE (POUR BTL) OPTIME
TOPICAL | Status: DC | PRN
  Administered 2017-01-10: 1000 mL

## 2017-01-10 MED ORDER — METOCLOPRAMIDE HCL 5 MG/ML IJ SOLN
INTRAMUSCULAR | Status: DC | PRN
  Administered 2017-01-10: 10 mg via INTRAVENOUS

## 2017-01-10 MED ORDER — DEXTROSE-NACL 5-0.9 % IV SOLN
Freq: Once | INTRAVENOUS | Status: AC
  Administered 2017-01-10: 150 mL/h via INTRAVENOUS

## 2017-01-10 MED ORDER — HYDROMORPHONE HCL 2 MG/ML IJ SOLN: 1.0000 mg | Freq: Three times a day (TID) | INTRAMUSCULAR | Status: DC | PRN

## 2017-01-10 MED ORDER — DEXAMETHASONE SODIUM PHOSPHATE 10 MG/ML IJ SOLN
INTRAMUSCULAR | Status: AC
  Filled 2017-01-10: qty 1

## 2017-01-10 MED ORDER — ONDANSETRON HCL 4 MG/2ML IJ SOLN
INTRAMUSCULAR | Status: AC
  Filled 2017-01-10: qty 12

## 2017-01-10 SURGICAL SUPPLY — 76 items
ADH SKN CLS APL DERMABOND .7 (GAUZE/BANDAGES/DRESSINGS) ×2
APL SKNCLS STERI-STRIP NONHPOA (GAUZE/BANDAGES/DRESSINGS) ×2
APPLIER CLIP 5 13 M/L LIGAMAX5 (MISCELLANEOUS)
APR CLP MED LRG 5 ANG JAW (MISCELLANEOUS)
BENZOIN TINCTURE PRP APPL 2/3 (GAUZE/BANDAGES/DRESSINGS) ×2 IMPLANT
BIOPATCH RED 1 DISK 7.0 (GAUZE/BANDAGES/DRESSINGS) ×2 IMPLANT
BLADE CLIPPER SURG (BLADE) IMPLANT
CANISTER SUCT 3000ML PPV (MISCELLANEOUS) ×3 IMPLANT
CHLORAPREP W/TINT 26ML (MISCELLANEOUS) ×3 IMPLANT
CLIP APPLIE 5 13 M/L LIGAMAX5 (MISCELLANEOUS) IMPLANT
COVER SURGICAL LIGHT HANDLE (MISCELLANEOUS) ×3 IMPLANT
DECANTER SPIKE VIAL GLASS SM (MISCELLANEOUS) ×5 IMPLANT
DERMABOND ADVANCED (GAUZE/BANDAGES/DRESSINGS) ×1
DERMABOND ADVANCED .7 DNX12 (GAUZE/BANDAGES/DRESSINGS) ×2 IMPLANT
DEVICE SECURE STRAP 25 ABSORB (INSTRUMENTS) ×2 IMPLANT
DRAIN CHANNEL 19F RND (DRAIN) ×2 IMPLANT
DRAPE C-ARM 42X72 X-RAY (DRAPES) IMPLANT
DRAPE LAPAROSCOPIC ABDOMINAL (DRAPES) ×3 IMPLANT
DRAPE WARM FLUID 44X44 (DRAPE) ×3 IMPLANT
DRSG OPSITE POSTOP 4X10 (GAUZE/BANDAGES/DRESSINGS) IMPLANT
DRSG OPSITE POSTOP 4X8 (GAUZE/BANDAGES/DRESSINGS) IMPLANT
DRSG TEGADERM 2-3/8X2-3/4 SM (GAUZE/BANDAGES/DRESSINGS) ×2 IMPLANT
ELECT BLADE 6.5 EXT (BLADE) IMPLANT
ELECT CAUTERY BLADE 6.4 (BLADE) ×3 IMPLANT
ELECT REM PT RETURN 9FT ADLT (ELECTROSURGICAL) ×3
ELECTRODE REM PT RTRN 9FT ADLT (ELECTROSURGICAL) ×2 IMPLANT
EVACUATOR SILICONE 100CC (DRAIN) ×2 IMPLANT
GAUZE SPONGE 2X2 8PLY STRL LF (GAUZE/BANDAGES/DRESSINGS) ×1 IMPLANT
GLOVE SURG SIGNA 7.5 PF LTX (GLOVE) ×3 IMPLANT
GOWN STRL REUS W/ TWL LRG LVL3 (GOWN DISPOSABLE) ×4 IMPLANT
GOWN STRL REUS W/ TWL XL LVL3 (GOWN DISPOSABLE) ×2 IMPLANT
GOWN STRL REUS W/TWL LRG LVL3 (GOWN DISPOSABLE) ×6
GOWN STRL REUS W/TWL XL LVL3 (GOWN DISPOSABLE) ×3
KIT BASIN OR (CUSTOM PROCEDURE TRAY) ×3 IMPLANT
KIT ROOM TURNOVER OR (KITS) ×3 IMPLANT
LIGASURE IMPACT 36 18CM CVD LR (INSTRUMENTS) IMPLANT
MESH VICRYL KNITTED 12X12 (Mesh General) ×2 IMPLANT
NDL INSUFFLATION 14GA 120MM (NEEDLE) IMPLANT
NEEDLE INSUFFLATION 14GA 120MM (NEEDLE) ×3 IMPLANT
NS IRRIG 1000ML POUR BTL (IV SOLUTION) ×6 IMPLANT
PACK GENERAL/GYN (CUSTOM PROCEDURE TRAY) ×3 IMPLANT
PAD ARMBOARD 7.5X6 YLW CONV (MISCELLANEOUS) ×3 IMPLANT
RELOAD STAPLE 4.0 BLU F/HERNIA (INSTRUMENTS) IMPLANT
RELOAD STAPLE HERNIA 4.0 BLUE (INSTRUMENTS) ×6 IMPLANT
SCISSORS LAP 5X35 DISP (ENDOMECHANICALS) IMPLANT
SET IRRIG TUBING LAPAROSCOPIC (IRRIGATION / IRRIGATOR) ×2 IMPLANT
SLEEVE ENDOPATH XCEL 5M (ENDOMECHANICALS) ×5 IMPLANT
SPECIMEN JAR LARGE (MISCELLANEOUS) IMPLANT
SPONGE GAUZE 2X2 STER 10/PKG (GAUZE/BANDAGES/DRESSINGS) ×1
SPONGE LAP 18X18 X RAY DECT (DISPOSABLE) IMPLANT
STAPLER HERNIA 12 8.5 360D (INSTRUMENTS) ×2 IMPLANT
STAPLER VISISTAT 35W (STAPLE) ×3 IMPLANT
STRIP CLOSURE SKIN 1/2X4 (GAUZE/BANDAGES/DRESSINGS) ×2 IMPLANT
SUCTION POOLE TIP (SUCTIONS) ×3 IMPLANT
SUT ETHILON 2 0 FS 18 (SUTURE) ×2 IMPLANT
SUT MNCRL AB 4-0 PS2 18 (SUTURE) ×2 IMPLANT
SUT MON AB 4-0 PC3 18 (SUTURE) ×3 IMPLANT
SUT PDS AB 1 TP1 96 (SUTURE) ×6 IMPLANT
SUT SILK 2 0 (SUTURE) ×3
SUT SILK 2 0 SH (SUTURE) ×2 IMPLANT
SUT SILK 2 0 SH CR/8 (SUTURE) ×3 IMPLANT
SUT SILK 2 0 TIES 10X30 (SUTURE) ×3 IMPLANT
SUT SILK 2-0 18XBRD TIE 12 (SUTURE) ×1 IMPLANT
SUT SILK 3 0 (SUTURE) ×3
SUT SILK 3 0 SH CR/8 (SUTURE) ×3 IMPLANT
SUT SILK 3 0 TIES 10X30 (SUTURE) ×3 IMPLANT
SUT SILK 3-0 18XBRD TIE 12 (SUTURE) ×1 IMPLANT
SUT VIC AB 3-0 SH 18 (SUTURE) IMPLANT
TOWEL OR 17X24 6PK STRL BLUE (TOWEL DISPOSABLE) ×3 IMPLANT
TOWEL OR 17X26 10 PK STRL BLUE (TOWEL DISPOSABLE) ×3 IMPLANT
TRAY FOLEY W/METER SILVER 16FR (SET/KITS/TRAYS/PACK) IMPLANT
TRAY LAPAROSCOPIC MC (CUSTOM PROCEDURE TRAY) ×3 IMPLANT
TROCAR XCEL NON-BLD 11X100MML (ENDOMECHANICALS) IMPLANT
TROCAR XCEL NON-BLD 5MMX100MML (ENDOMECHANICALS) ×3 IMPLANT
TUBING INSUFFLATION (TUBING) ×3 IMPLANT
YANKAUER SUCT BULB TIP NO VENT (SUCTIONS) IMPLANT

## 2017-01-10 NOTE — H&P (Signed)
[facility] History and Physical  Eric Suarez [id number] DOB: 1946-09-19 DOA: 01/10/2017  Referring physician:  PCP: Dwan Bolt, MD   Chief Complaint: "The pain got so bad I came to the ER."  HPI: Eric Suarez is a 71 y.o. male  the past medical history significant for anxiety, cancer, coronary artery disease, reflux, hypothyroidism, hypertension who presented to the emergency room with abdominal pain. Of note patient had ventral hernia repair in February. Patient states about 2 weeks after the surgery he began to have abdominal discomfort. He describes the discomfort as a strong ache. Began acutely worse yesterday. Patient has some nausea but no vomiting. At its worst the pain was rated as greater than 10 out of 10. Patient denies any abdominal trauma. Patient's last bowel movement was the day before admission with the addition of enema. Patient has not passed any flatus in the last 12 hours. Patient has no appetite.  ED course: CT scan showed internal hernia and bowel obstruction. Patient seen by surgery who will do operative repair later today.   Review of Systems:  As per HPI otherwise 10 point review of systems negative.    Past Medical History:  Diagnosis Date  . Anxiety   . Arthritis   . Cancer [facility]) [id number]   thryoid, left kidney  . Cataracts, bilateral   . Complication of anesthesia    unable to void after surgery  . Coronary artery disease    pt denies- had stress>10 yrs ago due to"get winded walking uphill'- neg  . GERD (gastroesophageal reflux disease)   . History of hiatal hernia   . History of kidney surgery    left kidney removed due to cancer 2017  . Hypertension   . Hypothyroidism   . Left renal mass   . Macular degeneration of both eyes    dry  . PONV (postoperative nausea and vomiting)    Past Surgical History:  Procedure Laterality Date  . APPENDECTOMY    . CYSTOSCOPY WITH RETROGRADE PYELOGRAM, URETEROSCOPY AND STENT  PLACEMENT Left 04/01/2016   Procedure: CYSTOSCOPY WITH RETROGRADE PYELOGRAM, left ureter;  Surgeon: Raynelle Bring, MD;  Location: [facility];  Service: Urology;  Laterality: Left;  . HEMORRHOID SURGERY    . HERNIA REPAIR     inguinal-3 on left , 1 on right  . INCISIONAL HERNIA REPAIR N/A 12/23/2016   Procedure: LAPAROSCOPIC VERSUS OPEN LYSIS OF ADHESIONS AND INCISIONAL HERNIA REPAIR WITH MESH;  Surgeon: Ralene Ok, MD;  Location: [place];  Service: General;  Laterality: N/A;  . INSERTION OF MESH N/A 12/23/2016   Procedure: INSERTION OF MESH;  Surgeon: Ralene Ok, MD;  Location: [place];  Service: General;  Laterality: N/A;  . LAPAROSCOPIC NEPHRECTOMY Left 04/18/2016   Procedure: LAPAROSCOPIC RADICAL  LEFT NEPHRECTOMY;  Surgeon: Raynelle Bring, MD;  Location: [facility];  Service: Urology;  Laterality: Left;  . NASAL SINUS SURGERY     left side  . THYROIDECTOMY, PARTIAL     left small amount on left side-for cancer  . TONSILLECTOMY     Social History:  reports that he has never smoked. He has never used smokeless tobacco. He reports that he does not drink alcohol or use drugs.  Allergies  Allergen Reactions  . Tamsulosin Nausea And Vomiting and Other (See Comments)    Uncomfortable in pelvic region  . Sulfa Antibiotics Rash    No family history on file.   Prior to Admission medications   Medication Sig Start Date End Date  Taking? Authorizing Provider  alfuzosin (UROXATRAL) 10 MG 24 hr tablet Take 5 mg by mouth daily.  12/30/16  Yes Historical Provider, MD  atorvastatin (LIPITOR) 20 MG tablet Take 20 mg by mouth at bedtime.   Yes Historical Provider, MD  Coenzyme Q10 (COQ10) 100 MG CAPS Take 1 capsule by mouth daily.   Yes Historical Provider, MD  lansoprazole (PREVACID) 15 MG capsule Take 15 mg by mouth at bedtime.   Yes Historical Provider, MD  Multiple Vitamins-Minerals (PRESERVISION AREDS PO) Take 1 capsule by mouth daily.   Yes Historical Provider, MD  Omega-3 Fatty Acids (FISH OIL) 1200  MG CAPS Take 2 capsules by mouth 2 (two) times daily.   Yes Historical Provider, MD  OVER THE COUNTER MEDICATION Take 1 tablet by mouth daily. Cognium - Brain Vitamin   Yes Historical Provider, MD  PARoxetine (PAXIL-CR) 25 MG 24 hr tablet Take 25 mg by mouth daily. 02/21/16  Yes Historical Provider, MD  phenol (CHLORASEPTIC) 1.4 % LIQD Use as directed 1 spray in the mouth or throat as needed for throat irritation / pain.   Yes Historical Provider, MD  potassium chloride SA (K-DUR,KLOR-CON) 20 MEQ tablet Take 20 mEq by mouth daily.   Yes Historical Provider, MD  SYNTHROID 125 MCG tablet Take 125 mcg by mouth daily.  01/10/16  Yes Historical Provider, MD   Physical Exam: Vitals:   01/10/17 0815 01/10/17 0845 01/10/17 0900 01/10/17 0915  BP: 122/73 122/79 114/80 119/71  Pulse: 63 67 69 69  Resp:      Temp:      TempSrc:      SpO2: 97% 96% 97% 96%  Weight:      Height:        Wt Readings from Last 3 Encounters:  01/10/17 93 kg (205 lb)  12/25/16 93 kg (205 lb)  12/17/16 95.3 kg (210 lb 1.6 oz)    General:  Appears calm and comfortable alert and oriented 3 Eyes:  PERRL, EOMI, normal lids, iris ENT:  grossly normal hearing, lips & tongue Neck:  no LAD, masses or thyromegaly Cardiovascular:  RRR, no m/r/g. No LE edema.  Respiratory:  CTA bilaterally, no w/r/r. Normal respiratory effort. Abdomen:  Distended, supra umbilical and right upper quadrant pain, bowel sounds present, no rebound, positive guarding Skin:  no rash or induration seen on limited exam Musculoskeletal:  grossly normal tone BUE/BLE Psychiatric:  grossly normal mood and affect, speech fluent and appropriate Neurologic:  CN 2-12 grossly intact, moves all extremities in coordinated fashion.          Labs on Admission:  Basic Metabolic Panel:  Recent Labs Lab 01/10/17 0119  NA 136  K 3.9  CL 104  CO2 22  GLUCOSE 140*  BUN 15  CREATININE 1.44*  CALCIUM 8.5*   Liver Function Tests:  Recent Labs Lab  01/10/17 0119  AST 20  ALT 30  ALKPHOS 85  BILITOT 1.1  PROT 6.2*  ALBUMIN 3.1*    Recent Labs Lab 01/10/17 0119  LIPASE 67*   No results for input(s): AMMONIA in the last 168 hours. CBC:  Recent Labs Lab 01/10/17 0119  WBC 12.3*  HGB 12.7*  HCT 38.0*  MCV 89.4  PLT 284   Cardiac Enzymes: No results for input(s): CKTOTAL, CKMB, CKMBINDEX, TROPONINI in the last 168 hours.  BNP (last 3 results) No results for input(s): BNP in the last 8760 hours.  ProBNP (last 3 results) No results for input(s): PROBNP in the last 8760  hours.   Serum creatinine: 1.44 mg/dL High 01/10/17 0119 Estimated creatinine clearance: 56.2 mL/min  CBG: No results for input(s): GLUCAP in the last 168 hours.  Radiological Exams on Admission: Ct Abdomen Pelvis W Contrast  Result Date: 01/10/2017 CLINICAL DATA:  Abdominal pain. Recent ventral hernia repair and left nephrectomy for renal cell carcinoma EXAM: CT ABDOMEN AND PELVIS WITH CONTRAST TECHNIQUE: Multidetector CT imaging of the abdomen and pelvis was performed using the standard protocol following bolus administration of intravenous contrast. CONTRAST:  100 mL ISOVUE-300 IOPAMIDOL (ISOVUE-300) INJECTION 61% COMPARISON:  December 25, 2016 FINDINGS: Lower chest: There is patchy bibasilar atelectatic change. There are foci of coronary artery calcification. There is a hiatal hernia with contrast refluxing from the stomach into the hiatal hernia. Hepatobiliary: No focal liver lesions are appreciable. The gallbladder is slightly distended with thickened wall. There is no appreciable pericholecystic fluid. No biliary duct dilatation. Pancreas: No pancreatic mass or inflammatory focus. Spleen: Spleen is enlarged, measuring 14.3 x 15.7 x 8.1 cm with a measured splenic volume of 909 cubic cm. No focal splenic lesions or perisplenic fluid evident. Adrenals/Urinary Tract: Adrenals appear normal bilaterally. Left kidney is absent. There is no mass in the left  renal fossa region. There are small cysts in the right kidney, largest measuring just over 1 cm. No right renal hydronephrosis. No right renal calculus or right ureteral calculus. Urinary bladder is midline with wall thickness within normal limits. Stomach/Bowel: There are multiple loops of dilated small bowel. Stomach is also dilated. There is a transition zone in the midline portion of the mid abdomen at the level of proximal to mid ileum consistent with bowel obstruction. On coronal view, there is the suggestion of a twisting type pattern in this area suggesting internal hernia as the cause of this obstruction. There is no free air or portal venous air. There are multiple sigmoid diverticula without diverticulitis. Vascular/Lymphatic: There is atherosclerotic calcification in the aorta and common iliac arteries. No abdominal aortic aneurysm. Major mesenteric vessels appear patent. No adenopathy is appreciable in the abdomen or pelvis. Reproductive: Prostate is enlarged and impresses on the inferior urinary bladder. There are multiple prostatic calculi. Seminal vesicles also appear mildly prominent. Other: There is moderate ascites throughout the abdomen. Appendix absent. No abscess evident. There is fluid and thickening in the periumbilical region in the area of recent hernia repair. The fluid collection in this area is less well-defined than on study from 3 weeks prior. There may be liquefying hematoma in this area. There is no air in this area to suggest frank abscess, although infected fluid cannot be excluded by CT. Musculoskeletal: There is lumbar levoscoliosis. There is degenerative change in the lumbar spine. There are no blastic or lytic bone lesions. No intramuscular lesions are evident. IMPRESSION: There is small bowel obstruction with the transition zone in the mid abdomen involving the proximal to mid ileum. On the coronal images, there is a suggestion of a twisting pattern in this area suggesting  that there may be internal hernia in the mid abdomen causing this small bowel obstruction. Moderate ascites. Status post left nephrectomy without complicating feature in the left renal fossa region. Postoperative change at the site of a recent ventral hernia. There is moderate fluid and soft tissue thickening in this area. There is a less well-defined fluid collection in this area compared to 3 weeks prior. Suspect resolving liquefying hematoma. Underlying infected fluid in this area cannot be excluded, although there is no air in this area to  suggest frank abscess. Gallbladder wall appears thickened. Ascites can cause this pattern. Early cholecystitis could also present in this manner. Close clinical surveillance in this regard advised. Splenomegaly. Focal hiatal hernia with spontaneous gastroesophageal reflux. Aortoiliac atherosclerosis.  Foci of coronary artery calcification. Enlarged prostate containing multiple prostatic calculi. Multiple sigmoid diverticulum without diverticulitis. Electronically Signed   By: Lowella Grip III M.D.   On: 01/10/2017 07:47   Dg Abd Acute W/chest  Result Date: 01/10/2017 CLINICAL DATA:  Abdominal pain and constipation. EXAM: DG ABDOMEN ACUTE W/ 1V CHEST COMPARISON:  CT 12/25/2016 FINDINGS: Streaky left basilar atelectasis. Normal heart size with aortic tortuosity. Small hiatal hernia. No free intra-abdominal air. No significant increased stool burden to suggest constipation. Dilated air-filled small bowel in the left abdomen with air-fluid level. Mild gaseous distention of hepatic flexure. No evidence of radiopaque calculi. Pelvic phleboliths are noted. No acute osseous abnormalities are seen. IMPRESSION: 1. No increased stool burden to suggest constipation. 2. A few dilated small bowel loops in the left mid abdomen with air-fluid levels, can be seen with ileus or early obstruction. 3. Streaky left basilar atelectasis. Electronically Signed   By: Jeb Levering M.D.    On: 01/10/2017 02:14    EKG: n/a  Assessment/Plan Principal Problem:   SBO (small bowel obstruction) Active Problems:   CKD (chronic kidney disease) stage 3, GFR 30-59 ml/min   Hypothyroidism   HTN (hypertension)   Post-operative complication   Small bowel obstruction Plan is for surgery today NPO & IVF Medically cleared for surgery Prn dilaudid and toradol for pain  CKD Monitor Cr daily Cr at baseline,  1.4  Hypothyroidism No concerning symptoms currently Resume when off that her restriction  Hypertension When necessary hydralazine 10 mg IV as needed for severe blood pressure  GERD PPI bid  Code Status: full code  DVT Prophylaxis: SCDs Family Communication: wife at bedside Disposition Plan: Pending Improvement  Status: inpt med surg  Elwin Mocha, MD Family Medicine [facility] [email or web address] Password [email or web address]

## 2017-01-10 NOTE — Op Note (Signed)
01/10/2017  5:08 PM  PATIENT:  Eric Suarez  71 y.o. male  PRE-OPERATIVE DIAGNOSIS:  SBO   POST-OPERATIVE DIAGNOSIS:  SBO   PROCEDURE:  Procedure(s): LAPAROSCOPY DIAGNOSTIC , POSSIBLE LAPAROTOMY (N/A) INSERTION OF VICRYL MESH (N/A)  SURGEON:  Surgeon(s) and Role: Ralene Ok, MD - Primary  Coralie Keens, MD - Assisting  ANESTHESIA:   local and general  EBL: 5CC Total I/O In: 550 [I.V.:550] Out: -   BLOOD ADMINISTERED:none  DRAINS: Penrose drain in the 19Fr in preperitoneal space   LOCAL MEDICATIONS USED:  BUPIVICAINE   SPECIMEN:  No Specimen  DISPOSITION OF SPECIMEN:  N/A  COUNTS:  YES  TOURNIQUET:  * No tourniquets in log *  DICTATION: .Dragon Dictation Indications for procedure: Patient is a 71 year old male approximately 2 weeks status post laparoscopic preperitoneal ventral incisional hernia repair with mesh. Patient is returned today after a 1 day history of abdominal pain which is not recovered. He states that the pain increased and secondary to pain he proceeded to the ER. Upon evaluation ER he underwent CT scan which appeared to have a violation of this retrorectus. There appeared to be herniated bowel within this space. Patient was taken back urgently for a diagnostic laparoscopy.  Details of procedure: After the patient was consented he was taken back to the OR and placed in the supine position with bilateral SCDs in place.  Patient underwent general endotracheal anesthesia. Patient prepped and draped in the standard fashion.  A timeout was called all facts verified.  A Veress needle technique was used to insufflate the abdomen to 15 mmHg the left subcostal margin. Subsequent visit, trocar and camera placed intra-abdominally. There is no injury to any intra-abdominal organs. At this time a final trocar is placed in left lower quadrant direct visualization. At this time was able to visualize the midline fascia there was apparent violation of the  posterior rectus sheath. There is bowel herniated into the retrorectus space. With anterior pressure the small bowel easily reduced into the abdominal cavity. There were some omental adhesions superiorly. At this time there was approximately 3 cm hole that was communicating from the preperitoneal space from the abdominal cavity.    At this time a 4 [place] Blake drain was then placed into the retrorectus  via a stab incision on the right upper quadrant under direct visualization. This was secured to the abdominal wall using a 2-0 nylon.  At this time the left subcostal margin 5 mm trocar was upsized to a 12 mm trocar. At this time a piece of Vicryl mesh was cut to overlap the posterior rectus sheath. This overlapped the whole circumferentially I to 3 cm. This was tacked up to the peritoneum using a Covidein universal hernia stapler with 4.8 mm staples. This time the omentum was brought of the small bowel. Insufflation was evacuated under direct visualization. The mesh lay flat. At this time all trochars removed. The 12 mm trocar site, anterior fascia, was reapproximated using a figure-of-eight 0 Vicryl UR 6.  At the time the skin was approximated using 4-0 Monocryl subcuticular fashion. Skin was dressed with gauze and tape. The patient tied the procedure well was taken to the recovery room in stable condition.   PLAN OF CARE: Admit to inpatient   PATIENT DISPOSITION:  PACU - hemodynamically stable.   Delay start of Pharmacological VTE agent (>24hrs) due to surgical blood loss or risk of bleeding: not applicable

## 2017-01-10 NOTE — ED Provider Notes (Signed)
[facility] DEPT Provider Note   CSN: [id number] Arrival date & time: 01/10/17  0103     History   Chief Complaint Chief Complaint  Patient presents with  . Abdominal Pain   HPI   Blood pressure 133/71, pulse 81, temperature 98.7 F (37.1 C), temperature source Oral, resp. rate 17, height 6\' 3"  (1.905 m), weight 93 kg, SpO2 98 %.  KYZER KROTZ is a 71 y.o. male complaining of abdominal pain, distention and constipation with no passage of flatus worsening over the course of last day. He recently had a nephrectomy and incisional hernia repair. This was complicated by urinary retention requiring Foley which now resolved. He denies fevers, chills, nausea vomiting.   Past Medical History:  Diagnosis Date  . Anxiety   . Arthritis   . Cancer [facility]) [id number]   thryoid, left kidney  . Cataracts, bilateral   . Complication of anesthesia    unable to void after surgery  . Coronary artery disease    pt denies- had stress>10 yrs ago due to"get winded walking uphill'- neg  . GERD (gastroesophageal reflux disease)   . History of hiatal hernia   . History of kidney surgery    left kidney removed due to cancer 2017  . Hypertension   . Hypothyroidism   . Left renal mass   . Macular degeneration of both eyes    dry  . PONV (postoperative nausea and vomiting)     Patient Active Problem List   Diagnosis Date Noted  . Severe sepsis ([place]) 12/25/2016  . Flu-like symptoms 12/25/2016  . Dehydration with hyponatremia 12/25/2016  . AKI (acute kidney injury) ([place]) 12/25/2016  . CKD (chronic kidney disease) stage 3, GFR 30-59 ml/min 12/25/2016  . S/P repair of ventral hernia 12/25/2016  . Hypothyroidism 12/25/2016  . HTN (hypertension) 12/25/2016  . Neoplasm of left kidney 04/18/2016    Past Surgical History:  Procedure Laterality Date  . APPENDECTOMY    . CYSTOSCOPY WITH RETROGRADE PYELOGRAM, URETEROSCOPY AND STENT PLACEMENT Left 04/01/2016   Procedure: CYSTOSCOPY WITH RETROGRADE  PYELOGRAM, left ureter;  Surgeon: Raynelle Bring, MD;  Location: [facility];  Service: Urology;  Laterality: Left;  . HEMORRHOID SURGERY    . HERNIA REPAIR     inguinal-3 on left , 1 on right  . INCISIONAL HERNIA REPAIR N/A 12/23/2016   Procedure: LAPAROSCOPIC VERSUS OPEN LYSIS OF ADHESIONS AND INCISIONAL HERNIA REPAIR WITH MESH;  Surgeon: Ralene Ok, MD;  Location: [place];  Service: General;  Laterality: N/A;  . INSERTION OF MESH N/A 12/23/2016   Procedure: INSERTION OF MESH;  Surgeon: Ralene Ok, MD;  Location: [place];  Service: General;  Laterality: N/A;  . LAPAROSCOPIC NEPHRECTOMY Left 04/18/2016   Procedure: LAPAROSCOPIC RADICAL  LEFT NEPHRECTOMY;  Surgeon: Raynelle Bring, MD;  Location: [facility];  Service: Urology;  Laterality: Left;  . NASAL SINUS SURGERY     left side  . THYROIDECTOMY, PARTIAL     left small amount on left side-for cancer  . TONSILLECTOMY         Home Medications    Prior to Admission medications   Medication Sig Start Date End Date Taking? Authorizing Provider  atorvastatin (LIPITOR) 20 MG tablet Take 20 mg by mouth at bedtime.    Historical Provider, MD  Coenzyme Q10 (COQ10) 100 MG CAPS Take 1 capsule by mouth daily.    Historical Provider, MD  docusate sodium (COLACE) 100 MG capsule Take 1 capsule (100 mg total) by mouth daily. 12/28/16  Jennifer Chahn-Yang Choi, DO  lansoprazole (PREVACID) 15 MG capsule Take 15 mg by mouth at bedtime.    Historical Provider, MD  Multiple Vitamins-Minerals (PRESERVISION AREDS PO) Take 1 capsule by mouth daily.    Historical Provider, MD  Omega-3 Fatty Acids (FISH OIL) 1200 MG CAPS Take 2 capsules by mouth 2 (two) times daily.    Historical Provider, MD  OVER THE COUNTER MEDICATION Take 1 tablet by mouth daily. Cognium - Brain Vitamin    Historical Provider, MD  oxyCODONE-acetaminophen (ROXICET) 5-325 MG tablet Take 1-2 tablets by mouth every 4 (four) hours as needed. 12/23/16   Ralene Ok, MD  PARoxetine (PAXIL-CR) 25 MG 24  hr tablet Take 25 mg by mouth daily. 02/21/16   Historical Provider, MD  phenol (CHLORASEPTIC) 1.4 % LIQD Use as directed 1 spray in the mouth or throat as needed for throat irritation / pain.    Historical Provider, MD  polyethylene glycol (MIRALAX / GLYCOLAX) packet Take 17 g by mouth daily. 12/28/16   Jennifer Chahn-Yang Choi, DO  SYNTHROID 125 MCG tablet Take 125 mcg by mouth daily.  01/10/16   Historical Provider, MD    Family History No family history on file.  Social History Social History  Substance Use Topics  . Smoking status: Never Smoker  . Smokeless tobacco: Never Used  . Alcohol use No     Allergies   Tamsulosin and Sulfa antibiotics   Review of Systems Review of Systems  10 systems reviewed and found to be negative, except as noted in the HPI.   Physical Exam Updated Vital Signs BP 133/71   Pulse 81   Temp 98.7 F (37.1 C) (Oral)   Resp 17   Ht 6\' 3"  (1.905 m)   Wt 93 kg   SpO2 98%   BMI 25.62 kg/m   Physical Exam  Constitutional: He is oriented to person, place, and time. He appears well-developed and well-nourished. No distress.  HENT:  Head: Normocephalic and atraumatic.  Mouth/Throat: Oropharynx is clear and moist.  Eyes: Conjunctivae and EOM are normal. Pupils are equal, round, and reactive to light.  Neck: Normal range of motion.  Cardiovascular: Normal rate, regular rhythm and intact distal pulses.   Pulmonary/Chest: Effort normal and breath sounds normal.  Abdominal: Soft. He exhibits distension. There is tenderness.  Distention, hypoactive bowel sounds, diffusely tender to palpation. No guarding or rebound.  Musculoskeletal: Normal range of motion.  Neurological: He is alert and oriented to person, place, and time.  Skin: He is not diaphoretic.  Psychiatric: He has a normal mood and affect.  Nursing note and vitals reviewed.    ED Treatments / Results  Labs (all labs ordered are listed, but only abnormal results are displayed) Labs  Reviewed  LIPASE, BLOOD - Abnormal; Notable for the following:       Result Value   Lipase 67 (*)    All other components within normal limits  COMPREHENSIVE METABOLIC PANEL - Abnormal; Notable for the following:    Glucose, Bld 140 (*)    Creatinine, Ser 1.44 (*)    Calcium 8.5 (*)    Total Protein 6.2 (*)    Albumin 3.1 (*)    GFR calc non Af Amer 47 (*)    GFR calc Af Amer 55 (*)    All other components within normal limits  CBC - Abnormal; Notable for the following:    WBC 12.3 (*)    Hemoglobin 12.7 (*)    HCT 38.0 (*)  All other components within normal limits  URINALYSIS, ROUTINE W REFLEX MICROSCOPIC - Abnormal; Notable for the following:    APPearance HAZY (*)    Ketones, ur 15 (*)    Leukocytes, UA SMALL (*)    All other components within normal limits  URINALYSIS, MICROSCOPIC (REFLEX) - Abnormal; Notable for the following:    Bacteria, UA RARE (*)    Squamous Epithelial / LPF 0-5 (*)    All other components within normal limits  I-STAT CG4 LACTIC ACID, ED    EKG  EKG Interpretation None       Radiology Ct Abdomen Pelvis W Contrast  Result Date: 01/10/2017 CLINICAL DATA:  Abdominal pain. Recent ventral hernia repair and left nephrectomy for renal cell carcinoma EXAM: CT ABDOMEN AND PELVIS WITH CONTRAST TECHNIQUE: Multidetector CT imaging of the abdomen and pelvis was performed using the standard protocol following bolus administration of intravenous contrast. CONTRAST:  100 mL ISOVUE-300 IOPAMIDOL (ISOVUE-300) INJECTION 61% COMPARISON:  December 25, 2016 FINDINGS: Lower chest: There is patchy bibasilar atelectatic change. There are foci of coronary artery calcification. There is a hiatal hernia with contrast refluxing from the stomach into the hiatal hernia. Hepatobiliary: No focal liver lesions are appreciable. The gallbladder is slightly distended with thickened wall. There is no appreciable pericholecystic fluid. No biliary duct dilatation. Pancreas: No  pancreatic mass or inflammatory focus. Spleen: Spleen is enlarged, measuring 14.3 x 15.7 x 8.1 cm with a measured splenic volume of 909 cubic cm. No focal splenic lesions or perisplenic fluid evident. Adrenals/Urinary Tract: Adrenals appear normal bilaterally. Left kidney is absent. There is no mass in the left renal fossa region. There are small cysts in the right kidney, largest measuring just over 1 cm. No right renal hydronephrosis. No right renal calculus or right ureteral calculus. Urinary bladder is midline with wall thickness within normal limits. Stomach/Bowel: There are multiple loops of dilated small bowel. Stomach is also dilated. There is a transition zone in the midline portion of the mid abdomen at the level of proximal to mid ileum consistent with bowel obstruction. On coronal view, there is the suggestion of a twisting type pattern in this area suggesting internal hernia as the cause of this obstruction. There is no free air or portal venous air. There are multiple sigmoid diverticula without diverticulitis. Vascular/Lymphatic: There is atherosclerotic calcification in the aorta and common iliac arteries. No abdominal aortic aneurysm. Major mesenteric vessels appear patent. No adenopathy is appreciable in the abdomen or pelvis. Reproductive: Prostate is enlarged and impresses on the inferior urinary bladder. There are multiple prostatic calculi. Seminal vesicles also appear mildly prominent. Other: There is moderate ascites throughout the abdomen. Appendix absent. No abscess evident. There is fluid and thickening in the periumbilical region in the area of recent hernia repair. The fluid collection in this area is less well-defined than on study from 3 weeks prior. There may be liquefying hematoma in this area. There is no air in this area to suggest frank abscess, although infected fluid cannot be excluded by CT. Musculoskeletal: There is lumbar levoscoliosis. There is degenerative change in the  lumbar spine. There are no blastic or lytic bone lesions. No intramuscular lesions are evident. IMPRESSION: There is small bowel obstruction with the transition zone in the mid abdomen involving the proximal to mid ileum. On the coronal images, there is a suggestion of a twisting pattern in this area suggesting that there may be internal hernia in the mid abdomen causing this small bowel obstruction. Moderate ascites.  Status post left nephrectomy without complicating feature in the left renal fossa region. Postoperative change at the site of a recent ventral hernia. There is moderate fluid and soft tissue thickening in this area. There is a less well-defined fluid collection in this area compared to 3 weeks prior. Suspect resolving liquefying hematoma. Underlying infected fluid in this area cannot be excluded, although there is no air in this area to suggest frank abscess. Gallbladder wall appears thickened. Ascites can cause this pattern. Early cholecystitis could also present in this manner. Close clinical surveillance in this regard advised. Splenomegaly. Focal hiatal hernia with spontaneous gastroesophageal reflux. Aortoiliac atherosclerosis.  Foci of coronary artery calcification. Enlarged prostate containing multiple prostatic calculi. Multiple sigmoid diverticulum without diverticulitis. Electronically Signed   By: Lowella Grip III M.D.   On: 01/10/2017 07:47   Dg Abd Acute W/chest  Result Date: 01/10/2017 CLINICAL DATA:  Abdominal pain and constipation. EXAM: DG ABDOMEN ACUTE W/ 1V CHEST COMPARISON:  CT 12/25/2016 FINDINGS: Streaky left basilar atelectasis. Normal heart size with aortic tortuosity. Small hiatal hernia. No free intra-abdominal air. No significant increased stool burden to suggest constipation. Dilated air-filled small bowel in the left abdomen with air-fluid level. Mild gaseous distention of hepatic flexure. No evidence of radiopaque calculi. Pelvic phleboliths are noted. No acute  osseous abnormalities are seen. IMPRESSION: 1. No increased stool burden to suggest constipation. 2. A few dilated small bowel loops in the left mid abdomen with air-fluid levels, can be seen with ileus or early obstruction. 3. Streaky left basilar atelectasis. Electronically Signed   By: Jeb Levering M.D.   On: 01/10/2017 02:14    Procedures Procedures (including critical care time)  Medications Ordered in ED Medications  HYDROmorphone (DILAUDID) injection 0.5 mg (not administered)  HYDROmorphone (DILAUDID) injection 0.5 mg (0.5 mg Intravenous Given 01/10/17 0415)  ondansetron (ZOFRAN) injection 4 mg (4 mg Intravenous Given 01/10/17 0415)  dextrose 5 %-0.9 % sodium chloride infusion ( Intravenous Stopped 01/10/17 0645)  iopamidol (ISOVUE-300) 61 % injection (30 mLs  Contrast Given 01/10/17 0415)  iopamidol (ISOVUE-300) 61 % injection (100 mLs Intravenous Contrast Given 01/10/17 0706)     Initial Impression / Assessment and Plan / ED Course  I have reviewed the triage vital signs and the nursing notes.  Pertinent labs & imaging results that were available during my care of the patient were reviewed by me and considered in my medical decision making (see chart for details).     Vitals:   01/10/17 0515 01/10/17 0600 01/10/17 0630 01/10/17 0645  BP: 123/69 115/70 122/70 133/71  Pulse: 66 68 67 81  Resp: 13 17 17    Temp:      TempSrc:      SpO2: 97% 98% 97% 98%  Weight:      Height:        Medications  HYDROmorphone (DILAUDID) injection 0.5 mg (not administered)  HYDROmorphone (DILAUDID) injection 0.5 mg (0.5 mg Intravenous Given 01/10/17 0415)  ondansetron (ZOFRAN) injection 4 mg (4 mg Intravenous Given 01/10/17 0415)  dextrose 5 %-0.9 % sodium chloride infusion ( Intravenous Stopped 01/10/17 0645)  iopamidol (ISOVUE-300) 61 % injection (30 mLs  Contrast Given 01/10/17 0415)  iopamidol (ISOVUE-300) 61 % injection (100 mLs Intravenous Contrast Given 01/10/17 0706)    ANANDA GIUSTO is 71  y.o. male presenting with Abdominal pain, distention is not passing flatus or stool. Plain film consistent with small bowel obstruction, he's had multiple abdominal urge series recently in the remote past. He is afebrile  and well-appearing, not vomiting, no indication for NG tube at this time. Case signed out to PA Sanders at shift change: Plan is to follow-up CT, discuss with surgery and likely medical admission.   Final Clinical Impressions(s) / ED Diagnoses   Final diagnoses:  SBO (small bowel obstruction)    New Prescriptions New Prescriptions   No medications on file     Monico Blitz, PA-C [phone]    Delora Fuel, MD [phone]

## 2017-01-10 NOTE — ED Triage Notes (Signed)
Patient with abdominal pain and constipation.  Patient had hernia surgery on 2/12.  Patient states he has been sick with viral illness and had not been taking his stool softener as he should have.  He states that the pain has gotten worse in the last few hours.

## 2017-01-10 NOTE — Anesthesia Preprocedure Evaluation (Signed)
Anesthesia Evaluation  Patient identified by MRN, date of birth, ID band Patient awake    Reviewed: Allergy & Precautions, H&P , NPO status , Patient's Chart, lab work & pertinent test results  Airway Mallampati: III  TM Distance: >3 FB Neck ROM: Full    Dental no notable dental hx. (+) Teeth Intact, Dental Advisory Given   Pulmonary neg pulmonary ROS,    Pulmonary exam normal breath sounds clear to auscultation       Cardiovascular hypertension, negative cardio ROS   Rhythm:Regular Rate:Normal     Neuro/Psych negative neurological ROS  negative psych ROS   GI/Hepatic Neg liver ROS, GERD  Medicated and Controlled,  Endo/Other  negative endocrine ROSHypothyroidism   Renal/GU Renal diseasenegative Renal ROS  negative genitourinary   Musculoskeletal  (+) Arthritis ,   Abdominal   Peds  Hematology negative hematology ROS (+)   Anesthesia Other Findings   Reproductive/Obstetrics negative OB ROS                             Anesthesia Physical Anesthesia Plan  ASA: III  Anesthesia Plan: General   Post-op Pain Management:    Induction: Intravenous, Rapid sequence and Cricoid pressure planned  Airway Management Planned: Oral ETT  Additional Equipment:   Intra-op Plan:   Post-operative Plan: Extubation in OR  Informed Consent: I have reviewed the patients History and Physical, chart, labs and discussed the procedure including the risks, benefits and alternatives for the proposed anesthesia with the patient or authorized representative who has indicated his/her understanding and acceptance.   Dental advisory given  Plan Discussed with: CRNA  Anesthesia Plan Comments:         Anesthesia Quick Evaluation

## 2017-01-10 NOTE — ED Notes (Signed)
CT contacted for imaging 

## 2017-01-10 NOTE — Progress Notes (Signed)
Patient ID: Eric Suarez, male   DOB: 1946/09/28, 71 y.o.   MRN: [id number]  I have reviewed the CT and Dr. Rosendo Gros has as well.  He has an internal hernia post op. He will require surgical reduction of this.  Will attempt it laparoscopically. I discussed the risks which include but is not limited to bleeding, infection, the need for bowel resection, the need to convert to an open procedure, injury to surrounding structures, cardiopulmonary issues, etc. Will plan for later today when Dr. Rosendo Gros is available.

## 2017-01-10 NOTE — Transfer of Care (Signed)
Immediate Anesthesia Transfer of Care Note  Patient: Eric Suarez  Procedure(s) Performed: Procedure(s): LAPAROSCOPY DIAGNOSTIC , POSSIBLE LAPAROTOMY (N/A) INSERTION OF MESH (N/A)  Patient Location: PACU  Anesthesia Type:General  Level of Consciousness: awake, alert , oriented and patient cooperative  Airway & Oxygen Therapy: Patient Spontanous Breathing and Patient connected to nasal cannula oxygen  Post-op Assessment: Report given to RN, Post -op Vital signs reviewed and stable and Patient moving all extremities  Post vital signs: Reviewed and stable  Last Vitals:  Vitals:   01/10/17 1325 01/10/17 1720  BP: 124/80   Pulse: 82   Resp: 16   Temp:  37.2 C    Last Pain:  Vitals:   01/10/17 1325  TempSrc:   PainSc: 7          Complications: No apparent anesthesia complications

## 2017-01-10 NOTE — Anesthesia Postprocedure Evaluation (Signed)
Anesthesia Post Note  Patient: Eric Suarez  Procedure(s) Performed: Procedure(s) (LRB): LAPAROSCOPY DIAGNOSTIC , POSSIBLE LAPAROTOMY (N/A) INSERTION OF MESH (N/A)  Patient location during evaluation: PACU Anesthesia Type: General Level of consciousness: sedated and patient cooperative Pain management: pain level controlled Vital Signs Assessment: post-procedure vital signs reviewed and stable Respiratory status: spontaneous breathing Cardiovascular status: stable Anesthetic complications: no       Last Vitals:  Vitals:   01/10/17 1720 01/10/17 1730  BP: 129/70 129/70  Pulse: 98 88  Resp: (!) 22 19  Temp: 37.2 C     Last Pain:  Vitals:   01/10/17 1720  TempSrc:   PainSc: 0-No pain                 Nolon Nations

## 2017-01-10 NOTE — ED Provider Notes (Signed)
Assumed care from [place] at shift change. See her note for full H&P.  Briefly, 71 year old male here with abdominal pain. No bowel movement or passage of flatus in the past 24 hours. Did have recent right nephrectomy as well as incisional hernia repair. Labwork with mild leukocytosis. Plain films concerning for small bowel obstruction versus ileus.  Plan:  CT pending.  Likely admit.  Results for orders placed or performed during the hospital encounter of 01/10/17  Lipase, blood  Result Value Ref Range   Lipase 67 (H) 11 - 51 U/L  Comprehensive metabolic panel  Result Value Ref Range   Sodium 136 135 - 145 mmol/L   Potassium 3.9 3.5 - 5.1 mmol/L   Chloride 104 101 - 111 mmol/L   CO2 22 22 - 32 mmol/L   Glucose, Bld 140 (H) 65 - 99 mg/dL   BUN 15 6 - 20 mg/dL   Creatinine, Ser 1.44 (H) 0.61 - 1.24 mg/dL   Calcium 8.5 (L) 8.9 - 10.3 mg/dL   Total Protein 6.2 (L) 6.5 - 8.1 g/dL   Albumin 3.1 (L) 3.5 - 5.0 g/dL   AST 20 15 - 41 U/L   ALT 30 17 - 63 U/L   Alkaline Phosphatase 85 38 - 126 U/L   Total Bilirubin 1.1 0.3 - 1.2 mg/dL   GFR calc non Af Amer 47 (L) >60 mL/min   GFR calc Af Amer 55 (L) >60 mL/min   Anion gap 10 5 - 15  CBC  Result Value Ref Range   WBC 12.3 (H) 4.0 - 10.5 K/uL   RBC 4.25 4.22 - 5.81 MIL/uL   Hemoglobin 12.7 (L) 13.0 - 17.0 g/dL   HCT 38.0 (L) 39.0 - 52.0 %   MCV 89.4 78.0 - 100.0 fL   MCH 29.9 26.0 - 34.0 pg   MCHC 33.4 30.0 - 36.0 g/dL   RDW 14.3 11.5 - 15.5 %   Platelets 284 150 - 400 K/uL  Urinalysis, Routine w reflex microscopic  Result Value Ref Range   Color, Urine YELLOW YELLOW   APPearance HAZY (A) CLEAR   Specific Gravity, Urine 1.015 1.005 - 1.030   pH 8.0 5.0 - 8.0   Glucose, UA NEGATIVE NEGATIVE mg/dL   Hgb urine dipstick NEGATIVE NEGATIVE   Bilirubin Urine NEGATIVE NEGATIVE   Ketones, ur 15 (A) NEGATIVE mg/dL   Protein, ur NEGATIVE NEGATIVE mg/dL   Nitrite NEGATIVE NEGATIVE   Leukocytes, UA SMALL (A) NEGATIVE  Urinalysis,  Microscopic (reflex)  Result Value Ref Range   RBC / HPF NONE SEEN 0 - 5 RBC/hpf   WBC, UA 6-30 0 - 5 WBC/hpf   Bacteria, UA RARE (A) NONE SEEN   Squamous Epithelial / LPF 0-5 (A) NONE SEEN  I-Stat CG4 Lactic Acid, ED  Result Value Ref Range   Lactic Acid, Venous 1.72 0.5 - 1.9 mmol/L   Ct Abdomen Pelvis Wo Contrast  Result Date: 12/25/2016 CLINICAL DATA:  Generalized body aches.  Abdominal pain. EXAM: CT ABDOMEN AND PELVIS WITHOUT CONTRAST TECHNIQUE: Multidetector CT imaging of the abdomen and pelvis was performed following the standard protocol without IV contrast. COMPARISON:  11/29/2016 FINDINGS: Lower chest: Dependent opacities in the lung bases, likely atelectasis. Heart is normal size. Coronary artery calcifications in the left main, left anterior and left circumflex coronary arteries. Trace bilateral effusions. Hepatobiliary: No focal hepatic abnormality. Gallbladder unremarkable. Pancreas: No focal abnormality or ductal dilatation. Spleen: Mild splenomegaly with a craniocaudal length of the spleen measuring 14.4 cm.  Findings are stable. Adrenals/Urinary Tract: Left kidney is absent. Multiple calcifications noted along the posterior bladder wall. The bladder wall appears thickened. Foley catheter is in place within the bladder which is decompressed. Punctate calcification near the right UVJ could be knee right ureteral stone. No hydronephrosis on the right. No renal stones. Adrenal glands are unremarkable. Stomach/Bowel: Scattered left colonic diverticulosis. No active diverticulitis. No evidence of bowel obstruction. Vascular/Lymphatic: Aortic and iliac calcifications. No aneurysm or adenopathy. Reproductive: Prostate enlargement and calcifications. Other: Trace free fluid in the pelvis. Fluid is noted in the midline of the anterior abdominal wall, in the area of prior hernia, likely postoperative fluid collection. This measures 3.8 x 3.6 cm. Stranding noted within the subcutaneous soft tissues  of the anterior abdominal wall, likely postoperative. A few locules of gas in the subcutaneous soft tissues, likely postoperative. Musculoskeletal: No acute bony abnormality. IMPRESSION: Postoperative changes from ventral hernia repair. Fluid collection at the site of prior ventral hernia measures 3.8 x 3.6 cm. This could represent postoperative seroma, hematoma or abscess. Trace bilateral pleural effusions. Bibasilar dependent opacities, likely atelectasis. Stable splenomegaly. Prostate enlargement with calcifications along the posterior bladder wall, most of which are likely in the collapsed lumen although at least 1 right posterior calcification on image 85 could be within the wall of the bladder. Also, an adjacent calcification is near the right ureterovesical junction. No hydronephrosis. Scattered colonic diverticulosis.  No active diverticulitis. Electronically Signed   By: Rolm Baptise M.D.   On: 12/25/2016 14:31   Dg Chest 2 View  Result Date: 12/25/2016 CLINICAL DATA:  Short of breath.  Fall today. EXAM: CHEST  2 VIEW COMPARISON:  10/24/2016 FINDINGS: Heart size upper normal. Mild vascular congestion with interstitial edema. Small pleural effusions bilaterally. Mild bibasilar atelectasis with hypoventilation. No acute skeletal abnormality. IMPRESSION: Mild heart failure with interstitial edema and small pleural effusions. Mild bibasilar atelectasis. Electronically Signed   By: Franchot Gallo M.D.   On: 12/25/2016 14:45   Ct Abdomen Pelvis W Contrast  Result Date: 01/10/2017 CLINICAL DATA:  Abdominal pain. Recent ventral hernia repair and left nephrectomy for renal cell carcinoma EXAM: CT ABDOMEN AND PELVIS WITH CONTRAST TECHNIQUE: Multidetector CT imaging of the abdomen and pelvis was performed using the standard protocol following bolus administration of intravenous contrast. CONTRAST:  100 mL ISOVUE-300 IOPAMIDOL (ISOVUE-300) INJECTION 61% COMPARISON:  December 25, 2016 FINDINGS: Lower chest: There  is patchy bibasilar atelectatic change. There are foci of coronary artery calcification. There is a hiatal hernia with contrast refluxing from the stomach into the hiatal hernia. Hepatobiliary: No focal liver lesions are appreciable. The gallbladder is slightly distended with thickened wall. There is no appreciable pericholecystic fluid. No biliary duct dilatation. Pancreas: No pancreatic mass or inflammatory focus. Spleen: Spleen is enlarged, measuring 14.3 x 15.7 x 8.1 cm with a measured splenic volume of 909 cubic cm. No focal splenic lesions or perisplenic fluid evident. Adrenals/Urinary Tract: Adrenals appear normal bilaterally. Left kidney is absent. There is no mass in the left renal fossa region. There are small cysts in the right kidney, largest measuring just over 1 cm. No right renal hydronephrosis. No right renal calculus or right ureteral calculus. Urinary bladder is midline with wall thickness within normal limits. Stomach/Bowel: There are multiple loops of dilated small bowel. Stomach is also dilated. There is a transition zone in the midline portion of the mid abdomen at the level of proximal to mid ileum consistent with bowel obstruction. On coronal view, there is the suggestion  of a twisting type pattern in this area suggesting internal hernia as the cause of this obstruction. There is no free air or portal venous air. There are multiple sigmoid diverticula without diverticulitis. Vascular/Lymphatic: There is atherosclerotic calcification in the aorta and common iliac arteries. No abdominal aortic aneurysm. Major mesenteric vessels appear patent. No adenopathy is appreciable in the abdomen or pelvis. Reproductive: Prostate is enlarged and impresses on the inferior urinary bladder. There are multiple prostatic calculi. Seminal vesicles also appear mildly prominent. Other: There is moderate ascites throughout the abdomen. Appendix absent. No abscess evident. There is fluid and thickening in the  periumbilical region in the area of recent hernia repair. The fluid collection in this area is less well-defined than on study from 3 weeks prior. There may be liquefying hematoma in this area. There is no air in this area to suggest frank abscess, although infected fluid cannot be excluded by CT. Musculoskeletal: There is lumbar levoscoliosis. There is degenerative change in the lumbar spine. There are no blastic or lytic bone lesions. No intramuscular lesions are evident. IMPRESSION: There is small bowel obstruction with the transition zone in the mid abdomen involving the proximal to mid ileum. On the coronal images, there is a suggestion of a twisting pattern in this area suggesting that there may be internal hernia in the mid abdomen causing this small bowel obstruction. Moderate ascites. Status post left nephrectomy without complicating feature in the left renal fossa region. Postoperative change at the site of a recent ventral hernia. There is moderate fluid and soft tissue thickening in this area. There is a less well-defined fluid collection in this area compared to 3 weeks prior. Suspect resolving liquefying hematoma. Underlying infected fluid in this area cannot be excluded, although there is no air in this area to suggest frank abscess. Gallbladder wall appears thickened. Ascites can cause this pattern. Early cholecystitis could also present in this manner. Close clinical surveillance in this regard advised. Splenomegaly. Focal hiatal hernia with spontaneous gastroesophageal reflux. Aortoiliac atherosclerosis.  Foci of coronary artery calcification. Enlarged prostate containing multiple prostatic calculi. Multiple sigmoid diverticulum without diverticulitis. Electronically Signed   By: Lowella Grip III M.D.   On: 01/10/2017 07:47   Dg Knee Complete 4 Views Left  Result Date: 12/25/2016 CLINICAL DATA:  Bilateral knee pain.  Fell. EXAM: LEFT KNEE - COMPLETE 4+ VIEW; RIGHT KNEE - COMPLETE 4+ VIEW  COMPARISON:  None. FINDINGS: The patellofemoral joint spaces are narrowed bilaterally. The medial and lateral compartments are fairly well maintained. There is also mild scalloping of the distal femur on lateral films. These findings can be seen with CPPD arthropathy. No obvious chondrocalcinosis. No acute fracture or osteochondral lesion. IMPRESSION: 1. No acute fracture. 2. Patellofemoral joint degenerative changes, small joint effusions and scalloping of the distal femurs most typically seen with CPPD arthropathy. No definite chondrocalcinosis. Electronically Signed   By: Marijo Sanes M.D.   On: 12/25/2016 14:59   Dg Knee Complete 4 Views Right  Result Date: 12/25/2016 CLINICAL DATA:  Bilateral knee pain.  Fell. EXAM: LEFT KNEE - COMPLETE 4+ VIEW; RIGHT KNEE - COMPLETE 4+ VIEW COMPARISON:  None. FINDINGS: The patellofemoral joint spaces are narrowed bilaterally. The medial and lateral compartments are fairly well maintained. There is also mild scalloping of the distal femur on lateral films. These findings can be seen with CPPD arthropathy. No obvious chondrocalcinosis. No acute fracture or osteochondral lesion. IMPRESSION: 1. No acute fracture. 2. Patellofemoral joint degenerative changes, small joint effusions and scalloping of  the distal femurs most typically seen with CPPD arthropathy. No definite chondrocalcinosis. Electronically Signed   By: Marijo Sanes M.D.   On: 12/25/2016 14:59   Dg Abd Acute W/chest  Result Date: 01/10/2017 CLINICAL DATA:  Abdominal pain and constipation. EXAM: DG ABDOMEN ACUTE W/ 1V CHEST COMPARISON:  CT 12/25/2016 FINDINGS: Streaky left basilar atelectasis. Normal heart size with aortic tortuosity. Small hiatal hernia. No free intra-abdominal air. No significant increased stool burden to suggest constipation. Dilated air-filled small bowel in the left abdomen with air-fluid level. Mild gaseous distention of hepatic flexure. No evidence of radiopaque calculi. Pelvic  phleboliths are noted. No acute osseous abnormalities are seen. IMPRESSION: 1. No increased stool burden to suggest constipation. 2. A few dilated small bowel loops in the left mid abdomen with air-fluid levels, can be seen with ileus or early obstruction. 3. Streaky left basilar atelectasis. Electronically Signed   By: Jeb Levering M.D.   On: 01/10/2017 02:14    CT scan does confirm small bowel obstruction with transition point in the mid abdomen. Question of possible internal hernia causing this. Will discuss with general surgery. Likely will need medical admission given his multiple chronic medical problems.  8:34 AM Spoke with general surgery, PA Jerene Pitch-- will come and evaluate.  Agrees with medical admission.  hospitalist service (Dr. Aggie Moats) will admit for ongoing care.   Larene Pickett, PA-C 01/10/17 [place], MD 01/10/17 [phone]

## 2017-01-10 NOTE — Anesthesia Procedure Notes (Signed)
Procedure Name: Intubation Date/Time: 01/10/2017 4:06 PM Performed by: Jacquiline Doe A Pre-anesthesia Checklist: Patient identified, Emergency Drugs available, Suction available and Patient being monitored Patient Re-evaluated:Patient Re-evaluated prior to inductionOxygen Delivery Method: Circle System Utilized and Circle system utilized Preoxygenation: Pre-oxygenation with 100% oxygen Intubation Type: IV induction, Rapid sequence and Cricoid Pressure applied Laryngoscope Size: Mac and 4 Grade View: Grade I Tube type: Oral Tube size: 7.5 mm Number of attempts: 1 Airway Equipment and Method: Stylet Placement Confirmation: ETT inserted through vocal cords under direct vision,  positive ETCO2 and breath sounds checked- equal and bilateral Secured at: 23 cm Tube secured with: Tape Dental Injury: Teeth and Oropharynx as per pre-operative assessment

## 2017-01-10 NOTE — Consult Note (Signed)
[facility] Consult Note  Eric Suarez 19-Sep-1946  [id number].    Requesting MD: Aggie Moats Chief Complaint/Reason for Consult: SBO  HPI:  Eric Suarez is a 71yo male who presented to [facility] earlier today with gradually worsening global abdominal. He is 2.5 weeks s/p lap incisional hernia repair with mesh and bilateral musculocutaneous advancement flaps 12/23/16 by Dr. Rosendo Gros. States that the pain began a few days ago and became severe last night. The pain was constant and worse with movement. States that his abdomen was distended and he thought he was constipated therefore tried an enema, but this did not help. He had a small BM yesterday and has not passed flatus since yesterday. Denies fever, chills, nausea, vomiting, or dysuria.  PMH significant for HLD, HTN, Hypothyroidism Abdominal surgical history includes left nephrectomy 04/18/2016 by Dr. Alinda Money, lap incisional hernia repair with mesh and bilateral musculocutaneous advancement flaps 12/23/16 by Dr. Rosendo Gros, h/o 3 recurrent inguinal hernias in the pas all repaired in open fashion, and right inguinal hernia repaired in open fashion Anticoagulants: none Lives at home with his wife  ROS: Review of Systems  Constitutional: Negative.   HENT: Negative.   Eyes: Negative.   Respiratory: Negative.   Cardiovascular: Negative.   Gastrointestinal: Positive for abdominal pain, constipation and heartburn. Negative for diarrhea, nausea and vomiting.  Genitourinary: Negative.   Musculoskeletal: Negative.   Skin: Negative.   Neurological: Negative.    All systems reviewed and otherwise negative except for as above  No family history on file.  Past Medical History:  Diagnosis Date  . Anxiety   . Arthritis   . Cancer [facility]) [id number]   thryoid, left kidney  . Cataracts, bilateral   . Complication of anesthesia    unable to void after surgery  . Coronary artery disease    pt denies- had stress>10 yrs ago due to"get winded  walking uphill'- neg  . GERD (gastroesophageal reflux disease)   . History of hiatal hernia   . History of kidney surgery    left kidney removed due to cancer 2017  . Hypertension   . Hypothyroidism   . Left renal mass   . Macular degeneration of both eyes    dry  . PONV (postoperative nausea and vomiting)     Past Surgical History:  Procedure Laterality Date  . APPENDECTOMY    . CYSTOSCOPY WITH RETROGRADE PYELOGRAM, URETEROSCOPY AND STENT PLACEMENT Left 04/01/2016   Procedure: CYSTOSCOPY WITH RETROGRADE PYELOGRAM, left ureter;  Surgeon: Raynelle Bring, MD;  Location: [facility];  Service: Urology;  Laterality: Left;  . HEMORRHOID SURGERY    . HERNIA REPAIR     inguinal-3 on left , 1 on right  . INCISIONAL HERNIA REPAIR N/A 12/23/2016   Procedure: LAPAROSCOPIC VERSUS OPEN LYSIS OF ADHESIONS AND INCISIONAL HERNIA REPAIR WITH MESH;  Surgeon: Ralene Ok, MD;  Location: [place];  Service: General;  Laterality: N/A;  . INSERTION OF MESH N/A 12/23/2016   Procedure: INSERTION OF MESH;  Surgeon: Ralene Ok, MD;  Location: [place];  Service: General;  Laterality: N/A;  . LAPAROSCOPIC NEPHRECTOMY Left 04/18/2016   Procedure: LAPAROSCOPIC RADICAL  LEFT NEPHRECTOMY;  Surgeon: Raynelle Bring, MD;  Location: [facility];  Service: Urology;  Laterality: Left;  . NASAL SINUS SURGERY     left side  . THYROIDECTOMY, PARTIAL     left small amount on left side-for cancer  . TONSILLECTOMY      Social History:  reports that he has never smoked. He has  never used smokeless tobacco. He reports that he does not drink alcohol or use drugs.  Allergies:  Allergies  Allergen Reactions  . Tamsulosin Nausea And Vomiting and Other (See Comments)    Uncomfortable in pelvic region  . Sulfa Antibiotics Rash     (Not in a hospital admission)  Prior to Admission medications   Medication Sig Start Date End Date Taking? Authorizing Provider  atorvastatin (LIPITOR) 20 MG tablet Take 20 mg by mouth at bedtime.     Historical Provider, MD  Coenzyme Q10 (COQ10) 100 MG CAPS Take 1 capsule by mouth daily.    Historical Provider, MD  docusate sodium (COLACE) 100 MG capsule Take 1 capsule (100 mg total) by mouth daily. 12/28/16   Jennifer Chahn-Yang Choi, DO  lansoprazole (PREVACID) 15 MG capsule Take 15 mg by mouth at bedtime.    Historical Provider, MD  Multiple Vitamins-Minerals (PRESERVISION AREDS PO) Take 1 capsule by mouth daily.    Historical Provider, MD  Omega-3 Fatty Acids (FISH OIL) 1200 MG CAPS Take 2 capsules by mouth 2 (two) times daily.    Historical Provider, MD  OVER THE COUNTER MEDICATION Take 1 tablet by mouth daily. Cognium - Brain Vitamin    Historical Provider, MD  oxyCODONE-acetaminophen (ROXICET) 5-325 MG tablet Take 1-2 tablets by mouth every 4 (four) hours as needed. 12/23/16   Ralene Ok, MD  PARoxetine (PAXIL-CR) 25 MG 24 hr tablet Take 25 mg by mouth daily. 02/21/16   Historical Provider, MD  phenol (CHLORASEPTIC) 1.4 % LIQD Use as directed 1 spray in the mouth or throat as needed for throat irritation / pain.    Historical Provider, MD  polyethylene glycol (MIRALAX / GLYCOLAX) packet Take 17 g by mouth daily. 12/28/16   Jennifer Chahn-Yang Choi, DO  SYNTHROID 125 MCG tablet Take 125 mcg by mouth daily.  01/10/16   Historical Provider, MD    Blood pressure 122/73, pulse 63, temperature 98.7 F (37.1 C), temperature source Oral, resp. rate 17, height 6' 3"  (1.905 m), weight 205 lb (93 kg), SpO2 97 %. Physical Exam: General: pleasant, WD/WN white male who is laying in bed in NAD HEENT: head is normocephalic, atraumatic.  Sclera are noninjected.  Mouth is pink and moist Heart: regular, rate, and rhythm.  No obvious murmurs, gallops, or rubs noted.  Palpable pedal pulses bilaterally Lungs: CTAB, no wheezes, rhonchi, or rales noted.  Respiratory effort nonlabored Abd: well healed midline incision, soft, distended and tympanic, few BS hears. Diffusely tender most significantly in the upper  abdomen MS: all 4 extremities are symmetrical with no cyanosis, clubbing, or edema. Skin: warm and dry with no masses, lesions, or rashes Psych: A&Ox3 with an appropriate affect. Neuro: CM 2-12 intact, extremity CSM intact bilaterally, normal speech  Results for orders placed or performed during the hospital encounter of 01/10/17 (from the past 48 hour(s))  Urinalysis, Routine w reflex microscopic     Status: Abnormal   Collection Time: 01/10/17  1:13 AM  Result Value Ref Range   Color, Urine YELLOW YELLOW   APPearance HAZY (A) CLEAR   Specific Gravity, Urine 1.015 1.005 - 1.030   pH 8.0 5.0 - 8.0   Glucose, UA NEGATIVE NEGATIVE mg/dL   Hgb urine dipstick NEGATIVE NEGATIVE   Bilirubin Urine NEGATIVE NEGATIVE   Ketones, ur 15 (A) NEGATIVE mg/dL   Protein, ur NEGATIVE NEGATIVE mg/dL   Nitrite NEGATIVE NEGATIVE   Leukocytes, UA SMALL (A) NEGATIVE  Urinalysis, Microscopic (reflex)     Status:  Abnormal   Collection Time: 01/10/17  1:13 AM  Result Value Ref Range   RBC / HPF NONE SEEN 0 - 5 RBC/hpf   WBC, UA 6-30 0 - 5 WBC/hpf   Bacteria, UA RARE (A) NONE SEEN   Squamous Epithelial / LPF 0-5 (A) NONE SEEN  Lipase, blood     Status: Abnormal   Collection Time: 01/10/17  1:19 AM  Result Value Ref Range   Lipase 67 (H) 11 - 51 U/L  Comprehensive metabolic panel     Status: Abnormal   Collection Time: 01/10/17  1:19 AM  Result Value Ref Range   Sodium 136 135 - 145 mmol/L   Potassium 3.9 3.5 - 5.1 mmol/L   Chloride 104 101 - 111 mmol/L   CO2 22 22 - 32 mmol/L   Glucose, Bld 140 (H) 65 - 99 mg/dL   BUN 15 6 - 20 mg/dL   Creatinine, Ser 1.44 (H) 0.61 - 1.24 mg/dL   Calcium 8.5 (L) 8.9 - 10.3 mg/dL   Total Protein 6.2 (L) 6.5 - 8.1 g/dL   Albumin 3.1 (L) 3.5 - 5.0 g/dL   AST 20 15 - 41 U/L   ALT 30 17 - 63 U/L   Alkaline Phosphatase 85 38 - 126 U/L   Total Bilirubin 1.1 0.3 - 1.2 mg/dL   GFR calc non Af Amer 47 (L) >60 mL/min   GFR calc Af Amer 55 (L) >60 mL/min    Comment:  (NOTE) The eGFR has been calculated using the CKD EPI equation. This calculation has not been validated in all clinical situations. eGFR's persistently <60 mL/min signify possible Chronic Kidney Disease.    Anion gap 10 5 - 15  CBC     Status: Abnormal   Collection Time: 01/10/17  1:19 AM  Result Value Ref Range   WBC 12.3 (H) 4.0 - 10.5 K/uL   RBC 4.25 4.22 - 5.81 MIL/uL   Hemoglobin 12.7 (L) 13.0 - 17.0 g/dL   HCT 38.0 (L) 39.0 - 52.0 %   MCV 89.4 78.0 - 100.0 fL   MCH 29.9 26.0 - 34.0 pg   MCHC 33.4 30.0 - 36.0 g/dL   RDW 14.3 11.5 - 15.5 %   Platelets 284 150 - 400 K/uL  I-Stat CG4 Lactic Acid, ED     Status: None   Collection Time: 01/10/17  4:26 AM  Result Value Ref Range   Lactic Acid, Venous 1.72 0.5 - 1.9 mmol/L   Ct Abdomen Pelvis W Contrast  Result Date: 01/10/2017 CLINICAL DATA:  Abdominal pain. Recent ventral hernia repair and left nephrectomy for renal cell carcinoma EXAM: CT ABDOMEN AND PELVIS WITH CONTRAST TECHNIQUE: Multidetector CT imaging of the abdomen and pelvis was performed using the standard protocol following bolus administration of intravenous contrast. CONTRAST:  100 mL ISOVUE-300 IOPAMIDOL (ISOVUE-300) INJECTION 61% COMPARISON:  December 25, 2016 FINDINGS: Lower chest: There is patchy bibasilar atelectatic change. There are foci of coronary artery calcification. There is a hiatal hernia with contrast refluxing from the stomach into the hiatal hernia. Hepatobiliary: No focal liver lesions are appreciable. The gallbladder is slightly distended with thickened wall. There is no appreciable pericholecystic fluid. No biliary duct dilatation. Pancreas: No pancreatic mass or inflammatory focus. Spleen: Spleen is enlarged, measuring 14.3 x 15.7 x 8.1 cm with a measured splenic volume of 909 cubic cm. No focal splenic lesions or perisplenic fluid evident. Adrenals/Urinary Tract: Adrenals appear normal bilaterally. Left kidney is absent. There is no mass in the  left renal  fossa region. There are small cysts in the right kidney, largest measuring just over 1 cm. No right renal hydronephrosis. No right renal calculus or right ureteral calculus. Urinary bladder is midline with wall thickness within normal limits. Stomach/Bowel: There are multiple loops of dilated small bowel. Stomach is also dilated. There is a transition zone in the midline portion of the mid abdomen at the level of proximal to mid ileum consistent with bowel obstruction. On coronal view, there is the suggestion of a twisting type pattern in this area suggesting internal hernia as the cause of this obstruction. There is no free air or portal venous air. There are multiple sigmoid diverticula without diverticulitis. Vascular/Lymphatic: There is atherosclerotic calcification in the aorta and common iliac arteries. No abdominal aortic aneurysm. Major mesenteric vessels appear patent. No adenopathy is appreciable in the abdomen or pelvis. Reproductive: Prostate is enlarged and impresses on the inferior urinary bladder. There are multiple prostatic calculi. Seminal vesicles also appear mildly prominent. Other: There is moderate ascites throughout the abdomen. Appendix absent. No abscess evident. There is fluid and thickening in the periumbilical region in the area of recent hernia repair. The fluid collection in this area is less well-defined than on study from 3 weeks prior. There may be liquefying hematoma in this area. There is no air in this area to suggest frank abscess, although infected fluid cannot be excluded by CT. Musculoskeletal: There is lumbar levoscoliosis. There is degenerative change in the lumbar spine. There are no blastic or lytic bone lesions. No intramuscular lesions are evident. IMPRESSION: There is small bowel obstruction with the transition zone in the mid abdomen involving the proximal to mid ileum. On the coronal images, there is a suggestion of a twisting pattern in this area suggesting that  there may be internal hernia in the mid abdomen causing this small bowel obstruction. Moderate ascites. Status post left nephrectomy without complicating feature in the left renal fossa region. Postoperative change at the site of a recent ventral hernia. There is moderate fluid and soft tissue thickening in this area. There is a less well-defined fluid collection in this area compared to 3 weeks prior. Suspect resolving liquefying hematoma. Underlying infected fluid in this area cannot be excluded, although there is no air in this area to suggest frank abscess. Gallbladder wall appears thickened. Ascites can cause this pattern. Early cholecystitis could also present in this manner. Close clinical surveillance in this regard advised. Splenomegaly. Focal hiatal hernia with spontaneous gastroesophageal reflux. Aortoiliac atherosclerosis.  Foci of coronary artery calcification. Enlarged prostate containing multiple prostatic calculi. Multiple sigmoid diverticulum without diverticulitis. Electronically Signed   By: Lowella Grip III M.D.   On: 01/10/2017 07:47   Dg Abd Acute W/chest  Result Date: 01/10/2017 CLINICAL DATA:  Abdominal pain and constipation. EXAM: DG ABDOMEN ACUTE W/ 1V CHEST COMPARISON:  CT 12/25/2016 FINDINGS: Streaky left basilar atelectasis. Normal heart size with aortic tortuosity. Small hiatal hernia. No free intra-abdominal air. No significant increased stool burden to suggest constipation. Dilated air-filled small bowel in the left abdomen with air-fluid level. Mild gaseous distention of hepatic flexure. No evidence of radiopaque calculi. Pelvic phleboliths are noted. No acute osseous abnormalities are seen. IMPRESSION: 1. No increased stool burden to suggest constipation. 2. A few dilated small bowel loops in the left mid abdomen with air-fluid levels, can be seen with ileus or early obstruction. 3. Streaky left basilar atelectasis. Electronically Signed   By: Jeb Levering M.D.   On:  01/10/2017  02:14      Assessment/Plan SBO with internal hernia - h/o multiple abdominal surgeries, most recently s/p lap incisional hernia repair with mesh and bilateral musculocutaneous advancement flaps 2/12 by Dr. Rosendo Gros - CT scan shows small bowel obstruction with the transition zone in the mid abdomen involving the proximal to mid ileum; concern for internal hernia in the mid abdomen causing this small bowel obstruction  H/o RCC s/p left nephrectomy 04/18/2016 by Dr. Alinda Money HLD HTN Hypothyroidism   ID - cefotetan perioperative VTE - SCDs FEN - IVF, NPO, NGT  Plan - I saw and evaluated this patient with Dr. Ninfa Linden. Recommend admit to medicine for multiple medical problems. Plan for OR later today for surgical reduction of internal hernia. Please keep NPO for procedure and wait for chemical VTE prophylaxis.  Jerrye Beavers, [facility] 01/10/2017, 9:16 AM Pager: [phone] Consults: [phone] Mon-Fri 7:00 am-4:30 pm Sat-Sun 7:00 am-11:30 am

## 2017-01-11 DIAGNOSIS — F419 Anxiety disorder, unspecified: Secondary | ICD-10-CM | POA: Diagnosis not present

## 2017-01-11 DIAGNOSIS — Z888 Allergy status to other drugs, medicaments and biological substances status: Secondary | ICD-10-CM | POA: Diagnosis not present

## 2017-01-11 DIAGNOSIS — N183 Chronic kidney disease, stage 3 (moderate): Secondary | ICD-10-CM | POA: Diagnosis not present

## 2017-01-11 DIAGNOSIS — H3530 Unspecified macular degeneration: Secondary | ICD-10-CM | POA: Diagnosis not present

## 2017-01-11 DIAGNOSIS — H269 Unspecified cataract: Secondary | ICD-10-CM | POA: Diagnosis not present

## 2017-01-11 DIAGNOSIS — K219 Gastro-esophageal reflux disease without esophagitis: Secondary | ICD-10-CM | POA: Diagnosis not present

## 2017-01-11 DIAGNOSIS — R1084 Generalized abdominal pain: Secondary | ICD-10-CM | POA: Diagnosis not present

## 2017-01-11 DIAGNOSIS — K430 Incisional hernia with obstruction, without gangrene: Secondary | ICD-10-CM | POA: Diagnosis not present

## 2017-01-11 DIAGNOSIS — E039 Hypothyroidism, unspecified: Secondary | ICD-10-CM

## 2017-01-11 DIAGNOSIS — K660 Peritoneal adhesions (postprocedural) (postinfection): Secondary | ICD-10-CM | POA: Diagnosis not present

## 2017-01-11 DIAGNOSIS — I159 Secondary hypertension, unspecified: Secondary | ICD-10-CM | POA: Diagnosis not present

## 2017-01-11 DIAGNOSIS — Z79899 Other long term (current) drug therapy: Secondary | ICD-10-CM | POA: Diagnosis not present

## 2017-01-11 DIAGNOSIS — Z905 Acquired absence of kidney: Secondary | ICD-10-CM | POA: Diagnosis not present

## 2017-01-11 DIAGNOSIS — I129 Hypertensive chronic kidney disease with stage 1 through stage 4 chronic kidney disease, or unspecified chronic kidney disease: Secondary | ICD-10-CM | POA: Diagnosis not present

## 2017-01-11 DIAGNOSIS — K56609 Unspecified intestinal obstruction, unspecified as to partial versus complete obstruction: Secondary | ICD-10-CM | POA: Diagnosis not present

## 2017-01-11 DIAGNOSIS — N179 Acute kidney failure, unspecified: Secondary | ICD-10-CM | POA: Diagnosis not present

## 2017-01-11 DIAGNOSIS — E785 Hyperlipidemia, unspecified: Secondary | ICD-10-CM | POA: Diagnosis not present

## 2017-01-11 DIAGNOSIS — Z882 Allergy status to sulfonamides status: Secondary | ICD-10-CM | POA: Diagnosis not present

## 2017-01-11 LAB — CBC
HEMATOCRIT: 36.1 % — AB (ref 39.0–52.0)
HEMOGLOBIN: 11.7 g/dL — AB (ref 13.0–17.0)
MCH: 29.5 pg (ref 26.0–34.0)
MCHC: 32.4 g/dL (ref 30.0–36.0)
MCV: 90.9 fL (ref 78.0–100.0)
Platelets: 271 10*3/uL (ref 150–400)
RBC: 3.97 MIL/uL — AB (ref 4.22–5.81)
RDW: 14.7 % (ref 11.5–15.5)
WBC: 8.9 10*3/uL (ref 4.0–10.5)

## 2017-01-11 LAB — BASIC METABOLIC PANEL
ANION GAP: 7 (ref 5–15)
BUN: 11 mg/dL (ref 6–20)
CO2: 23 mmol/L (ref 22–32)
Calcium: 8.2 mg/dL — ABNORMAL LOW (ref 8.9–10.3)
Chloride: 106 mmol/L (ref 101–111)
Creatinine, Ser: 1.27 mg/dL — ABNORMAL HIGH (ref 0.61–1.24)
GFR, EST NON AFRICAN AMERICAN: 55 mL/min — AB (ref >60)
GLUCOSE: 158 mg/dL — AB (ref 65–99)
POTASSIUM: 4.6 mmol/L (ref 3.5–5.1)
Sodium: 136 mmol/L (ref 135–145)

## 2017-01-11 LAB — TSH: TSH: 9.868 u[IU]/mL — ABNORMAL HIGH (ref 0.350–4.500)

## 2017-01-11 MED ORDER — PHENOL 1.4 % MT LIQD
1.0000 | OROMUCOSAL | Status: DC | PRN
  Administered 2017-01-11: 1 via OROMUCOSAL
  Filled 2017-01-11: qty 177

## 2017-01-11 MED ORDER — LEVOTHYROXINE SODIUM 100 MCG IV SOLR
60.0000 ug | Freq: Every day | INTRAVENOUS | Status: DC
  Administered 2017-01-11: 60 ug via INTRAVENOUS
  Filled 2017-01-11: qty 5

## 2017-01-11 NOTE — Progress Notes (Signed)
PROGRESS NOTE  Eric Suarez  [id number] DOB: 1946/04/24 DOA: 01/10/2017 PCP: Dwan Bolt, MD Outpatient Specialists:  Subjective: No complaints this morning other than some throat pain  Brief Narrative:  Eric Suarez is a 71 y.o. male  the past medical history significant for anxiety, cancer, coronary artery disease, reflux, hypothyroidism, hypertension who presented to the emergency room with abdominal pain. Of note patient had ventral hernia repair in February. Patient states about 2 weeks after the surgery he began to have abdominal discomfort. He describes the discomfort as a strong ache. Began acutely worse yesterday. Patient has some nausea but no vomiting. At its worst the pain was rated as greater than 10 out of 10. Patient denies any abdominal trauma. Patient's last bowel movement was the day before admission with the addition of enema. Patient has not passed any flatus in the last 12 hours. Patient has no appetite.   Assessment & Plan:   Principal Problem:   SBO (small bowel obstruction) Active Problems:   CKD (chronic kidney disease) stage 3, GFR 30-59 ml/min   Hypothyroidism   HTN (hypertension)   Post-operative complication   Small bowel obstruction This is secondary to herniation of the small bowel into the retrorectus space, this is released by surgery. Had upper scopic surgery on 3/2 done by Dr. Rosendo Gros When necessary Dilaudid and Toradol for the pain. Still has the NG tube, ambulate as tolerated  CKD stage 2-3, status post left sided nephrectomy Creatinine at baseline, currently at 1.27. Check BMP in a.m.  Hypothyroidism Check TSH, Synthroid IV, resume oral 1 when able to take orally.  Hypertension When necessary hydralazine 10 mg IV as needed for severe blood pressure  GERD PPI bid   DVT prophylaxis:  Code Status: Full Code Family Communication:  Disposition Plan:  Diet: Diet NPO time specified  Consultants:   Gen.  surgery  Procedures:   Laparoscopic release of internal incarcerated hernia  Antimicrobials:   Perioperative.  Objective: Vitals:   01/10/17 1900 01/10/17 1915 01/10/17 2008 01/11/17 0500  BP: 118/66 121/73 115/74 116/65  Pulse: 87 84 87 67  Resp: 15 14 15 16   Temp: 98.7 F (37.1 C)  98.9 F (37.2 C) 98.5 F (36.9 C)  TempSrc:   Oral Oral  SpO2: 98% 98% 98% 100%  Weight:      Height:        Intake/Output Summary (Last 24 hours) at 01/11/17 1241 Last data filed at 01/11/17 1100  Gross per 24 hour  Intake             1775 ml  Output             2080 ml  Net             -305 ml   Filed Weights   01/10/17 0112  Weight: 93 kg (205 lb)    Examination: General exam: Appears calm and comfortable  Respiratory system: Clear to auscultation. Respiratory effort normal. Cardiovascular system: S1 & S2 heard, RRR. No JVD, murmurs, rubs, gallops or clicks. No pedal edema. Gastrointestinal system: Abdomen is nondistended, soft and nontender. No organomegaly or masses felt. Normal bowel sounds heard. Central nervous system: Alert and oriented. No focal neurological deficits. Extremities: Symmetric 5 x 5 power. Skin: No rashes, lesions or ulcers Psychiatry: Judgement and insight appear normal. Mood & affect appropriate.   Data Reviewed: I have personally reviewed following labs and imaging studies  CBC:  Recent Labs Lab 01/10/17 0119 01/11/17 0459  WBC  12.3* 8.9  HGB 12.7* 11.7*  HCT 38.0* 36.1*  MCV 89.4 90.9  PLT 284 [id number]   Basic Metabolic Panel:  Recent Labs Lab 01/10/17 0119 01/10/17 1116 01/11/17 0459  NA 136  --  136  K 3.9  --  4.6  CL 104  --  106  CO2 22  --  23  GLUCOSE 140*  --  158*  BUN 15  --  11  CREATININE 1.44*  --  1.27*  CALCIUM 8.5*  --  8.2*  MG  --  2.0  --   PHOS  --  4.0  --    GFR: Estimated Creatinine Clearance: 63.8 mL/min (by C-G formula based on SCr of 1.27 mg/dL (H)). Liver Function Tests:  Recent Labs Lab 01/10/17 0119   AST 20  ALT 30  ALKPHOS 85  BILITOT 1.1  PROT 6.2*  ALBUMIN 3.1*    Recent Labs Lab 01/10/17 0119  LIPASE 67*   No results for input(s): AMMONIA in the last 168 hours. Coagulation Profile:  Recent Labs Lab 01/10/17 1116  INR 1.27   Cardiac Enzymes: No results for input(s): CKTOTAL, CKMB, CKMBINDEX, TROPONINI in the last 168 hours. BNP (last 3 results) No results for input(s): PROBNP in the last 8760 hours. HbA1C: No results for input(s): HGBA1C in the last 72 hours. CBG: No results for input(s): GLUCAP in the last 168 hours. Lipid Profile: No results for input(s): CHOL, HDL, LDLCALC, TRIG, CHOLHDL, LDLDIRECT in the last 72 hours. Thyroid Function Tests: No results for input(s): TSH, T4TOTAL, FREET4, T3FREE, THYROIDAB in the last 72 hours. Anemia Panel: No results for input(s): VITAMINB12, FOLATE, FERRITIN, TIBC, IRON, RETICCTPCT in the last 72 hours. Urine analysis:    Component Value Date/Time   COLORURINE YELLOW 01/10/2017 0113   APPEARANCEUR HAZY (A) 01/10/2017 0113   LABSPEC 1.015 01/10/2017 0113   PHURINE 8.0 01/10/2017 0113   GLUCOSEU NEGATIVE 01/10/2017 0113   HGBUR NEGATIVE 01/10/2017 0113   BILIRUBINUR NEGATIVE 01/10/2017 0113   KETONESUR 15 (A) 01/10/2017 0113   PROTEINUR NEGATIVE 01/10/2017 0113   NITRITE NEGATIVE 01/10/2017 0113   LEUKOCYTESUR SMALL (A) 01/10/2017 0113   Sepsis Labs: @LABRCNTIP (procalcitonin:4,lacticidven:4)  )No results found for this or any previous visit (from the past 240 hour(s)).   Invalid input(s): PROCALCITONIN, LACTICACIDVEN   Radiology Studies: Ct Abdomen Pelvis W Contrast  Result Date: 01/10/2017 CLINICAL DATA:  Abdominal pain. Recent ventral hernia repair and left nephrectomy for renal cell carcinoma EXAM: CT ABDOMEN AND PELVIS WITH CONTRAST TECHNIQUE: Multidetector CT imaging of the abdomen and pelvis was performed using the standard protocol following bolus administration of intravenous contrast. CONTRAST:  100  mL ISOVUE-300 IOPAMIDOL (ISOVUE-300) INJECTION 61% COMPARISON:  December 25, 2016 FINDINGS: Lower chest: There is patchy bibasilar atelectatic change. There are foci of coronary artery calcification. There is a hiatal hernia with contrast refluxing from the stomach into the hiatal hernia. Hepatobiliary: No focal liver lesions are appreciable. The gallbladder is slightly distended with thickened wall. There is no appreciable pericholecystic fluid. No biliary duct dilatation. Pancreas: No pancreatic mass or inflammatory focus. Spleen: Spleen is enlarged, measuring 14.3 x 15.7 x 8.1 cm with a measured splenic volume of 909 cubic cm. No focal splenic lesions or perisplenic fluid evident. Adrenals/Urinary Tract: Adrenals appear normal bilaterally. Left kidney is absent. There is no mass in the left renal fossa region. There are small cysts in the right kidney, largest measuring just over 1 cm. No right renal hydronephrosis. No right renal calculus or  right ureteral calculus. Urinary bladder is midline with wall thickness within normal limits. Stomach/Bowel: There are multiple loops of dilated small bowel. Stomach is also dilated. There is a transition zone in the midline portion of the mid abdomen at the level of proximal to mid ileum consistent with bowel obstruction. On coronal view, there is the suggestion of a twisting type pattern in this area suggesting internal hernia as the cause of this obstruction. There is no free air or portal venous air. There are multiple sigmoid diverticula without diverticulitis. Vascular/Lymphatic: There is atherosclerotic calcification in the aorta and common iliac arteries. No abdominal aortic aneurysm. Major mesenteric vessels appear patent. No adenopathy is appreciable in the abdomen or pelvis. Reproductive: Prostate is enlarged and impresses on the inferior urinary bladder. There are multiple prostatic calculi. Seminal vesicles also appear mildly prominent. Other: There is moderate  ascites throughout the abdomen. Appendix absent. No abscess evident. There is fluid and thickening in the periumbilical region in the area of recent hernia repair. The fluid collection in this area is less well-defined than on study from 3 weeks prior. There may be liquefying hematoma in this area. There is no air in this area to suggest frank abscess, although infected fluid cannot be excluded by CT. Musculoskeletal: There is lumbar levoscoliosis. There is degenerative change in the lumbar spine. There are no blastic or lytic bone lesions. No intramuscular lesions are evident. IMPRESSION: There is small bowel obstruction with the transition zone in the mid abdomen involving the proximal to mid ileum. On the coronal images, there is a suggestion of a twisting pattern in this area suggesting that there may be internal hernia in the mid abdomen causing this small bowel obstruction. Moderate ascites. Status post left nephrectomy without complicating feature in the left renal fossa region. Postoperative change at the site of a recent ventral hernia. There is moderate fluid and soft tissue thickening in this area. There is a less well-defined fluid collection in this area compared to 3 weeks prior. Suspect resolving liquefying hematoma. Underlying infected fluid in this area cannot be excluded, although there is no air in this area to suggest frank abscess. Gallbladder wall appears thickened. Ascites can cause this pattern. Early cholecystitis could also present in this manner. Close clinical surveillance in this regard advised. Splenomegaly. Focal hiatal hernia with spontaneous gastroesophageal reflux. Aortoiliac atherosclerosis.  Foci of coronary artery calcification. Enlarged prostate containing multiple prostatic calculi. Multiple sigmoid diverticulum without diverticulitis. Electronically Signed   By: Lowella Grip III M.D.   On: 01/10/2017 07:47   Dg Abd Acute W/chest  Result Date: 01/10/2017 CLINICAL DATA:   Abdominal pain and constipation. EXAM: DG ABDOMEN ACUTE W/ 1V CHEST COMPARISON:  CT 12/25/2016 FINDINGS: Streaky left basilar atelectasis. Normal heart size with aortic tortuosity. Small hiatal hernia. No free intra-abdominal air. No significant increased stool burden to suggest constipation. Dilated air-filled small bowel in the left abdomen with air-fluid level. Mild gaseous distention of hepatic flexure. No evidence of radiopaque calculi. Pelvic phleboliths are noted. No acute osseous abnormalities are seen. IMPRESSION: 1. No increased stool burden to suggest constipation. 2. A few dilated small bowel loops in the left mid abdomen with air-fluid levels, can be seen with ileus or early obstruction. 3. Streaky left basilar atelectasis. Electronically Signed   By: Jeb Levering M.D.   On: 01/10/2017 02:14        Scheduled Meds: . enoxaparin (LOVENOX) injection  40 mg Subcutaneous Q24H  . pantoprazole (PROTONIX) IV  40 mg Intravenous  Q12H   Continuous Infusions: . dextrose 5 % and 0.45 % NaCl with KCl 20 mEq/L 125 mL/hr at 01/11/17 1108     LOS: 0 days    Time spent: 35 minutes    William Schake A, MD [facility] Pager [phone]  If 7PM-7AM, please contact night-coverage [email or web address] Password [email or web address] 01/11/2017, 12:41 PM

## 2017-01-11 NOTE — Progress Notes (Signed)
1 Day Post-Op  Subjective: Pt POD 1 from takeback laparoscopic repair 2-3 weeks after hernia repair.  Had SBO and bowel in retrorectus space.  His only complaint this AM is throat pain from NGT.  He denies n/v.  No flatus yet.  Pain controlled.    Objective: Vital signs in last 24 hours: Temp:  [98.5 F (36.9 C)-98.9 F (37.2 C)] 98.5 F (36.9 C) (03/03 0500) Pulse Rate:  [63-98] 67 (03/03 0500) Resp:  [13-22] 16 (03/03 0500) BP: (111-131)/(56-88) 116/65 (03/03 0500) SpO2:  [91 %-100 %] 100 % (03/03 0500) Last BM Date: 01/09/17  Intake/Output from previous day: 03/02 0701 - 03/03 0700 In: 1775 [I.V.:1775] Out: 1280 [Urine:750; Emesis/NG output:350; Drains:130; Blood:50] Intake/Output this shift: No intake/output data recorded.  General appearance: alert, cooperative and no distress Resp: breathing comfortably GI: soft, minimally distended.  dressings wtih minimal dried blood laterally.    Lab Results:   Recent Labs  01/10/17 0119 01/11/17 0459  WBC 12.3* 8.9  HGB 12.7* 11.7*  HCT 38.0* 36.1*  PLT 284 271   BMET  Recent Labs  01/10/17 0119 01/11/17 0459  NA 136 136  K 3.9 4.6  CL 104 106  CO2 22 23  GLUCOSE 140* 158*  BUN 15 11  CREATININE 1.44* 1.27*  CALCIUM 8.5* 8.2*   PT/INR  Recent Labs  01/10/17 1116  LABPROT 15.9*  INR 1.27   ABG No results for input(s): PHART, HCO3 in the last 72 hours.  Invalid input(s): PCO2, PO2  Studies/Results: Ct Abdomen Pelvis W Contrast  Result Date: 01/10/2017 CLINICAL DATA:  Abdominal pain. Recent ventral hernia repair and left nephrectomy for renal cell carcinoma EXAM: CT ABDOMEN AND PELVIS WITH CONTRAST TECHNIQUE: Multidetector CT imaging of the abdomen and pelvis was performed using the standard protocol following bolus administration of intravenous contrast. CONTRAST:  100 mL ISOVUE-300 IOPAMIDOL (ISOVUE-300) INJECTION 61% COMPARISON:  December 25, 2016 FINDINGS: Lower chest: There is patchy bibasilar  atelectatic change. There are foci of coronary artery calcification. There is a hiatal hernia with contrast refluxing from the stomach into the hiatal hernia. Hepatobiliary: No focal liver lesions are appreciable. The gallbladder is slightly distended with thickened wall. There is no appreciable pericholecystic fluid. No biliary duct dilatation. Pancreas: No pancreatic mass or inflammatory focus. Spleen: Spleen is enlarged, measuring 14.3 x 15.7 x 8.1 cm with a measured splenic volume of 909 cubic cm. No focal splenic lesions or perisplenic fluid evident. Adrenals/Urinary Tract: Adrenals appear normal bilaterally. Left kidney is absent. There is no mass in the left renal fossa region. There are small cysts in the right kidney, largest measuring just over 1 cm. No right renal hydronephrosis. No right renal calculus or right ureteral calculus. Urinary bladder is midline with wall thickness within normal limits. Stomach/Bowel: There are multiple loops of dilated small bowel. Stomach is also dilated. There is a transition zone in the midline portion of the mid abdomen at the level of proximal to mid ileum consistent with bowel obstruction. On coronal view, there is the suggestion of a twisting type pattern in this area suggesting internal hernia as the cause of this obstruction. There is no free air or portal venous air. There are multiple sigmoid diverticula without diverticulitis. Vascular/Lymphatic: There is atherosclerotic calcification in the aorta and common iliac arteries. No abdominal aortic aneurysm. Major mesenteric vessels appear patent. No adenopathy is appreciable in the abdomen or pelvis. Reproductive: Prostate is enlarged and impresses on the inferior urinary bladder. There are multiple prostatic calculi.  Seminal vesicles also appear mildly prominent. Other: There is moderate ascites throughout the abdomen. Appendix absent. No abscess evident. There is fluid and thickening in the periumbilical region in  the area of recent hernia repair. The fluid collection in this area is less well-defined than on study from 3 weeks prior. There may be liquefying hematoma in this area. There is no air in this area to suggest frank abscess, although infected fluid cannot be excluded by CT. Musculoskeletal: There is lumbar levoscoliosis. There is degenerative change in the lumbar spine. There are no blastic or lytic bone lesions. No intramuscular lesions are evident. IMPRESSION: There is small bowel obstruction with the transition zone in the mid abdomen involving the proximal to mid ileum. On the coronal images, there is a suggestion of a twisting pattern in this area suggesting that there may be internal hernia in the mid abdomen causing this small bowel obstruction. Moderate ascites. Status post left nephrectomy without complicating feature in the left renal fossa region. Postoperative change at the site of a recent ventral hernia. There is moderate fluid and soft tissue thickening in this area. There is a less well-defined fluid collection in this area compared to 3 weeks prior. Suspect resolving liquefying hematoma. Underlying infected fluid in this area cannot be excluded, although there is no air in this area to suggest frank abscess. Gallbladder wall appears thickened. Ascites can cause this pattern. Early cholecystitis could also present in this manner. Close clinical surveillance in this regard advised. Splenomegaly. Focal hiatal hernia with spontaneous gastroesophageal reflux. Aortoiliac atherosclerosis.  Foci of coronary artery calcification. Enlarged prostate containing multiple prostatic calculi. Multiple sigmoid diverticulum without diverticulitis. Electronically Signed   By: Lowella Grip III M.D.   On: 01/10/2017 07:47   Dg Abd Acute W/chest  Result Date: 01/10/2017 CLINICAL DATA:  Abdominal pain and constipation. EXAM: DG ABDOMEN ACUTE W/ 1V CHEST COMPARISON:  CT 12/25/2016 FINDINGS: Streaky left basilar  atelectasis. Normal heart size with aortic tortuosity. Small hiatal hernia. No free intra-abdominal air. No significant increased stool burden to suggest constipation. Dilated air-filled small bowel in the left abdomen with air-fluid level. Mild gaseous distention of hepatic flexure. No evidence of radiopaque calculi. Pelvic phleboliths are noted. No acute osseous abnormalities are seen. IMPRESSION: 1. No increased stool burden to suggest constipation. 2. A few dilated small bowel loops in the left mid abdomen with air-fluid levels, can be seen with ileus or early obstruction. 3. Streaky left basilar atelectasis. Electronically Signed   By: Jeb Levering M.D.   On: 01/10/2017 02:14    Anti-infectives: Anti-infectives    Start     Dose/Rate Route Frequency Ordered Stop   01/10/17 1415  cefoTEtan (CEFOTAN) 2 g in dextrose 5 % 50 mL IVPB     2 g 100 mL/hr over 30 Minutes Intravenous To Surgery 01/10/17 1409 01/10/17 1615   01/10/17 1352  cefoTEtan in Dextrose 5% (CEFOTAN) 2-2.08 GM-% IVPB    Comments:  Merryl Hacker   : cabinet override      01/10/17 1352 01/11/17 0159   01/10/17 1015  cefoTEtan (CEFOTAN) 2 g in dextrose 5 % 50 mL IVPB  Status:  Discontinued     2 g 100 mL/hr over 30 Minutes Intravenous On call to O.R. 01/10/17 1002 01/10/17 1411      Assessment/Plan: s/p Procedure(s): LAPAROSCOPY DIAGNOSTIC , POSSIBLE LAPAROTOMY (N/A) INSERTION OF MESH (N/A) Continue foley due to urinary output monitoring  Acute kidney injury - Creatinine coming down.  Recheck in AM.  Leave NGT for now.  Still quite bilious.   Pain control with PRN toradol and dilaudid.  Has not gotten toradol.  Hold for AKI.     LOS: 0 days    Eric Suarez 01/11/2017

## 2017-01-12 LAB — BASIC METABOLIC PANEL
Anion gap: 4 — ABNORMAL LOW (ref 5–15)
BUN: 9 mg/dL (ref 6–20)
CHLORIDE: 111 mmol/L (ref 101–111)
CO2: 24 mmol/L (ref 22–32)
CREATININE: 1.31 mg/dL — AB (ref 0.61–1.24)
Calcium: 8 mg/dL — ABNORMAL LOW (ref 8.9–10.3)
GFR calc Af Amer: >60 mL/min (ref >60)
GFR, EST NON AFRICAN AMERICAN: 53 mL/min — AB (ref >60)
Glucose, Bld: 125 mg/dL — ABNORMAL HIGH (ref 65–99)
Potassium: 4 mmol/L (ref 3.5–5.1)
SODIUM: 139 mmol/L (ref 135–145)

## 2017-01-12 MED ORDER — KCL IN DEXTROSE-NACL 20-5-0.45 MEQ/L-%-% IV SOLN
INTRAVENOUS | Status: AC
  Administered 2017-01-12: 11:00:00 via INTRAVENOUS
  Filled 2017-01-12: qty 1000

## 2017-01-12 MED ORDER — LEVOTHYROXINE SODIUM 100 MCG IV SOLR
75.0000 ug | Freq: Every day | INTRAVENOUS | Status: DC
  Administered 2017-01-12: 75 ug via INTRAVENOUS
  Filled 2017-01-12 (×2): qty 5

## 2017-01-12 MED ORDER — BISACODYL 10 MG RE SUPP
10.0000 mg | Freq: Two times a day (BID) | RECTAL | Status: AC
  Administered 2017-01-12 (×2): 10 mg via RECTAL
  Filled 2017-01-12 (×2): qty 1

## 2017-01-12 NOTE — Progress Notes (Signed)
2 Days Post-Op  Subjective: Stable and alert.  Appears comfortable  POD # 20/2 - elective laparoscopic incisional hernia repair with take back for SBO due to nonischemic small bowel entrapment in retrorectus space and further mesh inlay.  Passing flatus but no stool.  Denies nausea or bloating or pain.  Ambulating in halls. NG output bilious but very low volume-no more than 200 mL in 24 hours. Urine output up  To 1550 mL per 24 hours  Lab work this morning shows potassium 4.0.  Creatinine 1.31.  BUN 9.  Glucose 125.  Objective: Vital signs in last 24 hours: Temp:  [98.2 F (36.8 C)-98.9 F (37.2 C)] 98.9 F (37.2 C) (03/04 0535) Pulse Rate:  [67-87] 87 (03/04 0535) Resp:  [16-19] 17 (03/04 0535) BP: (107-116)/(60-74) 107/63 (03/04 0535) SpO2:  [100 %] 100 % (03/04 0535) Last BM Date: 01/09/17  Intake/Output from previous day: 03/03 0701 - 03/04 0700 In: 1139.6 [I.V.:1139.6] Out: 1810 [Urine:1550; Emesis/NG output:200; Drains:60] Intake/Output this shift: Total I/O In: -  Out: 450 [Urine:350; Emesis/NG output:100]    Exam: General appearance: alert, cooperative and no distress.  Wife present in room. Resp: breathing comfortably.  Lungs clear to auscultation bilaterally  GI: soft, some bowel sounds present.  Non- distended.   minimal tenderness around drain dressings wtih minimal dried blood laterally.   JP low volume serosanguineous fluid   Lab Results:  Results for orders placed or performed during the hospital encounter of 01/10/17 (from the past 24 hour(s))  TSH     Status: Abnormal   Collection Time: 01/11/17  1:03 PM  Result Value Ref Range   TSH 9.868 (H) 0.350 - 4.500 uIU/mL  Basic metabolic panel     Status: Abnormal   Collection Time: 01/12/17  3:57 AM  Result Value Ref Range   Sodium 139 135 - 145 mmol/L   Potassium 4.0 3.5 - 5.1 mmol/L   Chloride 111 101 - 111 mmol/L   CO2 24 22 - 32 mmol/L   Glucose, Bld 125 (H) 65 - 99 mg/dL   BUN 9 6 - 20 mg/dL   Creatinine, Ser 1.31 (H) 0.61 - 1.24 mg/dL   Calcium 8.0 (L) 8.9 - 10.3 mg/dL   GFR calc non Af Amer 53 (L) >60 mL/min   GFR calc Af Amer >60 >60 mL/min   Anion gap 4 (L) 5 - 15     Studies/Results: No results found.  . bisacodyl  10 mg Rectal BID  . enoxaparin (LOVENOX) injection  40 mg Subcutaneous Q24H  . levothyroxine  60 mcg Intravenous Daily  . pantoprazole (PROTONIX) IV  40 mg Intravenous Q12H     Assessment/Plan: s/p Procedure(s): LAPAROSCOPY DIAGNOSTIC , POSSIBLE LAPAROTOMY INSERTION OF MESH  Clamping NG and allow clear liquids cautiously Dulcolax suppository Ambulate more DC Foley Pain control very adequate.  @PROBHOSP @  LOS: 1 day    Eric Suarez M 01/12/2017  . .prob

## 2017-01-12 NOTE — Progress Notes (Signed)
PROGRESS NOTE  Eric Suarez  [id number] DOB: 10/04/1946 DOA: 01/10/2017 PCP: Dwan Bolt, MD Outpatient Specialists:  Subjective: Had a bowel movement and passed gas this morning, continued to ambulate. Pain is controlled.  Brief Narrative:  Eric Suarez is a 71 y.o. male  the past medical history significant for anxiety, cancer, coronary artery disease, reflux, hypothyroidism, hypertension who presented to the emergency room with abdominal pain. Of note patient had ventral hernia repair in February. Patient states about 2 weeks after the surgery he began to have abdominal discomfort. He describes the discomfort as a strong ache. Began acutely worse yesterday. Patient has some nausea but no vomiting. At its worst the pain was rated as greater than 10 out of 10. Patient denies any abdominal trauma. Patient's last bowel movement was the day before admission with the addition of enema. Patient has not passed any flatus in the last 12 hours. Patient has no appetite.   Assessment & Plan:   Principal Problem:   SBO (small bowel obstruction) Active Problems:   CKD (chronic kidney disease) stage 3, GFR 30-59 ml/min   Hypothyroidism   HTN (hypertension)   Post-operative complication   Small bowel obstruction This is secondary to herniation of the small bowel into the retrorectus space, this is released by surgery. Had laparoscopic surgery on 3/2 done by Dr. Rosendo Gros When necessary Dilaudid and Toradol for the pain, avoid Toradol as he has CKD. NG tube clamped, patient started on clear liquids  CKD stage 2-3, status post left sided nephrectomy Creatinine at baseline, currently at 1.27. Check BMP in a.m.  Hypothyroidism TSH is 9.868, he is on 125 g of Synthroid this will be increased to 150.  Hypertension When necessary hydralazine 10 mg IV as needed for severe blood pressure  GERD PPI bid   DVT prophylaxis: Lovenox Code Status: Full Code Family  Communication:  Disposition Plan:  Diet: Diet clear liquid Room service appropriate? Yes; Fluid consistency: Thin  Consultants:   Gen. surgery  Procedures:   Laparoscopic release of internal incarcerated hernia  Antimicrobials:   Perioperative.  Objective: Vitals:   01/11/17 0500 01/11/17 1335 01/11/17 2110 01/12/17 0535  BP: 116/65 116/74 115/60 107/63  Pulse: 67 67 70 87  Resp: 16 16 19 17   Temp: 98.5 F (36.9 C) 98.6 F (37 C) 98.2 F (36.8 C) 98.9 F (37.2 C)  TempSrc: Oral Oral Oral Oral  SpO2: 100% 100% 100% 100%  Weight:      Height:        Intake/Output Summary (Last 24 hours) at 01/12/17 0857 Last data filed at 01/12/17 0753  Gross per 24 hour  Intake          2339.58 ml  Output             1690 ml  Net           649.58 ml   Filed Weights   01/10/17 0112  Weight: 93 kg (205 lb)    Examination: General exam: Appears calm and comfortable  Respiratory system: Clear to auscultation. Respiratory effort normal. Cardiovascular system: S1 & S2 heard, RRR. No JVD, murmurs, rubs, gallops or clicks. No pedal edema. Gastrointestinal system: Abdomen is nondistended, soft and nontender. No organomegaly or masses felt. Normal bowel sounds heard. Central nervous system: Alert and oriented. No focal neurological deficits. Extremities: Symmetric 5 x 5 power. Skin: No rashes, lesions or ulcers Psychiatry: Judgement and insight appear normal. Mood & affect appropriate.   Data Reviewed: I have  personally reviewed following labs and imaging studies  CBC:  Recent Labs Lab 01/10/17 0119 01/11/17 0459  WBC 12.3* 8.9  HGB 12.7* 11.7*  HCT 38.0* 36.1*  MCV 89.4 90.9  PLT 284 [id number]   Basic Metabolic Panel:  Recent Labs Lab 01/10/17 0119 01/10/17 1116 01/11/17 0459 01/12/17 0357  NA 136  --  136 139  K 3.9  --  4.6 4.0  CL 104  --  106 111  CO2 22  --  23 24  GLUCOSE 140*  --  158* 125*  BUN 15  --  11 9  CREATININE 1.44*  --  1.27* 1.31*  CALCIUM 8.5*   --  8.2* 8.0*  MG  --  2.0  --   --   PHOS  --  4.0  --   --    GFR: Estimated Creatinine Clearance: 61.8 mL/min (by C-G formula based on SCr of 1.31 mg/dL (H)). Liver Function Tests:  Recent Labs Lab 01/10/17 0119  AST 20  ALT 30  ALKPHOS 85  BILITOT 1.1  PROT 6.2*  ALBUMIN 3.1*    Recent Labs Lab 01/10/17 0119  LIPASE 67*   No results for input(s): AMMONIA in the last 168 hours. Coagulation Profile:  Recent Labs Lab 01/10/17 1116  INR 1.27   Cardiac Enzymes: No results for input(s): CKTOTAL, CKMB, CKMBINDEX, TROPONINI in the last 168 hours. BNP (last 3 results) No results for input(s): PROBNP in the last 8760 hours. HbA1C: No results for input(s): HGBA1C in the last 72 hours. CBG: No results for input(s): GLUCAP in the last 168 hours. Lipid Profile: No results for input(s): CHOL, HDL, LDLCALC, TRIG, CHOLHDL, LDLDIRECT in the last 72 hours. Thyroid Function Tests:  Recent Labs  01/11/17 1303  TSH 9.868*   Anemia Panel: No results for input(s): VITAMINB12, FOLATE, FERRITIN, TIBC, IRON, RETICCTPCT in the last 72 hours. Urine analysis:    Component Value Date/Time   COLORURINE YELLOW 01/10/2017 0113   APPEARANCEUR HAZY (A) 01/10/2017 0113   LABSPEC 1.015 01/10/2017 0113   PHURINE 8.0 01/10/2017 0113   GLUCOSEU NEGATIVE 01/10/2017 0113   HGBUR NEGATIVE 01/10/2017 0113   BILIRUBINUR NEGATIVE 01/10/2017 0113   KETONESUR 15 (A) 01/10/2017 0113   PROTEINUR NEGATIVE 01/10/2017 0113   NITRITE NEGATIVE 01/10/2017 0113   LEUKOCYTESUR SMALL (A) 01/10/2017 0113   Sepsis Labs: @LABRCNTIP (procalcitonin:4,lacticidven:4)  )No results found for this or any previous visit (from the past 240 hour(s)).   Invalid input(s): PROCALCITONIN, [place]   Radiology Studies: No results found.      Scheduled Meds: . bisacodyl  10 mg Rectal BID  . enoxaparin (LOVENOX) injection  40 mg Subcutaneous Q24H  . levothyroxine  60 mcg Intravenous Daily  .  pantoprazole (PROTONIX) IV  40 mg Intravenous Q12H   Continuous Infusions: . dextrose 5 % and 0.45 % NaCl with KCl 20 mEq/L 125 mL/hr at 01/12/17 0600     LOS: 1 day    Time spent: 35 minutes    Ioma Chismar A, MD [facility] Pager [phone]  If 7PM-7AM, please contact night-coverage [email or web address] Password [facility] 01/12/2017, 8:57 AM

## 2017-01-13 ENCOUNTER — Encounter (HOSPITAL_COMMUNITY): Payer: Self-pay | Admitting: General Surgery

## 2017-01-13 DIAGNOSIS — I159 Secondary hypertension, unspecified: Secondary | ICD-10-CM

## 2017-01-13 LAB — CBC
HCT: 36.5 % — ABNORMAL LOW (ref 39.0–52.0)
Hemoglobin: 11.9 g/dL — ABNORMAL LOW (ref 13.0–17.0)
MCH: 29.7 pg (ref 26.0–34.0)
MCHC: 32.6 g/dL (ref 30.0–36.0)
MCV: 91 fL (ref 78.0–100.0)
Platelets: 222 10*3/uL (ref 150–400)
RBC: 4.01 MIL/uL — ABNORMAL LOW (ref 4.22–5.81)
RDW: 15 % (ref 11.5–15.5)
WBC: 7.3 10*3/uL (ref 4.0–10.5)

## 2017-01-13 LAB — BASIC METABOLIC PANEL
Anion gap: 4 — ABNORMAL LOW (ref 5–15)
BUN: 8 mg/dL (ref 6–20)
CHLORIDE: 109 mmol/L (ref 101–111)
CO2: 24 mmol/L (ref 22–32)
Calcium: 8.1 mg/dL — ABNORMAL LOW (ref 8.9–10.3)
Creatinine, Ser: 1.26 mg/dL — ABNORMAL HIGH (ref 0.61–1.24)
GFR, EST NON AFRICAN AMERICAN: 56 mL/min — AB (ref >60)
Glucose, Bld: 123 mg/dL — ABNORMAL HIGH (ref 65–99)
POTASSIUM: 3.9 mmol/L (ref 3.5–5.1)
SODIUM: 137 mmol/L (ref 135–145)

## 2017-01-13 MED ORDER — KCL IN DEXTROSE-NACL 20-5-0.45 MEQ/L-%-% IV SOLN
INTRAVENOUS | Status: DC
  Administered 2017-01-13: 02:00:00 via INTRAVENOUS
  Filled 2017-01-13: qty 1000

## 2017-01-13 MED ORDER — LEVOTHYROXINE SODIUM 150 MCG PO TABS: 150.0000 ug | ORAL_TABLET | Freq: Every day | ORAL | 1 refills | Status: DC

## 2017-01-13 NOTE — Discharge Summary (Signed)
Physician Discharge Summary  Eric Suarez [id number] DOB: 1946/05/21 DOA: 01/10/2017  PCP: Dwan Bolt, MD  Admit date: 01/10/2017 Discharge date: 01/13/2017  Admitted From: Home Disposition: Home  Recommendations for Outpatient Follow-up:  1. Follow up with PCP in 1-2 weeks, Follow-up with Dr. Rosendo Gros in 1 week. 2. Please obtain BMP/CBC in one week  Home Health: NA Equipment/Devices:NA  Discharge Condition: Stable CODE STATUS: Full Code Diet recommendation: DIET SOFT Room service appropriate? Yes; Fluid consistency: Thin Diet - low sodium heart healthy  Brief/Interim Summary: Eric Suarez a 71 y.o.malethe past medical history significant for anxiety, cancer, coronary artery disease, reflux, hypothyroidism, hypertension who presented to the emergency room with abdominal pain. Of note patient had ventral hernia repair in February. Patient states about 2 weeks after the surgery he began to have abdominal discomfort. He describes the discomfort as a strong ache. Began acutely worse yesterday. Patient has some nausea but no vomiting. At its worst the pain was rated as greater than 10 out of 10. Patient denies any abdominal trauma. Patient's last bowel movement was the day before admission with the addition of enema. Patient has not passed any flatus in the last 12 hours. Patient has no appetite.  Discharge Diagnoses:  Principal Problem:   SBO (small bowel obstruction) Active Problems:   CKD (chronic kidney disease) stage 3, GFR 30-59 ml/min   Hypothyroidism   HTN (hypertension)   Post-operative complication   Small bowel obstruction This is secondary to herniation of the small bowel into the retrorectus space, this is released by surgery. Had laparoscopic surgery on 3/2 done by Dr. Rosendo Gros He did well post surgery, was an NG tube, clamped, clears, diet advance to soft earlier today. Denies any nausea or vomiting, patient discharged home to follow-up with Dr.  Rosendo Gros in 1 week.  CKD stage 2-3, status post left sided nephrectomy Creatinine at baseline, currently at 1.27. Check BMP in a.m.  Hypothyroidism TSH is 9.868, he is on 125 g of Synthroid, Synthroid increased to 150 g.  Hypertension When necessary hydralazine 10 mg IV as needed for severe blood pressure  GERD PPI bid   Discharge Instructions  Discharge Instructions    Diet - low sodium heart healthy    Complete by:  As directed    Increase activity slowly    Complete by:  As directed      Allergies as of 01/13/2017      Reactions   Tamsulosin Nausea And Vomiting, Other (See Comments)   Uncomfortable in pelvic region   Sulfa Antibiotics Rash      Medication List    TAKE these medications   alfuzosin 10 MG 24 hr tablet Commonly known as:  UROXATRAL Take 5 mg by mouth daily.   atorvastatin 20 MG tablet Commonly known as:  LIPITOR Take 20 mg by mouth at bedtime.   CoQ10 100 MG Caps Take 1 capsule by mouth daily.   Fish Oil 1200 MG Caps Take 2 capsules by mouth 2 (two) times daily.   lansoprazole 15 MG capsule Commonly known as:  PREVACID Take 15 mg by mouth at bedtime.   levothyroxine 150 MCG tablet Commonly known as:  SYNTHROID Take 1 tablet (150 mcg total) by mouth daily before breakfast. What changed:  medication strength  how much to take  when to take this   OVER THE COUNTER MEDICATION Take 1 tablet by mouth daily. Cognium - Brain Vitamin   PARoxetine 25 MG 24 hr tablet Commonly known as:  PAXIL-CR  Take 25 mg by mouth daily.   phenol 1.4 % Liqd Commonly known as:  CHLORASEPTIC Use as directed 1 spray in the mouth or throat as needed for throat irritation / pain.   potassium chloride SA 20 MEQ tablet Commonly known as:  K-DUR,KLOR-CON Take 20 mEq by mouth daily.   PRESERVISION AREDS PO Take 1 capsule by mouth daily.      Follow-up Information    Reyes Ivan, MD Follow up in 1 week(s).   Specialty:  General  Surgery Contact information: [address] [place]  [address] [phone]          Allergies  Allergen Reactions  . Tamsulosin Nausea And Vomiting and Other (See Comments)    Uncomfortable in pelvic region  . Sulfa Antibiotics Rash    Consultations:  Treatment Team:   Ralene Ok, MD  Procedures (Echo, Carotid, EGD, Colonoscopy, ERCP)   Radiological studies: Ct Abdomen Pelvis Wo Contrast  Result Date: 12/25/2016 CLINICAL DATA:  Generalized body aches.  Abdominal pain. EXAM: CT ABDOMEN AND PELVIS WITHOUT CONTRAST TECHNIQUE: Multidetector CT imaging of the abdomen and pelvis was performed following the standard protocol without IV contrast. COMPARISON:  11/29/2016 FINDINGS: Lower chest: Dependent opacities in the lung bases, likely atelectasis. Heart is normal size. Coronary artery calcifications in the left main, left anterior and left circumflex coronary arteries. Trace bilateral effusions. Hepatobiliary: No focal hepatic abnormality. Gallbladder unremarkable. Pancreas: No focal abnormality or ductal dilatation. Spleen: Mild splenomegaly with a craniocaudal length of the spleen measuring 14.4 cm. Findings are stable. Adrenals/Urinary Tract: Left kidney is absent. Multiple calcifications noted along the posterior bladder wall. The bladder wall appears thickened. Foley catheter is in place within the bladder which is decompressed. Punctate calcification near the right UVJ could be knee right ureteral stone. No hydronephrosis on the right. No renal stones. Adrenal glands are unremarkable. Stomach/Bowel: Scattered left colonic diverticulosis. No active diverticulitis. No evidence of bowel obstruction. Vascular/Lymphatic: Aortic and iliac calcifications. No aneurysm or adenopathy. Reproductive: Prostate enlargement and calcifications. Other: Trace free fluid in the pelvis. Fluid is noted in the midline of the anterior abdominal wall, in the area of prior hernia, likely  postoperative fluid collection. This measures 3.8 x 3.6 cm. Stranding noted within the subcutaneous soft tissues of the anterior abdominal wall, likely postoperative. A few locules of gas in the subcutaneous soft tissues, likely postoperative. Musculoskeletal: No acute bony abnormality. IMPRESSION: Postoperative changes from ventral hernia repair. Fluid collection at the site of prior ventral hernia measures 3.8 x 3.6 cm. This could represent postoperative seroma, hematoma or abscess. Trace bilateral pleural effusions. Bibasilar dependent opacities, likely atelectasis. Stable splenomegaly. Prostate enlargement with calcifications along the posterior bladder wall, most of which are likely in the collapsed lumen although at least 1 right posterior calcification on image 85 could be within the wall of the bladder. Also, an adjacent calcification is near the right ureterovesical junction. No hydronephrosis. Scattered colonic diverticulosis.  No active diverticulitis. Electronically Signed   By: Rolm Baptise M.D.   On: 12/25/2016 14:31   Dg Chest 2 View  Result Date: 12/25/2016 CLINICAL DATA:  Short of breath.  Fall today. EXAM: CHEST  2 VIEW COMPARISON:  10/24/2016 FINDINGS: Heart size upper normal. Mild vascular congestion with interstitial edema. Small pleural effusions bilaterally. Mild bibasilar atelectasis with hypoventilation. No acute skeletal abnormality. IMPRESSION: Mild heart failure with interstitial edema and small pleural effusions. Mild bibasilar atelectasis. Electronically Signed   By: Franchot Gallo M.D.  On: 12/25/2016 14:45   Ct Abdomen Pelvis W Contrast  Result Date: 01/10/2017 CLINICAL DATA:  Abdominal pain. Recent ventral hernia repair and left nephrectomy for renal cell carcinoma EXAM: CT ABDOMEN AND PELVIS WITH CONTRAST TECHNIQUE: Multidetector CT imaging of the abdomen and pelvis was performed using the standard protocol following bolus administration of intravenous contrast. CONTRAST:   100 mL ISOVUE-300 IOPAMIDOL (ISOVUE-300) INJECTION 61% COMPARISON:  December 25, 2016 FINDINGS: Lower chest: There is patchy bibasilar atelectatic change. There are foci of coronary artery calcification. There is a hiatal hernia with contrast refluxing from the stomach into the hiatal hernia. Hepatobiliary: No focal liver lesions are appreciable. The gallbladder is slightly distended with thickened wall. There is no appreciable pericholecystic fluid. No biliary duct dilatation. Pancreas: No pancreatic mass or inflammatory focus. Spleen: Spleen is enlarged, measuring 14.3 x 15.7 x 8.1 cm with a measured splenic volume of 909 cubic cm. No focal splenic lesions or perisplenic fluid evident. Adrenals/Urinary Tract: Adrenals appear normal bilaterally. Left kidney is absent. There is no mass in the left renal fossa region. There are small cysts in the right kidney, largest measuring just over 1 cm. No right renal hydronephrosis. No right renal calculus or right ureteral calculus. Urinary bladder is midline with wall thickness within normal limits. Stomach/Bowel: There are multiple loops of dilated small bowel. Stomach is also dilated. There is a transition zone in the midline portion of the mid abdomen at the level of proximal to mid ileum consistent with bowel obstruction. On coronal view, there is the suggestion of a twisting type pattern in this area suggesting internal hernia as the cause of this obstruction. There is no free air or portal venous air. There are multiple sigmoid diverticula without diverticulitis. Vascular/Lymphatic: There is atherosclerotic calcification in the aorta and common iliac arteries. No abdominal aortic aneurysm. Major mesenteric vessels appear patent. No adenopathy is appreciable in the abdomen or pelvis. Reproductive: Prostate is enlarged and impresses on the inferior urinary bladder. There are multiple prostatic calculi. Seminal vesicles also appear mildly prominent. Other: There is  moderate ascites throughout the abdomen. Appendix absent. No abscess evident. There is fluid and thickening in the periumbilical region in the area of recent hernia repair. The fluid collection in this area is less well-defined than on study from 3 weeks prior. There may be liquefying hematoma in this area. There is no air in this area to suggest frank abscess, although infected fluid cannot be excluded by CT. Musculoskeletal: There is lumbar levoscoliosis. There is degenerative change in the lumbar spine. There are no blastic or lytic bone lesions. No intramuscular lesions are evident. IMPRESSION: There is small bowel obstruction with the transition zone in the mid abdomen involving the proximal to mid ileum. On the coronal images, there is a suggestion of a twisting pattern in this area suggesting that there may be internal hernia in the mid abdomen causing this small bowel obstruction. Moderate ascites. Status post left nephrectomy without complicating feature in the left renal fossa region. Postoperative change at the site of a recent ventral hernia. There is moderate fluid and soft tissue thickening in this area. There is a less well-defined fluid collection in this area compared to 3 weeks prior. Suspect resolving liquefying hematoma. Underlying infected fluid in this area cannot be excluded, although there is no air in this area to suggest frank abscess. Gallbladder wall appears thickened. Ascites can cause this pattern. Early cholecystitis could also present in this manner. Close clinical surveillance in this regard advised.  Splenomegaly. Focal hiatal hernia with spontaneous gastroesophageal reflux. Aortoiliac atherosclerosis.  Foci of coronary artery calcification. Enlarged prostate containing multiple prostatic calculi. Multiple sigmoid diverticulum without diverticulitis. Electronically Signed   By: Lowella Grip III M.D.   On: 01/10/2017 07:47   Dg Knee Complete 4 Views Left  Result Date:  12/25/2016 CLINICAL DATA:  Bilateral knee pain.  Fell. EXAM: LEFT KNEE - COMPLETE 4+ VIEW; RIGHT KNEE - COMPLETE 4+ VIEW COMPARISON:  None. FINDINGS: The patellofemoral joint spaces are narrowed bilaterally. The medial and lateral compartments are fairly well maintained. There is also mild scalloping of the distal femur on lateral films. These findings can be seen with CPPD arthropathy. No obvious chondrocalcinosis. No acute fracture or osteochondral lesion. IMPRESSION: 1. No acute fracture. 2. Patellofemoral joint degenerative changes, small joint effusions and scalloping of the distal femurs most typically seen with CPPD arthropathy. No definite chondrocalcinosis. Electronically Signed   By: Marijo Sanes M.D.   On: 12/25/2016 14:59   Dg Knee Complete 4 Views Right  Result Date: 12/25/2016 CLINICAL DATA:  Bilateral knee pain.  Fell. EXAM: LEFT KNEE - COMPLETE 4+ VIEW; RIGHT KNEE - COMPLETE 4+ VIEW COMPARISON:  None. FINDINGS: The patellofemoral joint spaces are narrowed bilaterally. The medial and lateral compartments are fairly well maintained. There is also mild scalloping of the distal femur on lateral films. These findings can be seen with CPPD arthropathy. No obvious chondrocalcinosis. No acute fracture or osteochondral lesion. IMPRESSION: 1. No acute fracture. 2. Patellofemoral joint degenerative changes, small joint effusions and scalloping of the distal femurs most typically seen with CPPD arthropathy. No definite chondrocalcinosis. Electronically Signed   By: Marijo Sanes M.D.   On: 12/25/2016 14:59   Dg Abd Acute W/chest  Result Date: 01/10/2017 CLINICAL DATA:  Abdominal pain and constipation. EXAM: DG ABDOMEN ACUTE W/ 1V CHEST COMPARISON:  CT 12/25/2016 FINDINGS: Streaky left basilar atelectasis. Normal heart size with aortic tortuosity. Small hiatal hernia. No free intra-abdominal air. No significant increased stool burden to suggest constipation. Dilated air-filled small bowel in the left  abdomen with air-fluid level. Mild gaseous distention of hepatic flexure. No evidence of radiopaque calculi. Pelvic phleboliths are noted. No acute osseous abnormalities are seen. IMPRESSION: 1. No increased stool burden to suggest constipation. 2. A few dilated small bowel loops in the left mid abdomen with air-fluid levels, can be seen with ileus or early obstruction. 3. Streaky left basilar atelectasis. Electronically Signed   By: Jeb Levering M.D.   On: 01/10/2017 02:14     Subjective:  Discharge Exam: Vitals:   01/12/17 0535 01/12/17 1514 01/12/17 2100 01/13/17 0553  BP: 107/63 122/65 126/63 113/66  Pulse: 87 84 69 82  Resp: 17 18 18 17   Temp: 98.9 F (37.2 C) 98.4 F (36.9 C) 98.6 F (37 C) 98.8 F (37.1 C)  TempSrc: Oral Oral Oral Oral  SpO2: 100% 99% 100% 97%  Weight:      Height:       General: Pt is alert, awake, not in acute distress Cardiovascular: RRR, S1/S2 +, no rubs, no gallops Respiratory: CTA bilaterally, no wheezing, no rhonchi Abdominal: Soft, NT, ND, bowel sounds + Extremities: no edema, no cyanosis   The results of significant diagnostics from this hospitalization (including imaging, microbiology, ancillary and laboratory) are listed below for reference.    Microbiology: No results found for this or any previous visit (from the past 240 hour(s)).   Labs: BNP (last 3 results) No results for input(s): BNP in the last  8760 hours. Basic Metabolic Panel:  Recent Labs Lab 01/10/17 0119 01/10/17 1116 01/11/17 0459 01/12/17 0357 01/13/17 0317  NA 136  --  136 139 137  K 3.9  --  4.6 4.0 3.9  CL 104  --  106 111 109  CO2 22  --  23 24 24   GLUCOSE 140*  --  158* 125* 123*  BUN 15  --  11 9 8   CREATININE 1.44*  --  1.27* 1.31* 1.26*  CALCIUM 8.5*  --  8.2* 8.0* 8.1*  MG  --  2.0  --   --   --   PHOS  --  4.0  --   --   --    Liver Function Tests:  Recent Labs Lab 01/10/17 0119  AST 20  ALT 30  ALKPHOS 85  BILITOT 1.1  PROT 6.2*   ALBUMIN 3.1*    Recent Labs Lab 01/10/17 0119  LIPASE 67*   No results for input(s): AMMONIA in the last 168 hours. CBC:  Recent Labs Lab 01/10/17 0119 01/11/17 0459 01/13/17 0317  WBC 12.3* 8.9 7.3  HGB 12.7* 11.7* 11.9*  HCT 38.0* 36.1* 36.5*  MCV 89.4 90.9 91.0  PLT 284 271 222   Cardiac Enzymes: No results for input(s): CKTOTAL, CKMB, CKMBINDEX, TROPONINI in the last 168 hours. BNP: Invalid input(s): POCBNP CBG: No results for input(s): GLUCAP in the last 168 hours. D-Dimer No results for input(s): DDIMER in the last 72 hours. Hgb A1c No results for input(s): HGBA1C in the last 72 hours. Lipid Profile No results for input(s): CHOL, HDL, LDLCALC, TRIG, CHOLHDL, LDLDIRECT in the last 72 hours. Thyroid function studies  Recent Labs  01/11/17 1303  TSH 9.868*   Anemia work up No results for input(s): VITAMINB12, FOLATE, FERRITIN, TIBC, IRON, RETICCTPCT in the last 72 hours. Urinalysis    Component Value Date/Time   COLORURINE YELLOW 01/10/2017 0113   APPEARANCEUR HAZY (A) 01/10/2017 0113   LABSPEC 1.015 01/10/2017 0113   PHURINE 8.0 01/10/2017 0113   GLUCOSEU NEGATIVE 01/10/2017 0113   HGBUR NEGATIVE 01/10/2017 0113   BILIRUBINUR NEGATIVE 01/10/2017 0113   KETONESUR 15 (A) 01/10/2017 0113   PROTEINUR NEGATIVE 01/10/2017 0113   NITRITE NEGATIVE 01/10/2017 0113   LEUKOCYTESUR SMALL (A) 01/10/2017 0113   Sepsis Labs Invalid input(s): PROCALCITONIN,  WBC,  LACTICIDVEN Microbiology No results found for this or any previous visit (from the past 240 hour(s)).   Time coordinating discharge: Over 30 minutes  SIGNED:   Birdie Hopes, MD  [facility] 01/13/2017, 11:11 AM Pager   If 7PM-7AM, please contact night-coverage [email or web address] Password [email or web address]

## 2017-01-13 NOTE — Progress Notes (Signed)
Eric Suarez to be D/C'd  per MD order. Discussed with the patient and all questions fully answered.  VSS, Skin clean, dry and intact without evidence of skin break down, no evidence of skin tears noted.  IV catheter discontinued intact. Site without signs and symptoms of complications. Dressing and pressure applied.  An After Visit Summary was printed and given to the patient. Patient received prescription.  D/c education completed with patient/family including follow up instructions, medication list, d/c activities limitations if indicated, with other d/c instructions as indicated by MD - patient able to verbalize understanding, all questions fully answered.   Patient instructed to return to ED, call 911, or call MD for any changes in condition.   Patient to be escorted via [place], and D/C home via private auto.

## 2017-01-13 NOTE — Progress Notes (Signed)
3 Days Post-Op  Subjective: Pt doing well.  Having BMs Mobilizing No n/v with NGT clamped.  Objective: Vital signs in last 24 hours: Temp:  [98.4 F (36.9 C)-98.8 F (37.1 C)] 98.8 F (37.1 C) (03/05 0553) Pulse Rate:  [69-84] 82 (03/05 0553) Resp:  [17-18] 17 (03/05 0553) BP: (113-126)/(63-66) 113/66 (03/05 0553) SpO2:  [97 %-100 %] 97 % (03/05 0553) Last BM Date: 01/12/17  Intake/Output from previous day: 03/04 0701 - 03/05 0700 In: 3390 [P.O.:1340; I.V.:300] Out: 900 [Urine:800; Drains:100] Intake/Output this shift: No intake/output data recorded.  General appearance: alert and cooperative GI: soft, non-tender; bowel sounds normal; no masses,  no organomegaly  Lab Results:   Recent Labs  01/11/17 0459 01/13/17 0317  WBC 8.9 7.3  HGB 11.7* 11.9*  HCT 36.1* 36.5*  PLT 271 222   BMET  Recent Labs  01/12/17 0357 01/13/17 0317  NA 139 137  K 4.0 3.9  CL 111 109  CO2 24 24  GLUCOSE 125* 123*  BUN 9 8  CREATININE 1.31* 1.26*  CALCIUM 8.0* 8.1*   PT/INR  Recent Labs  01/10/17 1116  LABPROT 15.9*  INR 1.27   ABG No results for input(s): PHART, HCO3 in the last 72 hours.  Invalid input(s): PCO2, PO2  Studies/Results: No results found.  Anti-infectives: Anti-infectives    Start     Dose/Rate Route Frequency Ordered Stop   01/10/17 1415  cefoTEtan (CEFOTAN) 2 g in dextrose 5 % 50 mL IVPB     2 g 100 mL/hr over 30 Minutes Intravenous To Surgery 01/10/17 1409 01/10/17 1615   01/10/17 1352  cefoTEtan in Dextrose 5% (CEFOTAN) 2-2.08 GM-% IVPB    Comments:  Merryl Hacker   : cabinet override      01/10/17 1352 01/11/17 0159   01/10/17 1015  cefoTEtan (CEFOTAN) 2 g in dextrose 5 % 50 mL IVPB  Status:  Discontinued     2 g 100 mL/hr over 30 Minutes Intravenous On call to O.R. 01/10/17 1002 01/10/17 1411      Assessment/Plan: s/p Procedure(s): LAPAROSCOPY DIAGNOSTIC , POSSIBLE LAPAROTOMY (N/A) INSERTION OF MESH (N/A) DC NGt Start soft  diet If tol lunch OK for DC home this PM. Will set up f/u with me in 1 week  LOS: 2 days    Rosario Jacks., Anne Hahn 01/13/2017

## 2017-02-11 DIAGNOSIS — I10 Essential (primary) hypertension: Secondary | ICD-10-CM | POA: Diagnosis not present

## 2017-02-11 DIAGNOSIS — E789 Disorder of lipoprotein metabolism, unspecified: Secondary | ICD-10-CM | POA: Diagnosis not present

## 2017-02-18 DIAGNOSIS — C649 Malignant neoplasm of unspecified kidney, except renal pelvis: Secondary | ICD-10-CM | POA: Diagnosis not present

## 2017-02-18 DIAGNOSIS — R109 Unspecified abdominal pain: Secondary | ICD-10-CM | POA: Diagnosis not present

## 2017-02-18 DIAGNOSIS — E039 Hypothyroidism, unspecified: Secondary | ICD-10-CM | POA: Diagnosis not present

## 2017-02-18 DIAGNOSIS — Z09 Encounter for follow-up examination after completed treatment for conditions other than malignant neoplasm: Secondary | ICD-10-CM | POA: Diagnosis not present

## 2017-02-24 DIAGNOSIS — L739 Follicular disorder, unspecified: Secondary | ICD-10-CM | POA: Diagnosis not present

## 2017-03-25 DIAGNOSIS — L0390 Cellulitis, unspecified: Secondary | ICD-10-CM | POA: Diagnosis not present

## 2017-03-25 DIAGNOSIS — E039 Hypothyroidism, unspecified: Secondary | ICD-10-CM | POA: Diagnosis not present

## 2017-03-25 DIAGNOSIS — M549 Dorsalgia, unspecified: Secondary | ICD-10-CM | POA: Diagnosis not present

## 2017-03-25 DIAGNOSIS — I10 Essential (primary) hypertension: Secondary | ICD-10-CM | POA: Diagnosis not present

## 2017-03-26 DIAGNOSIS — L0390 Cellulitis, unspecified: Secondary | ICD-10-CM | POA: Diagnosis not present

## 2017-03-31 DIAGNOSIS — N400 Enlarged prostate without lower urinary tract symptoms: Secondary | ICD-10-CM | POA: Diagnosis not present

## 2017-03-31 DIAGNOSIS — C642 Malignant neoplasm of left kidney, except renal pelvis: Secondary | ICD-10-CM | POA: Diagnosis not present

## 2017-04-01 DIAGNOSIS — M549 Dorsalgia, unspecified: Secondary | ICD-10-CM | POA: Diagnosis not present

## 2017-04-01 DIAGNOSIS — L0390 Cellulitis, unspecified: Secondary | ICD-10-CM | POA: Diagnosis not present

## 2017-04-11 DIAGNOSIS — S99922A Unspecified injury of left foot, initial encounter: Secondary | ICD-10-CM | POA: Diagnosis not present

## 2017-04-12 NOTE — Addendum Note (Signed)
Addendum  created 04/12/17 0934 by Duane Boston, MD   Sign clinical note

## 2017-04-25 DIAGNOSIS — R161 Splenomegaly, not elsewhere classified: Secondary | ICD-10-CM | POA: Diagnosis not present

## 2017-04-25 DIAGNOSIS — J984 Other disorders of lung: Secondary | ICD-10-CM | POA: Diagnosis not present

## 2017-04-25 DIAGNOSIS — C642 Malignant neoplasm of left kidney, except renal pelvis: Secondary | ICD-10-CM | POA: Diagnosis not present

## 2017-05-02 DIAGNOSIS — Z85528 Personal history of other malignant neoplasm of kidney: Secondary | ICD-10-CM | POA: Diagnosis not present

## 2017-05-06 DIAGNOSIS — Z6825 Body mass index (BMI) 25.0-25.9, adult: Secondary | ICD-10-CM | POA: Diagnosis not present

## 2017-05-06 DIAGNOSIS — M5416 Radiculopathy, lumbar region: Secondary | ICD-10-CM | POA: Diagnosis not present

## 2017-05-06 DIAGNOSIS — I10 Essential (primary) hypertension: Secondary | ICD-10-CM | POA: Diagnosis not present

## 2017-05-12 DIAGNOSIS — T814XXA Infection following a procedure, initial encounter: Secondary | ICD-10-CM | POA: Diagnosis not present

## 2017-05-13 ENCOUNTER — Other Ambulatory Visit (HOSPITAL_COMMUNITY): Payer: Self-pay | Admitting: General Surgery

## 2017-05-13 DIAGNOSIS — S301XXS Contusion of abdominal wall, sequela: Secondary | ICD-10-CM

## 2017-05-20 ENCOUNTER — Other Ambulatory Visit: Payer: Self-pay | Admitting: Radiology

## 2017-05-20 ENCOUNTER — Other Ambulatory Visit: Payer: Self-pay | Admitting: Physician Assistant

## 2017-05-21 ENCOUNTER — Ambulatory Visit (HOSPITAL_COMMUNITY)
Admission: RE | Admit: 2017-05-21 | Discharge: 2017-05-21 | Disposition: A | Discharge status: Home / Self Care | Payer: PPO | Source: Ambulatory Visit | Attending: General Surgery | Admitting: General Surgery

## 2017-05-21 ENCOUNTER — Encounter (HOSPITAL_COMMUNITY): Payer: Self-pay

## 2017-05-21 DIAGNOSIS — Z882 Allergy status to sulfonamides status: Secondary | ICD-10-CM | POA: Diagnosis not present

## 2017-05-21 DIAGNOSIS — I2510 Atherosclerotic heart disease of native coronary artery without angina pectoris: Secondary | ICD-10-CM | POA: Diagnosis not present

## 2017-05-21 DIAGNOSIS — H3530 Unspecified macular degeneration: Secondary | ICD-10-CM | POA: Diagnosis not present

## 2017-05-21 DIAGNOSIS — T792XXS Traumatic secondary and recurrent hemorrhage and seroma, sequela: Secondary | ICD-10-CM | POA: Diagnosis not present

## 2017-05-21 DIAGNOSIS — K219 Gastro-esophageal reflux disease without esophagitis: Secondary | ICD-10-CM | POA: Diagnosis not present

## 2017-05-21 DIAGNOSIS — K432 Incisional hernia without obstruction or gangrene: Secondary | ICD-10-CM | POA: Diagnosis not present

## 2017-05-21 DIAGNOSIS — Z905 Acquired absence of kidney: Secondary | ICD-10-CM | POA: Insufficient documentation

## 2017-05-21 DIAGNOSIS — F419 Anxiety disorder, unspecified: Secondary | ICD-10-CM | POA: Insufficient documentation

## 2017-05-21 DIAGNOSIS — Y839 Surgical procedure, unspecified as the cause of abnormal reaction of the patient, or of later complication, without mention of misadventure at the time of the procedure: Secondary | ICD-10-CM | POA: Insufficient documentation

## 2017-05-21 DIAGNOSIS — Z85528 Personal history of other malignant neoplasm of kidney: Secondary | ICD-10-CM | POA: Insufficient documentation

## 2017-05-21 DIAGNOSIS — Z79899 Other long term (current) drug therapy: Secondary | ICD-10-CM | POA: Insufficient documentation

## 2017-05-21 DIAGNOSIS — I10 Essential (primary) hypertension: Secondary | ICD-10-CM | POA: Insufficient documentation

## 2017-05-21 DIAGNOSIS — E039 Hypothyroidism, unspecified: Secondary | ICD-10-CM | POA: Diagnosis not present

## 2017-05-21 DIAGNOSIS — T814XXA Infection following a procedure, initial encounter: Secondary | ICD-10-CM | POA: Diagnosis not present

## 2017-05-21 DIAGNOSIS — S301XXS Contusion of abdominal wall, sequela: Secondary | ICD-10-CM

## 2017-05-21 LAB — CBC
HEMATOCRIT: 40.7 % (ref 39.0–52.0)
Hemoglobin: 14.1 g/dL (ref 13.0–17.0)
MCH: 30 pg (ref 26.0–34.0)
MCHC: 34.6 g/dL (ref 30.0–36.0)
MCV: 86.6 fL (ref 78.0–100.0)
PLATELETS: 149 10*3/uL — AB (ref 150–400)
RBC: 4.7 MIL/uL (ref 4.22–5.81)
RDW: 14.5 % (ref 11.5–15.5)
WBC: 6.9 10*3/uL (ref 4.0–10.5)

## 2017-05-21 LAB — PROTIME-INR
INR: 1.05
PROTHROMBIN TIME: 13.7 s (ref 11.4–15.2)

## 2017-05-21 LAB — APTT: APTT: 35 s (ref 24–36)

## 2017-05-21 MED ORDER — MIDAZOLAM HCL 2 MG/2ML IJ SOLN
INTRAMUSCULAR | Status: AC | PRN
  Administered 2017-05-21 (×3): 0.5 mg via INTRAVENOUS
  Administered 2017-05-21: 2 mg via INTRAVENOUS
  Administered 2017-05-21: 1 mg via INTRAVENOUS

## 2017-05-21 MED ORDER — SODIUM CHLORIDE 0.9 % IV SOLN
INTRAVENOUS | Status: DC
  Administered 2017-05-21: 11:00:00 via INTRAVENOUS

## 2017-05-21 MED ORDER — MIDAZOLAM HCL 2 MG/2ML IJ SOLN
INTRAMUSCULAR | Status: AC
  Filled 2017-05-21: qty 4

## 2017-05-21 MED ORDER — FENTANYL CITRATE (PF) 100 MCG/2ML IJ SOLN
INTRAMUSCULAR | Status: AC | PRN
  Administered 2017-05-21: 25 ug via INTRAVENOUS
  Administered 2017-05-21: 50 ug via INTRAVENOUS
  Administered 2017-05-21: 25 ug via INTRAVENOUS

## 2017-05-21 MED ORDER — FENTANYL CITRATE (PF) 100 MCG/2ML IJ SOLN
INTRAMUSCULAR | Status: AC
  Filled 2017-05-21: qty 2

## 2017-05-21 NOTE — Sedation Documentation (Signed)
Patient is resting comfortably. 

## 2017-05-21 NOTE — Consult Note (Signed)
Chief Complaint: Patient was seen in consultation today for ultrasound-guided aspiration of abdominal wall seroma  Referring Physician(s): Ramirez,Armando  Supervising Physician: Aletta Edouard  Patient Status: Eric Suarez  History of Present Illness: Eric Suarez is a 71 y.o. male with history of left nephrectomy in 2017 for renal cell carcinoma as well as laparoscopic 3 peritoneal ventral incisional hernia repair with mesh on 12/23/16. He subsequently underwent laparoscopy with Vicryl mesh placement for SBO/ herniated bowel within the retrorectus space on 01/10/17. Request now received for ultrasound-guided aspiration of abdominal wall seroma postop and patient presents today for procedure.  Past Medical History:  Diagnosis Date  . Anxiety   . Arthritis   . Cancer [facility]) [id number]   thryoid, left kidney  . Cataracts, bilateral   . Complication of anesthesia    unable to void after surgery  . Coronary artery disease    pt denies- had stress>10 yrs ago due to"get winded walking uphill'- neg  . GERD (gastroesophageal reflux disease)   . History of hiatal hernia   . History of kidney surgery    left kidney removed due to cancer 2017  . Hypertension   . Hypothyroidism   . Left renal mass   . Macular degeneration of both eyes    dry  . PONV (postoperative nausea and vomiting)     Past Surgical History:  Procedure Laterality Date  . APPENDECTOMY    . CYSTOSCOPY WITH RETROGRADE PYELOGRAM, URETEROSCOPY AND STENT PLACEMENT Left 04/01/2016   Procedure: CYSTOSCOPY WITH RETROGRADE PYELOGRAM, left ureter;  Surgeon: Raynelle Bring, MD;  Location: [facility];  Service: Urology;  Laterality: Left;  . HEMORRHOID SURGERY    . HERNIA REPAIR     inguinal-3 on left , 1 on right  . INCISIONAL HERNIA REPAIR N/A 12/23/2016   Procedure: LAPAROSCOPIC VERSUS OPEN LYSIS OF ADHESIONS AND INCISIONAL HERNIA REPAIR WITH MESH;  Surgeon: Ralene Ok, MD;  Location: [place];  Service: General;   Laterality: N/A;  . INSERTION OF MESH N/A 12/23/2016   Procedure: INSERTION OF MESH;  Surgeon: Ralene Ok, MD;  Location: [place];  Service: General;  Laterality: N/A;  . INSERTION OF MESH N/A 01/10/2017   Procedure: INSERTION OF MESH;  Surgeon: Ralene Ok, MD;  Location: [place];  Service: General;  Laterality: N/A;  . LAPAROSCOPIC NEPHRECTOMY Left 04/18/2016   Procedure: LAPAROSCOPIC RADICAL  LEFT NEPHRECTOMY;  Surgeon: Raynelle Bring, MD;  Location: [facility];  Service: Urology;  Laterality: Left;  . LAPAROSCOPY N/A 01/10/2017   Procedure: LAPAROSCOPY DIAGNOSTIC , POSSIBLE LAPAROTOMY;  Surgeon: Ralene Ok, MD;  Location: [place];  Service: General;  Laterality: N/A;  . NASAL SINUS SURGERY     left side  . THYROIDECTOMY, PARTIAL     left small amount on left side-for cancer  . TONSILLECTOMY      Allergies: Tamsulosin and Sulfa antibiotics  Medications: Prior to Admission medications   Medication Sig Start Date End Date Taking? Authorizing Provider  alfuzosin (UROXATRAL) 10 MG 24 hr tablet Take 5 mg by mouth daily.  12/30/16  Yes [provider]  atorvastatin (LIPITOR) 20 MG tablet Take 20 mg by mouth at bedtime.   Yes [provider]  Coenzyme Q10 (COQ10) 100 MG CAPS Take 1 capsule by mouth daily.   Yes [provider]  lansoprazole (PREVACID) 15 MG capsule Take 15 mg by mouth at bedtime.   Yes [provider]  levothyroxine (SYNTHROID) 150 MCG tablet Take 1 tablet (150 mcg total)  by mouth daily before breakfast. 01/13/17  Yes Verlee Monte, MD  Omega-3 Fatty Acids (FISH OIL) 1200 MG CAPS Take 2 capsules by mouth 2 (two) times daily.   Yes [provider]  OVER THE COUNTER MEDICATION Take 1 tablet by mouth daily. Cognium - Brain Vitamin   Yes [provider]  PARoxetine (PAXIL-CR) 25 MG 24 hr tablet Take 25 mg by mouth daily. 02/21/16  Yes [provider]  phenol (CHLORASEPTIC) 1.4 % LIQD Use as directed 1 spray in the mouth or  throat as needed for throat irritation / pain.   Yes [provider]  potassium chloride SA (K-DUR,KLOR-CON) 20 MEQ tablet Take 20 mEq by mouth daily.   Yes [provider]  Multiple Vitamins-Minerals (PRESERVISION AREDS PO) Take 1 capsule by mouth daily.    [provider]     History reviewed. No pertinent family history.  Social History   Social History  . Marital status: Married    Spouse name: N/A  . Number of children: N/A  . Years of education: N/A   Social History Main Topics  . Smoking status: Never Smoker  . Smokeless tobacco: Never Used  . Alcohol use No  . Drug use: No  . Sexual activity: Not Asked   Other Topics Concern  . None   Social History Narrative  . None      Review of Systems currently denies fever, headache, chest pain, dyspnea, cough, back pain, nausea, vomiting or abnormal bleeding. He does have some intermittent mild periumbilical abdominal discomfort.  Vital Signs: BP 110/76 (BP Location: Left Arm)   Pulse 64   Temp 98.1 F (36.7 C) (Oral)   Resp 16   SpO2 100%   Physical Exam awake, alert. Chest clear to auscultation bilaterally. Heart with regular rate and rhythm. Abdomen soft, positive bowel sounds, soft tissue/SQ protrusion with Valsalva maneuver noted above the umbilicus, mild associated erythema at periumbilical region, site mildly tender to palpation; no lower extremity edema  Mallampati Score:     Imaging: No results found.  Labs:  CBC:  Recent Labs  12/27/16 0411 01/10/17 0119 01/11/17 0459 01/13/17 0317  WBC 9.1 12.3* 8.9 7.3  HGB 11.0* 12.7* 11.7* 11.9*  HCT 32.3* 38.0* 36.1* 36.5*  PLT 109* 284 271 222    COAGS:  Recent Labs  01/10/17 1116  INR 1.27  APTT 36    BMP:  Recent Labs  01/10/17 0119 01/11/17 0459 01/12/17 0357 01/13/17 0317  NA 136 136 139 137  K 3.9 4.6 4.0 3.9  CL 104 106 111 109  CO2 22 23 24 24   GLUCOSE 140* 158* 125* 123*  BUN 15 11 9 8   CALCIUM  8.5* 8.2* 8.0* 8.1*  CREATININE 1.44* 1.27* 1.31* 1.26*  GFRNONAA 47* 55* 53* 56*  GFRAA 55* >60 >60 >60    LIVER FUNCTION TESTS:  Recent Labs  12/25/16 1129 01/10/17 0119  BILITOT 2.4* 1.1  AST 30 20  ALT 13* 30  ALKPHOS 47 85  PROT 5.9* 6.2*  ALBUMIN 3.3* 3.1*    TUMOR MARKERS: No results for input(s): AFPTM, CEA, CA199, CHROMGRNA in the last 8760 hours.  Assessment and Plan: 71 y.o. male with history of left nephrectomy in 2017 for renal cell carcinoma as well as laparoscopic 3 peritoneal ventral incisional hernia repair with mesh on 12/23/16. He subsequently underwent laparoscopy with Vicryl mesh placement for SBO/ herniated bowel within the retrorectus space on 01/10/17. Request now received for ultrasound-guided aspiration of abdominal  wall seroma postop and patient presents today for procedure.Risks and benefits discussed with the patient/wife including, but not limited to bleeding, infection, damage to adjacent structures or low yield requiring additional tests.All of the patient's questions were answered, patient is agreeable to proceed.Consent signed and in chart.      Thank you for this interesting consult.  I greatly enjoyed meeting Eric Suarez and look forward to participating in their care.  A copy of this report was sent to the requesting provider on this date.  Electronically Signed: D. Rowe Robert, PA-C 05/21/2017, 11:16 AM    I spent a total of 25 minutes  in face to face in clinical consultation, greater than 50% of which was counseling/coordinating care for ultrasound-guided aspiration of abdominal wall seroma

## 2017-05-21 NOTE — Sedation Documentation (Signed)
Patient denies pain and is resting comfortably.  

## 2017-05-21 NOTE — Discharge Instructions (Addendum)
Moderate Conscious Sedation, Adult, Care After °These instructions provide you with information about caring for yourself after your procedure. Your health care provider may also give you more specific instructions. Your treatment has been planned according to current medical practices, but problems sometimes occur. Call your health care provider if you have any problems or questions after your procedure. °What can I expect after the procedure? °After your procedure, it is common: °· To feel sleepy for several hours. °· To feel clumsy and have poor balance for several hours. °· To have poor judgment for several hours. °· To vomit if you eat too soon. ° °Follow these instructions at home: °For at least 24 hours after the procedure: ° °· Do not: °? Participate in activities where you could fall or become injured. °? Drive. °? Use heavy machinery. °? Drink alcohol. °? Take sleeping pills or medicines that cause drowsiness. °? Make important decisions or sign legal documents. °? Take care of children on your own. °· Rest. °Eating and drinking °· Follow the diet recommended by your health care provider. °· If you vomit: °? Drink water, juice, or soup when you can drink without vomiting. °? Make sure you have little or no nausea before eating solid foods. °General instructions °· Have a responsible adult stay with you until you are awake and alert. °· Take over-the-counter and prescription medicines only as told by your health care provider. °· If you smoke, do not smoke without supervision. °· Keep all follow-up visits as told by your health care provider. This is important. °Contact a health care provider if: °· You keep feeling nauseous or you keep vomiting. °· You feel light-headed. °· You develop a rash. °· You have a fever. °Get help right away if: °· You have trouble breathing. °This information is not intended to replace advice given to you by your health care provider. Make sure you discuss any questions you have  with your health care provider. °Document Released: 08/18/2013 Document Revised: 04/01/2016 Document Reviewed: 02/17/2016 °Elsevier Interactive Patient Education © 2018 [employer] ° ° °Needle Biopsy, Care After °These instructions give you information about caring for yourself after your procedure. Your doctor may also give you more specific instructions. Call your doctor if you have any problems or questions after your procedure. °Follow these instructions at home: °· Rest as told by your doctor. °· Take medicines only as told by your doctor. °· There are many different ways to close and cover the biopsy site, including stitches (sutures), skin glue, and adhesive strips. Follow instructions from your doctor about: °? How to take care of your biopsy site. °? When and how you should change your bandage (dressing). °? When you should remove your dressing. °? Removing whatever was used to close your biopsy site. °· Check your biopsy site every day for signs of infection. Watch for: °? Redness, swelling, or pain. °? Fluid, blood, or pus. °Contact a doctor if: °· You have a fever. °· You have redness, swelling, or pain at the biopsy site, and it lasts longer than a few days. °· You have fluid, blood, or pus coming from the biopsy site. °· You feel sick to your stomach (nauseous). °· You throw up (vomit). °Get help right away if: °· You are short of breath. °· You have trouble breathing. °· Your chest hurts. °· You feel dizzy or you pass out (faint). °· You have bleeding that does not stop with pressure or a bandage. °· You cough up blood. °·   Your belly (abdomen) hurts. °This information is not intended to replace advice given to you by your health care provider. Make sure you discuss any questions you have with your health care provider. °Document Released: 10/10/2008 Document Revised: 04/04/2016 Document Reviewed: 10/24/2014 °Elsevier Interactive Patient Education © 2018 [employer] ° °

## 2017-05-21 NOTE — Procedures (Signed)
Interventional Radiology Procedure Note  Procedure: US guided aspiration of abdominal wall seroma  Complications: None  Estimated Blood Loss: < 10 mL  Complex seroma with only small pockets of fluid yielding 10 mL of blood-tinged fluid.  Fluid sent for culture.  Venetia Night. Kathlene Cote, M.D Pager:  [phone]

## 2017-05-23 DIAGNOSIS — M5416 Radiculopathy, lumbar region: Secondary | ICD-10-CM | POA: Diagnosis not present

## 2017-05-26 LAB — AEROBIC/ANAEROBIC CULTURE W GRAM STAIN (SURGICAL/DEEP WOUND): Special Requests: NORMAL

## 2017-05-26 LAB — AEROBIC/ANAEROBIC CULTURE (SURGICAL/DEEP WOUND): CULTURE: NO GROWTH

## 2017-05-27 DIAGNOSIS — Z135 Encounter for screening for eye and ear disorders: Secondary | ICD-10-CM | POA: Diagnosis not present

## 2017-05-28 DIAGNOSIS — M5416 Radiculopathy, lumbar region: Secondary | ICD-10-CM | POA: Diagnosis not present

## 2017-05-28 DIAGNOSIS — M48061 Spinal stenosis, lumbar region without neurogenic claudication: Secondary | ICD-10-CM | POA: Diagnosis not present

## 2017-06-16 ENCOUNTER — Ambulatory Visit: Payer: Self-pay | Admitting: General Surgery

## 2017-06-16 DIAGNOSIS — K432 Incisional hernia without obstruction or gangrene: Secondary | ICD-10-CM | POA: Diagnosis not present

## 2017-06-16 NOTE — H&P (Signed)
History of Present Illness Ralene Ok MD; 06/16/2017 10:29 AM) The patient is a 71 year old male who presents with an incisional hernia. 71 year old male who comes back in today after aspiration of abdominal wall seroma. This was minimal and only had about 5-10 cc aspirated. Patient states he still notices the bulge to the right paramedian area. Patient has had some discomfort to the abdominal wall region. He is fairly active and does some mowing. He has no modifying factors. He states severity is minimal. He does have what appears to be some neurological issues of his left lower extremity. This is being worked up by Dr. Arnoldo Morale.  Previous surgery was a laparoscopic E TEP with retrorectus repair.   Allergies (Tanisha A. Owens Shark, [place]; 06/16/2017 9:55 AM) Tamsulosin HCl *GENITOURINARY AGENTS - MISCELLANEOUS*  Nausea, Vomiting. Sulfa Antibiotics  Rash. Allergies Reconciled   Medication History (Tanisha A. Owens Shark, [place]; 06/16/2017 9:55 AM) Alfuzosin HCl (10MG  Tablet ER, Oral) Active. Fish Oil (1200MG  Capsule, Oral) Active. Coenzyme Q10 (100MG  Capsule, Oral) Active. Synthroid (125MCG Tablet, Oral daily) Active. PARoxetine HCl ER (25MG  Tablet ER 24HR, Oral daily) Active. Atorvastatin Calcium (20MG  Tablet, Oral daily) Active. Atenolol-Chlorthalidone (50-25MG  Tablet, Oral daily) Active. Klor-Con M20 [facility] Tablet ER, Oral daily) Active. Medications Reconciled    Review of Systems Ralene Ok, MD; 06/16/2017 10:33 AM) All other systems negative  Vitals (Tanisha A. Brown RMA; 06/16/2017 9:55 AM) 06/16/2017 9:54 AM Weight: 205 lb Height: 75in Body Surface Area: 2.22 m Body Mass Index: 25.62 kg/m  Temp.: 38F  Pulse: 61 (Regular)  P.OX: 99% (Room air) BP: 120/82 (Sitting, Left Arm, Standard)       Physical Exam Ralene Ok MD; 06/16/2017 10:30 AM) The physical exam findings are as follows: Note:Constitutional: No acute distress, conversant, appears  stated age  Eyes: Anicteric sclerae, moist conjunctiva, no lid lag  Neck: No thyromegaly, trachea midline, no cervical lymphadenopathy  Lungs: Clear to auscultation biilaterally, normal respiratory effot  Cardiovascular: regular rate & rhythm, no murmurs, no peripheal edema, pedal pulses 2+  GI: Soft, no masses or hepatosplenomegaly, non-tender to palpation  MSK: Normal gait, no clubbing cyanosis, edema  Skin: No rashes, palpation reveals normal skin turgor  Psychiatric: Appropriate judgment and insight, oriented to person, place, and time  Abdomen Inspection Hernias - Incisional - Reducible(Right paramedian recurrent incisional hernia. Approximately 8 x 8 cm.).    Assessment & Plan Ralene Ok MD; 06/16/2017 10:32 AM) Fatima Blank HERNIA, WITHOUT OBSTRUCTION OR GANGRENE (K43.2) Impression: Patient is a 71 year old male with a recurrent incisional hernia 1. The patient will like to proceed to the operating room for open incisional with mesh. This ureteral require a modified restenosis versus a possible rectus versus possible TAR  2. I discussed with the patient the signs and symptoms of incarceration and strangulation and the need to proceed to the ER should they occur.  3. I discussed with the patient the risks and benefits of the procedure to include but not limited to: Infection, bleeding, damage to surrounding structures, possible need for further surgery, possible nerve pain, and possible recurrence. The patient was understanding and wishes to proceed.

## 2017-06-30 DIAGNOSIS — I10 Essential (primary) hypertension: Secondary | ICD-10-CM | POA: Diagnosis not present

## 2017-06-30 DIAGNOSIS — M5416 Radiculopathy, lumbar region: Secondary | ICD-10-CM | POA: Diagnosis not present

## 2017-07-08 DIAGNOSIS — E7800 Pure hypercholesterolemia, unspecified: Secondary | ICD-10-CM | POA: Diagnosis not present

## 2017-07-08 DIAGNOSIS — F419 Anxiety disorder, unspecified: Secondary | ICD-10-CM | POA: Diagnosis not present

## 2017-07-08 DIAGNOSIS — K429 Umbilical hernia without obstruction or gangrene: Secondary | ICD-10-CM | POA: Diagnosis not present

## 2017-07-08 DIAGNOSIS — K219 Gastro-esophageal reflux disease without esophagitis: Secondary | ICD-10-CM | POA: Diagnosis not present

## 2017-07-08 DIAGNOSIS — E039 Hypothyroidism, unspecified: Secondary | ICD-10-CM | POA: Diagnosis not present

## 2017-07-08 DIAGNOSIS — C642 Malignant neoplasm of left kidney, except renal pelvis: Secondary | ICD-10-CM | POA: Diagnosis not present

## 2017-07-08 DIAGNOSIS — N400 Enlarged prostate without lower urinary tract symptoms: Secondary | ICD-10-CM | POA: Diagnosis not present

## 2017-07-17 ENCOUNTER — Other Ambulatory Visit: Payer: Self-pay | Admitting: Neurosurgery

## 2017-07-17 DIAGNOSIS — M5416 Radiculopathy, lumbar region: Secondary | ICD-10-CM

## 2017-07-28 ENCOUNTER — Ambulatory Visit
Admission: RE | Admit: 2017-07-28 | Discharge: 2017-07-28 | Disposition: A | Discharge status: Home / Self Care | Payer: PPO | Source: Ambulatory Visit | Attending: Neurosurgery | Admitting: Neurosurgery

## 2017-07-28 DIAGNOSIS — M5416 Radiculopathy, lumbar region: Secondary | ICD-10-CM

## 2017-07-28 DIAGNOSIS — M5126 Other intervertebral disc displacement, lumbar region: Secondary | ICD-10-CM | POA: Diagnosis not present

## 2017-07-28 MED ORDER — ONDANSETRON HCL 4 MG/2ML IJ SOLN: 4.0000 mg | Freq: Four times a day (QID) | INTRAMUSCULAR | Status: DC | PRN

## 2017-07-28 MED ORDER — IOPAMIDOL (ISOVUE-M 200) INJECTION 41%
15.0000 mL | Freq: Once | INTRAMUSCULAR | Status: AC
  Administered 2017-07-28: 15 mL via INTRATHECAL

## 2017-07-28 MED ORDER — DIAZEPAM 5 MG PO TABS
5.0000 mg | ORAL_TABLET | Freq: Once | ORAL | Status: AC
  Administered 2017-07-28: 5 mg via ORAL

## 2017-07-28 NOTE — Progress Notes (Signed)
Pt states last dose of Paxil on Thursday last week.

## 2017-07-28 NOTE — Discharge Instructions (Signed)
Myelogram Discharge Instructions  1. Go home and rest quietly for the next 24 hours.  It is important to lie flat for the next 24 hours.  Get up only to go to the restroom.  You may lie in the bed or on a couch on your back, your stomach, your left side or your right side.  You may have one pillow under your head.  You may have pillows between your knees while you are on your side or under your knees while you are on your back.  2. DO NOT drive today.  Recline the seat as far back as it will go, while still wearing your seat belt, on the way home.  3. You may get up to go to the bathroom as needed.  You may sit up for 10 minutes to eat.  You may resume your normal diet and medications unless otherwise indicated.  Drink lots of extra fluids today and tomorrow.  4. The incidence of headache, nausea, or vomiting is about 5% (one in 20 patients).  If you develop a headache, lie flat and drink plenty of fluids until the headache goes away.  Caffeinated beverages may be helpful.  If you develop severe nausea and vomiting or a headache that does not go away with flat bed rest, call [phone].  5. You may resume normal activities after your 24 hours of bed rest is over; however, do not exert yourself strongly or do any heavy lifting tomorrow. If when you get up you have a headache when standing, go back to bed and force fluids for another 24 hours.  6. Call your physician for a follow-up appointment.  The results of your myelogram will be sent directly to your physician by the following day.  7. If you have any questions or if complications develop after you arrive home, please call [phone].  Discharge instructions have been explained to the patient.  The patient, or the person responsible for the patient, fully understands these instructions.       May resume Paxil on Sept. 18, 2018, after 1:00 pm.

## 2017-08-06 ENCOUNTER — Ambulatory Visit (INDEPENDENT_AMBULATORY_CARE_PROVIDER_SITE_OTHER): Payer: PPO | Admitting: Orthopedic Surgery

## 2017-08-06 ENCOUNTER — Encounter (INDEPENDENT_AMBULATORY_CARE_PROVIDER_SITE_OTHER): Payer: Self-pay | Admitting: Orthopedic Surgery

## 2017-08-06 ENCOUNTER — Ambulatory Visit (INDEPENDENT_AMBULATORY_CARE_PROVIDER_SITE_OTHER): Payer: PPO

## 2017-08-06 DIAGNOSIS — G8929 Other chronic pain: Secondary | ICD-10-CM

## 2017-08-06 DIAGNOSIS — M25561 Pain in right knee: Secondary | ICD-10-CM | POA: Diagnosis not present

## 2017-08-09 DIAGNOSIS — M25561 Pain in right knee: Secondary | ICD-10-CM

## 2017-08-09 DIAGNOSIS — G8929 Other chronic pain: Secondary | ICD-10-CM | POA: Diagnosis not present

## 2017-08-09 MED ORDER — BUPIVACAINE HCL 0.25 % IJ SOLN
4.0000 mL | INTRAMUSCULAR | Status: AC | PRN
  Administered 2017-08-09: 4 mL via INTRA_ARTICULAR

## 2017-08-09 MED ORDER — METHYLPREDNISOLONE ACETATE 40 MG/ML IJ SUSP
40.0000 mg | INTRAMUSCULAR | Status: AC | PRN
  Administered 2017-08-09: 40 mg via INTRA_ARTICULAR

## 2017-08-09 MED ORDER — LIDOCAINE HCL 1 % IJ SOLN
5.0000 mL | INTRAMUSCULAR | Status: AC | PRN
  Administered 2017-08-09: 5 mL

## 2017-08-09 NOTE — Progress Notes (Signed)
Office Visit Note   Patient: Eric Suarez           Date of Birth: Apr 13, 1946           MRN: [id number] Visit Date: 08/06/2017 Requested by: Anda Kraft, MD [address] [place], [place] [address] PCP: Deland Pretty, MD  Subjective: Chief Complaint  Patient presents with  . Knee Pain    bilateral but right worse than left    HPI: Eric Suarez is a 71 year old patient with bilateral knee pain right worse than left.  Previous injection helped in November 2016.  Describes her current pain 2 weeks ago.  He is unsure if he twists his knee the wrong way.  Describes swelling but no locking reports some popping in both knees.  He has had an MRI and nerve conduction study which shows that his left-sided sciatic nerve is irritated but currently his right knee is most symptomatic.  He is taking some over-the-counter medication.              ROS: All systems reviewed are negative as they relate to the chief complaint within the history of present illness.  Patient denies  fevers or chills.   Assessment & Plan: Visit Diagnoses:  1. Chronic pain of right knee     Plan: Impression is bilateral knee pain right worse than left with significant patellofemoral arthritis.  Plan is to inject the knee today.  4 week return and we will decide for greater than against MRI scanning at that time  Follow-Up Instructions: No Follow-up on file.   Orders:  Orders Placed This Encounter  Procedures  . XR Knee 1-2 Views Right   No orders of the defined types were placed in this encounter.     Procedures: Large Joint Inj Date/Time: 08/09/2017 3:00 PM Performed by: Meredith Pel Authorized by: Meredith Pel   Consent Given by:  Patient Site marked: the procedure site was marked   Timeout: prior to procedure the correct patient, procedure, and site was verified   Indications:  Pain, joint swelling and diagnostic evaluation Location:  Knee Site:  R knee Prep: patient was  prepped and draped in usual sterile fashion   Needle Size:  18 G Needle Length:  1.5 inches Approach:  Superolateral Ultrasound Guidance: No   Fluoroscopic Guidance: No   Arthrogram: No   Medications:  5 mL lidocaine 1 %; 4 mL bupivacaine 0.25 %; 40 mg methylPREDNISolone acetate 40 MG/ML Patient tolerance:  Patient tolerated the procedure well with no immediate complications     Clinical Data: No additional findings.  Objective: Vital Signs: There were no vitals taken for this visit.  Physical Exam:   Constitutional: Patient appears well-developed HEENT:  Head: Normocephalic Eyes:EOM are normal Neck: Normal range of motion Cardiovascular: Normal rate Pulmonary/chest: Effort normal Neurologic: Patient is alert Skin: Skin is warm Psychiatric: Patient has normal mood and affect    Ortho Exam: Orthopedic exam demonstrates trace effusion in both knees with patellofemoral crepitus present.  Collateral cruciate ligaments are stable.  Pedal pulses palpable.  No other masses lymph adenopathy or skin changes noted in the knee region.  Mild medial and lateral joint space tenderness is noted.  Specialty Comments:  No specialty comments available.  Imaging: No results found.   PMFS History: Patient Active Problem List   Diagnosis Date Noted  . Folliculitis 84/69/6295  . SBO (small bowel obstruction) ([place]) 01/10/2017  . Post-operative complication 28/41/3244  . Severe sepsis ([place]) 12/25/2016  .  Flu-like symptoms 12/25/2016  . Dehydration with hyponatremia 12/25/2016  . AKI (acute kidney injury) ([place]) 12/25/2016  . CKD (chronic kidney disease) stage 3, GFR 30-59 ml/min 12/25/2016  . S/P repair of ventral hernia 12/25/2016  . Hypothyroidism 12/25/2016  . HTN (hypertension) 12/25/2016  . Neoplasm of left kidney 04/18/2016  . Postoperative examination 03/07/2016  . Strangulated inguinal hernia 02/29/2016   Past Medical History:  Diagnosis Date  . Anxiety   . Arthritis     . Cancer [facility]) [id number]   thryoid, left kidney  . Cataracts, bilateral   . Complication of anesthesia    unable to void after surgery  . Coronary artery disease    pt denies- had stress>10 yrs ago due to"get winded walking uphill'- neg  . GERD (gastroesophageal reflux disease)   . History of hiatal hernia   . History of kidney surgery    left kidney removed due to cancer 2017  . Hypertension   . Hypothyroidism   . Left renal mass   . Macular degeneration of both eyes    dry  . PONV (postoperative nausea and vomiting)     No family history on file.  Past Surgical History:  Procedure Laterality Date  . APPENDECTOMY    . CYSTOSCOPY WITH RETROGRADE PYELOGRAM, URETEROSCOPY AND STENT PLACEMENT Left 04/01/2016   Procedure: CYSTOSCOPY WITH RETROGRADE PYELOGRAM, left ureter;  Surgeon: Raynelle Bring, MD;  [facility];  Service: Urology;  Laterality: Left;  . HEMORRHOID SURGERY    . HERNIA REPAIR     inguinal-3 on left , 1 on right  . INCISIONAL HERNIA REPAIR N/A 12/23/2016   Procedure: LAPAROSCOPIC VERSUS OPEN LYSIS OF ADHESIONS AND INCISIONAL HERNIA REPAIR WITH MESH;  Surgeon: Ralene Ok, MD;  Location: [place];  Service: General;  Laterality: N/A;  . INSERTION OF MESH N/A 12/23/2016   Procedure: INSERTION OF MESH;  Surgeon: Ralene Ok, MD;  Location: [place];  Service: General;  Laterality: N/A;  . INSERTION OF MESH N/A 01/10/2017   Procedure: INSERTION OF MESH;  Surgeon: Ralene Ok, MD;  Location: [place];  Service: General;  Laterality: N/A;  . LAPAROSCOPIC NEPHRECTOMY Left 04/18/2016   Procedure: LAPAROSCOPIC RADICAL  LEFT NEPHRECTOMY;  Surgeon: Raynelle Bring, MD;  [facility];  Service: Urology;  Laterality: Left;  . LAPAROSCOPY N/A 01/10/2017   Procedure: LAPAROSCOPY DIAGNOSTIC , POSSIBLE LAPAROTOMY;  Surgeon: Ralene Ok, MD;  Location: [place];  Service: General;  Laterality: N/A;  . NASAL SINUS SURGERY     left side  . THYROIDECTOMY, PARTIAL     left small  amount on left side-for cancer  . TONSILLECTOMY     Social History   Occupational History  . Not on file.   Social History Main Topics  . Smoking status: Never Smoker  . Smokeless tobacco: Never Used  . Alcohol use No  . Drug use: No  . Sexual activity: Not on file

## 2017-08-26 DIAGNOSIS — M5416 Radiculopathy, lumbar region: Secondary | ICD-10-CM | POA: Diagnosis not present

## 2017-08-26 DIAGNOSIS — Z6826 Body mass index (BMI) 26.0-26.9, adult: Secondary | ICD-10-CM | POA: Diagnosis not present

## 2017-08-28 DIAGNOSIS — Z0000 Encounter for general adult medical examination without abnormal findings: Secondary | ICD-10-CM | POA: Diagnosis not present

## 2017-08-28 DIAGNOSIS — E559 Vitamin D deficiency, unspecified: Secondary | ICD-10-CM | POA: Diagnosis not present

## 2017-08-28 DIAGNOSIS — I10 Essential (primary) hypertension: Secondary | ICD-10-CM | POA: Diagnosis not present

## 2017-08-28 DIAGNOSIS — E7800 Pure hypercholesterolemia, unspecified: Secondary | ICD-10-CM | POA: Diagnosis not present

## 2017-08-28 DIAGNOSIS — N390 Urinary tract infection, site not specified: Secondary | ICD-10-CM | POA: Diagnosis not present

## 2017-09-04 ENCOUNTER — Encounter (INDEPENDENT_AMBULATORY_CARE_PROVIDER_SITE_OTHER): Payer: Self-pay | Admitting: Orthopedic Surgery

## 2017-09-04 ENCOUNTER — Ambulatory Visit (INDEPENDENT_AMBULATORY_CARE_PROVIDER_SITE_OTHER): Payer: PPO | Admitting: Orthopedic Surgery

## 2017-09-04 DIAGNOSIS — G8929 Other chronic pain: Secondary | ICD-10-CM | POA: Diagnosis not present

## 2017-09-04 DIAGNOSIS — M25562 Pain in left knee: Secondary | ICD-10-CM

## 2017-09-04 DIAGNOSIS — M25561 Pain in right knee: Secondary | ICD-10-CM

## 2017-09-04 NOTE — Progress Notes (Signed)
Office Visit Note   Patient: Eric Suarez           Date of Birth: [birth year]           MRN: [id number] Visit Date: 09/04/2017 Requested by: Deland Pretty, MD [address] [place], [place] [address] PCP: Deland Pretty, MD  Subjective: Chief Complaint  Patient presents with  . Right Knee - Pain, Follow-up  . Left Knee - Pain, Follow-up    HPI: Eric Suarez is a 71 year old patient with right knee pain.  Had the knee aspirated and injected 1 month ago.  Had some bloody aspirate at that time.  He states that he is better.  Reports decreased swelling and decreased tightness.  Still has difficulty going up stairs and has to grab the handrail.  Neurosurgery is recommending back injections for Eric Suarez.              ROS: All systems reviewed are negative as they relate to the chief complaint within the history of present illness.  Patient denies  fevers or chills.   Assessment & Plan: Visit Diagnoses:  1. Chronic pain of both knees     Plan: Impression is recurrent effusion and pain going up and down steps in a patient who prior to this event was pretty well-functioning with his right knee.  The presence of blood in the knee last clinic visit indicates potential for treatable internal derangement.  I recommend MRI scanning at this time for evaluation to see if there is a discretely treatable meniscal tear or chondral flap which could be causing his symptoms.  I will see him back after that study  Follow-Up Instructions: Return for after MRI.   Orders:  No orders of the defined types were placed in this encounter.  No orders of the defined types were placed in this encounter.     Procedures: No procedures performed   Clinical Data: No additional findings.  Objective: Vital Signs: There were no vitals taken for this visit.  Physical Exam:   Constitutional: Patient appears well-developed HEENT:  Head: Normocephalic Eyes:EOM are normal Neck: Normal range of  motion Cardiovascular: Normal rate Pulmonary/chest: Effort normal Neurologic: Patient is alert Skin: Skin is warm Psychiatric: Patient has normal mood and affect    Ortho Exam: Orthopedic exam demonstrates full active and passive range of motion of the right knee.  He does have a mild effusion.  Collateral and cruciate ligaments are stable.  Pedal pulses palpable.  There is no focal joint line tenderness.  No groin pain with internal/external rotation of the leg.  Specialty Comments:  No specialty comments available.  Imaging: No results found.   PMFS History: Patient Active Problem List   Diagnosis Date Noted  . Chronic pain of both knees 09/04/2017  . Folliculitis 88/41/6606  . SBO (small bowel obstruction) ([place]) 01/10/2017  . Post-operative complication 30/16/0109  . Severe sepsis ([place]) 12/25/2016  . Flu-like symptoms 12/25/2016  . Dehydration with hyponatremia 12/25/2016  . AKI (acute kidney injury) ([place]) 12/25/2016  . CKD (chronic kidney disease) stage 3, GFR 30-59 ml/min ([facility]) 12/25/2016  . S/P repair of ventral hernia 12/25/2016  . Hypothyroidism 12/25/2016  . HTN (hypertension) 12/25/2016  . Neoplasm of left kidney 04/18/2016  . Postoperative examination 03/07/2016  . Strangulated inguinal hernia 02/29/2016   Past Medical History:  Diagnosis Date  . Anxiety   . Arthritis   . Cancer [facility]) [id number]   thryoid, left kidney  . Cataracts, bilateral   . Complication  of anesthesia    unable to void after surgery  . Coronary artery disease    pt denies- had stress>10 yrs ago due to"get winded walking uphill'- neg  . GERD (gastroesophageal reflux disease)   . History of hiatal hernia   . History of kidney surgery    left kidney removed due to cancer 2017  . Hypertension   . Hypothyroidism   . Left renal mass   . Macular degeneration of both eyes    dry  . PONV (postoperative nausea and vomiting)     No family history on file.  Past Surgical History:   Procedure Laterality Date  . APPENDECTOMY    . CYSTOSCOPY WITH RETROGRADE PYELOGRAM, URETEROSCOPY AND STENT PLACEMENT Left 04/01/2016   Procedure: CYSTOSCOPY WITH RETROGRADE PYELOGRAM, left ureter;  Surgeon: Raynelle Bring, MD;  [facility];  Service: Urology;  Laterality: Left;  . HEMORRHOID SURGERY    . HERNIA REPAIR     inguinal-3 on left , 1 on right  . INCISIONAL HERNIA REPAIR N/A 12/23/2016   Procedure: LAPAROSCOPIC VERSUS OPEN LYSIS OF ADHESIONS AND INCISIONAL HERNIA REPAIR WITH MESH;  Surgeon: Ralene Ok, MD;  Location: [place];  Service: General;  Laterality: N/A;  . INSERTION OF MESH N/A 12/23/2016   Procedure: INSERTION OF MESH;  Surgeon: Ralene Ok, MD;  Location: [place];  Service: General;  Laterality: N/A;  . INSERTION OF MESH N/A 01/10/2017   Procedure: INSERTION OF MESH;  Surgeon: Ralene Ok, MD;  Location: [place];  Service: General;  Laterality: N/A;  . LAPAROSCOPIC NEPHRECTOMY Left 04/18/2016   Procedure: LAPAROSCOPIC RADICAL  LEFT NEPHRECTOMY;  Surgeon: Raynelle Bring, MD;  [facility];  Service: Urology;  Laterality: Left;  . LAPAROSCOPY N/A 01/10/2017   Procedure: LAPAROSCOPY DIAGNOSTIC , POSSIBLE LAPAROTOMY;  Surgeon: Ralene Ok, MD;  Location: [place];  Service: General;  Laterality: N/A;  . NASAL SINUS SURGERY     left Suarez  . THYROIDECTOMY, PARTIAL     left small amount on left Suarez-for cancer  . TONSILLECTOMY     Social History   Occupational History  . Not on file.   Social History Main Topics  . Smoking status: Never Smoker  . Smokeless tobacco: Never Used  . Alcohol use No  . Drug use: No  . Sexual activity: Not on file

## 2017-09-05 NOTE — Addendum Note (Signed)
Addended by: Brand Males E on: 09/05/2017 08:37 AM   Modules accepted: Orders

## 2017-09-08 DIAGNOSIS — F419 Anxiety disorder, unspecified: Secondary | ICD-10-CM | POA: Diagnosis not present

## 2017-09-08 DIAGNOSIS — E7800 Pure hypercholesterolemia, unspecified: Secondary | ICD-10-CM | POA: Diagnosis not present

## 2017-09-08 DIAGNOSIS — Z0000 Encounter for general adult medical examination without abnormal findings: Secondary | ICD-10-CM | POA: Diagnosis not present

## 2017-09-08 DIAGNOSIS — I10 Essential (primary) hypertension: Secondary | ICD-10-CM | POA: Diagnosis not present

## 2017-09-08 DIAGNOSIS — E039 Hypothyroidism, unspecified: Secondary | ICD-10-CM | POA: Diagnosis not present

## 2017-09-18 ENCOUNTER — Ambulatory Visit: Payer: Self-pay | Admitting: General Surgery

## 2017-09-18 NOTE — H&P (View-Only) (Signed)
History of Present Illness Ralene Ok MD; 06/16/2017 10:29 AM) The patient is a 71 year old male who presents with an incisional hernia. 71 year old male who comes back in today after aspiration of abdominal wall seroma. This was minimal and only had about 5-10 cc aspirated. Patient states he still notices the bulge to the right paramedian area. Patient has had some discomfort to the abdominal wall region. He is fairly active and does some mowing. He has no modifying factors. He states severity is minimal. He does have what appears to be some neurological issues of his left lower extremity. This is being worked up by Dr. Arnoldo Morale.  Previous surgery was a laparoscopic E TEP with retrorectus repair.  Past Medical History:  Diagnosis Date  . Anxiety   . Arthritis   . Cancer [facility]) [id number]   thryoid, left kidney  . Cataracts, bilateral   . Complication of anesthesia    unable to void after surgery  . Coronary artery disease    pt denies- had stress>10 yrs ago due to"get winded walking uphill'- neg  . GERD (gastroesophageal reflux disease)   . History of hiatal hernia   . History of kidney surgery    left kidney removed due to cancer 2017  . Hypertension   . Hypothyroidism   . Left renal mass   . Macular degeneration of both eyes    dry  . PONV (postoperative nausea and vomiting)      Allergies (Tanisha A. Owens Shark, [place]; 06/16/2017 9:55 AM) Tamsulosin HCl *GENITOURINARY AGENTS - MISCELLANEOUS*  Nausea, Vomiting. Sulfa Antibiotics  Rash. Allergies Reconciled   Medication History (Tanisha A. Owens Shark, [place]; 06/16/2017 9:55 AM) Alfuzosin HCl (10MG  Tablet ER, Oral) Active. Fish Oil (1200MG  Capsule, Oral) Active. Coenzyme Q10 (100MG  Capsule, Oral) Active. Synthroid (125MCG Tablet, Oral daily) Active. PARoxetine HCl ER (25MG  Tablet ER 24HR, Oral daily) Active. Atorvastatin Calcium (20MG  Tablet, Oral daily) Active. Atenolol-Chlorthalidone (50-25MG  Tablet, Oral daily)  Active. Klor-Con M20 [facility] Tablet ER, Oral daily) Active. Medications Reconciled    Review of Systems Ralene Ok, MD; 06/16/2017 10:33 AM) All other systems negative  Vitals (Tanisha A. Brown RMA; 06/16/2017 9:55 AM) 06/16/2017 9:54 AM Weight: 205 lb Height: 75in Body Surface Area: 2.22 m Body Mass Index: 25.62 kg/m  Temp.: 50F  Pulse: 61 (Regular)  P.OX: 99% (Room air) BP: 120/82 (Sitting, Left Arm, Standard)       Physical Exam Ralene Ok MD; 06/16/2017 10:30 AM) The physical exam findings are as follows: Note:Constitutional: No acute distress, conversant, appears stated age  Eyes: Anicteric sclerae, moist conjunctiva, no lid lag  Neck: No thyromegaly, trachea midline, no cervical lymphadenopathy  Lungs: Clear to auscultation biilaterally, normal respiratory effot  Cardiovascular: regular rate & rhythm, no murmurs, no peripheal edema, pedal pulses 2+  GI: Soft, no masses or hepatosplenomegaly, non-tender to palpation  MSK: Normal gait, no clubbing cyanosis, edema  Skin: No rashes, palpation reveals normal skin turgor  Psychiatric: Appropriate judgment and insight, oriented to person, place, and time  Abdomen Inspection Hernias - Incisional - Reducible(Right paramedian recurrent incisional hernia. Approximately 8 x 8 cm.).    Assessment & Plan Ralene Ok MD; 06/16/2017 10:32 AM) Fatima Blank HERNIA, WITHOUT OBSTRUCTION OR GANGRENE (K43.2) Impression: Patient is a 71 year old male with a recurrent incisional hernia 1. The patient will like to proceed to the operating room for open incisional with mesh. This ureteral require a modified restenosis versus a possible rectus versus possible TAR  2. I discussed with the patient the signs and  symptoms of incarceration and strangulation and the need to proceed to the ER should they occur.  3. I discussed with the patient the risks and benefits of the procedure to include but not limited to:  Infection, bleeding, damage to surrounding structures, possible need for further surgery, possible nerve pain, and possible recurrence. The patient was understanding and wishes to proceed.

## 2017-09-18 NOTE — H&P (Signed)
History of Present Illness Ralene Ok MD; 06/16/2017 10:29 AM) The patient is a 71 year old male who presents with an incisional hernia. 71 year old male who comes back in today after aspiration of abdominal wall seroma. This was minimal and only had about 5-10 cc aspirated. Patient states he still notices the bulge to the right paramedian area. Patient has had some discomfort to the abdominal wall region. He is fairly active and does some mowing. He has no modifying factors. He states severity is minimal. He does have what appears to be some neurological issues of his left lower extremity. This is being worked up by Dr. Arnoldo Morale.  Previous surgery was a laparoscopic E TEP with retrorectus repair.  Past Medical History:  Diagnosis Date  . Anxiety   . Arthritis   . Cancer [facility]) [id number]   thryoid, left kidney  . Cataracts, bilateral   . Complication of anesthesia    unable to void after surgery  . Coronary artery disease    pt denies- had stress>10 yrs ago due to"get winded walking uphill'- neg  . GERD (gastroesophageal reflux disease)   . History of hiatal hernia   . History of kidney surgery    left kidney removed due to cancer 2017  . Hypertension   . Hypothyroidism   . Left renal mass   . Macular degeneration of both eyes    dry  . PONV (postoperative nausea and vomiting)      Allergies (Tanisha A. Owens Shark, [place]; 06/16/2017 9:55 AM) Tamsulosin HCl *GENITOURINARY AGENTS - MISCELLANEOUS*  Nausea, Vomiting. Sulfa Antibiotics  Rash. Allergies Reconciled   Medication History (Tanisha A. Owens Shark, [place]; 06/16/2017 9:55 AM) Alfuzosin HCl (10MG  Tablet ER, Oral) Active. Fish Oil (1200MG  Capsule, Oral) Active. Coenzyme Q10 (100MG  Capsule, Oral) Active. Synthroid (125MCG Tablet, Oral daily) Active. PARoxetine HCl ER (25MG  Tablet ER 24HR, Oral daily) Active. Atorvastatin Calcium (20MG  Tablet, Oral daily) Active. Atenolol-Chlorthalidone (50-25MG  Tablet, Oral daily)  Active. Klor-Con M20 [facility] Tablet ER, Oral daily) Active. Medications Reconciled    Review of Systems Ralene Ok, MD; 06/16/2017 10:33 AM) All other systems negative  Vitals (Tanisha A. Brown RMA; 06/16/2017 9:55 AM) 06/16/2017 9:54 AM Weight: 205 lb Height: 75in Body Surface Area: 2.22 m Body Mass Index: 25.62 kg/m  Temp.: 64F  Pulse: 61 (Regular)  P.OX: 99% (Room air) BP: 120/82 (Sitting, Left Arm, Standard)       Physical Exam Ralene Ok MD; 06/16/2017 10:30 AM) The physical exam findings are as follows: Note:Constitutional: No acute distress, conversant, appears stated age  Eyes: Anicteric sclerae, moist conjunctiva, no lid lag  Neck: No thyromegaly, trachea midline, no cervical lymphadenopathy  Lungs: Clear to auscultation biilaterally, normal respiratory effot  Cardiovascular: regular rate & rhythm, no murmurs, no peripheal edema, pedal pulses 2+  GI: Soft, no masses or hepatosplenomegaly, non-tender to palpation  MSK: Normal gait, no clubbing cyanosis, edema  Skin: No rashes, palpation reveals normal skin turgor  Psychiatric: Appropriate judgment and insight, oriented to person, place, and time  Abdomen Inspection Hernias - Incisional - Reducible(Right paramedian recurrent incisional hernia. Approximately 8 x 8 cm.).    Assessment & Plan Ralene Ok MD; 06/16/2017 10:32 AM) Fatima Blank HERNIA, WITHOUT OBSTRUCTION OR GANGRENE (K43.2) Impression: Patient is a 71 year old male with a recurrent incisional hernia 1. The patient will like to proceed to the operating room for open incisional with mesh. This ureteral require a modified restenosis versus a possible rectus versus possible TAR  2. I discussed with the patient the signs and  symptoms of incarceration and strangulation and the need to proceed to the ER should they occur.  3. I discussed with the patient the risks and benefits of the procedure to include but not limited to:  Infection, bleeding, damage to surrounding structures, possible need for further surgery, possible nerve pain, and possible recurrence. The patient was understanding and wishes to proceed.

## 2017-09-24 DIAGNOSIS — R972 Elevated prostate specific antigen [PSA]: Secondary | ICD-10-CM | POA: Diagnosis not present

## 2017-09-24 DIAGNOSIS — N401 Enlarged prostate with lower urinary tract symptoms: Secondary | ICD-10-CM | POA: Diagnosis not present

## 2017-09-24 DIAGNOSIS — R3912 Poor urinary stream: Secondary | ICD-10-CM | POA: Diagnosis not present

## 2017-09-30 ENCOUNTER — Ambulatory Visit
Admission: RE | Admit: 2017-09-30 | Discharge: 2017-09-30 | Disposition: A | Discharge status: Home / Self Care | Payer: PPO | Source: Ambulatory Visit | Attending: Orthopedic Surgery | Admitting: Orthopedic Surgery

## 2017-09-30 DIAGNOSIS — M25561 Pain in right knee: Secondary | ICD-10-CM | POA: Diagnosis not present

## 2017-09-30 DIAGNOSIS — G8929 Other chronic pain: Secondary | ICD-10-CM

## 2017-09-30 DIAGNOSIS — M25562 Pain in left knee: Principal | ICD-10-CM

## 2017-10-01 DIAGNOSIS — M5416 Radiculopathy, lumbar region: Secondary | ICD-10-CM | POA: Diagnosis not present

## 2017-10-03 NOTE — Pre-Procedure Instructions (Signed)
Eric Suarez  10/03/2017      [id number] - [place], [place] [address] [place] [address] Phone: [phone] Fax: [phone]    Your procedure is scheduled on Nov 28  Report to [facility] at [phone].  Call this number if you have problems the morning of surgery:  [phone]   Remember:  Do not eat food or drink liquids after midnight.  Take these medicines the morning of surgery with A SIP OF WATER alfuzosin (Uroxatral), Atenolol-chlorthalidone (Tenoretic), dutasteride (Avodart), levothyroxine (Synthroid), Paroxetine (Paxil-CR)  Stop taking aspirin, BC's, Goody's, Herbal medications, Fish Oil, Vitamins, Ibuprofen, Advil, Motrin, Aleve, Co q10,   Do not wear jewelry, make-up or nail polish.  Do not wear lotions, powders, or perfumes, or deoderant.  Do not shave 48 hours prior to surgery.  Men may shave face and neck.  Do not bring valuables to the hospital.  [facility] is not responsible for any belongings or valuables.  Contacts, dentures or bridgework may not be worn into surgery.  Leave your suitcase in the car.  After surgery it may be brought to your room.  For patients admitted to the hospital, discharge time will be determined by your treatment team.  Patients discharged the day of surgery will not be allowed to drive home.    Special instructions:  [place] - Preparing for Surgery  Before surgery, you can play an important role.  Because skin is not sterile, your skin needs to be as free of germs as possible.  You can reduce the number of germs on you skin by washing with CHG (chlorahexidine gluconate) soap before surgery.  CHG is an antiseptic cleaner which kills germs and bonds with the skin to continue killing germs even after washing.  Please DO NOT use if you have an allergy to CHG or antibacterial soaps.  If your skin becomes reddened/irritated stop using the CHG and  inform your nurse when you arrive at Short Stay.  Do not shave (including legs and underarms) for at least 48 hours prior to the first CHG shower.  You may shave your face.  Please follow these instructions carefully:   1.  Shower with CHG Soap the night before surgery and the                                morning of Surgery.  2.  If you choose to wash your hair, wash your hair first as usual with your       normal shampoo.  3.  After you shampoo, rinse your hair and body thoroughly to remove the                      Shampoo.  4.  Use CHG as you would any other liquid soap.  You can apply chg directly       to the skin and wash gently with scrungie or a clean washcloth.  5.  Apply the CHG Soap to your body ONLY FROM THE NECK DOWN.        Do not use on open wounds or open sores.  Avoid contact with your eyes,       ears, mouth and genitals (private parts).  Wash genitals (private parts)       with your normal soap.  6.  Wash thoroughly,  paying special attention to the area where your surgery        will be performed.  7.  Thoroughly rinse your body with warm water from the neck down.  8.  DO NOT shower/wash with your normal soap after using and rinsing off       the CHG Soap.  9.  Pat yourself dry with a clean towel.            10.  Wear clean pajamas.            11.  Place clean sheets on your bed the night of your first shower and do not        sleep with pets.  Day of Surgery  Do not apply any lotions/deoderants the morning of surgery.  Please wear clean clothes to the hospital/surgery center.     Please read over the following fact sheets that you were given. Pain Booklet, Coughing and Deep Breathing and Surgical Site Infection Prevention

## 2017-10-06 ENCOUNTER — Encounter (HOSPITAL_COMMUNITY)
Admission: RE | Admit: 2017-10-06 | Discharge: 2017-10-06 | Disposition: A | Discharge status: Home / Self Care | Payer: PPO | Source: Ambulatory Visit | Attending: General Surgery | Admitting: General Surgery

## 2017-10-06 ENCOUNTER — Ambulatory Visit (INDEPENDENT_AMBULATORY_CARE_PROVIDER_SITE_OTHER): Payer: PPO | Admitting: Orthopedic Surgery

## 2017-10-06 ENCOUNTER — Encounter (HOSPITAL_COMMUNITY): Payer: Self-pay

## 2017-10-06 ENCOUNTER — Encounter (INDEPENDENT_AMBULATORY_CARE_PROVIDER_SITE_OTHER): Payer: Self-pay | Admitting: Orthopedic Surgery

## 2017-10-06 ENCOUNTER — Other Ambulatory Visit: Payer: Self-pay

## 2017-10-06 DIAGNOSIS — K432 Incisional hernia without obstruction or gangrene: Secondary | ICD-10-CM

## 2017-10-06 DIAGNOSIS — K219 Gastro-esophageal reflux disease without esophagitis: Secondary | ICD-10-CM | POA: Diagnosis present

## 2017-10-06 DIAGNOSIS — Z85850 Personal history of malignant neoplasm of thyroid: Secondary | ICD-10-CM | POA: Diagnosis not present

## 2017-10-06 DIAGNOSIS — I10 Essential (primary) hypertension: Secondary | ICD-10-CM | POA: Diagnosis present

## 2017-10-06 DIAGNOSIS — Z01818 Encounter for other preprocedural examination: Secondary | ICD-10-CM

## 2017-10-06 DIAGNOSIS — G8918 Other acute postprocedural pain: Secondary | ICD-10-CM | POA: Diagnosis not present

## 2017-10-06 DIAGNOSIS — Z905 Acquired absence of kidney: Secondary | ICD-10-CM | POA: Diagnosis not present

## 2017-10-06 DIAGNOSIS — E039 Hypothyroidism, unspecified: Secondary | ICD-10-CM | POA: Diagnosis present

## 2017-10-06 DIAGNOSIS — I2510 Atherosclerotic heart disease of native coronary artery without angina pectoris: Secondary | ICD-10-CM | POA: Diagnosis present

## 2017-10-06 DIAGNOSIS — M1711 Unilateral primary osteoarthritis, right knee: Secondary | ICD-10-CM | POA: Diagnosis not present

## 2017-10-06 DIAGNOSIS — F419 Anxiety disorder, unspecified: Secondary | ICD-10-CM | POA: Diagnosis present

## 2017-10-06 DIAGNOSIS — H3530 Unspecified macular degeneration: Secondary | ICD-10-CM | POA: Diagnosis present

## 2017-10-06 DIAGNOSIS — Z79899 Other long term (current) drug therapy: Secondary | ICD-10-CM | POA: Diagnosis not present

## 2017-10-06 DIAGNOSIS — I129 Hypertensive chronic kidney disease with stage 1 through stage 4 chronic kidney disease, or unspecified chronic kidney disease: Secondary | ICD-10-CM | POA: Diagnosis not present

## 2017-10-06 DIAGNOSIS — N183 Chronic kidney disease, stage 3 (moderate): Secondary | ICD-10-CM | POA: Diagnosis not present

## 2017-10-06 DIAGNOSIS — Z85528 Personal history of other malignant neoplasm of kidney: Secondary | ICD-10-CM | POA: Diagnosis not present

## 2017-10-06 LAB — BASIC METABOLIC PANEL
ANION GAP: 9 (ref 5–15)
BUN: 21 mg/dL — AB (ref 6–20)
CALCIUM: 8.8 mg/dL — AB (ref 8.9–10.3)
CO2: 21 mmol/L — AB (ref 22–32)
CREATININE: 1.44 mg/dL — AB (ref 0.61–1.24)
Chloride: 106 mmol/L (ref 101–111)
GFR calc Af Amer: 55 mL/min — ABNORMAL LOW (ref >60)
GFR calc non Af Amer: 47 mL/min — ABNORMAL LOW (ref >60)
GLUCOSE: 76 mg/dL (ref 65–99)
Potassium: 3.4 mmol/L — ABNORMAL LOW (ref 3.5–5.1)
Sodium: 136 mmol/L (ref 135–145)

## 2017-10-06 LAB — CBC
HCT: 42.5 % (ref 39.0–52.0)
Hemoglobin: 14.5 g/dL (ref 13.0–17.0)
MCH: 30.6 pg (ref 26.0–34.0)
MCHC: 34.1 g/dL (ref 30.0–36.0)
MCV: 89.7 fL (ref 78.0–100.0)
PLATELETS: 175 10*3/uL (ref 150–400)
RBC: 4.74 MIL/uL (ref 4.22–5.81)
RDW: 14.5 % (ref 11.5–15.5)
WBC: 8.5 10*3/uL (ref 4.0–10.5)

## 2017-10-06 NOTE — Progress Notes (Signed)
Office Visit Note   Patient: Eric Suarez           Date of Birth: [birth year]           MRN: [id number] Visit Date: 10/06/2017 Requested by: Deland Pretty, MD [address] [place], [place] [address] PCP: Deland Pretty, MD  Subjective: Chief Complaint  Patient presents with  . Right Knee - Follow-up    HPI: Eric Suarez is a 71 year old patient with right knee pain.  Since I have seen him he is had an MRI scan which is reviewed.  He has severe long-standing patellofemoral arthritis and a degenerative medial meniscal tear.  This findings are shown to the patient on the MRI scan.  The injection may have helped some.  Ice has been very helpful.  He is using the rail to get up and down the steps.              ROS: All systems reviewed are negative as they relate to the chief complaint within the history of present illness.  Patient denies  fevers or chills.   Assessment & Plan: Visit Diagnoses:  1. Unilateral primary osteoarthritis, right knee     Plan: Impression is nothing definitively fixable with the arthroscope in the right knee.  Severe patellofemoral arthritis which is long-standing is noted.  The medial meniscal tear is asymptomatic on physical examination and his likely degenerative in nature.  I would favor observation for now.  Continue with conservative measures.  Another injection may be indicated.  Discussed knee replacement and that could also be indicated at some time in the future when he becomes more symptomatic.  Follow-up as needed  Follow-Up Instructions: Return if symptoms worsen or fail to improve.   Orders:  No orders of the defined types were placed in this encounter.  No orders of the defined types were placed in this encounter.     Procedures: No procedures performed   Clinical Data: No additional findings.  Objective: Vital Signs: There were no vitals taken for this visit.  Physical Exam:   Constitutional: Patient appears  well-developed HEENT:  Head: Normocephalic Eyes:EOM are normal Neck: Normal range of motion Cardiovascular: Normal rate Pulmonary/chest: Effort normal Neurologic: Patient is alert Skin: Skin is warm Psychiatric: Patient has normal mood and affect    Ortho Exam: Orthopedic exam demonstrates that the patient walks without assistive devices but has a cane available.  Knee range of motion demonstrates bilateral patellofemoral crepitus with lateral subluxation of the patella.  Collateral crucial ligaments are stable on the right knee but there is no effusion.  There is no focal medial or lateral joint line tenderness.  Pedal pulses palpable.  No groin pain with internal/external rotation of the leg.  Specialty Comments:  No specialty comments available.  Imaging: No results found.   PMFS History: Patient Active Problem List   Diagnosis Date Noted  . Chronic pain of both knees 09/04/2017  . Folliculitis 41/66/0630  . SBO (small bowel obstruction) ([facility]) 01/10/2017  . Post-operative complication 16/11/930  . Severe sepsis ([facility]) 12/25/2016  . Flu-like symptoms 12/25/2016  . Dehydration with hyponatremia 12/25/2016  . AKI (acute kidney injury) ([facility]) 12/25/2016  . CKD (chronic kidney disease) stage 3, GFR 30-59 ml/min ([facility]) 12/25/2016  . S/P repair of ventral hernia 12/25/2016  . Hypothyroidism 12/25/2016  . HTN (hypertension) 12/25/2016  . Neoplasm of left kidney 04/18/2016  . Postoperative examination 03/07/2016  . Strangulated inguinal hernia 02/29/2016   Past Medical History:  Diagnosis Date  . Anxiety   . Arthritis   . Cancer [facility]) [id number]   thryoid, left kidney  . Cataracts, bilateral   . Complication of anesthesia    unable to void after surgery  . Coronary artery disease    pt denies- had stress>10 yrs ago due to"get winded walking uphill'- neg  . GERD (gastroesophageal reflux disease)   . History of hiatal hernia   . History of kidney surgery    left kidney  removed due to cancer 2017  . Hypertension   . Hypothyroidism   . Left renal mass   . Macular degeneration of both eyes    dry  . PONV (postoperative nausea and vomiting)     History reviewed. No pertinent family history.  Past Surgical History:  Procedure Laterality Date  . APPENDECTOMY    . COLONOSCOPY    . CYSTOSCOPY WITH RETROGRADE PYELOGRAM, URETEROSCOPY AND STENT PLACEMENT Left 04/01/2016   Procedure: CYSTOSCOPY WITH RETROGRADE PYELOGRAM, left ureter;  Surgeon: Raynelle Bring, MD;  Location: [facility];  Service: Urology;  Laterality: Left;  . HEMORRHOID SURGERY    . HERNIA REPAIR     inguinal-3 on left , 1 on right  . INCISIONAL HERNIA REPAIR N/A 12/23/2016   Procedure: LAPAROSCOPIC VERSUS OPEN LYSIS OF ADHESIONS AND INCISIONAL HERNIA REPAIR WITH MESH;  Surgeon: Ralene Ok, MD;  Location: [place];  Service: General;  Laterality: N/A;  . INSERTION OF MESH N/A 12/23/2016   Procedure: INSERTION OF MESH;  Surgeon: Ralene Ok, MD;  Location: [facility];  Service: General;  Laterality: N/A;  . INSERTION OF MESH N/A 01/10/2017   Procedure: INSERTION OF MESH;  Surgeon: Ralene Ok, MD;  Location: [facility];  Service: General;  Laterality: N/A;  . LAPAROSCOPIC NEPHRECTOMY Left 04/18/2016   Procedure: LAPAROSCOPIC RADICAL  LEFT NEPHRECTOMY;  Surgeon: Raynelle Bring, MD;  Location: [facility];  Service: Urology;  Laterality: Left;  . LAPAROSCOPY N/A 01/10/2017   Procedure: LAPAROSCOPY DIAGNOSTIC , POSSIBLE LAPAROTOMY;  Surgeon: Ralene Ok, MD;  Location: [facility];  Service: General;  Laterality: N/A;  . NASAL SINUS SURGERY     left side  . THYROIDECTOMY, PARTIAL     left small amount on left side-for cancer  . TONSILLECTOMY     Social History   Occupational History  . Not on file  Tobacco Use  . Smoking status: Never Smoker  . Smokeless tobacco: Never Used  Substance and Sexual Activity  . Alcohol use: No  . Drug use: No  . Sexual activity: Not on file

## 2017-10-06 NOTE — Progress Notes (Addendum)
PCP is Dr. Deland Pretty Denies ever seeing a cardiologist.  Denies chest pain, fever, or cough.  Stress test done 10 years ago. States he doesn't have CAD. Denies ever having a card cath, or echo.  Instructions given to pt to drink 2 bottles of Ensure surgery before bed the night before surgery. Instructed to drink 1 bottle before leaving his home on the morning of surgery. Teach back completed.

## 2017-10-07 NOTE — Progress Notes (Signed)
Gave instructions to drink one ensure surgery this morning and one tonight before midnight.  Drink 8oz of water the morning of surgery.  Patient verbalized understanding

## 2017-10-08 ENCOUNTER — Inpatient Hospital Stay (HOSPITAL_COMMUNITY)
Admission: RE | Admit: 2017-10-08 | Discharge: 2017-10-11 | DRG: 355 | Disposition: A | Discharge status: Home / Self Care | Payer: PPO | Source: Ambulatory Visit | Attending: General Surgery | Admitting: General Surgery

## 2017-10-08 ENCOUNTER — Encounter (HOSPITAL_COMMUNITY): Payer: Self-pay | Admitting: *Deleted

## 2017-10-08 ENCOUNTER — Encounter (HOSPITAL_COMMUNITY)
Admission: RE | Disposition: A | Discharge status: Home / Self Care | Payer: Self-pay | Source: Ambulatory Visit | Attending: General Surgery

## 2017-10-08 ENCOUNTER — Other Ambulatory Visit: Payer: Self-pay

## 2017-10-08 ENCOUNTER — Ambulatory Visit (HOSPITAL_COMMUNITY): Payer: PPO | Admitting: Anesthesiology

## 2017-10-08 DIAGNOSIS — Z85850 Personal history of malignant neoplasm of thyroid: Secondary | ICD-10-CM | POA: Diagnosis not present

## 2017-10-08 DIAGNOSIS — Z85528 Personal history of other malignant neoplasm of kidney: Secondary | ICD-10-CM | POA: Diagnosis not present

## 2017-10-08 DIAGNOSIS — K432 Incisional hernia without obstruction or gangrene: Secondary | ICD-10-CM | POA: Diagnosis present

## 2017-10-08 DIAGNOSIS — K219 Gastro-esophageal reflux disease without esophagitis: Secondary | ICD-10-CM | POA: Diagnosis present

## 2017-10-08 DIAGNOSIS — I10 Essential (primary) hypertension: Secondary | ICD-10-CM | POA: Diagnosis present

## 2017-10-08 DIAGNOSIS — N183 Chronic kidney disease, stage 3 (moderate): Secondary | ICD-10-CM | POA: Diagnosis not present

## 2017-10-08 DIAGNOSIS — Z79899 Other long term (current) drug therapy: Secondary | ICD-10-CM | POA: Diagnosis not present

## 2017-10-08 DIAGNOSIS — I129 Hypertensive chronic kidney disease with stage 1 through stage 4 chronic kidney disease, or unspecified chronic kidney disease: Secondary | ICD-10-CM | POA: Diagnosis not present

## 2017-10-08 DIAGNOSIS — Z905 Acquired absence of kidney: Secondary | ICD-10-CM | POA: Diagnosis not present

## 2017-10-08 DIAGNOSIS — E039 Hypothyroidism, unspecified: Secondary | ICD-10-CM | POA: Diagnosis present

## 2017-10-08 DIAGNOSIS — Z9889 Other specified postprocedural states: Secondary | ICD-10-CM

## 2017-10-08 DIAGNOSIS — I2510 Atherosclerotic heart disease of native coronary artery without angina pectoris: Secondary | ICD-10-CM | POA: Diagnosis present

## 2017-10-08 DIAGNOSIS — H3530 Unspecified macular degeneration: Secondary | ICD-10-CM | POA: Diagnosis present

## 2017-10-08 DIAGNOSIS — F419 Anxiety disorder, unspecified: Secondary | ICD-10-CM | POA: Diagnosis present

## 2017-10-08 DIAGNOSIS — G8918 Other acute postprocedural pain: Secondary | ICD-10-CM | POA: Diagnosis not present

## 2017-10-08 DIAGNOSIS — Z8719 Personal history of other diseases of the digestive system: Secondary | ICD-10-CM

## 2017-10-08 HISTORY — PX: INSERTION OF MESH: SHX5868

## 2017-10-08 HISTORY — PX: INCISIONAL HERNIA REPAIR: SHX193

## 2017-10-08 SURGERY — REPAIR, HERNIA, INCISIONAL
Anesthesia: Regional | Site: Abdomen

## 2017-10-08 MED ORDER — LIDOCAINE 2% (20 MG/ML) 5 ML SYRINGE
INTRAMUSCULAR | Status: AC
  Filled 2017-10-08: qty 5

## 2017-10-08 MED ORDER — METRONIDAZOLE IN NACL 5-0.79 MG/ML-% IV SOLN
500.0000 mg | Freq: Three times a day (TID) | INTRAVENOUS | Status: DC
  Administered 2017-10-08 – 2017-10-10 (×7): 500 mg via INTRAVENOUS
  Filled 2017-10-08 (×9): qty 100

## 2017-10-08 MED ORDER — BUPIVACAINE-EPINEPHRINE (PF) 0.5% -1:200000 IJ SOLN
INTRAMUSCULAR | Status: DC | PRN
  Administered 2017-10-08 (×2): 15 mL via PERINEURAL

## 2017-10-08 MED ORDER — FENTANYL CITRATE (PF) 100 MCG/2ML IJ SOLN
INTRAMUSCULAR | Status: AC
  Administered 2017-10-08: 25 ug via INTRAVENOUS
  Filled 2017-10-08: qty 2

## 2017-10-08 MED ORDER — KETAMINE HCL 10 MG/ML IJ SOLN
INTRAMUSCULAR | Status: DC | PRN
  Administered 2017-10-08: 10 mg via INTRAVENOUS
  Administered 2017-10-08: 30 mg via INTRAVENOUS
  Administered 2017-10-08: 10 mg via INTRAVENOUS

## 2017-10-08 MED ORDER — LIDOCAINE 2% (20 MG/ML) 5 ML SYRINGE
INTRAMUSCULAR | Status: DC | PRN
  Administered 2017-10-08: 40 mg via INTRAVENOUS

## 2017-10-08 MED ORDER — SUGAMMADEX SODIUM 200 MG/2ML IV SOLN
INTRAVENOUS | Status: DC | PRN
  Administered 2017-10-08: 200 mg via INTRAVENOUS

## 2017-10-08 MED ORDER — DUTASTERIDE 0.5 MG PO CAPS
0.5000 mg | ORAL_CAPSULE | Freq: Every day | ORAL | Status: DC
  Administered 2017-10-08 – 2017-10-11 (×4): 0.5 mg via ORAL
  Filled 2017-10-08 (×5): qty 1

## 2017-10-08 MED ORDER — ENOXAPARIN SODIUM 40 MG/0.4ML ~~LOC~~ SOLN
40.0000 mg | SUBCUTANEOUS | Status: DC
  Administered 2017-10-09 – 2017-10-10 (×2): 40 mg via SUBCUTANEOUS
  Filled 2017-10-08 (×2): qty 0.4

## 2017-10-08 MED ORDER — CEFAZOLIN SODIUM-DEXTROSE 2-4 GM/100ML-% IV SOLN
2.0000 g | INTRAVENOUS | Status: AC
  Administered 2017-10-08: 2 g via INTRAVENOUS

## 2017-10-08 MED ORDER — EPHEDRINE 5 MG/ML INJ
INTRAVENOUS | Status: AC
  Filled 2017-10-08: qty 10

## 2017-10-08 MED ORDER — ACETAMINOPHEN 500 MG PO TABS
ORAL_TABLET | ORAL | Status: AC
  Filled 2017-10-08: qty 2

## 2017-10-08 MED ORDER — ONDANSETRON HCL 4 MG/2ML IJ SOLN
INTRAMUSCULAR | Status: AC
  Filled 2017-10-08: qty 2

## 2017-10-08 MED ORDER — PROPOFOL 10 MG/ML IV BOLUS
INTRAVENOUS | Status: DC | PRN
  Administered 2017-10-08: 130 mg via INTRAVENOUS
  Administered 2017-10-08: 20 mg via INTRAVENOUS

## 2017-10-08 MED ORDER — 0.9 % SODIUM CHLORIDE (POUR BTL) OPTIME
TOPICAL | Status: DC | PRN
  Administered 2017-10-08: 1000 mL

## 2017-10-08 MED ORDER — SUCCINYLCHOLINE CHLORIDE 200 MG/10ML IV SOSY
PREFILLED_SYRINGE | INTRAVENOUS | Status: AC
  Filled 2017-10-08: qty 10

## 2017-10-08 MED ORDER — PROPOFOL 10 MG/ML IV BOLUS
INTRAVENOUS | Status: AC
  Filled 2017-10-08: qty 20

## 2017-10-08 MED ORDER — KETOROLAC TROMETHAMINE 15 MG/ML IJ SOLN
15.0000 mg | Freq: Four times a day (QID) | INTRAMUSCULAR | Status: DC
  Administered 2017-10-08 – 2017-10-11 (×12): 15 mg via INTRAVENOUS
  Filled 2017-10-08 (×12): qty 1

## 2017-10-08 MED ORDER — ENSURE PRE-SURGERY PO LIQD: 592.0000 mL | Freq: Once | ORAL | Status: DC

## 2017-10-08 MED ORDER — POTASSIUM CHLORIDE CRYS ER 20 MEQ PO TBCR
20.0000 meq | EXTENDED_RELEASE_TABLET | Freq: Every day | ORAL | Status: DC
  Administered 2017-10-08 – 2017-10-11 (×4): 20 meq via ORAL
  Filled 2017-10-08 (×4): qty 1

## 2017-10-08 MED ORDER — CHLORHEXIDINE GLUCONATE CLOTH 2 % EX PADS: 6.0000 | MEDICATED_PAD | Freq: Once | CUTANEOUS | Status: DC

## 2017-10-08 MED ORDER — EPHEDRINE SULFATE-NACL 50-0.9 MG/10ML-% IV SOSY
PREFILLED_SYRINGE | INTRAVENOUS | Status: DC | PRN
  Administered 2017-10-08 (×4): 10 mg via INTRAVENOUS

## 2017-10-08 MED ORDER — GABAPENTIN 300 MG PO CAPS
300.0000 mg | ORAL_CAPSULE | Freq: Two times a day (BID) | ORAL | Status: DC
  Administered 2017-10-08 – 2017-10-11 (×7): 300 mg via ORAL
  Filled 2017-10-08 (×7): qty 1

## 2017-10-08 MED ORDER — ONDANSETRON HCL 4 MG/2ML IJ SOLN
INTRAMUSCULAR | Status: DC | PRN
  Administered 2017-10-08 (×2): 4 mg via INTRAVENOUS

## 2017-10-08 MED ORDER — FENTANYL CITRATE (PF) 250 MCG/5ML IJ SOLN
INTRAMUSCULAR | Status: AC
  Filled 2017-10-08: qty 5

## 2017-10-08 MED ORDER — PAROXETINE HCL ER 25 MG PO TB24
25.0000 mg | ORAL_TABLET | Freq: Every day | ORAL | Status: DC
  Administered 2017-10-09 – 2017-10-11 (×3): 25 mg via ORAL
  Filled 2017-10-08 (×4): qty 1

## 2017-10-08 MED ORDER — ONDANSETRON HCL 4 MG/2ML IJ SOLN: 4.0000 mg | Freq: Four times a day (QID) | INTRAMUSCULAR | Status: DC | PRN

## 2017-10-08 MED ORDER — ENSURE SURGERY PO LIQD: 237.0000 mL | Freq: Two times a day (BID) | ORAL | Status: DC

## 2017-10-08 MED ORDER — ROCURONIUM BROMIDE 10 MG/ML (PF) SYRINGE
PREFILLED_SYRINGE | INTRAVENOUS | Status: AC
  Filled 2017-10-08: qty 5

## 2017-10-08 MED ORDER — PANTOPRAZOLE SODIUM 20 MG PO TBEC
20.0000 mg | DELAYED_RELEASE_TABLET | Freq: Every day | ORAL | Status: DC
  Administered 2017-10-08 – 2017-10-11 (×4): 20 mg via ORAL
  Filled 2017-10-08 (×4): qty 1

## 2017-10-08 MED ORDER — KETOROLAC TROMETHAMINE 30 MG/ML IJ SOLN
15.0000 mg | Freq: Once | INTRAMUSCULAR | Status: AC
  Administered 2017-10-08: 15 mg via INTRAVENOUS
  Filled 2017-10-08: qty 1

## 2017-10-08 MED ORDER — LACTATED RINGERS IV SOLN
INTRAVENOUS | Status: DC
  Administered 2017-10-08 (×2): via INTRAVENOUS

## 2017-10-08 MED ORDER — CHLORTHALIDONE 25 MG PO TABS
25.0000 mg | ORAL_TABLET | Freq: Every day | ORAL | Status: DC
Start: 2017-10-08 — End: 2017-10-11
  Administered 2017-10-09 – 2017-10-11 (×3): 25 mg via ORAL
  Filled 2017-10-08 (×4): qty 1

## 2017-10-08 MED ORDER — CELECOXIB 200 MG PO CAPS
ORAL_CAPSULE | ORAL | Status: AC
  Filled 2017-10-08: qty 2

## 2017-10-08 MED ORDER — ATENOLOL 50 MG PO TABS
50.0000 mg | ORAL_TABLET | Freq: Every day | ORAL | Status: DC
  Administered 2017-10-09 – 2017-10-11 (×3): 50 mg via ORAL
  Filled 2017-10-08 (×3): qty 1

## 2017-10-08 MED ORDER — ALFUZOSIN HCL ER 10 MG PO TB24
10.0000 mg | ORAL_TABLET | Freq: Every day | ORAL | Status: DC
  Administered 2017-10-08 – 2017-10-11 (×4): 10 mg via ORAL
  Filled 2017-10-08 (×4): qty 1

## 2017-10-08 MED ORDER — SUGAMMADEX SODIUM 200 MG/2ML IV SOLN
INTRAVENOUS | Status: AC
  Filled 2017-10-08: qty 2

## 2017-10-08 MED ORDER — GABAPENTIN 300 MG PO CAPS
300.0000 mg | ORAL_CAPSULE | ORAL | Status: AC
  Administered 2017-10-08: 300 mg via ORAL

## 2017-10-08 MED ORDER — PHENYLEPHRINE 40 MCG/ML (10ML) SYRINGE FOR IV PUSH (FOR BLOOD PRESSURE SUPPORT)
PREFILLED_SYRINGE | INTRAVENOUS | Status: AC
  Filled 2017-10-08: qty 10

## 2017-10-08 MED ORDER — ROCURONIUM BROMIDE 10 MG/ML (PF) SYRINGE
PREFILLED_SYRINGE | INTRAVENOUS | Status: DC | PRN
  Administered 2017-10-08: 20 mg via INTRAVENOUS
  Administered 2017-10-08: 70 mg via INTRAVENOUS

## 2017-10-08 MED ORDER — LEVOTHYROXINE SODIUM 25 MCG PO TABS
125.0000 ug | ORAL_TABLET | Freq: Every day | ORAL | Status: DC
  Administered 2017-10-09 – 2017-10-11 (×3): 125 ug via ORAL
  Filled 2017-10-08 (×3): qty 1

## 2017-10-08 MED ORDER — ENSURE PRE-SURGERY PO LIQD: 296.0000 mL | Freq: Once | ORAL | Status: DC

## 2017-10-08 MED ORDER — ROPIVACAINE HCL 7.5 MG/ML IJ SOLN
INTRAMUSCULAR | Status: DC | PRN
  Administered 2017-10-08 (×2): 10 mL via PERINEURAL

## 2017-10-08 MED ORDER — FENTANYL CITRATE (PF) 250 MCG/5ML IJ SOLN
INTRAMUSCULAR | Status: DC | PRN
  Administered 2017-10-08 (×2): 50 ug via INTRAVENOUS

## 2017-10-08 MED ORDER — CELECOXIB 200 MG PO CAPS: 400.0000 mg | ORAL_CAPSULE | ORAL | Status: DC

## 2017-10-08 MED ORDER — GABAPENTIN 300 MG PO CAPS
ORAL_CAPSULE | ORAL | Status: AC
  Filled 2017-10-08: qty 1

## 2017-10-08 MED ORDER — ATENOLOL-CHLORTHALIDONE 50-25 MG PO TABS: 1.0000 | ORAL_TABLET | Freq: Every day | ORAL | Status: DC

## 2017-10-08 MED ORDER — DEXAMETHASONE SODIUM PHOSPHATE 10 MG/ML IJ SOLN
INTRAMUSCULAR | Status: DC | PRN
  Administered 2017-10-08: 8 mg via INTRAVENOUS

## 2017-10-08 MED ORDER — PHENYLEPHRINE 40 MCG/ML (10ML) SYRINGE FOR IV PUSH (FOR BLOOD PRESSURE SUPPORT)
PREFILLED_SYRINGE | INTRAVENOUS | Status: DC | PRN
  Administered 2017-10-08: 80 ug via INTRAVENOUS

## 2017-10-08 MED ORDER — ACETAMINOPHEN 500 MG PO TABS
1000.0000 mg | ORAL_TABLET | ORAL | Status: AC
  Administered 2017-10-08: 1000 mg via ORAL

## 2017-10-08 MED ORDER — KETAMINE HCL-SODIUM CHLORIDE 100-0.9 MG/10ML-% IV SOSY
PREFILLED_SYRINGE | INTRAVENOUS | Status: AC
  Filled 2017-10-08: qty 10

## 2017-10-08 MED ORDER — FENTANYL CITRATE (PF) 100 MCG/2ML IJ SOLN
25.0000 ug | INTRAMUSCULAR | Status: DC | PRN
  Administered 2017-10-08: 25 ug via INTRAVENOUS
  Administered 2017-10-08: 50 ug via INTRAVENOUS

## 2017-10-08 MED ORDER — ONDANSETRON 4 MG PO TBDP
4.0000 mg | ORAL_TABLET | Freq: Four times a day (QID) | ORAL | Status: DC | PRN
  Administered 2017-10-10: 4 mg via ORAL
  Filled 2017-10-08: qty 1

## 2017-10-08 MED ORDER — CEFAZOLIN SODIUM-DEXTROSE 2-4 GM/100ML-% IV SOLN
INTRAVENOUS | Status: AC
  Filled 2017-10-08: qty 100

## 2017-10-08 MED ORDER — HYDROMORPHONE HCL 1 MG/ML IJ SOLN
0.5000 mg | INTRAMUSCULAR | Status: DC | PRN
  Administered 2017-10-08: 0.5 mg via INTRAVENOUS
  Filled 2017-10-08: qty 1

## 2017-10-08 MED ORDER — DEXTROSE-NACL 5-0.9 % IV SOLN
INTRAVENOUS | Status: DC
  Administered 2017-10-08 – 2017-10-10 (×4): via INTRAVENOUS

## 2017-10-08 MED ORDER — ACETAMINOPHEN 10 MG/ML IV SOLN
1000.0000 mg | Freq: Four times a day (QID) | INTRAVENOUS | Status: AC
  Administered 2017-10-08 – 2017-10-09 (×3): 1000 mg via INTRAVENOUS
  Filled 2017-10-08 (×4): qty 100

## 2017-10-08 MED ORDER — DEXTROSE 5 % IV SOLN
2.0000 g | INTRAVENOUS | Status: AC
  Administered 2017-10-08 – 2017-10-10 (×3): 2 g via INTRAVENOUS
  Filled 2017-10-08 (×4): qty 2

## 2017-10-08 SURGICAL SUPPLY — 47 items
BINDER ABDOMINAL 12 ML 46-62 (SOFTGOODS) ×2 IMPLANT
BIOPATCH RED 1 DISK 7.0 (GAUZE/BANDAGES/DRESSINGS) ×1 IMPLANT
BIOPATCH RED 1IN DISK 7.0MM (GAUZE/BANDAGES/DRESSINGS) ×1
BLADE CLIPPER SURG (BLADE) ×2 IMPLANT
COVER SURGICAL LIGHT HANDLE (MISCELLANEOUS) ×3 IMPLANT
DRAIN CHANNEL 19F RND (DRAIN) ×2 IMPLANT
DRAPE INCISE IOBAN 66X45 STRL (DRAPES) ×2 IMPLANT
DRAPE LAPAROSCOPIC ABDOMINAL (DRAPES) ×3 IMPLANT
DRSG OPSITE POSTOP 4X10 (GAUZE/BANDAGES/DRESSINGS) ×2 IMPLANT
ELECT CAUTERY BLADE 6.4 (BLADE) ×3 IMPLANT
ELECT REM PT RETURN 9FT ADLT (ELECTROSURGICAL) ×3
ELECTRODE REM PT RTRN 9FT ADLT (ELECTROSURGICAL) ×1 IMPLANT
EVACUATOR SILICONE 100CC (DRAIN) ×2 IMPLANT
GAUZE SPONGE 4X4 12PLY STRL (GAUZE/BANDAGES/DRESSINGS) IMPLANT
GLOVE BIO SURGEON STRL SZ7 (GLOVE) ×2 IMPLANT
GLOVE BIO SURGEON STRL SZ7.5 (GLOVE) ×5 IMPLANT
GLOVE BIOGEL PI IND STRL 7.0 (GLOVE) IMPLANT
GLOVE BIOGEL PI IND STRL 7.5 (GLOVE) IMPLANT
GLOVE BIOGEL PI IND STRL 8 (GLOVE) ×1 IMPLANT
GLOVE BIOGEL PI INDICATOR 7.0 (GLOVE) ×2
GLOVE BIOGEL PI INDICATOR 7.5 (GLOVE) ×2
GLOVE BIOGEL PI INDICATOR 8 (GLOVE) ×2
GOWN STRL REUS W/ TWL LRG LVL3 (GOWN DISPOSABLE) ×1 IMPLANT
GOWN STRL REUS W/ TWL XL LVL3 (GOWN DISPOSABLE) ×1 IMPLANT
GOWN STRL REUS W/TWL LRG LVL3 (GOWN DISPOSABLE) ×3
GOWN STRL REUS W/TWL XL LVL3 (GOWN DISPOSABLE) ×6
KIT BASIN OR (CUSTOM PROCEDURE TRAY) ×3 IMPLANT
KIT ROOM TURNOVER OR (KITS) ×3 IMPLANT
MARKER SKIN DUAL TIP RULER LAB (MISCELLANEOUS) ×2 IMPLANT
MESH BIOMATERIAL 20CMX25CM (Mesh General) ×2 IMPLANT
NS IRRIG 1000ML POUR BTL (IV SOLUTION) ×3 IMPLANT
PACK GENERAL/GYN (CUSTOM PROCEDURE TRAY) ×3 IMPLANT
PAD ARMBOARD 7.5X6 YLW CONV (MISCELLANEOUS) ×6 IMPLANT
STAPLER VISISTAT 35W (STAPLE) ×2 IMPLANT
SUT ETHILON 3 0 FSL (SUTURE) ×2 IMPLANT
SUT NOVA NAB GS-21 0 18 T12 DT (SUTURE) IMPLANT
SUT PDS AB 0 CT 36 (SUTURE) IMPLANT
SUT PDS AB 1 CT  36 (SUTURE) ×4
SUT PDS AB 1 CT 36 (SUTURE) IMPLANT
SUT PROLENE 1 CT (SUTURE) ×24 IMPLANT
SUT SILK 3 0 SH CR/8 (SUTURE) ×2 IMPLANT
SUT VIC AB 2-0 CT1 27 (SUTURE) ×6
SUT VIC AB 2-0 CT1 TAPERPNT 27 (SUTURE) IMPLANT
SUT VICRYL AB 2 0 TIES (SUTURE) ×1 IMPLANT
TOWEL OR 17X24 6PK STRL BLUE (TOWEL DISPOSABLE) ×3 IMPLANT
TOWEL OR 17X26 10 PK STRL BLUE (TOWEL DISPOSABLE) ×3 IMPLANT
TRAY FOLEY CATH SILVER 16FR (SET/KITS/TRAYS/PACK) ×2 IMPLANT

## 2017-10-08 NOTE — Anesthesia Procedure Notes (Signed)
Anesthesia Regional Block: TAP block   Pre-Anesthetic Checklist: ,, timeout performed, Correct Patient, Correct Site, Correct Laterality, Correct Procedure, Correct Position, risks and benefits discussed, surgical consent, pre-op evaluation,  At surgeon's request and post-op pain management  Laterality: Left  Prep: chloraprep       Needles:  Injection technique: Single-shot  Needle Type: Echogenic Needle          Additional Needles:   Narrative:  Start time: 10/08/2017 8:10 AM End time: 10/08/2017 8:16 AM Injection made incrementally with aspirations every 5 mL.  Performed by: Personally   Additional Notes: H+P and labs reviewed, risks and benefits discussed with patient, procedure tolerated well without complications

## 2017-10-08 NOTE — Anesthesia Procedure Notes (Signed)
Procedure Name: Intubation Date/Time: 10/08/2017 8:05 AM Performed by: Myna Bright, CRNA Pre-anesthesia Checklist: Patient identified, Emergency Drugs available, Suction available and Patient being monitored Patient Re-evaluated:Patient Re-evaluated prior to induction Oxygen Delivery Method: Circle system utilized Preoxygenation: Pre-oxygenation with 100% oxygen Induction Type: IV induction Ventilation: Mask ventilation without difficulty Laryngoscope Size: Mac and 4 Grade View: Grade I Tube type: Oral Tube size: 7.5 mm Number of attempts: 1 Airway Equipment and Method: Stylet Placement Confirmation: ETT inserted through vocal cords under direct vision,  positive ETCO2 and breath sounds checked- equal and bilateral Secured at: 22 cm Tube secured with: Tape Dental Injury: Teeth and Oropharynx as per pre-operative assessment

## 2017-10-08 NOTE — Anesthesia Preprocedure Evaluation (Signed)
Anesthesia Evaluation  Patient identified by MRN, date of birth, ID band Patient awake    Reviewed: Allergy & Precautions, NPO status , Patient's Chart, lab work & pertinent test results  History of Anesthesia Complications (+) PONV and history of anesthetic complications  Airway Mallampati: II  TM Distance: >3 FB Neck ROM: Full    Dental  (+) Teeth Intact   Pulmonary neg pulmonary ROS,    breath sounds clear to auscultation       Cardiovascular hypertension, Pt. on medications (-) angina+ CAD  (-) Past MI and (-) CHF  Rhythm:Regular     Neuro/Psych PSYCHIATRIC DISORDERS Anxiety negative neurological ROS     GI/Hepatic Neg liver ROS, hiatal hernia, GERD  Controlled and Medicated,  Endo/Other  neg diabetesHypothyroidism   Renal/GU Renal InsufficiencyRenal disease     Musculoskeletal  (+) Arthritis ,   Abdominal   Peds  Hematology negative hematology ROS (+)   Anesthesia Other Findings   Reproductive/Obstetrics                             Anesthesia Physical Anesthesia Plan  ASA: III  Anesthesia Plan: General and Regional   Post-op Pain Management:  Regional for Post-op pain   Induction: Intravenous  PONV Risk Score and Plan: 3 and Ondansetron, Dexamethasone and Treatment may vary due to age or medical condition  Airway Management Planned: Oral ETT  Additional Equipment: None  Intra-op Plan:   Post-operative Plan: Extubation in OR  Informed Consent: I have reviewed the patients History and Physical, chart, labs and discussed the procedure including the risks, benefits and alternatives for the proposed anesthesia with the patient or authorized representative who has indicated his/her understanding and acceptance.   Dental advisory given  Plan Discussed with: CRNA and Surgeon  Anesthesia Plan Comments:         Anesthesia Quick Evaluation

## 2017-10-08 NOTE — Op Note (Signed)
10/08/2017  10:26 AM  PATIENT:  Eric Suarez  71 y.o. male  PRE-OPERATIVE DIAGNOSIS:  Recurrent, incisional hernia  POST-OPERATIVE DIAGNOSIS:  Recurrent, incisional hernia  PROCEDURE:   OPEN BILATERAL MUSCULOCUTANEOUS FLAP CREATION,  HERNIA REPAIR, INSERTION OF MESH  SURGEON:  Surgeon(s) and Role:    Ralene Ok, MD - Primary    Donnie Mesa, MD - Assisting  ANESTHESIA:   local and general  EBL:  50 mL   BLOOD ADMINISTERED:none  DRAINS: (19Fr) Jackson-Pratt drain(s) with closed bulb suction in the SQ space   LOCAL MEDICATIONS USED:  NONE  SPECIMEN:  No Specimen  DISPOSITION OF SPECIMEN:  N/A  COUNTS:  YES  TOURNIQUET:  * No tourniquets in log *  DICTATION: .Dragon Dictation  PLAN OF CARE: Admit to inpatient   After the patient was consented he was taken back to the operating room and placed in the supine position with bilateral SCDs in place. The patient was prepped and draped in usual sterile fashion.  An Charlie Pitter was used. Antibiotics were confirmed and timeout was called and all facts verified.  A number 10 blade was used to make an upper midline incision.  I proceeded to use electrocautery to maintain hemostasis and dissection took place in the most superior portion of the wound down to the subcutaneous tissues fat to the anterior fascia. This was incised.  There were minimal adhesions to the upper midline area.   At this time the peritoneum was elevated between 2 hemostats.  This was sharply incised.  At this time two Kochers were used to elevate the fascia.  There appeared to be some omentum within the midline in the superior portion of the wound. This was carefully dissected away from the abdominal wall. Inferior to this there was some small bowel adhesions to the anterior abdominal wall.  These were taken down sharply.  The fascia was incised to the rest of incision. The hernia sacs were identified and seen.  There is no incarceration.   At this time  proceeded to create subcutaneous flaps bilaterally with cautery to maintain hemostasis.. All perforators that could be saved were kept.  I created subcutaneous flaps approximately 6-8 cm lateral of the hernia. I proceeded to dissect both superiorly and inferiorly leaving approximately 4 cm area of healthy fascia.    At this time a piece of Gore Synecore mesh measuring 20 x 25cm was placed into the abdomen. #1 Prolenes were used in interrupted fashion circumferentially, and trans-fascially to secure the mesh circumferentially. The bites were taken approximately one centimeter apart.  There was one segment to the left lower quadrant of the mesh/abdominal wall that a piece of small bowel was speared with a Prolene needle.  There appeared to be no spillage of succus.  There appeared to be a small hematoma in the small bowel wall.  3-0 silk were using a Lembert fashion to reapproximate this portion of the bowel.  Care was taken not to allow any underlying bowel to slip in between the mesh and intra-abdominal wall.  The were individually tied down.  The abdominal wall cavity was irrigated out with sterile saline. Once this was secured circumferentially the fascia was reapproximated in the midline using an #1 PDS in a standard running fashion. The midline fascia was approximated with undue tension. The subcutaneous layer was then irrigated out with sterile saline.  The umbilical stalk was reapproximated down to the anterior fascia with a 59-year-old Vicryl.  At this time 1 19  [place] Blake drain in place via stab incisions under the subcutaneous flaps.  These were secured to the anterior abdominal skin using 3-0 nylon's in interrupted fashion.  At this time the skin was reapproximated using skin staples. The patient tolerated the procedure well was taken to the recovery room with an abdominal binder in stable condition.    PATIENT DISPOSITION:  PACU - hemodynamically stable.   Delay start of Pharmacological VTE  agent (>24hrs) due to surgical blood loss or risk of bleeding: no

## 2017-10-08 NOTE — Transfer of Care (Signed)
Immediate Anesthesia Transfer of Care Note  Patient: Eric Suarez  Procedure(s) Performed: OPEN HERNIA REPAIR INCISIONAL ERAS PATHWAY (N/A Abdomen) INSERTION OF MESH (N/A Abdomen)  Patient Location: PACU  Anesthesia Type:General  Level of Consciousness: awake, alert , oriented and patient cooperative  Airway & Oxygen Therapy: Patient Spontanous Breathing and Patient connected to nasal cannula oxygen  Post-op Assessment: Report given to RN, Post -op Vital signs reviewed and stable and Patient moving all extremities  Post vital signs: Reviewed and stable  Last Vitals:  Vitals:   10/08/17 0643 10/08/17 1032  BP: 120/71 110/70  Pulse: (!) 54 72  Resp: 18 15  Temp: (!) 36.3 C 36.4 C  SpO2: 97% 96%    Last Pain:  Vitals:   10/08/17 0643  TempSrc: Oral         Complications: No apparent anesthesia complications

## 2017-10-08 NOTE — Interval H&P Note (Signed)
History and Physical Interval Note:  10/08/2017 7:15 AM  Eric Suarez  has presented today for surgery, with the diagnosis of incisional hernia  The various methods of treatment have been discussed with the patient and family. After consideration of risks, benefits and other options for treatment, the patient has consented to  Procedure(s): Tangent (N/A) INSERTION OF MESH (N/A) as a surgical intervention .  The patient's history has been reviewed, patient examined, no change in status, stable for surgery.  I have reviewed the patient's chart and labs.  Questions were answered to the patient's satisfaction.     Rosario Jacks., Anne Hahn

## 2017-10-08 NOTE — Anesthesia Procedure Notes (Signed)
Anesthesia Regional Block: TAP block   Pre-Anesthetic Checklist: ,, timeout performed, Correct Patient, Correct Site, Correct Laterality, Correct Procedure, Correct Position, risks and benefits discussed, surgical consent, pre-op evaluation,  At surgeon's request and post-op pain management  Laterality: Right  Prep: chloraprep       Needles:  Injection technique: Single-shot  Needle Type: Echogenic Needle          Additional Needles:   Narrative:  Start time: 10/08/2017 8:10 AM End time: 10/08/2017 8:16 AM Injection made incrementally with aspirations every 5 mL.  Performed by: Personally   Additional Notes: H+P and labs reviewed, risks and benefits discussed with patient, procedure tolerated well without complications

## 2017-10-09 ENCOUNTER — Encounter (HOSPITAL_COMMUNITY): Payer: Self-pay | Admitting: General Surgery

## 2017-10-09 LAB — CBC
HEMATOCRIT: 36.8 % — AB (ref 39.0–52.0)
HEMOGLOBIN: 12.1 g/dL — AB (ref 13.0–17.0)
MCH: 30 pg (ref 26.0–34.0)
MCHC: 32.9 g/dL (ref 30.0–36.0)
MCV: 91.1 fL (ref 78.0–100.0)
PLATELETS: 150 10*3/uL (ref 150–400)
RBC: 4.04 MIL/uL — AB (ref 4.22–5.81)
RDW: 14.8 % (ref 11.5–15.5)
WBC: 12.9 10*3/uL — AB (ref 4.0–10.5)

## 2017-10-09 LAB — BASIC METABOLIC PANEL
Anion gap: 7 (ref 5–15)
BUN: 28 mg/dL — AB (ref 6–20)
CHLORIDE: 105 mmol/L (ref 101–111)
CO2: 23 mmol/L (ref 22–32)
Calcium: 8 mg/dL — ABNORMAL LOW (ref 8.9–10.3)
Creatinine, Ser: 1.68 mg/dL — ABNORMAL HIGH (ref 0.61–1.24)
GFR calc Af Amer: 45 mL/min — ABNORMAL LOW (ref >60)
GFR, EST NON AFRICAN AMERICAN: 39 mL/min — AB (ref >60)
GLUCOSE: 130 mg/dL — AB (ref 65–99)
POTASSIUM: 3.5 mmol/L (ref 3.5–5.1)
Sodium: 135 mmol/L (ref 135–145)

## 2017-10-09 NOTE — Progress Notes (Addendum)
1 Day Post-Op   Subjective/Chief Complaint: Pt doing well this AM Min pain   Objective: Vital signs in last 24 hours: Temp:  [97.5 F (36.4 C)-97.8 F (36.6 C)] 97.7 F (36.5 C) (11/29 0435) Pulse Rate:  [54-72] 58 (11/29 0435) Resp:  [5-21] 16 (11/29 0435) BP: (97-113)/(49-70) 100/49 (11/29 0435) SpO2:  [94 %-99 %] 98 % (11/29 0435)    Intake/Output from previous day: 11/28 0701 - 11/29 0700 In: 3665 [I.V.:3065; IV Piggyback:600] Out: 900 [Urine:800; Drains:50; Blood:50] Intake/Output this shift: No intake/output data recorded.  General appearance: alert and cooperative Cardio: regular rate and rhythm, S1, S2 normal, no murmur, click, rub or gallop  Abd: approp ttp , ND  Lab Results:  Recent Labs    10/06/17 1439 10/09/17 0449  WBC 8.5 12.9*  HGB 14.5 12.1*  HCT 42.5 36.8*  PLT 175 150   BMET Recent Labs    10/06/17 1439 10/09/17 0449  NA 136 135  K 3.4* 3.5  CL 106 105  CO2 21* 23  GLUCOSE 76 130*  BUN 21* 28*  CREATININE 1.44* 1.68*  CALCIUM 8.8* 8.0*   Anti-infectives: Anti-infectives (From admission, onward)   Start     Dose/Rate Route Frequency Ordered Stop   10/08/17 1215  cefTRIAXone (ROCEPHIN) 2 g in dextrose 5 % 50 mL IVPB     2 g 100 mL/hr over 30 Minutes Intravenous Every 24 hours 10/08/17 1204 10/11/17 1214   10/08/17 1215  metroNIDAZOLE (FLAGYL) IVPB 500 mg     500 mg 100 mL/hr over 60 Minutes Intravenous Every 8 hours 10/08/17 1204 10/11/17 1214   10/08/17 0651  ceFAZolin (ANCEF) 2-4 GM/100ML-% IVPB    Comments:  Scronce, Trina   : cabinet override      10/08/17 0651 10/08/17 0812   10/08/17 0644  ceFAZolin (ANCEF) IVPB 2g/100 mL premix     2 g 200 mL/hr over 30 Minutes Intravenous On call to O.R. 10/08/17 0644 10/08/17 0812      Assessment/Plan: s/p Procedure(s): OPEN HERNIA REPAIR INCISIONAL ERAS PATHWAY (N/A) INSERTION OF MESH (N/A) d/c foley  Mobilize in hall Start Fulls con't non narcotic pain rx  LOS: 1 day     Rosario Jacks., Anne Hahn 10/09/2017

## 2017-10-10 ENCOUNTER — Encounter (HOSPITAL_COMMUNITY): Payer: Self-pay | Admitting: General Surgery

## 2017-10-10 LAB — CBC
HEMATOCRIT: 34.6 % — AB (ref 39.0–52.0)
Hemoglobin: 11.8 g/dL — ABNORMAL LOW (ref 13.0–17.0)
MCH: 31.1 pg (ref 26.0–34.0)
MCHC: 34.1 g/dL (ref 30.0–36.0)
MCV: 91.1 fL (ref 78.0–100.0)
PLATELETS: 121 10*3/uL — AB (ref 150–400)
RBC: 3.8 MIL/uL — AB (ref 4.22–5.81)
RDW: 15.1 % (ref 11.5–15.5)
WBC: 7.9 10*3/uL (ref 4.0–10.5)

## 2017-10-10 LAB — BASIC METABOLIC PANEL
ANION GAP: 6 (ref 5–15)
BUN: 26 mg/dL — ABNORMAL HIGH (ref 6–20)
CO2: 22 mmol/L (ref 22–32)
Calcium: 7.7 mg/dL — ABNORMAL LOW (ref 8.9–10.3)
Chloride: 105 mmol/L (ref 101–111)
Creatinine, Ser: 1.61 mg/dL — ABNORMAL HIGH (ref 0.61–1.24)
GFR, EST AFRICAN AMERICAN: 48 mL/min — AB (ref >60)
GFR, EST NON AFRICAN AMERICAN: 41 mL/min — AB (ref >60)
GLUCOSE: 119 mg/dL — AB (ref 65–99)
POTASSIUM: 3.3 mmol/L — AB (ref 3.5–5.1)
Sodium: 133 mmol/L — ABNORMAL LOW (ref 135–145)

## 2017-10-10 NOTE — Progress Notes (Signed)
2 Days Post-Op   Subjective/Chief Complaint: Pt doing well Tol PO well Mobilizing well Taking min narcotic pain Rx   Objective: Vital signs in last 24 hours: Temp:  [97.8 F (36.6 C)-98.4 F (36.9 C)] 98.2 F (36.8 C) (11/30 1302) Pulse Rate:  [64-72] 69 (11/30 1302) Resp:  [18-19] 19 (11/30 1302) BP: (112-126)/(60-65) 120/60 (11/30 1302) SpO2:  [97 %-99 %] 98 % (11/30 1302) Last BM Date: 10/10/17  Intake/Output from previous day: 11/29 0701 - 11/30 0700 In: 3318.8 [P.O.:1200; I.V.:1768.8; IV Piggyback:350] Out: 1260 [Urine:1175; Drains:85] Intake/Output this shift: Total I/O In: -  Out: 670 [Urine:650; Drains:20]  General appearance: alert and cooperative GI: soft, non-tender; bowel sounds normal; no masses,  no organomegaly and incision c/d/i  Lab Results:  Recent Labs    10/09/17 0449 10/10/17 0405  WBC 12.9* 7.9  HGB 12.1* 11.8*  HCT 36.8* 34.6*  PLT 150 121*   BMET Recent Labs    10/09/17 0449 10/10/17 0405  NA 135 133*  K 3.5 3.3*  CL 105 105  CO2 23 22  GLUCOSE 130* 119*  BUN 28* 26*  CREATININE 1.68* 1.61*  CALCIUM 8.0* 7.7*   PT/INR No results for input(s): LABPROT, INR in the last 72 hours. ABG No results for input(s): PHART, HCO3 in the last 72 hours.  Invalid input(s): PCO2, PO2  Studies/Results: No results found.  Anti-infectives: Anti-infectives (From admission, onward)   Start     Dose/Rate Route Frequency Ordered Stop   10/08/17 1215  cefTRIAXone (ROCEPHIN) 2 g in dextrose 5 % 50 mL IVPB     2 g 100 mL/hr over 30 Minutes Intravenous Every 24 hours 10/08/17 1204 10/10/17 1228   10/08/17 1215  metroNIDAZOLE (FLAGYL) IVPB 500 mg     500 mg 100 mL/hr over 60 Minutes Intravenous Every 8 hours 10/08/17 1204 10/11/17 1214   10/08/17 0651  ceFAZolin (ANCEF) 2-4 GM/100ML-% IVPB    Comments:  Scronce, Trina   : cabinet override      10/08/17 0651 10/08/17 0812   10/08/17 0644  ceFAZolin (ANCEF) IVPB 2g/100 mL premix     2 g 200  mL/hr over 30 Minutes Intravenous On call to O.R. 10/08/17 3646 10/08/17 8032      Assessment/Plan: s/p Procedure(s): OPEN HERNIA REPAIR INCISIONAL ERAS PATHWAY (N/A) INSERTION OF MESH (N/A) Advance diet to Soft Mobilize Hopefully home tomorrow.  LOS: 2 days    Rosario Jacks., [facility] 10/10/2017

## 2017-10-10 NOTE — Anesthesia Postprocedure Evaluation (Signed)
Anesthesia Post Note  Patient: Eric Suarez  Procedure(s) Performed: OPEN HERNIA REPAIR INCISIONAL ERAS PATHWAY (N/A Abdomen) INSERTION OF MESH (N/A Abdomen)     Patient location during evaluation: PACU Anesthesia Type: General and Regional Level of consciousness: awake and alert Pain management: pain level controlled Vital Signs Assessment: post-procedure vital signs reviewed and stable Respiratory status: spontaneous breathing, nonlabored ventilation, respiratory function stable and patient connected to nasal cannula oxygen Cardiovascular status: blood pressure returned to baseline and stable Postop Assessment: no apparent nausea or vomiting Anesthetic complications: no    Last Vitals:  Vitals:   10/09/17 2042 10/10/17 0442  BP: 112/65 126/64  Pulse: 64 64  Resp: 18 18  Temp: 36.9 C 36.6 C  SpO2: 97% 99%    Last Pain:  Vitals:   10/10/17 0609  TempSrc:   PainSc: 0-No pain                 Estelene Carmack

## 2017-10-11 NOTE — Progress Notes (Signed)
Patient will be discharged with a JP drain.  Reviewed JP care with patient and spouse. Both of them showed and verbalized understanding.

## 2017-10-11 NOTE — Discharge Instructions (Signed)
[facility] _______[facility] ° °HERNIA REPAIR: POST OP INSTRUCTIONS ° °Always review your discharge instruction sheet given to you by the facility where your surgery was performed. °IF YOU HAVE DISABILITY OR FAMILY LEAVE FORMS, YOU MUST BRING THEM TO THE OFFICE FOR PROCESSING.   °DO NOT GIVE THEM TO YOUR DOCTOR. ° °1. A  prescription for pain medication may be given to you upon discharge.  Take your pain medication as prescribed, if needed.  If narcotic pain medicine is not needed, then you may take acetaminophen (Tylenol) or ibuprofen (Advil) as needed. °2. Take your usually prescribed medications unless otherwise directed. °If you need a refill on your pain medication, please contact your pharmacy.  They will contact our office to request authorization. Prescriptions will not be filled after 5 pm or on week-ends. °3. You should follow a light diet the first 24 hours after arrival home, such as soup and crackers, etc.  Be sure to include lots of fluids daily.  Resume your normal diet the day after surgery. °4.Most patients will experience some swelling and bruising around the umbilicus or in the groin and scrotum.  Ice packs and reclining will help.  Swelling and bruising can take several days to resolve.  °6. It is common to experience some constipation if taking pain medication after surgery.  Increasing fluid intake and taking a stool softener (such as Colace) will usually help or prevent this problem from occurring.  A mild laxative (Milk of Magnesia or Miralax) should be taken according to package directions if there are no bowel movements after 48 hours. °7. Unless discharge instructions indicate otherwise, you may remove your bandages 24-48 hours after surgery, and you may shower at that time.  You may have steri-strips (small skin tapes) in place directly over the incision.  These strips should be left on the skin for 7-10 days.  If your surgeon used skin glue on the incision, you may shower in  24 hours.  The glue will flake off over the next 2-3 weeks.  Any sutures or staples will be removed at the office during your follow-up visit. °8. ACTIVITIES:  You may resume regular (light) daily activities beginning the next day--such as daily self-care, walking, climbing stairs--gradually increasing activities as tolerated.  You may have sexual intercourse when it is comfortable.  Refrain from any heavy lifting or straining until approved by your doctor. ° °a.You may drive when you are no longer taking prescription pain medication, you can comfortably wear a seatbelt, and you can safely maneuver your car and apply brakes. °b.RETURN TO WORK:   °_____________________________________________ ° °9.You should see your doctor in the office for a follow-up appointment approximately 2-3 weeks after your surgery.  Make sure that you call for this appointment within a day or two after you arrive home to insure a convenient appointment time. °10.OTHER INSTRUCTIONS: _________________________ °   _____________________________________ ° °WHEN TO CALL YOUR DOCTOR: °1. Fever over 101.0 °2. Inability to urinate °3. Nausea and/or vomiting °4. Extreme swelling or bruising °5. Continued bleeding from incision. °6. Increased pain, redness, or drainage from the incision ° °The clinic staff is available to answer your questions during regular business hours.  Please don’t hesitate to call and ask to speak to one of the nurses for clinical concerns.  If you have a medical emergency, go to the nearest emergency room or call 911.  A surgeon from [facility] is always on call at the hospital ° ° °[address], [address], [place], [place]  [address] ? ° [address] [address], [place], [place]   [address] °[phone] ? [phone] ? FAX [phone] °Web site: [email or web address] ° °

## 2017-10-11 NOTE — Discharge Summary (Signed)
Physician Discharge Summary  Patient ID: Eric Suarez MRN: [id number] DOB/AGE: June 22, 1946 71 y.o.  Admit date: 10/08/2017 Discharge date: 10/11/2017  Admission Diagnoses: Recurrent incisional hernia  Discharge Diagnoses:  Active Problems:   S/P hernia repair   Discharged Condition: good  Hospital Course: The patient is a 71 year old male who came in with a recurrent incisional hernia. Patient underwent surgery as above. Please see operative note for full details.  Postoperatively patient was continually rest protocol. Patient was started on a liquid diet postop day 1. He tolerated this well, had bowel function and was quickly accelerated to a soft diet. Patient was able to tolerate that well, had good pain control, with continuing while the hallway. He was otherwise afebrile, with normal WBC count.  Patient was to have JP drain education and discharged with JP drain.  Patient was deemed stable for discharge and discharged home.  Consults: None  Significant Diagnostic Studies: None  Treatments: surgery: As above  Discharge Exam: Blood pressure 135/67, pulse 62, temperature 97.8 F (36.6 C), resp. rate 18, height 6\' 3"  (1.905 m), weight 93.4 kg (206 lb), SpO2 99 %. General appearance: alert and cooperative GI: soft, non-tender; bowel sounds normal; no masses,  no organomegaly and JP drains serosanguineous  Disposition: 01-Home or Self Care  Discharge Instructions    Diet - low sodium heart healthy   Complete by:  As directed    Increase activity slowly   Complete by:  As directed      Allergies as of 10/11/2017      Reactions   Tamsulosin Nausea And Vomiting, Other (See Comments)   Uncomfortable in pelvic region   Sulfa Antibiotics Rash      Medication List    TAKE these medications   alfuzosin 10 MG 24 hr tablet Commonly known as:  UROXATRAL Take 10 mg daily by mouth.   atenolol-chlorthalidone 50-25 MG tablet Commonly known as:  TENORETIC Take 1 tablet by  mouth daily.   atorvastatin 20 MG tablet Commonly known as:  LIPITOR Take 20 mg daily by mouth.   CoQ10 100 MG Caps Take 100 mg daily by mouth.   dutasteride 0.5 MG capsule Commonly known as:  AVODART Take 0.5 mg daily by mouth.   Fish Oil 1200 MG Caps Take 2,400 mg 2 (two) times daily by mouth.   lansoprazole 15 MG capsule Commonly known as:  PREVACID Take 15 mg by mouth at bedtime.   levothyroxine 150 MCG tablet Commonly known as:  SYNTHROID Take 1 tablet (150 mcg total) by mouth daily before breakfast. What changed:  how much to take   OVER THE COUNTER MEDICATION Take 2 tablets daily by mouth. Cognium - Brain Vitamin   PARoxetine 25 MG 24 hr tablet Commonly known as:  PAXIL-CR Take 25 mg by mouth daily.   potassium chloride SA 20 MEQ tablet Commonly known as:  K-DUR,KLOR-CON Take 20 mEq by mouth daily.   PRESERVISION AREDS 2 PO Take 1 capsule 2 (two) times daily by mouth.      Follow-up Information    Ralene Ok, MD. Schedule an appointment as soon as possible for a visit in 2 day(s).   Specialty:  General Surgery Contact information: [address] [place] [address] [phone]           Signed: Rosario Jacks., Anne Hahn 10/11/2017, 6:45 AM

## 2017-10-27 ENCOUNTER — Other Ambulatory Visit: Payer: Self-pay | Admitting: Urology

## 2017-10-27 DIAGNOSIS — R972 Elevated prostate specific antigen [PSA]: Secondary | ICD-10-CM

## 2017-12-09 ENCOUNTER — Ambulatory Visit
Admission: RE | Admit: 2017-12-09 | Discharge: 2017-12-09 | Disposition: A | Discharge status: Home / Self Care | Payer: PPO | Source: Ambulatory Visit | Attending: Urology | Admitting: Urology

## 2017-12-09 DIAGNOSIS — R972 Elevated prostate specific antigen [PSA]: Secondary | ICD-10-CM | POA: Diagnosis not present

## 2017-12-09 MED ORDER — GADOBENATE DIMEGLUMINE 529 MG/ML IV SOLN
20.0000 mL | Freq: Once | INTRAVENOUS | Status: AC | PRN
  Administered 2017-12-09: 20 mL via INTRAVENOUS

## 2017-12-11 DIAGNOSIS — L309 Dermatitis, unspecified: Secondary | ICD-10-CM | POA: Diagnosis not present

## 2017-12-25 DIAGNOSIS — R972 Elevated prostate specific antigen [PSA]: Secondary | ICD-10-CM | POA: Diagnosis not present

## 2018-01-06 DIAGNOSIS — S61211A Laceration without foreign body of left index finger without damage to nail, initial encounter: Secondary | ICD-10-CM | POA: Diagnosis not present

## 2018-01-19 DIAGNOSIS — L309 Dermatitis, unspecified: Secondary | ICD-10-CM | POA: Diagnosis not present

## 2018-02-11 DIAGNOSIS — L309 Dermatitis, unspecified: Secondary | ICD-10-CM | POA: Diagnosis not present

## 2018-02-11 DIAGNOSIS — D229 Melanocytic nevi, unspecified: Secondary | ICD-10-CM | POA: Diagnosis not present

## 2018-03-03 DIAGNOSIS — E7800 Pure hypercholesterolemia, unspecified: Secondary | ICD-10-CM | POA: Diagnosis not present

## 2018-03-10 DIAGNOSIS — K219 Gastro-esophageal reflux disease without esophagitis: Secondary | ICD-10-CM | POA: Diagnosis not present

## 2018-03-10 DIAGNOSIS — E7800 Pure hypercholesterolemia, unspecified: Secondary | ICD-10-CM | POA: Diagnosis not present

## 2018-03-10 DIAGNOSIS — F419 Anxiety disorder, unspecified: Secondary | ICD-10-CM | POA: Diagnosis not present

## 2018-03-10 DIAGNOSIS — Z85528 Personal history of other malignant neoplasm of kidney: Secondary | ICD-10-CM | POA: Diagnosis not present

## 2018-03-10 DIAGNOSIS — I10 Essential (primary) hypertension: Secondary | ICD-10-CM | POA: Diagnosis not present

## 2018-03-10 DIAGNOSIS — N183 Chronic kidney disease, stage 3 (moderate): Secondary | ICD-10-CM | POA: Diagnosis not present

## 2018-03-10 DIAGNOSIS — E039 Hypothyroidism, unspecified: Secondary | ICD-10-CM | POA: Diagnosis not present

## 2018-03-12 ENCOUNTER — Other Ambulatory Visit: Payer: Self-pay | Admitting: Dermatology

## 2018-03-12 DIAGNOSIS — D225 Melanocytic nevi of trunk: Secondary | ICD-10-CM | POA: Diagnosis not present

## 2018-03-12 DIAGNOSIS — D235 Other benign neoplasm of skin of trunk: Secondary | ICD-10-CM | POA: Diagnosis not present

## 2018-03-12 DIAGNOSIS — L720 Epidermal cyst: Secondary | ICD-10-CM | POA: Diagnosis not present

## 2018-03-17 DIAGNOSIS — R972 Elevated prostate specific antigen [PSA]: Secondary | ICD-10-CM | POA: Diagnosis not present

## 2018-03-25 ENCOUNTER — Other Ambulatory Visit: Payer: Self-pay | Admitting: Urology

## 2018-03-25 ENCOUNTER — Ambulatory Visit (HOSPITAL_COMMUNITY)
Admission: RE | Admit: 2018-03-25 | Discharge: 2018-03-25 | Disposition: A | Discharge status: Home / Self Care | Payer: PPO | Source: Ambulatory Visit | Attending: Urology | Admitting: Urology

## 2018-03-25 DIAGNOSIS — Z85528 Personal history of other malignant neoplasm of kidney: Secondary | ICD-10-CM | POA: Diagnosis not present

## 2018-03-25 DIAGNOSIS — J984 Other disorders of lung: Secondary | ICD-10-CM | POA: Diagnosis not present

## 2018-03-25 DIAGNOSIS — R972 Elevated prostate specific antigen [PSA]: Secondary | ICD-10-CM | POA: Diagnosis not present

## 2018-03-25 DIAGNOSIS — N401 Enlarged prostate with lower urinary tract symptoms: Secondary | ICD-10-CM | POA: Diagnosis not present

## 2018-03-25 DIAGNOSIS — R3912 Poor urinary stream: Secondary | ICD-10-CM | POA: Diagnosis not present

## 2018-03-25 DIAGNOSIS — C649 Malignant neoplasm of unspecified kidney, except renal pelvis: Secondary | ICD-10-CM | POA: Diagnosis not present

## 2018-04-01 DIAGNOSIS — H353131 Nonexudative age-related macular degeneration, bilateral, early dry stage: Secondary | ICD-10-CM | POA: Diagnosis not present

## 2018-04-01 DIAGNOSIS — H25013 Cortical age-related cataract, bilateral: Secondary | ICD-10-CM | POA: Diagnosis not present

## 2018-04-01 DIAGNOSIS — H524 Presbyopia: Secondary | ICD-10-CM | POA: Diagnosis not present

## 2018-04-29 DIAGNOSIS — D1720 Benign lipomatous neoplasm of skin and subcutaneous tissue of unspecified limb: Secondary | ICD-10-CM | POA: Diagnosis not present

## 2018-06-24 DIAGNOSIS — R972 Elevated prostate specific antigen [PSA]: Secondary | ICD-10-CM | POA: Diagnosis not present

## 2018-09-07 DIAGNOSIS — Z125 Encounter for screening for malignant neoplasm of prostate: Secondary | ICD-10-CM | POA: Diagnosis not present

## 2018-09-07 DIAGNOSIS — E7800 Pure hypercholesterolemia, unspecified: Secondary | ICD-10-CM | POA: Diagnosis not present

## 2018-09-07 DIAGNOSIS — I10 Essential (primary) hypertension: Secondary | ICD-10-CM | POA: Diagnosis not present

## 2018-09-07 DIAGNOSIS — E039 Hypothyroidism, unspecified: Secondary | ICD-10-CM | POA: Diagnosis not present

## 2018-09-10 DIAGNOSIS — N400 Enlarged prostate without lower urinary tract symptoms: Secondary | ICD-10-CM | POA: Diagnosis not present

## 2018-09-10 DIAGNOSIS — F419 Anxiety disorder, unspecified: Secondary | ICD-10-CM | POA: Diagnosis not present

## 2018-09-10 DIAGNOSIS — K219 Gastro-esophageal reflux disease without esophagitis: Secondary | ICD-10-CM | POA: Diagnosis not present

## 2018-09-10 DIAGNOSIS — R918 Other nonspecific abnormal finding of lung field: Secondary | ICD-10-CM | POA: Diagnosis not present

## 2018-09-10 DIAGNOSIS — K449 Diaphragmatic hernia without obstruction or gangrene: Secondary | ICD-10-CM | POA: Diagnosis not present

## 2018-09-10 DIAGNOSIS — Z0000 Encounter for general adult medical examination without abnormal findings: Secondary | ICD-10-CM | POA: Diagnosis not present

## 2018-09-10 DIAGNOSIS — E032 Hypothyroidism due to medicaments and other exogenous substances: Secondary | ICD-10-CM | POA: Diagnosis not present

## 2018-09-10 DIAGNOSIS — E7800 Pure hypercholesterolemia, unspecified: Secondary | ICD-10-CM | POA: Diagnosis not present

## 2018-09-10 DIAGNOSIS — K8020 Calculus of gallbladder without cholecystitis without obstruction: Secondary | ICD-10-CM | POA: Diagnosis not present

## 2018-09-10 DIAGNOSIS — K429 Umbilical hernia without obstruction or gangrene: Secondary | ICD-10-CM | POA: Diagnosis not present

## 2018-09-10 DIAGNOSIS — N183 Chronic kidney disease, stage 3 (moderate): Secondary | ICD-10-CM | POA: Diagnosis not present

## 2018-09-10 DIAGNOSIS — I10 Essential (primary) hypertension: Secondary | ICD-10-CM | POA: Diagnosis not present

## 2018-09-18 DIAGNOSIS — R972 Elevated prostate specific antigen [PSA]: Secondary | ICD-10-CM | POA: Diagnosis not present

## 2018-09-25 DIAGNOSIS — N4341 Spermatocele of epididymis, single: Secondary | ICD-10-CM | POA: Diagnosis not present

## 2018-09-25 DIAGNOSIS — Z85528 Personal history of other malignant neoplasm of kidney: Secondary | ICD-10-CM | POA: Diagnosis not present

## 2018-09-25 DIAGNOSIS — R3912 Poor urinary stream: Secondary | ICD-10-CM | POA: Diagnosis not present

## 2018-09-25 DIAGNOSIS — N401 Enlarged prostate with lower urinary tract symptoms: Secondary | ICD-10-CM | POA: Diagnosis not present

## 2018-09-25 DIAGNOSIS — R972 Elevated prostate specific antigen [PSA]: Secondary | ICD-10-CM | POA: Diagnosis not present

## 2018-11-24 DIAGNOSIS — Z85528 Personal history of other malignant neoplasm of kidney: Secondary | ICD-10-CM | POA: Diagnosis not present

## 2018-11-24 DIAGNOSIS — N210 Calculus in bladder: Secondary | ICD-10-CM | POA: Diagnosis not present

## 2018-11-24 DIAGNOSIS — N401 Enlarged prostate with lower urinary tract symptoms: Secondary | ICD-10-CM | POA: Diagnosis not present

## 2018-11-24 DIAGNOSIS — N4341 Spermatocele of epididymis, single: Secondary | ICD-10-CM | POA: Diagnosis not present

## 2018-11-24 DIAGNOSIS — R972 Elevated prostate specific antigen [PSA]: Secondary | ICD-10-CM | POA: Diagnosis not present

## 2018-12-01 DIAGNOSIS — Z1211 Encounter for screening for malignant neoplasm of colon: Secondary | ICD-10-CM | POA: Diagnosis not present

## 2018-12-01 DIAGNOSIS — K5730 Diverticulosis of large intestine without perforation or abscess without bleeding: Secondary | ICD-10-CM | POA: Diagnosis not present

## 2018-12-01 DIAGNOSIS — D122 Benign neoplasm of ascending colon: Secondary | ICD-10-CM | POA: Diagnosis not present

## 2018-12-01 DIAGNOSIS — Z86010 Personal history of colonic polyps: Secondary | ICD-10-CM | POA: Diagnosis not present

## 2018-12-01 DIAGNOSIS — D126 Benign neoplasm of colon, unspecified: Secondary | ICD-10-CM | POA: Diagnosis not present

## 2018-12-01 DIAGNOSIS — D123 Benign neoplasm of transverse colon: Secondary | ICD-10-CM | POA: Diagnosis not present

## 2018-12-01 DIAGNOSIS — K5790 Diverticulosis of intestine, part unspecified, without perforation or abscess without bleeding: Secondary | ICD-10-CM | POA: Diagnosis not present

## 2018-12-01 DIAGNOSIS — D125 Benign neoplasm of sigmoid colon: Secondary | ICD-10-CM | POA: Diagnosis not present

## 2018-12-01 DIAGNOSIS — D124 Benign neoplasm of descending colon: Secondary | ICD-10-CM | POA: Diagnosis not present

## 2018-12-25 DIAGNOSIS — H353131 Nonexudative age-related macular degeneration, bilateral, early dry stage: Secondary | ICD-10-CM | POA: Diagnosis not present

## 2018-12-25 DIAGNOSIS — H25813 Combined forms of age-related cataract, bilateral: Secondary | ICD-10-CM | POA: Diagnosis not present

## 2019-03-19 ENCOUNTER — Other Ambulatory Visit: Payer: Self-pay

## 2019-03-19 ENCOUNTER — Other Ambulatory Visit: Payer: Self-pay | Admitting: Urology

## 2019-03-19 ENCOUNTER — Ambulatory Visit
Admission: RE | Admit: 2019-03-19 | Discharge: 2019-03-19 | Disposition: A | Discharge status: Home / Self Care | Payer: PPO | Source: Ambulatory Visit | Attending: Urology | Admitting: Urology

## 2019-03-19 DIAGNOSIS — Z85528 Personal history of other malignant neoplasm of kidney: Secondary | ICD-10-CM

## 2019-03-19 DIAGNOSIS — R972 Elevated prostate specific antigen [PSA]: Secondary | ICD-10-CM | POA: Diagnosis not present

## 2019-03-19 DIAGNOSIS — C649 Malignant neoplasm of unspecified kidney, except renal pelvis: Secondary | ICD-10-CM | POA: Diagnosis not present

## 2019-03-24 DIAGNOSIS — N401 Enlarged prostate with lower urinary tract symptoms: Secondary | ICD-10-CM | POA: Diagnosis not present

## 2019-03-24 DIAGNOSIS — Z85528 Personal history of other malignant neoplasm of kidney: Secondary | ICD-10-CM | POA: Diagnosis not present

## 2019-03-24 DIAGNOSIS — N210 Calculus in bladder: Secondary | ICD-10-CM | POA: Diagnosis not present

## 2019-03-24 DIAGNOSIS — R3912 Poor urinary stream: Secondary | ICD-10-CM | POA: Diagnosis not present

## 2019-05-20 IMAGING — XA DG MYELOGRAPHY LUMBAR INJ LUMBOSACRAL
10 series · 10 of 10 positions shown · non-contrast
Comparison: MRI 05/28/2017

CLINICAL DATA: Left low back pain. Left leg numbness and tingling,
particularly distal. Symptoms are improving.
TECHNIQUE: Contiguous axial images were obtained through the Lumbar spine after
the intrathecal infusion of infusion. Coronal and sagittal
reconstructions were obtained of the axial image sets.

[Series 1: w lumbar spine lat · 0.15mm/px · 1 of 1 slices shown]
[im 1/1]
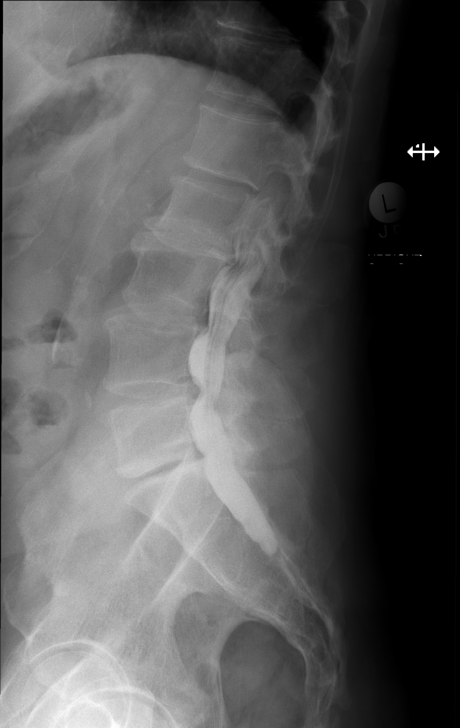

[Series 1: vasc adipose · 1 of 1 slices shown (1 of 7)]
[im 1/1]
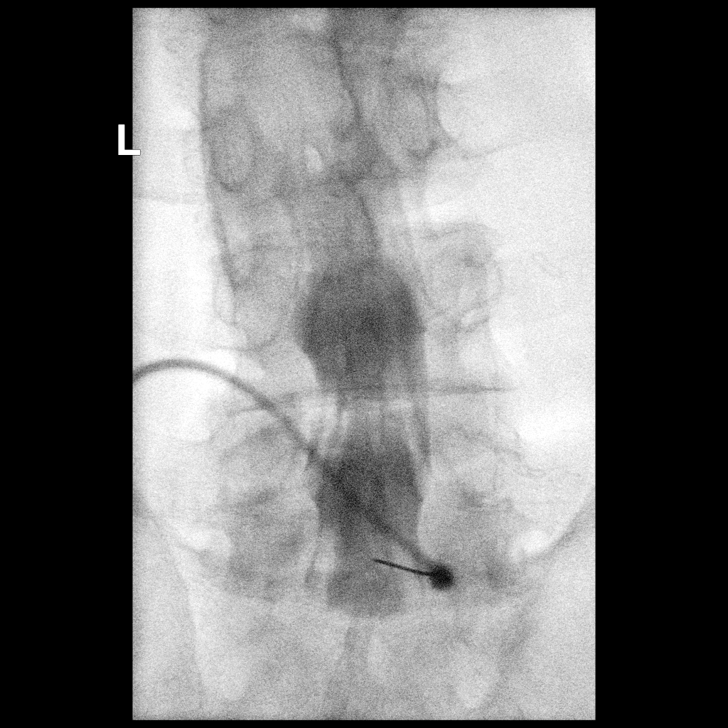

[Series 2: vasc adipose · 1 of 1 slices shown (2 of 7)]
[im 1/1]
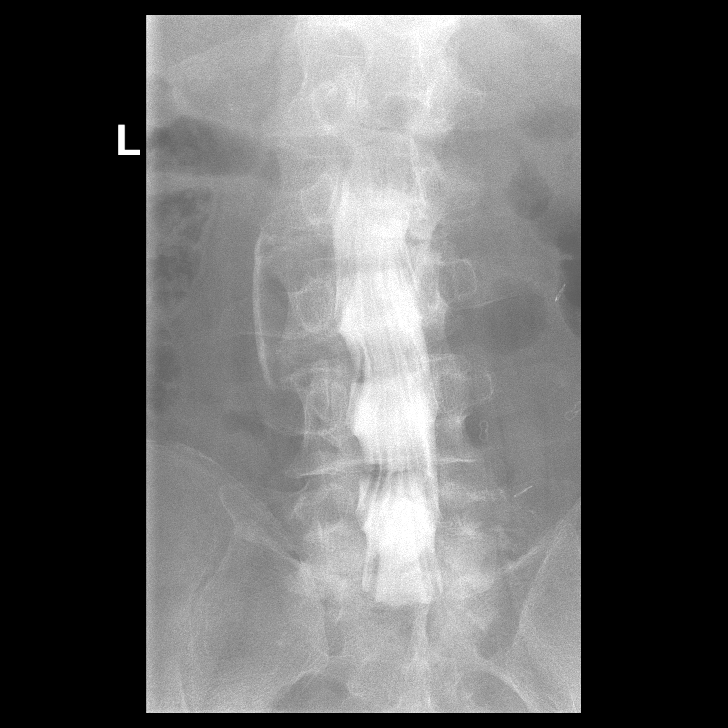

[Series 2: w lumbar spine flexion · 0.15mm/px · 1 of 1 slices shown]
[im 1/1]
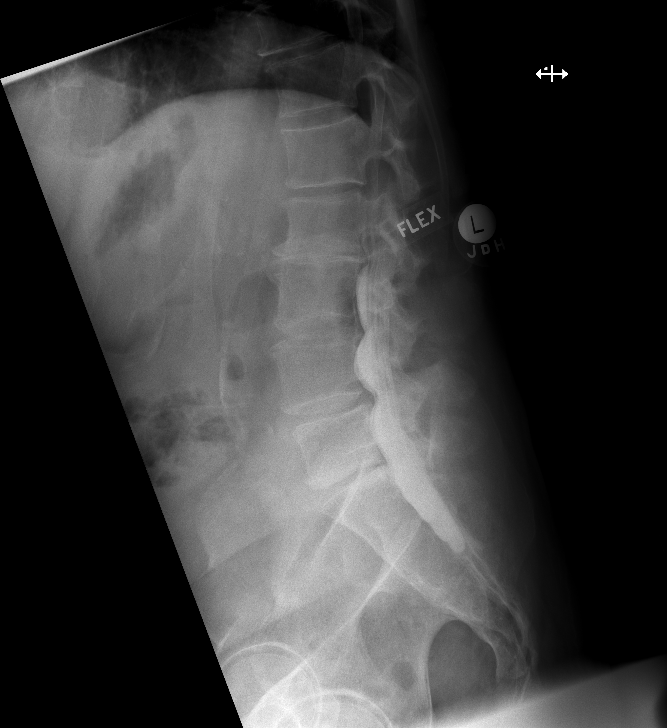

[Series 3: w lumbar spine extension · 0.15mm/px · 1 of 1 slices shown]
[im 1/1]
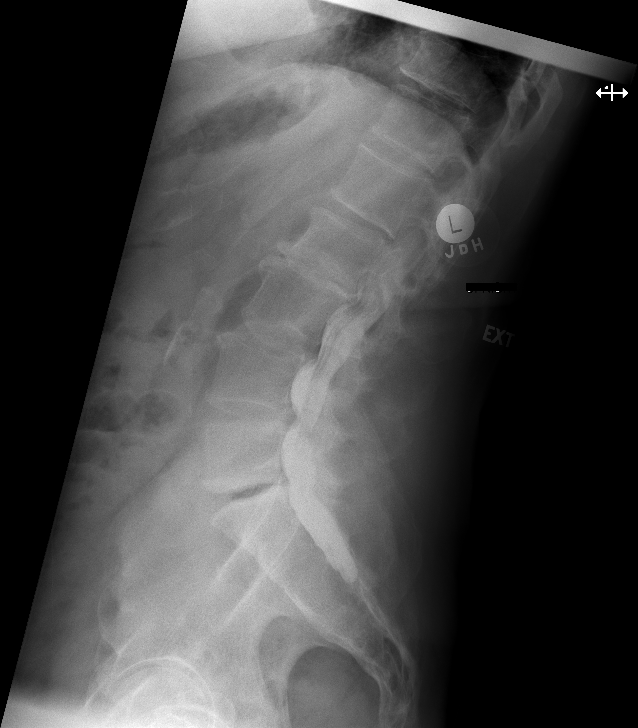

[Series 3: vasc adipose · 1 of 1 slices shown (3 of 7)]
[im 1/1]
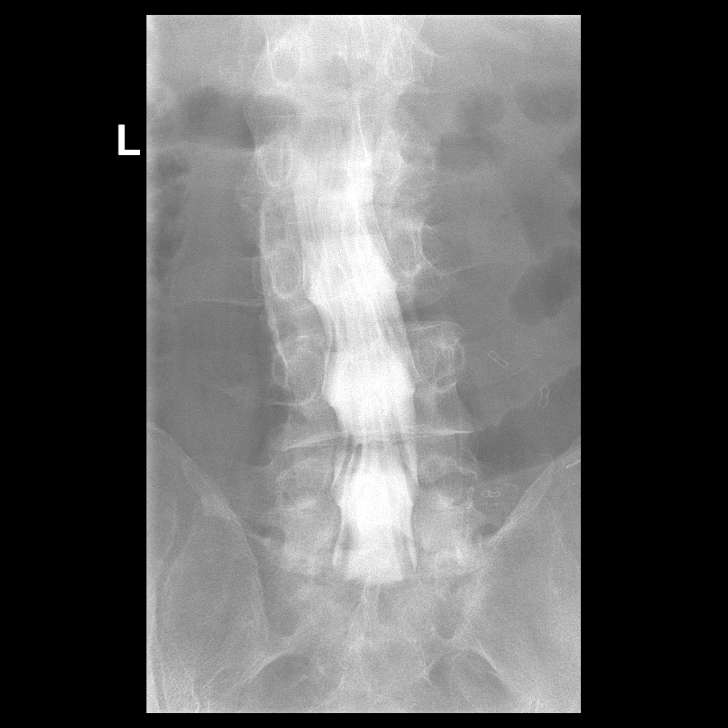

[Series 4: vasc adipose · 1 of 1 slices shown (4 of 7)]
[im 1/1]
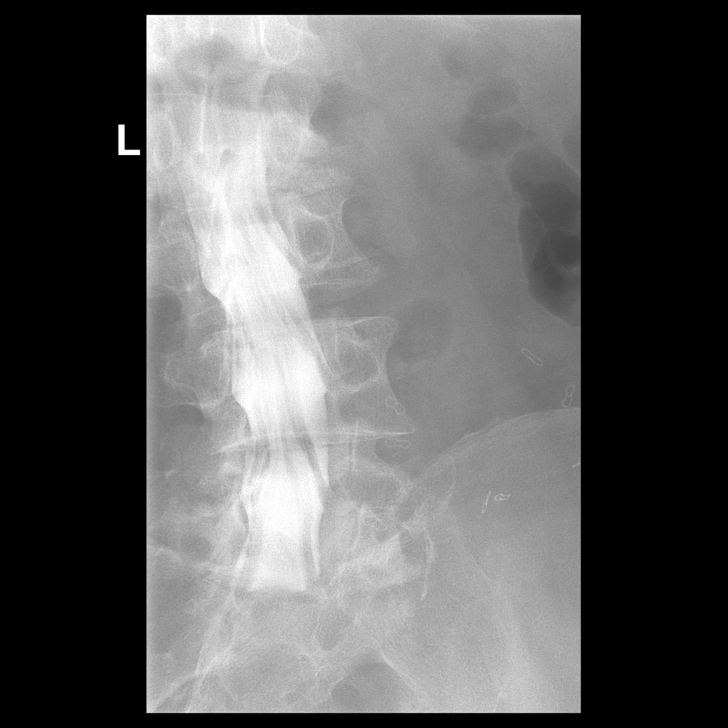

[Series 5: vasc adipose · 1 of 1 slices shown (5 of 7)]
[im 1/1]
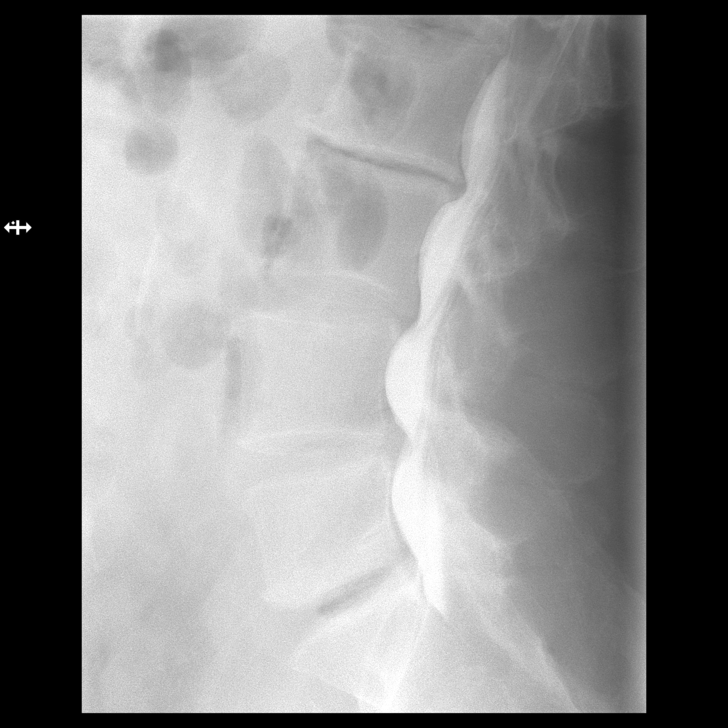

[Series 6: vasc adipose · 1 of 1 slices shown (6 of 7)]
[im 1/1]
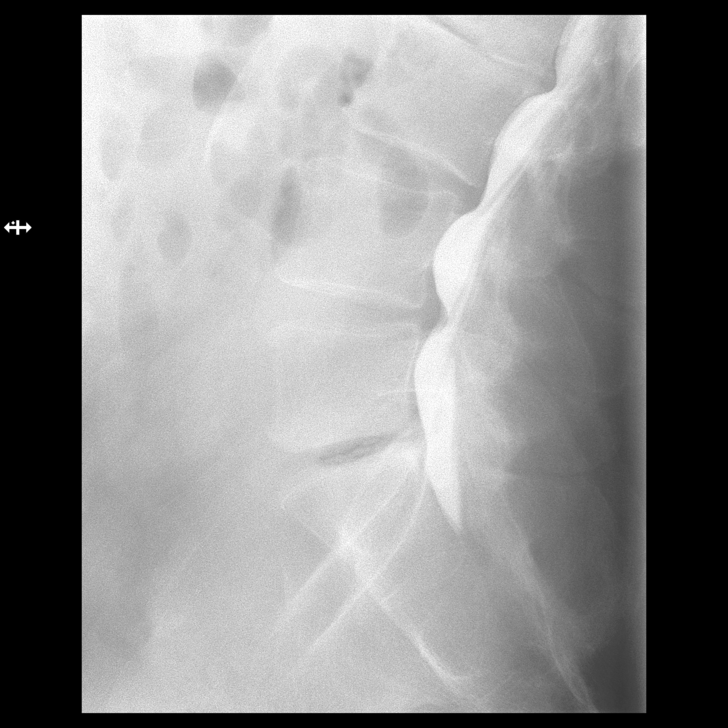

[Series 7: vasc adipose · 1 of 1 slices shown (7 of 7)]
[im 1/1]
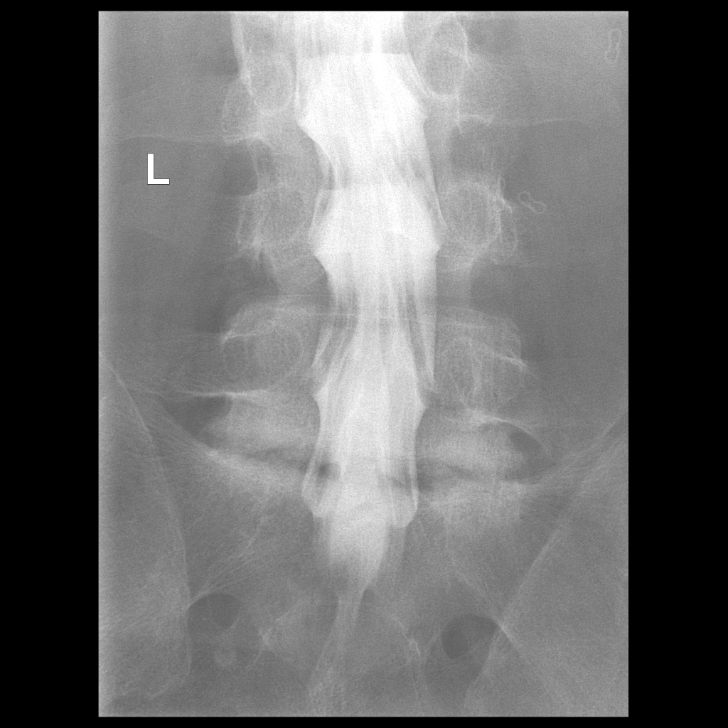

[10 of 10 positions shown; findings below may reference images not displayed]

EXAM:
LUMBAR MYELOGRAM

FLUOROSCOPY TIME:  0 minutes 25 seconds. 136.64 micro gray meter
squared

PROCEDURE:
After thorough discussion of risks and benefits of the procedure
including bleeding, infection, injury to nerves, blood vessels,
adjacent structures as well as headache and CSF leak, written and
oral informed consent was obtained. Consent was obtained by Dr. Agus
Locklear. Time out form was completed.

Patient was positioned prone on the fluoroscopy table. Local
anesthesia was provided with 1% lidocaine without epinephrine after
prepped and draped in the usual sterile fashion. Puncture was
performed at L5-S1 using a 3 1/2 inch 22-gauge spinal needle via
right para median approach. Using a single pass through the dura,
the needle was placed within the thecal sac, with return of clear
CSF. 15 mL of Isovue 200 was injected into the thecal sac, with
normal opacification of the nerve roots and cauda equina consistent
with free flow within the subarachnoid space.

I personally performed the lumbar puncture and administered the
intrathecal contrast. I also personally performed acquisition of the
myelogram images.
FINDINGS: LUMBAR MYELOGRAM FINDINGS:

Anterior extradural defects from L2-3 through L5-S1. This space
narrowing with vacuum phenomenon at L2-3 and L5-S1. Left lateral
recess narrowing at L3-4 and L4-5 that could cause neural
compression.

CT LUMBAR MYELOGRAM FINDINGS:

Curvature convex to the left with the apex at L2-3.

T12-L1:  Normal.

L1-2:  Minimal disc bulge.  No stenosis.  Conus tip at this level.

L2-3: Disc space narrowing with vacuum phenomenon. Endplate
sclerotic changes more pronounced on the right. Endplate osteophytes
and bulging of the disc more prominent on the right. Mild narrowing
of the right lateral recess and intervertebral foramen on the right
without visible neural compression.

L3-4: Shallow circumferential protrusion of the disc. Mild stenosis
of both lateral recesses without visible neural compression.

L4-5: Broad-based disc herniation more prominent in the left
posterolateral direction. Facet and ligamentous hypertrophy.
Stenosis of both lateral recesses and neural foramina left more than
right. Neural compression is possible on the left at this level
affecting both the L4 and L5 nerves.

L5-S1: Disc degeneration with loss of disc height and vacuum
phenomenon. Endplate osteophytes and shallow protrusion of the disc.
Facet osteoarthritis. No central canal stenosis. Mild bilateral
foraminal stenosis.
IMPRESSION: Curvature convex to the left with the apex at L2-3.

L2-3: Mild right lateral recess and foraminal narrowing without
definite neural compression.

L3-4: Mild lateral recess narrowing without visible neural
compression.

L4-5: Broad-based disc herniation more prominent in the left
posterolateral direction with foraminal extension. Potential for
neural compression in the left lateral recess and intervertebral
foramen on the left.

L5-S1: Mild bilateral foraminal encroachment by osteophyte and
bulging disc material.

## 2019-06-09 ENCOUNTER — Other Ambulatory Visit: Payer: Self-pay

## 2019-09-08 DIAGNOSIS — J342 Deviated nasal septum: Secondary | ICD-10-CM | POA: Diagnosis not present

## 2019-09-08 DIAGNOSIS — Z85528 Personal history of other malignant neoplasm of kidney: Secondary | ICD-10-CM | POA: Diagnosis not present

## 2019-09-08 DIAGNOSIS — Z85850 Personal history of malignant neoplasm of thyroid: Secondary | ICD-10-CM | POA: Diagnosis not present

## 2019-09-08 DIAGNOSIS — H938X2 Other specified disorders of left ear: Secondary | ICD-10-CM | POA: Diagnosis not present

## 2019-09-08 DIAGNOSIS — H9202 Otalgia, left ear: Secondary | ICD-10-CM | POA: Diagnosis not present

## 2019-09-08 DIAGNOSIS — R070 Pain in throat: Secondary | ICD-10-CM | POA: Diagnosis not present

## 2019-09-16 DIAGNOSIS — Z125 Encounter for screening for malignant neoplasm of prostate: Secondary | ICD-10-CM | POA: Diagnosis not present

## 2019-09-16 DIAGNOSIS — E7800 Pure hypercholesterolemia, unspecified: Secondary | ICD-10-CM | POA: Diagnosis not present

## 2019-09-16 DIAGNOSIS — I10 Essential (primary) hypertension: Secondary | ICD-10-CM | POA: Diagnosis not present

## 2019-09-16 DIAGNOSIS — E039 Hypothyroidism, unspecified: Secondary | ICD-10-CM | POA: Diagnosis not present

## 2019-09-20 DIAGNOSIS — N400 Enlarged prostate without lower urinary tract symptoms: Secondary | ICD-10-CM | POA: Diagnosis not present

## 2019-09-20 DIAGNOSIS — Z0000 Encounter for general adult medical examination without abnormal findings: Secondary | ICD-10-CM | POA: Diagnosis not present

## 2019-09-20 DIAGNOSIS — I10 Essential (primary) hypertension: Secondary | ICD-10-CM | POA: Diagnosis not present

## 2019-09-20 DIAGNOSIS — K219 Gastro-esophageal reflux disease without esophagitis: Secondary | ICD-10-CM | POA: Diagnosis not present

## 2019-09-20 DIAGNOSIS — F419 Anxiety disorder, unspecified: Secondary | ICD-10-CM | POA: Diagnosis not present

## 2019-09-20 DIAGNOSIS — E032 Hypothyroidism due to medicaments and other exogenous substances: Secondary | ICD-10-CM | POA: Diagnosis not present

## 2019-09-20 DIAGNOSIS — M170 Bilateral primary osteoarthritis of knee: Secondary | ICD-10-CM | POA: Diagnosis not present

## 2019-09-20 DIAGNOSIS — K432 Incisional hernia without obstruction or gangrene: Secondary | ICD-10-CM | POA: Diagnosis not present

## 2019-09-20 DIAGNOSIS — N183 Chronic kidney disease, stage 3 unspecified: Secondary | ICD-10-CM | POA: Diagnosis not present

## 2019-09-20 DIAGNOSIS — R7303 Prediabetes: Secondary | ICD-10-CM | POA: Diagnosis not present

## 2019-09-24 DIAGNOSIS — J0100 Acute maxillary sinusitis, unspecified: Secondary | ICD-10-CM | POA: Diagnosis not present

## 2019-10-04 DIAGNOSIS — J329 Chronic sinusitis, unspecified: Secondary | ICD-10-CM | POA: Diagnosis not present

## 2019-10-04 DIAGNOSIS — J0100 Acute maxillary sinusitis, unspecified: Secondary | ICD-10-CM | POA: Diagnosis not present

## 2019-10-05 ENCOUNTER — Other Ambulatory Visit: Payer: Self-pay

## 2019-10-15 NOTE — H&P (Signed)
HPI:   Eric Suarez is a 73 y.o. male who presents as a return Patient.   Current problem: Facial pain.  HPI: For the past month or so he has been experiencing tenderness and pain along the left maxilla. He denies any nasal obstruction or nasal discharge. He does experience some dental pain in that quadrant but has not been to a dentist for this although he has been on a regular basis. He has not taken anything other than some over-the-counter allergy medicine which did not help.  PMH/Meds/All/SocHx/FamHx/ROS:   Past Medical History:  Diagnosis Date  . Acute renal failure ([place])  . History of adenomatous polyp of colon  . History of kidney cancer  . Hypercholesterolemia  . Hypertension  . Lactic acidosis  . Left renal mass  . Peptic ulcer disease  . Thyroid disease   Past Surgical History:  Procedure Laterality Date  . APPENDECTOMY  . COLONOSCOPY 2005, 2011, 2014  . ESOPHAGOGASTRODUODENOSCOPY 2011, 2014  . HERNIA REPAIR  . SINUS SURGERY  . THYROIDECTOMY   No family history of bleeding disorders, wound healing problems or difficulty with anesthesia.   Social History   Socioeconomic History  . Marital status: Married  Spouse name: Not on file  . Number of children: Not on file  . Years of education: Not on file  . Highest education level: Not on file  Occupational History  . Not on file  Social Needs  . Financial resource strain: Not on file  . Food insecurity  Worry: Not on file  Inability: Not on file  . Transportation needs  Medical: Not on file  Non-medical: Not on file  Tobacco Use  . Smoking status: Never Smoker  . Smokeless tobacco: Never Used  Substance and Sexual Activity  . Alcohol use: No  . Drug use: No  . Sexual activity: Not on file  Lifestyle  . Physical activity  Days per week: Not on file  Minutes per session: Not on file  . Stress: Not on file  Relationships  . Social Medical illustrator on phone: Not on file  Gets together: Not on  file  Attends religious service: Not on file  Active member of club or organization: Not on file  Attends meetings of clubs or organizations: Not on file  Relationship status: Not on file  Other Topics Concern  . Not on file  Social History Narrative  . Not on file   Current Outpatient Medications:  . alfuzosin (UROXATRAL) 10 mg 24 hr tablet, , Disp: , Rfl:  . ANTIOX #11/OM3/DHA/EPA/LUT/ZEA (50+ ADULT EYE HEALTH ORAL), Take by mouth., Disp: , Rfl:  . atenolol-chlorthalidone (TENORETIC) 50-25 mg per tablet, TAKE 1 TABLET EVERY DAY, Disp: , Rfl: 4 . atorvastatin (LIPITOR) 20 MG tablet, Take by mouth., Disp: , Rfl:  . coenzyme Q10 (CO Q-10) 10 mg capsule, Take 10 mg by mouth daily., Disp: , Rfl:  . docusate sodium (COLACE) 100 MG capsule, TAKE 1 CAPSULE (100 MG TOTAL) BY MOUTH DAILY., Disp: , Rfl: 0 . dutasteride (AVODART) 0.5 mg capsule, Take 0.5 mg by mouth daily., Disp: , Rfl:  . KLOR-CON M20 20 mEq extended release tablet, TAKE 1 TABLET BY MOUTH DAILY, Disp: , Rfl: 99 . lansoprazole (PREVACID) 15 MG capsule, Take 15 mg by mouth daily., Disp: , Rfl:  . MULTIVIT-MINERALS/FERROUS FUM (MULTI VITAMIN ORAL), Take 1 capsule by mouth., Disp: , Rfl:  . multivitamin with minerals tablet, Take 1 capsule by mouth., Disp: , Rfl:  . omega-3-dha-epa-dpa-fish oil  1,050-1,200 mg Cap, Take 2 capsules by mouth., Disp: , Rfl:  . omeprazole (PRILOSEC) 20 MG capsule, Take 20 mg by mouth daily., Disp: , Rfl:  . oseltamivir (TAMIFLU) 30 MG capsule, , Disp: , Rfl:  . PARoxetine (PAXIL-CR) 25 MG 24 hr tablet, TAKE 1 TABLET EVERY DAY, Disp: , Rfl: 3 . SYNTHROID 125 mcg tablet, TAKE 1 TABLET EVERY DAY, Disp: , Rfl: 99   Physical Exam:   Healthy-appearing elderly gentleman in no distress. His voice and breathing are normal. There is no external facial swelling or redness. Internal nasal exam reveals right side is completely clear. Left infundibulum with some dried thick secretions. Oral cavity and pharynx are  clear and healthy. He has extensive restorative dental work. He is a little bit tender in the left upper canine fossa. There is no swelling or granulation tissue. Ears are healthy and clear. No adenopathy.  Independent Review of Additional Tests or Records:  none  Procedures:  none  Impression & Plans:  He is either suffering with acute maxillary sinusitis or with a dental infection. Recommend a 10-day course of clindamycin. This should cover either 1. Recommend he get in and see his dentist. Follow-up with me in 1 week for CT imaging if not starting to improve.

## 2019-10-19 ENCOUNTER — Other Ambulatory Visit: Payer: Self-pay

## 2019-10-19 ENCOUNTER — Encounter (HOSPITAL_BASED_OUTPATIENT_CLINIC_OR_DEPARTMENT_OTHER): Payer: Self-pay

## 2019-10-21 ENCOUNTER — Other Ambulatory Visit (HOSPITAL_COMMUNITY)
Admission: RE | Admit: 2019-10-21 | Discharge: 2019-10-21 | Disposition: A | Discharge status: Home / Self Care | Payer: PPO | Source: Ambulatory Visit | Attending: Otolaryngology | Admitting: Otolaryngology

## 2019-10-21 ENCOUNTER — Encounter (HOSPITAL_BASED_OUTPATIENT_CLINIC_OR_DEPARTMENT_OTHER)
Admission: RE | Admit: 2019-10-21 | Discharge: 2019-10-21 | Disposition: A | Discharge status: Home / Self Care | Payer: PPO | Source: Ambulatory Visit | Attending: Otolaryngology | Admitting: Otolaryngology

## 2019-10-21 ENCOUNTER — Other Ambulatory Visit: Payer: Self-pay

## 2019-10-21 DIAGNOSIS — Z20828 Contact with and (suspected) exposure to other viral communicable diseases: Secondary | ICD-10-CM | POA: Insufficient documentation

## 2019-10-21 DIAGNOSIS — Z8711 Personal history of peptic ulcer disease: Secondary | ICD-10-CM | POA: Diagnosis not present

## 2019-10-21 DIAGNOSIS — Z86010 Personal history of colonic polyps: Secondary | ICD-10-CM | POA: Diagnosis not present

## 2019-10-21 DIAGNOSIS — Z01812 Encounter for preprocedural laboratory examination: Secondary | ICD-10-CM | POA: Diagnosis not present

## 2019-10-21 DIAGNOSIS — F419 Anxiety disorder, unspecified: Secondary | ICD-10-CM | POA: Diagnosis not present

## 2019-10-21 DIAGNOSIS — I10 Essential (primary) hypertension: Secondary | ICD-10-CM | POA: Diagnosis not present

## 2019-10-21 DIAGNOSIS — Z85528 Personal history of other malignant neoplasm of kidney: Secondary | ICD-10-CM | POA: Diagnosis not present

## 2019-10-21 DIAGNOSIS — E890 Postprocedural hypothyroidism: Secondary | ICD-10-CM | POA: Diagnosis not present

## 2019-10-21 DIAGNOSIS — E7800 Pure hypercholesterolemia, unspecified: Secondary | ICD-10-CM | POA: Diagnosis not present

## 2019-10-21 DIAGNOSIS — K449 Diaphragmatic hernia without obstruction or gangrene: Secondary | ICD-10-CM | POA: Diagnosis not present

## 2019-10-21 DIAGNOSIS — K219 Gastro-esophageal reflux disease without esophagitis: Secondary | ICD-10-CM | POA: Diagnosis not present

## 2019-10-21 DIAGNOSIS — M1990 Unspecified osteoarthritis, unspecified site: Secondary | ICD-10-CM | POA: Diagnosis not present

## 2019-10-21 DIAGNOSIS — Z79899 Other long term (current) drug therapy: Secondary | ICD-10-CM | POA: Diagnosis not present

## 2019-10-21 DIAGNOSIS — J320 Chronic maxillary sinusitis: Secondary | ICD-10-CM | POA: Diagnosis not present

## 2019-10-21 LAB — BASIC METABOLIC PANEL
Anion gap: 9 (ref 5–15)
BUN: 23 mg/dL (ref 8–23)
CO2: 24 mmol/L (ref 22–32)
Calcium: 9 mg/dL (ref 8.9–10.3)
Chloride: 105 mmol/L (ref 98–111)
Creatinine, Ser: 1.52 mg/dL — ABNORMAL HIGH (ref 0.61–1.24)
GFR calc Af Amer: 52 mL/min — ABNORMAL LOW (ref >60)
GFR calc non Af Amer: 44 mL/min — ABNORMAL LOW (ref >60)
Glucose, Bld: 125 mg/dL — ABNORMAL HIGH (ref 70–99)
Potassium: 4.3 mmol/L (ref 3.5–5.1)
Sodium: 138 mmol/L (ref 135–145)

## 2019-10-22 LAB — NOVEL CORONAVIRUS, NAA (HOSP ORDER, SEND-OUT TO REF LAB; TAT 18-24 HRS): SARS-CoV-2, NAA: NOT DETECTED

## 2019-10-25 ENCOUNTER — Encounter (HOSPITAL_BASED_OUTPATIENT_CLINIC_OR_DEPARTMENT_OTHER): Payer: Self-pay | Admitting: Otolaryngology

## 2019-10-25 ENCOUNTER — Other Ambulatory Visit: Payer: Self-pay

## 2019-10-25 ENCOUNTER — Encounter (HOSPITAL_BASED_OUTPATIENT_CLINIC_OR_DEPARTMENT_OTHER)
Admission: RE | Disposition: A | Discharge status: Home / Self Care | Payer: Self-pay | Source: Home / Self Care | Attending: Otolaryngology

## 2019-10-25 ENCOUNTER — Ambulatory Visit (HOSPITAL_BASED_OUTPATIENT_CLINIC_OR_DEPARTMENT_OTHER)
Admission: RE | Admit: 2019-10-25 | Discharge: 2019-10-25 | Disposition: A | Discharge status: Home / Self Care | Payer: PPO | Attending: Otolaryngology | Admitting: Otolaryngology

## 2019-10-25 ENCOUNTER — Ambulatory Visit (HOSPITAL_BASED_OUTPATIENT_CLINIC_OR_DEPARTMENT_OTHER): Payer: PPO | Admitting: Anesthesiology

## 2019-10-25 DIAGNOSIS — N179 Acute kidney failure, unspecified: Secondary | ICD-10-CM | POA: Diagnosis not present

## 2019-10-25 DIAGNOSIS — K449 Diaphragmatic hernia without obstruction or gangrene: Secondary | ICD-10-CM | POA: Insufficient documentation

## 2019-10-25 DIAGNOSIS — J320 Chronic maxillary sinusitis: Secondary | ICD-10-CM | POA: Diagnosis not present

## 2019-10-25 DIAGNOSIS — Z85528 Personal history of other malignant neoplasm of kidney: Secondary | ICD-10-CM | POA: Insufficient documentation

## 2019-10-25 DIAGNOSIS — N183 Chronic kidney disease, stage 3 unspecified: Secondary | ICD-10-CM | POA: Diagnosis not present

## 2019-10-25 DIAGNOSIS — E7800 Pure hypercholesterolemia, unspecified: Secondary | ICD-10-CM | POA: Insufficient documentation

## 2019-10-25 DIAGNOSIS — F419 Anxiety disorder, unspecified: Secondary | ICD-10-CM | POA: Diagnosis not present

## 2019-10-25 DIAGNOSIS — Z79899 Other long term (current) drug therapy: Secondary | ICD-10-CM | POA: Insufficient documentation

## 2019-10-25 DIAGNOSIS — M1990 Unspecified osteoarthritis, unspecified site: Secondary | ICD-10-CM | POA: Insufficient documentation

## 2019-10-25 DIAGNOSIS — Z86010 Personal history of colonic polyps: Secondary | ICD-10-CM | POA: Diagnosis not present

## 2019-10-25 DIAGNOSIS — I10 Essential (primary) hypertension: Secondary | ICD-10-CM | POA: Insufficient documentation

## 2019-10-25 DIAGNOSIS — Z8711 Personal history of peptic ulcer disease: Secondary | ICD-10-CM | POA: Insufficient documentation

## 2019-10-25 DIAGNOSIS — J329 Chronic sinusitis, unspecified: Secondary | ICD-10-CM | POA: Diagnosis not present

## 2019-10-25 DIAGNOSIS — I129 Hypertensive chronic kidney disease with stage 1 through stage 4 chronic kidney disease, or unspecified chronic kidney disease: Secondary | ICD-10-CM | POA: Diagnosis not present

## 2019-10-25 DIAGNOSIS — K219 Gastro-esophageal reflux disease without esophagitis: Secondary | ICD-10-CM | POA: Insufficient documentation

## 2019-10-25 DIAGNOSIS — J0100 Acute maxillary sinusitis, unspecified: Secondary | ICD-10-CM | POA: Diagnosis not present

## 2019-10-25 DIAGNOSIS — E890 Postprocedural hypothyroidism: Secondary | ICD-10-CM | POA: Diagnosis not present

## 2019-10-25 HISTORY — PX: NASAL SINUS SURGERY: SHX719

## 2019-10-25 SURGERY — SINUS SURGERY, ENDOSCOPIC
Anesthesia: General | Site: Nose | Laterality: Left

## 2019-10-25 MED ORDER — LIDOCAINE-EPINEPHRINE 1 %-1:100000 IJ SOLN
INTRAMUSCULAR | Status: AC
  Filled 2019-10-25: qty 1

## 2019-10-25 MED ORDER — PROPOFOL 10 MG/ML IV BOLUS
INTRAVENOUS | Status: DC | PRN
  Administered 2019-10-25: 120 mg via INTRAVENOUS

## 2019-10-25 MED ORDER — EPHEDRINE SULFATE 50 MG/ML IJ SOLN
INTRAMUSCULAR | Status: DC | PRN
  Administered 2019-10-25 (×2): 10 mg via INTRAVENOUS

## 2019-10-25 MED ORDER — BACITRACIN ZINC 500 UNIT/GM EX OINT
TOPICAL_OINTMENT | CUTANEOUS | Status: AC
  Filled 2019-10-25: qty 28.35

## 2019-10-25 MED ORDER — ACETAMINOPHEN 500 MG PO TABS
1000.0000 mg | ORAL_TABLET | Freq: Once | ORAL | Status: AC
  Administered 2019-10-25: 1000 mg via ORAL

## 2019-10-25 MED ORDER — FENTANYL CITRATE (PF) 100 MCG/2ML IJ SOLN
INTRAMUSCULAR | Status: AC
  Filled 2019-10-25: qty 2

## 2019-10-25 MED ORDER — LACTATED RINGERS IV SOLN
INTRAVENOUS | Status: DC
  Administered 2019-10-25: 08:00:00 via INTRAVENOUS

## 2019-10-25 MED ORDER — ROCURONIUM BROMIDE 100 MG/10ML IV SOLN
INTRAVENOUS | Status: DC | PRN
  Administered 2019-10-25: 50 mg via INTRAVENOUS

## 2019-10-25 MED ORDER — FENTANYL CITRATE (PF) 100 MCG/2ML IJ SOLN: 25.0000 ug | INTRAMUSCULAR | Status: DC | PRN

## 2019-10-25 MED ORDER — MIDAZOLAM HCL 2 MG/2ML IJ SOLN: 1.0000 mg | INTRAMUSCULAR | Status: DC | PRN

## 2019-10-25 MED ORDER — OXYMETAZOLINE HCL 0.05 % NA SOLN
1.0000 | Freq: Two times a day (BID) | NASAL | Status: DC
  Administered 2019-10-25 (×2): 1 via NASAL

## 2019-10-25 MED ORDER — ACETAMINOPHEN 500 MG PO TABS
ORAL_TABLET | ORAL | Status: AC
  Filled 2019-10-25: qty 2

## 2019-10-25 MED ORDER — OXYMETAZOLINE HCL 0.05 % NA SOLN
NASAL | Status: AC
  Filled 2019-10-25: qty 30

## 2019-10-25 MED ORDER — LIDOCAINE HCL (CARDIAC) PF 100 MG/5ML IV SOSY
PREFILLED_SYRINGE | INTRAVENOUS | Status: DC | PRN
  Administered 2019-10-25: 80 mg via INTRAVENOUS

## 2019-10-25 MED ORDER — ONDANSETRON HCL 4 MG/2ML IJ SOLN: 4.0000 mg | Freq: Once | INTRAMUSCULAR | Status: DC | PRN

## 2019-10-25 MED ORDER — ONDANSETRON HCL 4 MG/2ML IJ SOLN
INTRAMUSCULAR | Status: DC | PRN
  Administered 2019-10-25: 4 mg via INTRAVENOUS

## 2019-10-25 MED ORDER — SUGAMMADEX SODIUM 200 MG/2ML IV SOLN
INTRAVENOUS | Status: DC | PRN
  Administered 2019-10-25: 200 mg via INTRAVENOUS

## 2019-10-25 MED ORDER — FENTANYL CITRATE (PF) 100 MCG/2ML IJ SOLN
50.0000 ug | INTRAMUSCULAR | Status: DC | PRN
  Administered 2019-10-25: 50 ug via INTRAVENOUS

## 2019-10-25 MED ORDER — OXYMETAZOLINE HCL 0.05 % NA SOLN
NASAL | Status: DC | PRN
  Administered 2019-10-25: 1

## 2019-10-25 MED ORDER — LIDOCAINE-EPINEPHRINE 1 %-1:100000 IJ SOLN
INTRAMUSCULAR | Status: DC | PRN
  Administered 2019-10-25: 1.5 mL

## 2019-10-25 SURGICAL SUPPLY — 47 items
ATTRACTOMAT 16X20 MAGNETIC DRP (DRAPES) IMPLANT
BLADE RAD40 ROTATE 4M 4 5PK (BLADE) IMPLANT
BLADE RAD40 ROTATE 4M 4MM 5PK (BLADE)
BLADE RAD60 ROTATE M4 4 5PK (BLADE) IMPLANT
BLADE RAD60 ROTATE M4 4MM 5PK (BLADE)
BLADE TRICUT ROTATE M4 4 5PK (BLADE) ×1 IMPLANT
BLADE TRICUT ROTATE M4 4MM 5PK (BLADE) ×1
BUR HS RAD FRONTAL 3 (BURR) IMPLANT
BUR HS RAD FRONTAL 3MM (BURR)
CANISTER SUC SOCK COL 7IN (MISCELLANEOUS) ×3 IMPLANT
CANISTER SUCT 1200ML W/VALVE (MISCELLANEOUS) ×6 IMPLANT
CORD BIPOLAR FORCEPS 12FT (ELECTRODE) IMPLANT
COVER WAND RF STERILE (DRAPES) IMPLANT
DECANTER SPIKE VIAL GLASS SM (MISCELLANEOUS) ×2 IMPLANT
DRESSING NASAL KENNEDY 3.5X.9 (MISCELLANEOUS) IMPLANT
DRSG CURAD 3X16 NADH (PACKING) IMPLANT
DRSG NASAL KENNEDY 3.5X.9 (MISCELLANEOUS)
DRSG NASAL KENNEDY LMNT 8CM (GAUZE/BANDAGES/DRESSINGS) IMPLANT
DRSG NASOPORE 8CM (GAUZE/BANDAGES/DRESSINGS) ×1 IMPLANT
DRSG TELFA 3X8 NADH (GAUZE/BANDAGES/DRESSINGS) IMPLANT
FORCEPS BIPOLAR SPETZLER 8 1.0 (NEUROSURGERY SUPPLIES) IMPLANT
GAUZE 4X4 16PLY RFD (DISPOSABLE) IMPLANT
GAUZE VASELINE FOILPK 1/2 X 72 (GAUZE/BANDAGES/DRESSINGS) IMPLANT
GLOVE ECLIPSE 7.5 STRL STRAW (GLOVE) ×3 IMPLANT
GOWN STRL REUS W/ TWL LRG LVL3 (GOWN DISPOSABLE) ×2 IMPLANT
GOWN STRL REUS W/TWL LRG LVL3 (GOWN DISPOSABLE) ×6
HEMOSTAT SURGICEL .5X2 ABSORB (HEMOSTASIS) IMPLANT
IV NS 500ML (IV SOLUTION) ×3
IV NS 500ML BAXH (IV SOLUTION) IMPLANT
NDL PRECISIONGLIDE 27X1.5 (NEEDLE) ×1 IMPLANT
NDL SPNL 25GX3.5 QUINCKE BL (NEEDLE) IMPLANT
NEEDLE PRECISIONGLIDE 27X1.5 (NEEDLE) ×3 IMPLANT
NEEDLE SPNL 25GX3.5 QUINCKE BL (NEEDLE) IMPLANT
NS IRRIG 1000ML POUR BTL (IV SOLUTION) ×3 IMPLANT
PACK BASIN DAY SURGERY FS (CUSTOM PROCEDURE TRAY) ×3 IMPLANT
PACK ENT DAY SURGERY (CUSTOM PROCEDURE TRAY) ×3 IMPLANT
PAD DRESSING TELFA 3X8 NADH (GAUZE/BANDAGES/DRESSINGS) IMPLANT
PATTIES SURGICAL .5 X3 (DISPOSABLE) ×3 IMPLANT
SLEEVE SCD COMPRESS KNEE MED (MISCELLANEOUS) ×3 IMPLANT
SOLUTION BUTLER CLEAR DIP (MISCELLANEOUS) ×3 IMPLANT
SPONGE GAUZE 2X2 8PLY STER LF (GAUZE/BANDAGES/DRESSINGS) ×1
SPONGE GAUZE 2X2 8PLY STRL LF (GAUZE/BANDAGES/DRESSINGS) ×2 IMPLANT
SPONGE SURGIFOAM ABS GEL 12-7 (HEMOSTASIS) IMPLANT
TOWEL GREEN STERILE FF (TOWEL DISPOSABLE) ×3 IMPLANT
TUBE CONNECTING 20'X1/4 (TUBING) ×1
TUBE CONNECTING 20X1/4 (TUBING) ×1 IMPLANT
YANKAUER SUCT BULB TIP NO VENT (SUCTIONS) ×3 IMPLANT

## 2019-10-25 NOTE — Interval H&P Note (Signed)
History and Physical Interval Note:  10/25/2019 8:07 AM  Eric Suarez  has presented today for surgery, with the diagnosis of maxillary sinusitis.  The various methods of treatment have been discussed with the patient and family. After consideration of risks, benefits and other options for treatment, the patient has consented to  Procedure(s): ENDOSCOPIC SINUS SURGERY/ethmoid and maxillary (Left) as a surgical intervention.  The patient's history has been reviewed, patient examined, no change in status, stable for surgery.  I have reviewed the patient's chart and labs.  Questions were answered to the patient's satisfaction.     Izora Gala

## 2019-10-25 NOTE — Discharge Instructions (Signed)
Start doing saline irrigations either with a Nettie pot or with a bulb syringe starting on Tuesday.  Try to do this 2-3 times daily.  Tylenol/Motrin should be adequate for pain relief.  No Tylenol until 2:00pm if needed. Call the office if you need something stronger.  Contact Dr. Junious Silk if you have difficulty urinating.   Post Anesthesia Home Care Instructions  Activity: Get plenty of rest for the remainder of the day. A responsible individual must stay with you for 24 hours following the procedure.  For the next 24 hours, DO NOT: -Drive a car -Paediatric nurse -Drink alcoholic beverages -Take any medication unless instructed by your physician -Make any legal decisions or sign important papers.  Meals: Start with liquid foods such as gelatin or soup. Progress to regular foods as tolerated. Avoid greasy, spicy, heavy foods. If nausea and/or vomiting occur, drink only clear liquids until the nausea and/or vomiting subsides. Call your physician if vomiting continues.  Special Instructions/Symptoms: Your throat may feel dry or sore from the anesthesia or the breathing tube placed in your throat during surgery. If this causes discomfort, gargle with warm salt water. The discomfort should disappear within 24 hours.  If you had a scopolamine patch placed behind your ear for the management of post- operative nausea and/or vomiting:  1. The medication in the patch is effective for 72 hours, after which it should be removed.  Wrap patch in a tissue and discard in the trash. Wash hands thoroughly with soap and water. 2. You may remove the patch earlier than 72 hours if you experience unpleasant side effects which may include dry mouth, dizziness or visual disturbances. 3. Avoid touching the patch. Wash your hands with soap and water after contact with the patch.

## 2019-10-25 NOTE — Anesthesia Preprocedure Evaluation (Addendum)
Anesthesia Evaluation  Patient identified by MRN, date of birth, ID band Patient awake    Reviewed: Allergy & Precautions, NPO status , Patient's Chart, lab work & pertinent test results  History of Anesthesia Complications (+) PONV and history of anesthetic complications (POUR)  Airway Mallampati: II  TM Distance: >3 FB Neck ROM: Full    Dental  (+) Teeth Intact, Dental Advisory Given,    Pulmonary neg pulmonary ROS,    Pulmonary exam normal breath sounds clear to auscultation       Cardiovascular hypertension, Pt. on home beta blockers and Pt. on medications (-) angina+ CAD  (-) Past MI and (-) Cardiac Stents Normal cardiovascular exam Rhythm:Regular Rate:Normal     Neuro/Psych PSYCHIATRIC DISORDERS Anxiety negative neurological ROS     GI/Hepatic Neg liver ROS, hiatal hernia, GERD  Medicated and Controlled,  Endo/Other  Hypothyroidism   Renal/GU Renal InsufficiencyRenal disease     Musculoskeletal  (+) Arthritis ,   Abdominal   Peds  Hematology negative hematology ROS (+)   Anesthesia Other Findings Day of surgery medications reviewed with the patient.  maxillary sinusitis  Reproductive/Obstetrics                            Anesthesia Physical Anesthesia Plan  ASA: III  Anesthesia Plan: General   Post-op Pain Management:    Induction: Intravenous  PONV Risk Score and Plan: 3 and Ondansetron, Dexamethasone and Treatment may vary due to age or medical condition  Airway Management Planned: Oral ETT  Additional Equipment:   Intra-op Plan:   Post-operative Plan: Extubation in OR  Informed Consent: I have reviewed the patients History and Physical, chart, labs and discussed the procedure including the risks, benefits and alternatives for the proposed anesthesia with the patient or authorized representative who has indicated his/her understanding and acceptance.     Dental  advisory given  Plan Discussed with: CRNA  Anesthesia Plan Comments:         Anesthesia Quick Evaluation

## 2019-10-25 NOTE — Op Note (Signed)
OPERATIVE REPORT  DATE OF SURGERY: 10/25/2019  PATIENT:  Eric Suarez,  73 y.o. male  PRE-OPERATIVE DIAGNOSIS:  maxillary sinusitis  POST-OPERATIVE DIAGNOSIS:  maxillary sinusitis  PROCEDURE:  Procedure(s): ENDOSCOPIC SINUS SURGERY/ maxillary  SURGEON:  Beckie Salts, MD  ASSISTANTS: None  ANESTHESIA:   General   EBL: 20 ml  DRAINS: None  LOCAL MEDICATIONS USED: 1% Xylocaine with epinephrine  SPECIMEN: Left maxillary sinus contents  COUNTS:  Correct  PROCEDURE DETAILS: The patient was taken to the operating room and placed on the operating table in the supine position. Following induction of general endotracheal anesthesia, the face was draped in standard fashion.  Oxymetazoline spray was used preoperatively nasal cavities.  The 0 degree endoscope was used to evaluate the left nasal cavity a large fungus ball identified in the nasal cavity arising from the infundibulum.  This was cleaned out using forceps.  Local anesthetic was infiltrated into the lateral nasal wall and the middle turbinate remnant.  There was no antrostomy identified.  A new antrostomy was created in the soft tissues remaining along the infundibulum.  The maxillary sinus was entered and a large amount of thick brown purulent material was suctioned out.  Antrostomy was enlarged using Tru-Cut forceps posteriorly and backbiting forceps anteriorly.  The maxillary sinus was irrigated with a curved suction and a syringe with saline.  Additional fungal material was obtained and cleaned out.  The antrostomy was widened the opened using the microdebrider.  There is no evidence of ethmoid sinus disease so the ethmoid portion of the procedure was not performed.  Afrin pledgets were placed in the antrostomy site and were removed just before extubation.  Patient was awakened extubated and transferred to recovery in stable condition.    PATIENT DISPOSITION:  To PACU, stable

## 2019-10-25 NOTE — Transfer of Care (Signed)
Immediate Anesthesia Transfer of Care Note  Patient: Eric Suarez  Procedure(s) Performed: ENDOSCOPIC SINUS SURGERY/ethmoid and maxillary (Left Nose)  Patient Location: PACU  Anesthesia Type:General  Level of Consciousness: sedated  Airway & Oxygen Therapy: Patient Spontanous Breathing and Patient connected to face mask oxygen  Post-op Assessment: Report given to RN and Post -op Vital signs reviewed and stable  Post vital signs: Reviewed and stable  Last Vitals:  Vitals Value Taken Time  BP 124/73 10/25/19 0930  Temp    Pulse 61 10/25/19 0931  Resp 15 10/25/19 0931  SpO2 100 % 10/25/19 0931  Vitals shown include unvalidated device data.  Last Pain:  Vitals:   10/25/19 0930  TempSrc:   PainSc: (P) 0-No pain      Patients Stated Pain Goal: 3 [id number] [phone])  Complications: No apparent anesthesia complications

## 2019-10-25 NOTE — Anesthesia Procedure Notes (Signed)
Procedure Name: Intubation Date/Time: 10/25/2019 8:47 AM Performed by: Maryella Shivers, CRNA Pre-anesthesia Checklist: Patient identified, Emergency Drugs available, Suction available and Patient being monitored Patient Re-evaluated:Patient Re-evaluated prior to induction Oxygen Delivery Method: Circle system utilized Preoxygenation: Pre-oxygenation with 100% oxygen Induction Type: IV induction Ventilation: Mask ventilation without difficulty Laryngoscope Size: Mac and 4 Grade View: Grade I Tube type: Oral Tube size: 8.0 mm Number of attempts: 1 Airway Equipment and Method: Stylet and Oral airway Placement Confirmation: ETT inserted through vocal cords under direct vision,  positive ETCO2 and breath sounds checked- equal and bilateral Secured at: 22 cm Tube secured with: Tape Dental Injury: Teeth and Oropharynx as per pre-operative assessment

## 2019-10-25 NOTE — Anesthesia Postprocedure Evaluation (Signed)
Anesthesia Post Note  Patient: THELBERT HOLSTEN  Procedure(s) Performed: ENDOSCOPIC SINUS SURGERY/ethmoid and maxillary (Left Nose)     Patient location during evaluation: PACU Anesthesia Type: General Level of consciousness: awake and alert Pain management: pain level controlled Vital Signs Assessment: post-procedure vital signs reviewed and stable Respiratory status: spontaneous breathing, nonlabored ventilation and respiratory function stable Cardiovascular status: blood pressure returned to baseline and stable Postop Assessment: no apparent nausea or vomiting Anesthetic complications: no    Last Vitals:  Vitals:   10/25/19 0945 10/25/19 1000  BP: 124/72 126/77  Pulse: 61 72  Resp: 15 16  Temp:  36.8 C  SpO2: 100% 98%    Last Pain:  Vitals:   10/25/19 1000  TempSrc:   PainSc: 2                  Catalina Gravel

## 2019-10-26 ENCOUNTER — Encounter: Payer: Self-pay | Admitting: *Deleted

## 2019-10-27 ENCOUNTER — Other Ambulatory Visit: Payer: Self-pay

## 2019-10-27 LAB — SURGICAL PATHOLOGY

## 2019-12-29 DIAGNOSIS — H04129 Dry eye syndrome of unspecified lacrimal gland: Secondary | ICD-10-CM | POA: Diagnosis not present

## 2019-12-29 DIAGNOSIS — H25813 Combined forms of age-related cataract, bilateral: Secondary | ICD-10-CM | POA: Diagnosis not present

## 2019-12-29 DIAGNOSIS — H353131 Nonexudative age-related macular degeneration, bilateral, early dry stage: Secondary | ICD-10-CM | POA: Diagnosis not present

## 2020-01-10 DIAGNOSIS — M7918 Myalgia, other site: Secondary | ICD-10-CM | POA: Diagnosis not present

## 2020-01-10 DIAGNOSIS — K219 Gastro-esophageal reflux disease without esophagitis: Secondary | ICD-10-CM | POA: Diagnosis not present

## 2020-01-18 DIAGNOSIS — I10 Essential (primary) hypertension: Secondary | ICD-10-CM | POA: Diagnosis not present

## 2020-01-18 DIAGNOSIS — E7800 Pure hypercholesterolemia, unspecified: Secondary | ICD-10-CM | POA: Diagnosis not present

## 2020-01-18 DIAGNOSIS — R7301 Impaired fasting glucose: Secondary | ICD-10-CM | POA: Diagnosis not present

## 2020-01-18 DIAGNOSIS — E663 Overweight: Secondary | ICD-10-CM | POA: Diagnosis not present

## 2020-01-18 DIAGNOSIS — Z85850 Personal history of malignant neoplasm of thyroid: Secondary | ICD-10-CM | POA: Diagnosis not present

## 2020-01-20 DIAGNOSIS — S61412A Laceration without foreign body of left hand, initial encounter: Secondary | ICD-10-CM | POA: Diagnosis not present

## 2020-01-25 ENCOUNTER — Ambulatory Visit (INDEPENDENT_AMBULATORY_CARE_PROVIDER_SITE_OTHER): Payer: PPO

## 2020-01-25 ENCOUNTER — Other Ambulatory Visit: Payer: Self-pay

## 2020-01-25 ENCOUNTER — Ambulatory Visit (INDEPENDENT_AMBULATORY_CARE_PROVIDER_SITE_OTHER): Payer: PPO | Admitting: Family Medicine

## 2020-01-25 ENCOUNTER — Encounter: Payer: Self-pay | Admitting: Family Medicine

## 2020-01-25 VITALS — BP 112/80 | HR 57 | Ht 75.0 in | Wt 204.0 lb

## 2020-01-25 DIAGNOSIS — M25551 Pain in right hip: Secondary | ICD-10-CM

## 2020-01-25 DIAGNOSIS — R29898 Other symptoms and signs involving the musculoskeletal system: Secondary | ICD-10-CM

## 2020-01-25 NOTE — Patient Instructions (Addendum)
Thank you for coming in today. We will plan for home exercises and PT.  Recheck with me in 1 month.  Return sooner if needed.  Next step is not better is injection.   Please perform the exercise program that we have prepared for you and gone over in detail on a daily basis.  In addition to the handout you were provided you can access your program through: [email or web address]   Your unique program code is: [email or web address]

## 2020-01-25 NOTE — Progress Notes (Signed)
Subjective:    I'm seeing this patient as a consultation for:  Dr. Shelia Media. Note will be routed back to referring provider/PCP.  CC: R-sided low back pain, R hip and groin pain  I, Judy Pimple, am serving as a scribe for Dr. Lynne Leader.  HPI: Pt is a 74 y/o male presenting w/ c/o chronic low back pain, R hip/buttock and R groin pain that recently worsened over the last 2 months that he feels is attributed to some outside work he was doing at that time.  He has a hx of a R inguinal hernia repair.  He locates his pain to right hip and groin area .  He rates his pain at a 2/10 at rest and a 6-7/10 at it's worst and describes his pain as dull and throbbing.    Radiating pain: starts in R lower back and wraps around R hip to R groin R LE numbness/tingling:No R LE weakness: no Aggravating factors: Walking Treatments tried: Tylenol  Diagnostic imaging: L-spine CT- 07/28/17, CT scan abdomen pelvis January 10, 2017  Past medical history, Surgical history, Family history, Social history, Allergies, and medications have been entered into the medical record, reviewed.   Review of Systems: No new headache, visual changes, nausea, vomiting, diarrhea, constipation, dizziness, abdominal pain, skin rash, fevers, chills, night sweats, weight loss, swollen lymph nodes, body aches, joint swelling, muscle aches, chest pain, shortness of breath, mood changes, visual or auditory hallucinations.   Objective:    Vitals:   01/25/20 1016  BP: 112/80  Pulse: (!) 57  SpO2: 99%   General: Well Developed, well nourished, and in no acute distress.   MSK:  L-spine: Nontender to spinal midline. Nontender SI joint bilaterally.  Right hip: Normal-appearing Range of motion limited internal rotation with mild pain.  Normal flexion.  Normal external rotation. Mildly tender to palpation greater trochanter. Strength: Hip flexion strength slightly diminished 4/5 with pain.  Abduction strength 4/5 with minimal pain.   External rotation strength 4/5 with pain.  Internal rotation and adduction strength 5/5 without pain.  Left hip normal motion normal strength.  Nontender.  Lab and Radiology Results  X-ray images right hip obtained today personally and independently reviewed Moderate right hip DJD no acute fractures. Await formal radiology review  Independently reviewed images from CT scan abdomen pelvis March 2018 and L-spine September 2018. Patient did have mild hip DJD on CT scan March 2018.  He has significant degenerative changes L-spine along with scoliosis L-spine CT scan 2018.  Impression and Recommendations:    Assessment and Plan: 73 y.o. male with  Right hip pain: Multifactorial very likely.  Anterior hip pain most likely due to interarticular cause such as impingement or DJD.  X-ray today shows moderate DJD however radiology overread is still pending.  His exam is significant for weakness of the hip flexors abductors and external rotators.  He may benefit from strengthening and stretching of these muscles and tendons.  Plan for home exercise program as taught in clinic today by ATC.  Also refer to physical therapy.  Check back in about a month.  If not sufficiently controlled next step possible interarticular diagnostic and therapeutic injection.  May also proceed with advanced imaging if needed. Differential does include lumbar radiculopathy at L1 or L2 and will pursue work-up for this as well in the future if needed.  [id number]; 15 additional minutes spent for Therapeutic exercises as stated in above notes.  This included exercises focusing on stretching, strengthening, with significant  focus on eccentric aspects.   Long term goals include an improvement in range of motion, strength, endurance as well as avoiding reinjury. Patient's frequency would include in 1-2 times a day, 3-5 times a week for a duration of 6-12 weeks.  Proper technique shown and discussed handout in great detail with ATC.  All  questions were discussed and answered.    Will send note to PCP Dr [facility] [address] [place], [place] [address] Phone: [phone] Fax: [phone]   Orders Placed This Encounter  Procedures  . DG HIP UNILAT WITH PELVIS 2-3 VIEWS RIGHT    Standing Status:   Future    Standing Expiration Date:   03/26/2021    Order Specific Question:   Reason for Exam (SYMPTOM  OR DIAGNOSIS REQUIRED)    Answer:   eval right hip pain    Order Specific Question:   Preferred imaging location?    Answer:   [facility]    Order Specific Question:   Radiology Contrast Protocol - do NOT remove file path    Answer:   [email or web address]  . Ambulatory referral to Physical Therapy    Referral Priority:   Routine    Referral Type:   Physical Medicine    Referral Reason:   Specialty Services Required    Requested Specialty:   Physical Therapy    Number of Visits Requested:   1   No orders of the defined types were placed in this encounter.   Discussed warning signs or symptoms. Please see discharge instructions. Patient expresses understanding.   The above documentation has been reviewed and is accurate and complete Lynne Leader

## 2020-01-26 NOTE — Progress Notes (Signed)
X-ray images hip showed no acute fractures.

## 2020-01-31 DIAGNOSIS — M545 Low back pain: Secondary | ICD-10-CM | POA: Diagnosis not present

## 2020-01-31 DIAGNOSIS — M5416 Radiculopathy, lumbar region: Secondary | ICD-10-CM | POA: Diagnosis not present

## 2020-01-31 DIAGNOSIS — M25651 Stiffness of right hip, not elsewhere classified: Secondary | ICD-10-CM | POA: Diagnosis not present

## 2020-01-31 DIAGNOSIS — M25551 Pain in right hip: Secondary | ICD-10-CM | POA: Diagnosis not present

## 2020-02-03 DIAGNOSIS — M25651 Stiffness of right hip, not elsewhere classified: Secondary | ICD-10-CM | POA: Diagnosis not present

## 2020-02-03 DIAGNOSIS — M25551 Pain in right hip: Secondary | ICD-10-CM | POA: Diagnosis not present

## 2020-02-03 DIAGNOSIS — M545 Low back pain: Secondary | ICD-10-CM | POA: Diagnosis not present

## 2020-02-03 DIAGNOSIS — M5416 Radiculopathy, lumbar region: Secondary | ICD-10-CM | POA: Diagnosis not present

## 2020-02-04 ENCOUNTER — Encounter: Payer: Self-pay | Admitting: Family Medicine

## 2020-02-04 NOTE — Progress Notes (Signed)
Received PT notes from physical therapy and hand specialists.  Note will be sent to scan.

## 2020-02-07 DIAGNOSIS — M25551 Pain in right hip: Secondary | ICD-10-CM | POA: Diagnosis not present

## 2020-02-07 DIAGNOSIS — M545 Low back pain: Secondary | ICD-10-CM | POA: Diagnosis not present

## 2020-02-07 DIAGNOSIS — M5416 Radiculopathy, lumbar region: Secondary | ICD-10-CM | POA: Diagnosis not present

## 2020-02-07 DIAGNOSIS — M25651 Stiffness of right hip, not elsewhere classified: Secondary | ICD-10-CM | POA: Diagnosis not present

## 2020-02-10 DIAGNOSIS — M545 Low back pain: Secondary | ICD-10-CM | POA: Diagnosis not present

## 2020-02-10 DIAGNOSIS — M25651 Stiffness of right hip, not elsewhere classified: Secondary | ICD-10-CM | POA: Diagnosis not present

## 2020-02-10 DIAGNOSIS — M25551 Pain in right hip: Secondary | ICD-10-CM | POA: Diagnosis not present

## 2020-02-10 DIAGNOSIS — M5416 Radiculopathy, lumbar region: Secondary | ICD-10-CM | POA: Diagnosis not present

## 2020-02-14 DIAGNOSIS — M25551 Pain in right hip: Secondary | ICD-10-CM | POA: Diagnosis not present

## 2020-02-14 DIAGNOSIS — M25651 Stiffness of right hip, not elsewhere classified: Secondary | ICD-10-CM | POA: Diagnosis not present

## 2020-02-14 DIAGNOSIS — M5416 Radiculopathy, lumbar region: Secondary | ICD-10-CM | POA: Diagnosis not present

## 2020-02-14 DIAGNOSIS — M545 Low back pain: Secondary | ICD-10-CM | POA: Diagnosis not present

## 2020-02-17 DIAGNOSIS — M25551 Pain in right hip: Secondary | ICD-10-CM | POA: Diagnosis not present

## 2020-02-17 DIAGNOSIS — M25651 Stiffness of right hip, not elsewhere classified: Secondary | ICD-10-CM | POA: Diagnosis not present

## 2020-02-17 DIAGNOSIS — M5416 Radiculopathy, lumbar region: Secondary | ICD-10-CM | POA: Diagnosis not present

## 2020-02-17 DIAGNOSIS — M545 Low back pain: Secondary | ICD-10-CM | POA: Diagnosis not present

## 2020-02-21 DIAGNOSIS — M25651 Stiffness of right hip, not elsewhere classified: Secondary | ICD-10-CM | POA: Diagnosis not present

## 2020-02-21 DIAGNOSIS — M25551 Pain in right hip: Secondary | ICD-10-CM | POA: Diagnosis not present

## 2020-02-21 DIAGNOSIS — M5416 Radiculopathy, lumbar region: Secondary | ICD-10-CM | POA: Diagnosis not present

## 2020-02-21 DIAGNOSIS — M545 Low back pain: Secondary | ICD-10-CM | POA: Diagnosis not present

## 2020-02-23 DIAGNOSIS — M545 Low back pain: Secondary | ICD-10-CM | POA: Diagnosis not present

## 2020-02-23 DIAGNOSIS — M25651 Stiffness of right hip, not elsewhere classified: Secondary | ICD-10-CM | POA: Diagnosis not present

## 2020-02-23 DIAGNOSIS — M25551 Pain in right hip: Secondary | ICD-10-CM | POA: Diagnosis not present

## 2020-02-23 DIAGNOSIS — M5416 Radiculopathy, lumbar region: Secondary | ICD-10-CM | POA: Diagnosis not present

## 2020-02-29 DIAGNOSIS — M25551 Pain in right hip: Secondary | ICD-10-CM | POA: Diagnosis not present

## 2020-02-29 DIAGNOSIS — M5416 Radiculopathy, lumbar region: Secondary | ICD-10-CM | POA: Diagnosis not present

## 2020-02-29 DIAGNOSIS — M545 Low back pain: Secondary | ICD-10-CM | POA: Diagnosis not present

## 2020-02-29 DIAGNOSIS — M25651 Stiffness of right hip, not elsewhere classified: Secondary | ICD-10-CM | POA: Diagnosis not present

## 2020-03-02 DIAGNOSIS — M25551 Pain in right hip: Secondary | ICD-10-CM | POA: Diagnosis not present

## 2020-03-02 DIAGNOSIS — M25651 Stiffness of right hip, not elsewhere classified: Secondary | ICD-10-CM | POA: Diagnosis not present

## 2020-03-02 DIAGNOSIS — M545 Low back pain: Secondary | ICD-10-CM | POA: Diagnosis not present

## 2020-03-02 DIAGNOSIS — M5416 Radiculopathy, lumbar region: Secondary | ICD-10-CM | POA: Diagnosis not present

## 2020-03-06 DIAGNOSIS — M5416 Radiculopathy, lumbar region: Secondary | ICD-10-CM | POA: Diagnosis not present

## 2020-03-06 DIAGNOSIS — M25551 Pain in right hip: Secondary | ICD-10-CM | POA: Diagnosis not present

## 2020-03-06 DIAGNOSIS — M25651 Stiffness of right hip, not elsewhere classified: Secondary | ICD-10-CM | POA: Diagnosis not present

## 2020-03-06 DIAGNOSIS — M545 Low back pain: Secondary | ICD-10-CM | POA: Diagnosis not present

## 2020-03-07 ENCOUNTER — Encounter: Payer: Self-pay | Admitting: Family Medicine

## 2020-03-07 NOTE — Progress Notes (Signed)
Received notes from physical therapy and hand specialists in [facility]. As of April 22 patient had completed 10 physical therapy visits and had good improvements in hip strength and range of motion.  Still having some pain at the anterior groin.  Still having some problems and pain heavy activity but overall is feeling improved. Has 2 more PT scheduled.   Send documentation to scan.

## 2020-03-09 DIAGNOSIS — M25651 Stiffness of right hip, not elsewhere classified: Secondary | ICD-10-CM | POA: Diagnosis not present

## 2020-03-09 DIAGNOSIS — M545 Low back pain: Secondary | ICD-10-CM | POA: Diagnosis not present

## 2020-03-09 DIAGNOSIS — M25551 Pain in right hip: Secondary | ICD-10-CM | POA: Diagnosis not present

## 2020-03-09 DIAGNOSIS — M5416 Radiculopathy, lumbar region: Secondary | ICD-10-CM | POA: Diagnosis not present

## 2020-03-14 ENCOUNTER — Telehealth: Payer: Self-pay | Admitting: Family Medicine

## 2020-03-14 NOTE — Telephone Encounter (Signed)
Received end of care physical therapy summary. Patient has had 12 sessions of PT and has significantly improved with pain 1/10.  Plan for ongoing home exercise program. PT report will be sent to scan.

## 2020-03-28 ENCOUNTER — Other Ambulatory Visit: Payer: Self-pay

## 2020-03-28 ENCOUNTER — Ambulatory Visit: Payer: Self-pay

## 2020-03-28 ENCOUNTER — Ambulatory Visit: Payer: PPO | Admitting: Family Medicine

## 2020-03-28 ENCOUNTER — Encounter: Payer: Self-pay | Admitting: Family Medicine

## 2020-03-28 VITALS — BP 110/64 | HR 56 | Ht 75.0 in | Wt 206.0 lb

## 2020-03-28 DIAGNOSIS — M7061 Trochanteric bursitis, right hip: Secondary | ICD-10-CM | POA: Diagnosis not present

## 2020-03-28 DIAGNOSIS — M25551 Pain in right hip: Secondary | ICD-10-CM

## 2020-03-28 NOTE — Progress Notes (Signed)
I, Wendy Poet, LAT, ATC, am serving as scribe for Dr. Lynne Leader.  Eric Suarez is a 74 y.o. male who presents to [facility] today for f/u of chronic low back pain, R hip/buttock and R groin pain.  He was last seen by Dr. Georgina Snell on 01/25/20 and was referred to outpatient PT of which he has completed 12 sessions.  He has been discharged from PT and has been provided a HEP by [place] and by PT.  Since his last visit, pt reports that he was improved short-term after PT but notes that his low back has flared up again.  He also notes that he has fallen after tripping over a tree root and landed on the L side of his body.  Today he notes more R-sided low back, lateral hip and groin.  He denies any numbness/tingling or weakness in his legs.  Lateral hip pain is the majority of his pain.  Diagnostic testing: R hip XR- 01/25/20; L-spine CT- 07/28/17  Pertinent review of systems: No fevers or chills  Relevant historical information: History neoplasm left kidney status post nephrectomy.  History left inguinal hernia   Exam:  BP 110/64 (BP Location: Right Arm, Patient Position: Sitting, Cuff Size: Large)   Pulse (!) 56   Ht 6\' 3"  (1.905 m)   Wt 206 lb (93.4 kg)   SpO2 97%   BMI 25.75 kg/m  General: Well Developed, well nourished, and in no acute distress.   MSK: L-spine: Not particularly tender decreased motion. Right hip normal-appearing tender palpation greater trochanter. Range of motion normal. Hip abduction strength slight diminished and painful.    Lab and Radiology Results  Procedure: Real-time Ultrasound Guided Injection of right hip greater trochanter bursa Device: Philips Affiniti 50G Images permanently stored and available for review in the ultrasound unit. Verbal informed consent obtained.  Discussed risks and benefits of procedure. Warned about infection bleeding damage to structures skin hypopigmentation and fat atrophy among others. Patient  expresses understanding and agreement Time-out conducted.   Noted no overlying erythema, induration, or other signs of local infection.   Skin prepped in a sterile fashion.   Local anesthesia: Topical Ethyl chloride.   With sterile technique and under real time ultrasound guidance:  40 mg of Kenalog and 2 mL of Marcaine injected easily.   Completed without difficulty   Pain immediately resolved suggesting accurate placement of the medication.   Advised to call if fevers/chills, erythema, induration, drainage, or persistent bleeding.   Images permanently stored and available for review in the ultrasound unit.  Impression: Technically successful ultrasound guided injection.     Assessment and Plan: 75 y.o. male with right hip pain.  Multifactorial however the majority of the pain is due to hip abductor tendinopathy/trochanteric bursitis. Patient does not have much groin pain indicating hip arthritis.  Plan for injection as above continued home exercise program.  Ultimately if not better would consider MRI.  Recheck back as needed.    Orders Placed This Encounter  Procedures  . [place] LIMITED JOINT SPACE STRUCTURES LOW RIGHT(NO LINKED CHARGES)    Order Specific Question:   Reason for Exam (SYMPTOM  OR DIAGNOSIS REQUIRED)    Answer:   troch inj    Order Specific Question:   Preferred imaging location?    Answer:   [facility]   No orders of the defined types were placed in this encounter.    Discussed warning signs or symptoms. Please see  discharge instructions. Patient expresses understanding.   The above documentation has been reviewed and is accurate and complete Lynne Leader, M.D.

## 2020-03-28 NOTE — Patient Instructions (Signed)
Thank you for coming in today. Continue your exercises.  Let me know if not better.  Could consider hip MRI if needed.   Call or go to the ER if you develop a large red swollen joint with extreme pain or oozing puss.

## 2020-05-31 DIAGNOSIS — N401 Enlarged prostate with lower urinary tract symptoms: Secondary | ICD-10-CM | POA: Diagnosis not present

## 2020-06-07 DIAGNOSIS — N210 Calculus in bladder: Secondary | ICD-10-CM | POA: Diagnosis not present

## 2020-06-07 DIAGNOSIS — N401 Enlarged prostate with lower urinary tract symptoms: Secondary | ICD-10-CM | POA: Diagnosis not present

## 2020-06-07 DIAGNOSIS — Z85528 Personal history of other malignant neoplasm of kidney: Secondary | ICD-10-CM | POA: Diagnosis not present

## 2020-06-07 DIAGNOSIS — R351 Nocturia: Secondary | ICD-10-CM | POA: Diagnosis not present

## 2020-06-27 DIAGNOSIS — I700 Atherosclerosis of aorta: Secondary | ICD-10-CM | POA: Diagnosis not present

## 2020-06-27 DIAGNOSIS — K8020 Calculus of gallbladder without cholecystitis without obstruction: Secondary | ICD-10-CM | POA: Diagnosis not present

## 2020-06-27 DIAGNOSIS — N400 Enlarged prostate without lower urinary tract symptoms: Secondary | ICD-10-CM | POA: Diagnosis not present

## 2020-06-27 DIAGNOSIS — Z85528 Personal history of other malignant neoplasm of kidney: Secondary | ICD-10-CM | POA: Diagnosis not present

## 2020-06-27 DIAGNOSIS — N210 Calculus in bladder: Secondary | ICD-10-CM | POA: Diagnosis not present

## 2020-09-20 DIAGNOSIS — E032 Hypothyroidism due to medicaments and other exogenous substances: Secondary | ICD-10-CM | POA: Diagnosis not present

## 2020-09-20 DIAGNOSIS — E7800 Pure hypercholesterolemia, unspecified: Secondary | ICD-10-CM | POA: Diagnosis not present

## 2020-09-20 DIAGNOSIS — Z125 Encounter for screening for malignant neoplasm of prostate: Secondary | ICD-10-CM | POA: Diagnosis not present

## 2020-09-20 DIAGNOSIS — E039 Hypothyroidism, unspecified: Secondary | ICD-10-CM | POA: Diagnosis not present

## 2020-09-20 DIAGNOSIS — I10 Essential (primary) hypertension: Secondary | ICD-10-CM | POA: Diagnosis not present

## 2020-09-25 DIAGNOSIS — I10 Essential (primary) hypertension: Secondary | ICD-10-CM | POA: Diagnosis not present

## 2020-09-25 DIAGNOSIS — N400 Enlarged prostate without lower urinary tract symptoms: Secondary | ICD-10-CM | POA: Diagnosis not present

## 2020-09-25 DIAGNOSIS — Z0000 Encounter for general adult medical examination without abnormal findings: Secondary | ICD-10-CM | POA: Diagnosis not present

## 2020-09-25 DIAGNOSIS — E7800 Pure hypercholesterolemia, unspecified: Secondary | ICD-10-CM | POA: Diagnosis not present

## 2020-09-25 DIAGNOSIS — K219 Gastro-esophageal reflux disease without esophagitis: Secondary | ICD-10-CM | POA: Diagnosis not present

## 2020-09-25 DIAGNOSIS — R7303 Prediabetes: Secondary | ICD-10-CM | POA: Diagnosis not present

## 2020-09-25 DIAGNOSIS — N1831 Chronic kidney disease, stage 3a: Secondary | ICD-10-CM | POA: Diagnosis not present

## 2020-09-25 DIAGNOSIS — Z85850 Personal history of malignant neoplasm of thyroid: Secondary | ICD-10-CM | POA: Diagnosis not present

## 2020-09-25 DIAGNOSIS — E032 Hypothyroidism due to medicaments and other exogenous substances: Secondary | ICD-10-CM | POA: Diagnosis not present

## 2020-09-25 DIAGNOSIS — F419 Anxiety disorder, unspecified: Secondary | ICD-10-CM | POA: Diagnosis not present

## 2021-01-04 DIAGNOSIS — H524 Presbyopia: Secondary | ICD-10-CM | POA: Diagnosis not present

## 2021-01-25 DIAGNOSIS — I10 Essential (primary) hypertension: Secondary | ICD-10-CM | POA: Diagnosis not present

## 2021-01-25 DIAGNOSIS — R059 Cough, unspecified: Secondary | ICD-10-CM | POA: Diagnosis not present

## 2021-02-22 ENCOUNTER — Encounter: Payer: Self-pay | Admitting: Dermatology

## 2021-02-22 ENCOUNTER — Ambulatory Visit: Payer: PPO | Admitting: Dermatology

## 2021-02-22 ENCOUNTER — Other Ambulatory Visit: Payer: Self-pay

## 2021-02-22 DIAGNOSIS — L720 Epidermal cyst: Secondary | ICD-10-CM | POA: Diagnosis not present

## 2021-02-22 DIAGNOSIS — L570 Actinic keratosis: Secondary | ICD-10-CM

## 2021-02-22 DIAGNOSIS — D1801 Hemangioma of skin and subcutaneous tissue: Secondary | ICD-10-CM | POA: Diagnosis not present

## 2021-02-22 DIAGNOSIS — L821 Other seborrheic keratosis: Secondary | ICD-10-CM | POA: Diagnosis not present

## 2021-02-22 DIAGNOSIS — L281 Prurigo nodularis: Secondary | ICD-10-CM | POA: Diagnosis not present

## 2021-02-22 DIAGNOSIS — Z1283 Encounter for screening for malignant neoplasm of skin: Secondary | ICD-10-CM

## 2021-03-04 ENCOUNTER — Encounter: Payer: Self-pay | Admitting: Dermatology

## 2021-03-04 NOTE — Progress Notes (Signed)
   Follow-Up Visit   Subjective  Eric Suarez is a 75 y.o. male who presents for the following: Annual Exam (Here for full body skin check- check spot on right shoulder & right side of nose- no changes).  General skin check, new spots arm, shoulder, nose. Location:  Duration:  Quality:  Associated Signs/Symptoms: Modifying Factors:  Severity:  Timing: Context:   Objective  Well appearing patient in no apparent distress; mood and affect are within normal limits. Objective  Scalp: Full body skin check: No atypical moles or nonmobile skin cancer.  Objective  Chest - Medial [facility]): Red smooth 1 to 2 mm papules papules.   Objective  Right Lower Back: Right nose, back:  textured tan 3 mm papules  Objective  Right Forearm - Posterior: Hornlike 4 mm pink crust  Objective  Left Forearm - Anterior, Left Popliteal Fossa: 2 mm gritty pink spots, defer intervention but may consider fluorouracil in the late fall or winter.    A full examination was performed including scalp, head, eyes, ears, nose, lips, neck, chest, axillae, abdomen, back, buttocks, bilateral upper extremities, bilateral lower extremities, hands, feet, fingers, toes, fingernails, and toenails. All findings within normal limits unless otherwise noted below.   Assessment & Plan    Screening exam for skin cancer Scalp  Yearly skin check.  Cherry angioma Chest - Medial [facility])  Intervention not necessary  Seborrheic keratosis Right Lower Back  Leave if stable  AK (actinic keratosis) Right Forearm - Posterior  Destruction of lesion - Right Forearm - Posterior Complexity: simple   Destruction method: cryotherapy   Informed consent: discussed and consent obtained   Timeout:  patient name, date of birth, surgical site, and procedure verified Lesion destroyed using liquid nitrogen: Yes   Cryotherapy cycles:  5 Outcome: patient tolerated procedure well with no complications   Post-procedure  details: wound care instructions given    Epidermal cyst Right Thigh - Posterior  Stable to leave  Actinic keratosis (2) Left Popliteal Fossa; Left Forearm - Anterior  Stable to leave  Picker's nodule Right Upper Arm - Posterior  Stable to leave.      I, Lavonna Monarch, MD, have reviewed all documentation for this visit.  The documentation on 03/04/21 for the exam, diagnosis, procedures, and orders are all accurate and complete.

## 2021-03-07 ENCOUNTER — Encounter: Payer: Self-pay | Admitting: Orthopedic Surgery

## 2021-03-07 ENCOUNTER — Ambulatory Visit: Payer: PPO | Admitting: Orthopedic Surgery

## 2021-03-07 ENCOUNTER — Ambulatory Visit (INDEPENDENT_AMBULATORY_CARE_PROVIDER_SITE_OTHER): Payer: PPO

## 2021-03-07 DIAGNOSIS — M25511 Pain in right shoulder: Secondary | ICD-10-CM

## 2021-03-07 DIAGNOSIS — M7551 Bursitis of right shoulder: Secondary | ICD-10-CM

## 2021-03-07 MED ORDER — LIDOCAINE HCL 1 % IJ SOLN
5.0000 mL | INTRAMUSCULAR | Status: AC | PRN
  Administered 2021-03-07: 5 mL

## 2021-03-07 MED ORDER — BUPIVACAINE HCL 0.5 % IJ SOLN
9.0000 mL | INTRAMUSCULAR | Status: AC | PRN
  Administered 2021-03-07: 9 mL via INTRA_ARTICULAR

## 2021-03-07 MED ORDER — BETAMETHASONE SOD PHOS & ACET 6 (3-3) MG/ML IJ SUSP
6.0000 mg | INTRAMUSCULAR | Status: AC | PRN
  Administered 2021-03-07: 6 mg via INTRA_ARTICULAR

## 2021-03-07 NOTE — Progress Notes (Signed)
Office Visit Note   Patient: Eric Suarez           Date of Birth: Jul 05, 1946           MRN: [id number] Visit Date: 03/07/2021 Requested by: Deland Pretty, MD [place],  [place] [address] PCP: Deland Pretty, MD  Subjective: Chief Complaint  Patient presents with  . Right Shoulder - Pain    HPI: Meer is a 75 year old patient with right shoulder pain.  He has had several injections in the past.  Reports chronic pain but has been worse anteriorly and posteriorly for the past several weeks.  Does a lot of physical outside work.  Pain does wake him from sleep at night.  He is right-hand dominant.  Denies any neck pain or radicular symptoms.  Does have history of what sounds like focal arthritis in the wrist joint as well as some numbness and tingling and possible carpal tunnel syndrome.  Since he was last seen he has been diagnosed with kidney cancer and had his kidney removed.  He also has had 4 hernias with mesh placed for recurrence.              ROS: All systems reviewed are negative as they relate to the chief complaint within the history of present illness.  Patient denies  fevers or chills.   Assessment & Plan: Visit Diagnoses:  1. Right shoulder pain, unspecified chronicity     Plan: Impression is right shoulder bursitis possible rotator cuff tearing but with not much loss of strength and no loss of passive range of motion.  Plan is subacromial injection today.  I think he may want to get in a shot in the near future as well.  Has arthritis in both knees but is doing reasonably well with that and so no intervention indicated for that at this time.   Follow-Up Instructions: Return if symptoms worsen or fail to improve.   Orders:  Orders Placed This Encounter  Procedures  . XR Shoulder Right   No orders of the defined types were placed in this encounter.     Procedures: Large Joint Inj: R subacromial bursa on 03/07/2021 12:31 PM Indications:  diagnostic evaluation and pain Details: 18 G 1.5 in needle, posterior approach  Arthrogram: No  Medications: 9 mL bupivacaine 0.5 %; 5 mL lidocaine 1 %; 6 mg betamethasone acetate-betamethasone sodium phosphate 6 (3-3) MG/ML Outcome: tolerated well, no immediate complications Procedure, treatment alternatives, risks and benefits explained, specific risks discussed. Consent was given by the patient. Immediately prior to procedure a time out was called to verify the correct patient, procedure, equipment, support staff and site/side marked as required. Patient was prepped and draped in the usual sterile fashion.       Clinical Data: No additional findings.  Objective: Vital Signs: There were no vitals taken for this visit.  Physical Exam:   Constitutional: Patient appears well-developed HEENT:  Head: Normocephalic Eyes:EOM are normal Neck: Normal range of motion Cardiovascular: Normal rate Pulmonary/chest: Effort normal Neurologic: Patient is alert Skin: Skin is warm Psychiatric: Patient has normal mood and affect    Ortho Exam: Ortho exam demonstrates good passive shoulder range of motion with 50/100/170.  On the right-hand side.  Rotator cuff strength is pretty reasonable infraspinatus x-ray subscap testing but he does have some coarse grinding with active and passive range of motion at 90 degrees abduction.  No discrete AC joint tenderness is present.  No masses lymphadenopathy or skin  changes noted in that shoulder girdle region.  Radiographs also do not show any type of metastatic bony lesion.  Specialty Comments:  No specialty comments available.  Imaging: XR Shoulder Right  Result Date: 03/07/2021 AP, axillary radiographs right shoulder reviewed.  Acromiohumeral distance maintained.  Mild AC joint degenerative changes present.  No acute fracture.  Shoulder is located.  Visualized lung fields clear.    PMFS History: Patient Active Problem List   Diagnosis Date Noted   . S/P hernia repair 10/08/2017  . Chronic pain of both knees 09/04/2017  . Folliculitis 07/37/1062  . SBO (small bowel obstruction) ([place]) 01/10/2017  . Post-operative complication 69/48/5462  . Severe sepsis ([place]) 12/25/2016  . Flu-like symptoms 12/25/2016  . Dehydration with hyponatremia 12/25/2016  . AKI (acute kidney injury) ([place]) 12/25/2016  . CKD (chronic kidney disease) stage 3, GFR 30-59 ml/min ([facility]) 12/25/2016  . S/P repair of ventral hernia 12/25/2016  . Hypothyroidism 12/25/2016  . HTN (hypertension) 12/25/2016  . Neoplasm of left kidney 04/18/2016  . Postoperative examination 03/07/2016  . Strangulated inguinal hernia 02/29/2016   Past Medical History:  Diagnosis Date  . Anxiety   . Arthritis   . Cancer [facility]) [id number]   thryoid, left kidney  . Cataracts, bilateral   . Complication of anesthesia    unable to void after surgery  . Coronary artery disease    pt denies- had stress>10 yrs ago due to"get winded walking uphill'- neg  . GERD (gastroesophageal reflux disease)   . History of hiatal hernia   . History of kidney surgery    left kidney removed due to cancer 2017  . Hypertension   . Hypothyroidism   . Left renal mass   . Macular degeneration of both eyes    dry  . PONV (postoperative nausea and vomiting)     History reviewed. No pertinent family history.  Past Surgical History:  Procedure Laterality Date  . APPENDECTOMY    . COLONOSCOPY    . CYSTOSCOPY WITH RETROGRADE PYELOGRAM, URETEROSCOPY AND STENT PLACEMENT Left 04/01/2016   Procedure: CYSTOSCOPY WITH RETROGRADE PYELOGRAM, left ureter;  Surgeon: Raynelle Bring, MD;  Location: [facility];  Service: Urology;  Laterality: Left;  . HEMORRHOID SURGERY    . HERNIA REPAIR     inguinal-3 on left , 1 on right  . INCISIONAL HERNIA REPAIR N/A 12/23/2016   Procedure: LAPAROSCOPIC VERSUS OPEN LYSIS OF ADHESIONS AND INCISIONAL HERNIA REPAIR WITH MESH;  Surgeon: Ralene Ok, MD;  Location: [place];  Service:  General;  Laterality: N/A;  . [place]  10/08/2017   OPEN  . INCISIONAL HERNIA REPAIR N/A 10/08/2017   Procedure: OPEN HERNIA REPAIR INCISIONAL ERAS PATHWAY;  Surgeon: Ralene Ok, MD;  Location: [place];  Service: General;  Laterality: N/A;  . INSERTION OF MESH N/A 12/23/2016   Procedure: INSERTION OF MESH;  Surgeon: Ralene Ok, MD;  Location: [place];  Service: General;  Laterality: N/A;  . INSERTION OF MESH N/A 01/10/2017   Procedure: INSERTION OF MESH;  Surgeon: Ralene Ok, MD;  Location: [place];  Service: General;  Laterality: N/A;  . INSERTION OF MESH N/A 10/08/2017   Procedure: INSERTION OF MESH;  Surgeon: Ralene Ok, MD;  Location: [place];  Service: General;  Laterality: N/A;  . LAPAROSCOPIC NEPHRECTOMY Left 04/18/2016   Procedure: LAPAROSCOPIC RADICAL  LEFT NEPHRECTOMY;  Surgeon: Raynelle Bring, MD;  Location: [facility];  Service: Urology;  Laterality: Left;  . LAPAROSCOPY N/A 01/10/2017   Procedure: LAPAROSCOPY DIAGNOSTIC , POSSIBLE  LAPAROTOMY;  Surgeon: Ralene Ok, MD;  Location: [place];  Service: General;  Laterality: N/A;  . NASAL SINUS SURGERY     left side  . NASAL SINUS SURGERY Left 10/25/2019   Procedure: ENDOSCOPIC SINUS SURGERY/ethmoid and maxillary;  Surgeon: Izora Gala, MD;  Location: [place];  Service: ENT;  Laterality: Left;  . THYROIDECTOMY, PARTIAL     left small amount on left side-for cancer  . TONSILLECTOMY     Social History   Occupational History  . Not on file  Tobacco Use  . Smoking status: Never Smoker  . Smokeless tobacco: Never Used  Vaping Use  . Vaping Use: Never used  Substance and Sexual Activity  . Alcohol use: No  . Drug use: No  . Sexual activity: Not on file

## 2021-04-04 ENCOUNTER — Ambulatory Visit: Payer: PPO | Admitting: Orthopedic Surgery

## 2021-04-18 ENCOUNTER — Ambulatory Visit: Payer: Self-pay

## 2021-04-18 ENCOUNTER — Ambulatory Visit: Payer: PPO | Admitting: Orthopedic Surgery

## 2021-04-18 DIAGNOSIS — M1712 Unilateral primary osteoarthritis, left knee: Secondary | ICD-10-CM

## 2021-04-18 DIAGNOSIS — M25511 Pain in right shoulder: Secondary | ICD-10-CM

## 2021-04-18 DIAGNOSIS — M25562 Pain in left knee: Secondary | ICD-10-CM

## 2021-04-20 ENCOUNTER — Encounter: Payer: Self-pay | Admitting: Orthopedic Surgery

## 2021-04-20 MED ORDER — METHYLPREDNISOLONE ACETATE 40 MG/ML IJ SUSP
40.0000 mg | INTRAMUSCULAR | Status: AC | PRN
  Administered 2021-04-18: 40 mg via INTRA_ARTICULAR

## 2021-04-20 MED ORDER — LIDOCAINE HCL 1 % IJ SOLN
5.0000 mL | INTRAMUSCULAR | Status: AC | PRN
  Administered 2021-04-18: 5 mL

## 2021-04-20 MED ORDER — BUPIVACAINE HCL 0.25 % IJ SOLN
4.0000 mL | INTRAMUSCULAR | Status: AC | PRN
  Administered 2021-04-18: 4 mL via INTRA_ARTICULAR

## 2021-04-20 NOTE — Progress Notes (Signed)
Office Visit Note   Patient: Eric Suarez           Date of Birth: [birth year]           MRN: [id number] Visit Date: 04/18/2021 Requested by: Deland Pretty, MD [place],  [place] [address] PCP: Deland Pretty, MD  Subjective: Chief Complaint  Patient presents with  . Right Shoulder - Follow-up  . Left Knee - Pain    HPI: Eric Suarez is a 75 year old patient with left knee pain.  Also here for right shoulder follow-up.  Shoulder subacromial injection in April only gave him minimal relief.  Helped him about 20 to 25%.  He also reports left knee pain.  Has no high school injury but reports worsening pain.  States that he "has fluid on my knee".  Reports stiffness as well as pain that wakes him from sleep at night.  Takes Tylenol for symptoms.  Cannot take anti-inflammatories due to only having 1 kidney.  Hard for him to do physical work because of his right shoulder.  He wears a brace on his left knee.  Hard for him to sleep.  Describes mechanical symptoms in the shoulder.              ROS: All systems reviewed are negative as they relate to the chief complaint within the history of present illness.  Patient denies  fevers or chills.   Assessment & Plan: Visit Diagnoses:  1. Left knee pain, unspecified chronicity   2. Right shoulder pain, unspecified chronicity     Plan: Impression is left knee patellofemoral arthritis with effusion.  Aspiration injection performed today.  Regarding the right shoulder I think he has possible SLAP tear.  His rotator cuff strength is good but he does have history examination and symptoms consistent with SLAP tear.  Follow-up after MRI arthrogram of the right shoulder.  Follow-Up Instructions: Return for after MRI.   Orders:  Orders Placed This Encounter  Procedures  . XR KNEE 3 VIEW LEFT  . MR SHOULDER RIGHT W CONTRAST  . Arthrogram   No orders of the defined types were placed in this encounter.     Procedures: Large  Joint Inj: L knee on 04/18/2021 10:00 AM Indications: diagnostic evaluation, joint swelling and pain Details: 18 G 1.5 in needle, superolateral approach  Arthrogram: No  Medications: 5 mL lidocaine 1 %; 40 mg methylPREDNISolone acetate 40 MG/ML; 4 mL bupivacaine 0.25 % Outcome: tolerated well, no immediate complications Procedure, treatment alternatives, risks and benefits explained, specific risks discussed. Consent was given by the patient. Immediately prior to procedure a time out was called to verify the correct patient, procedure, equipment, support staff and site/side marked as required. Patient was prepped and draped in the usual sterile fashion.      Clinical Data: No additional findings.  Objective: Vital Signs: There were no vitals taken for this visit.  Physical Exam:   Constitutional: Patient appears well-developed HEENT:  Head: Normocephalic Eyes:EOM are normal Neck: Normal range of motion Cardiovascular: Normal rate Pulmonary/chest: Effort normal Neurologic: Patient is alert Skin: Skin is warm Psychiatric: Patient has normal mood and affect   Ortho Exam: Ortho exam demonstrates left knee effusion with patellofemoral crepitus.  Range of motion is full with full extension and flexion to about 125.  Collateral cruciate ligaments are stable.  Has mild medial lateral joint line tenderness with palpable pedal pulses and no masses lymphadenopathy or skin changes noted in that left knee region.  Right shoulder is examined.  Good rotator cuff strength demonstrated supraspinatus absent muscle testing was positive O'Brien's testing with radiation into the biceps tendon.  No AC joint tenderness is present.  No restriction of external rotation.  Passive range of motion on that right shoulder is 50/100/170.  Specialty Comments:  No specialty comments available.  Imaging: No results found.   PMFS History: Patient Active Problem List   Diagnosis Date Noted  . S/P hernia  repair 10/08/2017  . Chronic pain of both knees 09/04/2017  . Folliculitis 30/16/0109  . SBO (small bowel obstruction) ([place]) 01/10/2017  . Post-operative complication 32/35/5732  . Severe sepsis ([place]) 12/25/2016  . Flu-like symptoms 12/25/2016  . Dehydration with hyponatremia 12/25/2016  . AKI (acute kidney injury) ([facility] [place]) 12/25/2016  . CKD (chronic kidney disease) stage 3, GFR 30-59 ml/min ([facility]) 12/25/2016  . S/P repair of ventral hernia 12/25/2016  . Hypothyroidism 12/25/2016  . HTN (hypertension) 12/25/2016  . Neoplasm of left kidney 04/18/2016  . Postoperative examination 03/07/2016  . Strangulated inguinal hernia 02/29/2016   Past Medical History:  Diagnosis Date  . Anxiety   . Arthritis   . Cancer [facility]) [id number]   thryoid, left kidney  . Cataracts, bilateral   . Complication of anesthesia    unable to void after surgery  . Coronary artery disease    pt denies- had stress>10 yrs ago due to"get winded walking uphill'- neg  . GERD (gastroesophageal reflux disease)   . History of hiatal hernia   . History of kidney surgery    left kidney removed due to cancer 2017  . Hypertension   . Hypothyroidism   . Left renal mass   . Macular degeneration of both eyes    dry  . PONV (postoperative nausea and vomiting)     History reviewed. No pertinent family history.  Past Surgical History:  Procedure Laterality Date  . APPENDECTOMY    . COLONOSCOPY    . CYSTOSCOPY WITH RETROGRADE PYELOGRAM, URETEROSCOPY AND STENT PLACEMENT Left 04/01/2016   Procedure: CYSTOSCOPY WITH RETROGRADE PYELOGRAM, left ureter;  Surgeon: Raynelle Bring, MD;  [facility];  Service: Urology;  Laterality: Left;  . HEMORRHOID SURGERY    . HERNIA REPAIR     inguinal-3 on left , 1 on right  . INCISIONAL HERNIA REPAIR N/A 12/23/2016   Procedure: LAPAROSCOPIC VERSUS OPEN LYSIS OF ADHESIONS AND INCISIONAL HERNIA REPAIR WITH MESH;  Surgeon: Ralene Ok, MD;  Location: [place];  Service: General;  Laterality:  N/A;  . [place]  10/08/2017   OPEN  . INCISIONAL HERNIA REPAIR N/A 10/08/2017   Procedure: OPEN HERNIA REPAIR INCISIONAL ERAS PATHWAY;  Surgeon: Ralene Ok, MD;  Location: [place];  Service: General;  Laterality: N/A;  . INSERTION OF MESH N/A 12/23/2016   Procedure: INSERTION OF MESH;  Surgeon: Ralene Ok, MD;  Location: [place];  Service: General;  Laterality: N/A;  . INSERTION OF MESH N/A 01/10/2017   Procedure: INSERTION OF MESH;  Surgeon: Ralene Ok, MD;  Location: [facility];  Service: General;  Laterality: N/A;  . INSERTION OF MESH N/A 10/08/2017   Procedure: INSERTION OF MESH;  Surgeon: Ralene Ok, MD;  Location: [place];  Service: General;  Laterality: N/A;  . LAPAROSCOPIC NEPHRECTOMY Left 04/18/2016   Procedure: LAPAROSCOPIC RADICAL  LEFT NEPHRECTOMY;  Surgeon: Raynelle Bring, MD;  [facility];  Service: Urology;  Laterality: Left;  . LAPAROSCOPY N/A 01/10/2017   Procedure: LAPAROSCOPY DIAGNOSTIC , POSSIBLE LAPAROTOMY;  Surgeon: Ralene Ok, MD;  [facility];  Service: General;  Laterality: N/A;  . NASAL SINUS SURGERY     left side  . NASAL SINUS SURGERY Left 10/25/2019   Procedure: ENDOSCOPIC SINUS SURGERY/ethmoid and maxillary;  Surgeon: Izora Gala, MD;  Location: [facility];  Service: ENT;  Laterality: Left;  . THYROIDECTOMY, PARTIAL     left small amount on left side-for cancer  . TONSILLECTOMY     Social History   Occupational History  . Not on file  Tobacco Use  . Smoking status: Never  . Smokeless tobacco: Never  Vaping Use  . Vaping Use: Never used  Substance and Sexual Activity  . Alcohol use: No  . Drug use: No  . Sexual activity: Not on file

## 2021-05-03 ENCOUNTER — Telehealth: Payer: Self-pay | Admitting: Orthopedic Surgery

## 2021-05-03 NOTE — Telephone Encounter (Signed)
Called patient left message to return call to schedule appointment for MRI review with Dr Marlou Sa

## 2021-05-04 ENCOUNTER — Ambulatory Visit
Admission: RE | Admit: 2021-05-04 | Discharge: 2021-05-04 | Disposition: A | Discharge status: Home / Self Care | Payer: PPO | Source: Ambulatory Visit | Attending: Orthopedic Surgery | Admitting: Orthopedic Surgery

## 2021-05-04 ENCOUNTER — Other Ambulatory Visit: Payer: Self-pay

## 2021-05-04 DIAGNOSIS — M25511 Pain in right shoulder: Secondary | ICD-10-CM | POA: Diagnosis not present

## 2021-05-04 DIAGNOSIS — S46011A Strain of muscle(s) and tendon(s) of the rotator cuff of right shoulder, initial encounter: Secondary | ICD-10-CM | POA: Diagnosis not present

## 2021-05-04 MED ORDER — IOPAMIDOL (ISOVUE-M 200) INJECTION 41%
15.0000 mL | Freq: Once | INTRAMUSCULAR | Status: AC
  Administered 2021-05-04: 15 mL via INTRA_ARTICULAR

## 2021-05-16 ENCOUNTER — Ambulatory Visit (INDEPENDENT_AMBULATORY_CARE_PROVIDER_SITE_OTHER): Payer: PPO | Admitting: Orthopedic Surgery

## 2021-05-16 DIAGNOSIS — M75101 Unspecified rotator cuff tear or rupture of right shoulder, not specified as traumatic: Secondary | ICD-10-CM | POA: Diagnosis not present

## 2021-05-20 ENCOUNTER — Encounter: Payer: Self-pay | Admitting: Orthopedic Surgery

## 2021-05-20 NOTE — Progress Notes (Signed)
Office Visit Note   Patient: Eric Suarez           Date of Birth: Jul 28, 1946           MRN: [id number] Visit Date: 05/16/2021 Requested by: Deland Pretty, MD [address] [place],  [place] [address] PCP: Deland Pretty, MD  Subjective: Chief Complaint  Patient presents with   Other     MRI review    HPI: Eric Suarez is a 75 y.o. male who presents to the office complaining of continued right shoulder pain.  Patient states that last injection he received did help but he was hit in the shoulder with a tailgate several weeks after the injection which caused recurrence of his pain.  He did not have full relief from the injection.  Localizes most of his pain to the lateral, anterior, posterior aspect of the right shoulder and feels that the pain "moves around" depending on the day.  He is using Aspercreme and heat which provide some relief.  He does occasionally wake him up at night.  Most of the pain he feels is when he lifts his arm or lift something away from his body.  Denies any clicking or mechanical symptoms.  No history of prior surgery..                ROS: All systems reviewed are negative as they relate to the chief complaint within the history of present illness.  Patient denies fevers or chills.  Assessment & Plan: Visit Diagnoses:  1. Tear of right supraspinatus tendon     Plan: Patient is a 75 year old male who presents complaining of continued right shoulder pain.  He is here today to review MRI of the right shoulder.  MRI revealed moderate tendinosis of the supraspinatus with high-grade partial-thickness bursal sided tear as well as tendinosis of the infra and subscap without tears.  Also ha mild tendinosis of the long head of the bicep with a SLAP tear.  Discussed options available to patient.  He wants to avoid surgery if at all possible.  Plan to refer patient to physical therapy for 6 weeks of PT to focus on rotator cuff strengthening.  Follow-up in  6 weeks for clinical recheck to see how much relief this provides and decide for or against surgery.  Follow-Up Instructions: No follow-ups on file.   Orders:  No orders of the defined types were placed in this encounter.  No orders of the defined types were placed in this encounter.     Procedures: No procedures performed   Clinical Data: No additional findings.  Objective: Vital Signs: There were no vitals taken for this visit.  Physical Exam:  Constitutional: Patient appears well-developed HEENT:  Head: Normocephalic Eyes:EOM are normal Neck: Normal range of motion Cardiovascular: Normal rate Pulmonary/chest: Effort normal Neurologic: Patient is alert Skin: Skin is warm Psychiatric: Patient has normal mood and affect  Ortho Exam: Ortho exam demonstrates right shoulder with 60 degrees external rotation, 90 degrees abduction, 170 degrees forward flexion.  5/5 motor strength of supra, infra, subscap but increased pain with supraspinatus strength testing.  No AC joint tenderness.  No significant bicipital groove tenderness.  Negative belly press test, negative Hornblower sign, negative drop arm test.  Mildly positive O'Brien sign.  Specialty Comments:  No specialty comments available.  Imaging: No results found.   PMFS History: Patient Active Problem List   Diagnosis Date Noted   S/P hernia repair 10/08/2017   Chronic pain  of both knees 61/44/3154   Folliculitis 00/86/7619   SBO (small bowel obstruction) ([place]) 01/10/2017   Post-operative complication 50/93/2671   Severe sepsis ([place]) 12/25/2016   Flu-like symptoms 12/25/2016   Dehydration with hyponatremia 12/25/2016   AKI (acute kidney injury) ([place]) 12/25/2016   CKD (chronic kidney disease) stage 3, GFR 30-59 ml/min ([place]) 12/25/2016   S/P repair of ventral hernia 12/25/2016   Hypothyroidism 12/25/2016   HTN (hypertension) 12/25/2016   Neoplasm of left kidney 04/18/2016   Postoperative examination 03/07/2016    Strangulated inguinal hernia 02/29/2016   Past Medical History:  Diagnosis Date   Anxiety    Arthritis    Cancer ([place]) [phone]   thryoid, left kidney   Cataracts, bilateral    Complication of anesthesia    unable to void after surgery   Coronary artery disease    pt denies- had stress>10 yrs ago due to"get winded walking uphill'- neg   GERD (gastroesophageal reflux disease)    History of hiatal hernia    History of kidney surgery    left kidney removed due to cancer 2017   Hypertension    Hypothyroidism    Left renal mass    Macular degeneration of both eyes    dry   PONV (postoperative nausea and vomiting)     No family history on file.  Past Surgical History:  Procedure Laterality Date   APPENDECTOMY     COLONOSCOPY     CYSTOSCOPY WITH RETROGRADE PYELOGRAM, URETEROSCOPY AND STENT PLACEMENT Left 04/01/2016   Procedure: CYSTOSCOPY WITH RETROGRADE PYELOGRAM, left ureter;  Surgeon: Raynelle Bring, MD;  [facility];  Service: Urology;  Laterality: Left;   HEMORRHOID SURGERY     HERNIA REPAIR     inguinal-3 on left , 1 on right   INCISIONAL HERNIA REPAIR N/A 12/23/2016   Procedure: LAPAROSCOPIC VERSUS OPEN LYSIS OF ADHESIONS AND INCISIONAL HERNIA REPAIR WITH MESH;  Surgeon: Ralene Ok, MD;  Location: [facility];  Service: General;  Laterality: N/A;   [place]  10/08/2017   OPEN   INCISIONAL HERNIA REPAIR N/A 10/08/2017   Procedure: [place];  Surgeon: Ralene Ok, MD;  Location: [place];  Service: General;  Laterality: N/A;   INSERTION OF MESH N/A 12/23/2016   Procedure: INSERTION OF MESH;  Surgeon: Ralene Ok, MD;  Location: [facility];  Service: General;  Laterality: N/A;   INSERTION OF MESH N/A 01/10/2017   Procedure: INSERTION OF MESH;  Surgeon: Ralene Ok, MD;  Location: [facility];  Service: General;  Laterality: N/A;   INSERTION OF MESH N/A 10/08/2017   Procedure: INSERTION OF MESH;  Surgeon: Ralene Ok,  MD;  Location: [facility];  Service: General;  Laterality: N/A;   LAPAROSCOPIC NEPHRECTOMY Left 04/18/2016   Procedure: LAPAROSCOPIC RADICAL  LEFT NEPHRECTOMY;  Surgeon: Raynelle Bring, MD;  [facility];  Service: Urology;  Laterality: Left;   LAPAROSCOPY N/A 01/10/2017   Procedure: LAPAROSCOPY DIAGNOSTIC , POSSIBLE LAPAROTOMY;  Surgeon: Ralene Ok, MD;  Location: [facility];  Service: General;  Laterality: N/A;   NASAL SINUS SURGERY     left side   NASAL SINUS SURGERY Left 10/25/2019   Procedure: ENDOSCOPIC SINUS SURGERY/ethmoid and maxillary;  Surgeon: Izora Gala, MD;  Location: [facility];  Service: ENT;  Laterality: Left;   THYROIDECTOMY, PARTIAL     left small amount on left side-for cancer   TONSILLECTOMY     Social History   Occupational History   Not on  file  Tobacco Use   Smoking status: Never   Smokeless tobacco: Never  Vaping Use   Vaping Use: Never used  Substance and Sexual Activity   Alcohol use: No   Drug use: No   Sexual activity: Not on file

## 2021-05-21 ENCOUNTER — Telehealth: Payer: Self-pay | Admitting: Orthopedic Surgery

## 2021-05-21 NOTE — Telephone Encounter (Signed)
faxed

## 2021-05-21 NOTE — Telephone Encounter (Signed)
Eric Suarez with [facility] called stating the pt has a PT eval tomorrow morning and would like to have the appt notes from 05/16/21 faxed over.   Fax# [phone] Eric Suarez CB# [phone]

## 2021-05-22 DIAGNOSIS — Z85528 Personal history of other malignant neoplasm of kidney: Secondary | ICD-10-CM | POA: Diagnosis not present

## 2021-05-22 DIAGNOSIS — K8020 Calculus of gallbladder without cholecystitis without obstruction: Secondary | ICD-10-CM | POA: Diagnosis not present

## 2021-05-22 DIAGNOSIS — C642 Malignant neoplasm of left kidney, except renal pelvis: Secondary | ICD-10-CM | POA: Diagnosis not present

## 2021-05-22 DIAGNOSIS — M5136 Other intervertebral disc degeneration, lumbar region: Secondary | ICD-10-CM | POA: Diagnosis not present

## 2021-05-22 DIAGNOSIS — K439 Ventral hernia without obstruction or gangrene: Secondary | ICD-10-CM | POA: Diagnosis not present

## 2021-05-23 DIAGNOSIS — M25511 Pain in right shoulder: Secondary | ICD-10-CM | POA: Diagnosis not present

## 2021-05-23 DIAGNOSIS — R293 Abnormal posture: Secondary | ICD-10-CM | POA: Diagnosis not present

## 2021-05-23 DIAGNOSIS — M25611 Stiffness of right shoulder, not elsewhere classified: Secondary | ICD-10-CM | POA: Diagnosis not present

## 2021-05-23 DIAGNOSIS — M6281 Muscle weakness (generalized): Secondary | ICD-10-CM | POA: Diagnosis not present

## 2021-05-29 DIAGNOSIS — R293 Abnormal posture: Secondary | ICD-10-CM | POA: Diagnosis not present

## 2021-05-29 DIAGNOSIS — M6281 Muscle weakness (generalized): Secondary | ICD-10-CM | POA: Diagnosis not present

## 2021-05-29 DIAGNOSIS — M25511 Pain in right shoulder: Secondary | ICD-10-CM | POA: Diagnosis not present

## 2021-05-29 DIAGNOSIS — M25611 Stiffness of right shoulder, not elsewhere classified: Secondary | ICD-10-CM | POA: Diagnosis not present

## 2021-05-31 DIAGNOSIS — M25511 Pain in right shoulder: Secondary | ICD-10-CM | POA: Diagnosis not present

## 2021-05-31 DIAGNOSIS — M25611 Stiffness of right shoulder, not elsewhere classified: Secondary | ICD-10-CM | POA: Diagnosis not present

## 2021-05-31 DIAGNOSIS — M6281 Muscle weakness (generalized): Secondary | ICD-10-CM | POA: Diagnosis not present

## 2021-05-31 DIAGNOSIS — R293 Abnormal posture: Secondary | ICD-10-CM | POA: Diagnosis not present

## 2021-06-04 DIAGNOSIS — M25511 Pain in right shoulder: Secondary | ICD-10-CM | POA: Diagnosis not present

## 2021-06-04 DIAGNOSIS — R293 Abnormal posture: Secondary | ICD-10-CM | POA: Diagnosis not present

## 2021-06-04 DIAGNOSIS — M25611 Stiffness of right shoulder, not elsewhere classified: Secondary | ICD-10-CM | POA: Diagnosis not present

## 2021-06-04 DIAGNOSIS — M6281 Muscle weakness (generalized): Secondary | ICD-10-CM | POA: Diagnosis not present

## 2021-06-05 DIAGNOSIS — Z1211 Encounter for screening for malignant neoplasm of colon: Secondary | ICD-10-CM | POA: Diagnosis not present

## 2021-06-05 DIAGNOSIS — Z86010 Personal history of colonic polyps: Secondary | ICD-10-CM | POA: Diagnosis not present

## 2021-06-05 DIAGNOSIS — D123 Benign neoplasm of transverse colon: Secondary | ICD-10-CM | POA: Diagnosis not present

## 2021-06-05 DIAGNOSIS — K635 Polyp of colon: Secondary | ICD-10-CM | POA: Diagnosis not present

## 2021-06-05 DIAGNOSIS — K5730 Diverticulosis of large intestine without perforation or abscess without bleeding: Secondary | ICD-10-CM | POA: Diagnosis not present

## 2021-06-05 DIAGNOSIS — D125 Benign neoplasm of sigmoid colon: Secondary | ICD-10-CM | POA: Diagnosis not present

## 2021-06-05 DIAGNOSIS — D124 Benign neoplasm of descending colon: Secondary | ICD-10-CM | POA: Diagnosis not present

## 2021-06-05 DIAGNOSIS — D122 Benign neoplasm of ascending colon: Secondary | ICD-10-CM | POA: Diagnosis not present

## 2021-06-07 DIAGNOSIS — N210 Calculus in bladder: Secondary | ICD-10-CM | POA: Diagnosis not present

## 2021-06-07 DIAGNOSIS — R351 Nocturia: Secondary | ICD-10-CM | POA: Diagnosis not present

## 2021-06-07 DIAGNOSIS — Z85528 Personal history of other malignant neoplasm of kidney: Secondary | ICD-10-CM | POA: Diagnosis not present

## 2021-06-07 DIAGNOSIS — N401 Enlarged prostate with lower urinary tract symptoms: Secondary | ICD-10-CM | POA: Diagnosis not present

## 2021-06-08 DIAGNOSIS — R293 Abnormal posture: Secondary | ICD-10-CM | POA: Diagnosis not present

## 2021-06-08 DIAGNOSIS — M25611 Stiffness of right shoulder, not elsewhere classified: Secondary | ICD-10-CM | POA: Diagnosis not present

## 2021-06-08 DIAGNOSIS — M6281 Muscle weakness (generalized): Secondary | ICD-10-CM | POA: Diagnosis not present

## 2021-06-08 DIAGNOSIS — M25511 Pain in right shoulder: Secondary | ICD-10-CM | POA: Diagnosis not present

## 2021-06-12 DIAGNOSIS — M25511 Pain in right shoulder: Secondary | ICD-10-CM | POA: Diagnosis not present

## 2021-06-12 DIAGNOSIS — R293 Abnormal posture: Secondary | ICD-10-CM | POA: Diagnosis not present

## 2021-06-12 DIAGNOSIS — M25611 Stiffness of right shoulder, not elsewhere classified: Secondary | ICD-10-CM | POA: Diagnosis not present

## 2021-06-12 DIAGNOSIS — M6281 Muscle weakness (generalized): Secondary | ICD-10-CM | POA: Diagnosis not present

## 2021-06-15 DIAGNOSIS — M6281 Muscle weakness (generalized): Secondary | ICD-10-CM | POA: Diagnosis not present

## 2021-06-15 DIAGNOSIS — R293 Abnormal posture: Secondary | ICD-10-CM | POA: Diagnosis not present

## 2021-06-15 DIAGNOSIS — M25611 Stiffness of right shoulder, not elsewhere classified: Secondary | ICD-10-CM | POA: Diagnosis not present

## 2021-06-15 DIAGNOSIS — M25511 Pain in right shoulder: Secondary | ICD-10-CM | POA: Diagnosis not present

## 2021-06-19 DIAGNOSIS — M25611 Stiffness of right shoulder, not elsewhere classified: Secondary | ICD-10-CM | POA: Diagnosis not present

## 2021-06-19 DIAGNOSIS — R293 Abnormal posture: Secondary | ICD-10-CM | POA: Diagnosis not present

## 2021-06-19 DIAGNOSIS — M25511 Pain in right shoulder: Secondary | ICD-10-CM | POA: Diagnosis not present

## 2021-06-19 DIAGNOSIS — M6281 Muscle weakness (generalized): Secondary | ICD-10-CM | POA: Diagnosis not present

## 2021-06-21 DIAGNOSIS — M25511 Pain in right shoulder: Secondary | ICD-10-CM | POA: Diagnosis not present

## 2021-06-21 DIAGNOSIS — M6281 Muscle weakness (generalized): Secondary | ICD-10-CM | POA: Diagnosis not present

## 2021-06-21 DIAGNOSIS — R293 Abnormal posture: Secondary | ICD-10-CM | POA: Diagnosis not present

## 2021-06-21 DIAGNOSIS — M25611 Stiffness of right shoulder, not elsewhere classified: Secondary | ICD-10-CM | POA: Diagnosis not present

## 2021-06-25 DIAGNOSIS — M6281 Muscle weakness (generalized): Secondary | ICD-10-CM | POA: Diagnosis not present

## 2021-06-25 DIAGNOSIS — M25611 Stiffness of right shoulder, not elsewhere classified: Secondary | ICD-10-CM | POA: Diagnosis not present

## 2021-06-25 DIAGNOSIS — M25511 Pain in right shoulder: Secondary | ICD-10-CM | POA: Diagnosis not present

## 2021-06-25 DIAGNOSIS — R293 Abnormal posture: Secondary | ICD-10-CM | POA: Diagnosis not present

## 2021-06-27 ENCOUNTER — Other Ambulatory Visit: Payer: Self-pay

## 2021-06-27 ENCOUNTER — Ambulatory Visit: Payer: Self-pay

## 2021-06-27 ENCOUNTER — Ambulatory Visit: Payer: PPO | Admitting: Orthopedic Surgery

## 2021-06-27 DIAGNOSIS — M7551 Bursitis of right shoulder: Secondary | ICD-10-CM

## 2021-06-27 DIAGNOSIS — M25511 Pain in right shoulder: Secondary | ICD-10-CM

## 2021-06-27 DIAGNOSIS — M75101 Unspecified rotator cuff tear or rupture of right shoulder, not specified as traumatic: Secondary | ICD-10-CM

## 2021-06-27 NOTE — Progress Notes (Signed)
Subjective: Patient is here for ultrasound-guided intra-articular right glenohumeral injection.  Ongoing pain.  Objective:  Pain with reaching across body, and with ER.  Procedure: Ultrasound guided injection is preferred based studies that show increased duration, increased effect, greater accuracy, decreased procedural pain, increased response rate, and decreased cost with ultrasound guided versus blind injection.   Verbal informed consent obtained.  Time-out conducted.  Noted no overlying erythema, induration, or other signs of local infection. Ultrasound-guided right glenohumeral injection: After sterile prep with Betadine, injected 5 cc 1% lidocaine without epinephrine and 40 mg Depo-Medrol using a 22-gauge spinal needle, passing the needle from posterior approach into the glenohumeral joint.  Injectate seen filling joint capsule.

## 2021-07-03 DIAGNOSIS — R293 Abnormal posture: Secondary | ICD-10-CM | POA: Diagnosis not present

## 2021-07-03 DIAGNOSIS — M25511 Pain in right shoulder: Secondary | ICD-10-CM | POA: Diagnosis not present

## 2021-07-03 DIAGNOSIS — M6281 Muscle weakness (generalized): Secondary | ICD-10-CM | POA: Diagnosis not present

## 2021-07-03 DIAGNOSIS — M25611 Stiffness of right shoulder, not elsewhere classified: Secondary | ICD-10-CM | POA: Diagnosis not present

## 2021-07-04 ENCOUNTER — Encounter: Payer: Self-pay | Admitting: Orthopedic Surgery

## 2021-07-04 NOTE — Progress Notes (Signed)
Office Visit Note   Patient: Eric Suarez           Date of Birth: 1946/04/17           MRN: [id number] Visit Date: 06/27/2021 Requested by: Deland Pretty, MD [address] [place],  [place] [address] PCP: Deland Pretty, MD  Subjective: Chief Complaint  Patient presents with   Right Shoulder - Follow-up    HPI: Willies is here for evaluation of right shoulder pain.  He has a SLAP tear plus significant tendinosis in the shoulder.  Pain does not wake him from sleep at night.  Backpack hurts his shoulder.  Would like to get another shot in the shoulder today because that has helped him in the past.  He has been going to therapy which she feels like is helped.  He wants to avoid surgery.              ROS: All systems reviewed are negative as they relate to the chief complaint within the history of present illness.  Patient denies  fevers or chills.   Assessment & Plan: Visit Diagnoses:  1. Tear of right supraspinatus tendon   2. Right shoulder pain, unspecified chronicity   3. Bursitis of right shoulder     Plan: Impression is right shoulder pain with improvement with prior intra-articular glenohumeral joint injection.  Has SLAP tear.  Wants avoid surgery.  Repeat spinal injection today into the shoulder and follow-up as needed.  Follow-Up Instructions: Return if symptoms worsen or fail to improve.   Orders:  Orders Placed This Encounter  Procedures   US Guided Needle Placement - No Linked Charges   No orders of the defined types were placed in this encounter.     Procedures: No procedures performed   Clinical Data: No additional findings.  Objective: Vital Signs: There were no vitals taken for this visit.  Physical Exam:   Constitutional: Patient appears well-developed HEENT:  Head: Normocephalic Eyes:EOM are normal Neck: Normal range of motion Cardiovascular: Normal rate Pulmonary/chest: Effort normal Neurologic: Patient is alert Skin: Skin  is warm Psychiatric: Patient has normal mood and affect   Ortho Exam: Ortho exam demonstrates positive O'Brien's test on the right.  Good cervical spine range of motion.  Fairly reasonable rotator cuff strength infraspinatus supraspinatus subscap muscle testing with no restriction of passive range of motion.  No other masses lymphadenopathy or skin changes noted in the shoulder girdle region.  Specialty Comments:  No specialty comments available.  Imaging: No results found.   PMFS History: Patient Active Problem List   Diagnosis Date Noted   S/P hernia repair 10/08/2017   Chronic pain of both knees [id number]   Folliculitis [id number]   SBO (small bowel obstruction) ([place]) 01/10/2017   Post-operative complication [id number]   Severe sepsis ([place]) 12/25/2016   Flu-like symptoms 12/25/2016   Dehydration with hyponatremia 12/25/2016   AKI (acute kidney injury) ([place]) 12/25/2016   CKD (chronic kidney disease) stage 3, GFR 30-59 ml/min ([place]) 12/25/2016   S/P repair of ventral hernia 12/25/2016   Hypothyroidism 12/25/2016   HTN (hypertension) 12/25/2016   Neoplasm of left kidney 04/18/2016   Postoperative examination 03/07/2016   Strangulated inguinal hernia 02/29/2016   Past Medical History:  Diagnosis Date   Anxiety    Arthritis    Cancer ([facility]) [id number]   thryoid, left kidney   Cataracts, bilateral    Complication of anesthesia    unable to void after surgery   Coronary artery  disease    pt denies- had stress>10 yrs ago due to"get winded walking uphill'- neg   GERD (gastroesophageal reflux disease)    History of hiatal hernia    History of kidney surgery    left kidney removed due to cancer 2017   Hypertension    Hypothyroidism    Left renal mass    Macular degeneration of both eyes    dry   PONV (postoperative nausea and vomiting)     History reviewed. No pertinent family history.  Past Surgical History:  Procedure Laterality Date   APPENDECTOMY     COLONOSCOPY      CYSTOSCOPY WITH RETROGRADE PYELOGRAM, URETEROSCOPY AND STENT PLACEMENT Left 04/01/2016   Procedure: CYSTOSCOPY WITH RETROGRADE PYELOGRAM, left ureter;  Surgeon: Raynelle Bring, MD;  [facility];  Service: Urology;  Laterality: Left;   HEMORRHOID SURGERY     HERNIA REPAIR     inguinal-3 on left , 1 on right   INCISIONAL HERNIA REPAIR N/A 12/23/2016   Procedure: LAPAROSCOPIC VERSUS OPEN LYSIS OF ADHESIONS AND INCISIONAL HERNIA REPAIR WITH MESH;  Surgeon: Ralene Ok, MD;  Location: [facility];  Service: General;  Laterality: N/A;   [place]  10/08/2017   OPEN   INCISIONAL HERNIA REPAIR N/A 10/08/2017   Procedure: [place];  Surgeon: Ralene Ok, MD;  Location: [facility];  Service: General;  Laterality: N/A;   INSERTION OF MESH N/A 12/23/2016   Procedure: INSERTION OF MESH;  Surgeon: Ralene Ok, MD;  Location: [facility];  Service: General;  Laterality: N/A;   INSERTION OF MESH N/A 01/10/2017   Procedure: INSERTION OF MESH;  Surgeon: Ralene Ok, MD;  Location: [facility];  Service: General;  Laterality: N/A;   INSERTION OF MESH N/A 10/08/2017   Procedure: INSERTION OF MESH;  Surgeon: Ralene Ok, MD;  Location: [facility];  Service: General;  Laterality: N/A;   LAPAROSCOPIC NEPHRECTOMY Left 04/18/2016   Procedure: LAPAROSCOPIC RADICAL  LEFT NEPHRECTOMY;  Surgeon: Raynelle Bring, MD;  [facility];  Service: Urology;  Laterality: Left;   LAPAROSCOPY N/A 01/10/2017   Procedure: LAPAROSCOPY DIAGNOSTIC , POSSIBLE LAPAROTOMY;  Surgeon: Ralene Ok, MD;  Location: [facility];  Service: General;  Laterality: N/A;   NASAL SINUS SURGERY     left side   NASAL SINUS SURGERY Left 10/25/2019   Procedure: ENDOSCOPIC SINUS SURGERY/ethmoid and maxillary;  Surgeon: Izora Gala, MD;  Location: [facility];  Service: ENT;  Laterality: Left;   THYROIDECTOMY, PARTIAL     left small amount on left side-for cancer   TONSILLECTOMY     Social History    Occupational History   Not on file  Tobacco Use   Smoking status: Never   Smokeless tobacco: Never  Vaping Use   Vaping Use: Never used  Substance and Sexual Activity   Alcohol use: No   Drug use: No   Sexual activity: Not on file

## 2021-07-12 DIAGNOSIS — R293 Abnormal posture: Secondary | ICD-10-CM | POA: Diagnosis not present

## 2021-07-12 DIAGNOSIS — M6281 Muscle weakness (generalized): Secondary | ICD-10-CM | POA: Diagnosis not present

## 2021-07-12 DIAGNOSIS — M25511 Pain in right shoulder: Secondary | ICD-10-CM | POA: Diagnosis not present

## 2021-07-12 DIAGNOSIS — M25611 Stiffness of right shoulder, not elsewhere classified: Secondary | ICD-10-CM | POA: Diagnosis not present

## 2021-09-11 DIAGNOSIS — N183 Chronic kidney disease, stage 3 unspecified: Secondary | ICD-10-CM

## 2021-09-11 HISTORY — DX: Chronic kidney disease, stage 3 unspecified: N18.30

## 2021-09-27 DIAGNOSIS — Z125 Encounter for screening for malignant neoplasm of prostate: Secondary | ICD-10-CM | POA: Diagnosis not present

## 2021-09-27 DIAGNOSIS — I10 Essential (primary) hypertension: Secondary | ICD-10-CM | POA: Diagnosis not present

## 2021-09-27 DIAGNOSIS — E039 Hypothyroidism, unspecified: Secondary | ICD-10-CM | POA: Diagnosis not present

## 2021-09-27 DIAGNOSIS — E7800 Pure hypercholesterolemia, unspecified: Secondary | ICD-10-CM | POA: Diagnosis not present

## 2021-09-27 DIAGNOSIS — E782 Mixed hyperlipidemia: Secondary | ICD-10-CM

## 2021-09-27 HISTORY — DX: Mixed hyperlipidemia: E78.2

## 2021-10-01 DIAGNOSIS — E7800 Pure hypercholesterolemia, unspecified: Secondary | ICD-10-CM | POA: Diagnosis not present

## 2021-10-01 DIAGNOSIS — Z0000 Encounter for general adult medical examination without abnormal findings: Secondary | ICD-10-CM | POA: Diagnosis not present

## 2021-10-01 DIAGNOSIS — K219 Gastro-esophageal reflux disease without esophagitis: Secondary | ICD-10-CM | POA: Diagnosis not present

## 2021-10-01 DIAGNOSIS — N400 Enlarged prostate without lower urinary tract symptoms: Secondary | ICD-10-CM | POA: Diagnosis not present

## 2021-10-01 DIAGNOSIS — E032 Hypothyroidism due to medicaments and other exogenous substances: Secondary | ICD-10-CM | POA: Diagnosis not present

## 2021-10-01 DIAGNOSIS — I10 Essential (primary) hypertension: Secondary | ICD-10-CM | POA: Diagnosis not present

## 2021-10-01 DIAGNOSIS — N1831 Chronic kidney disease, stage 3a: Secondary | ICD-10-CM | POA: Diagnosis not present

## 2021-10-01 DIAGNOSIS — F419 Anxiety disorder, unspecified: Secondary | ICD-10-CM | POA: Diagnosis not present

## 2021-10-08 ENCOUNTER — Other Ambulatory Visit: Payer: Self-pay | Admitting: Internal Medicine

## 2021-10-08 DIAGNOSIS — E7800 Pure hypercholesterolemia, unspecified: Secondary | ICD-10-CM

## 2021-10-10 ENCOUNTER — Ambulatory Visit: Payer: PPO | Admitting: Orthopedic Surgery

## 2021-10-31 ENCOUNTER — Ambulatory Visit
Admission: RE | Admit: 2021-10-31 | Discharge: 2021-10-31 | Disposition: A | Discharge status: Home / Self Care | Payer: No Typology Code available for payment source | Source: Ambulatory Visit | Attending: Internal Medicine | Admitting: Internal Medicine

## 2021-10-31 ENCOUNTER — Other Ambulatory Visit: Payer: Self-pay

## 2021-10-31 ENCOUNTER — Other Ambulatory Visit: Payer: PPO

## 2021-10-31 DIAGNOSIS — Z8249 Family history of ischemic heart disease and other diseases of the circulatory system: Secondary | ICD-10-CM | POA: Diagnosis not present

## 2021-10-31 DIAGNOSIS — E785 Hyperlipidemia, unspecified: Secondary | ICD-10-CM | POA: Diagnosis not present

## 2021-10-31 DIAGNOSIS — I2510 Atherosclerotic heart disease of native coronary artery without angina pectoris: Secondary | ICD-10-CM

## 2021-10-31 DIAGNOSIS — E7800 Pure hypercholesterolemia, unspecified: Secondary | ICD-10-CM

## 2021-10-31 HISTORY — DX: Atherosclerotic heart disease of native coronary artery without angina pectoris: I25.10

## 2021-11-14 ENCOUNTER — Ambulatory Visit: Payer: PPO | Admitting: Orthopedic Surgery

## 2021-11-16 IMAGING — DX DG HIP (WITH OR WITHOUT PELVIS) 2-3V*R*
2 series · 2 of 2 positions shown · non-contrast
Comparison: None

CLINICAL DATA: RIGHT hip pain for 2 months, no known injury

EXAM:
DG HIP (WITH OR WITHOUT PELVIS) 2-3V RIGHT

[hip ap]
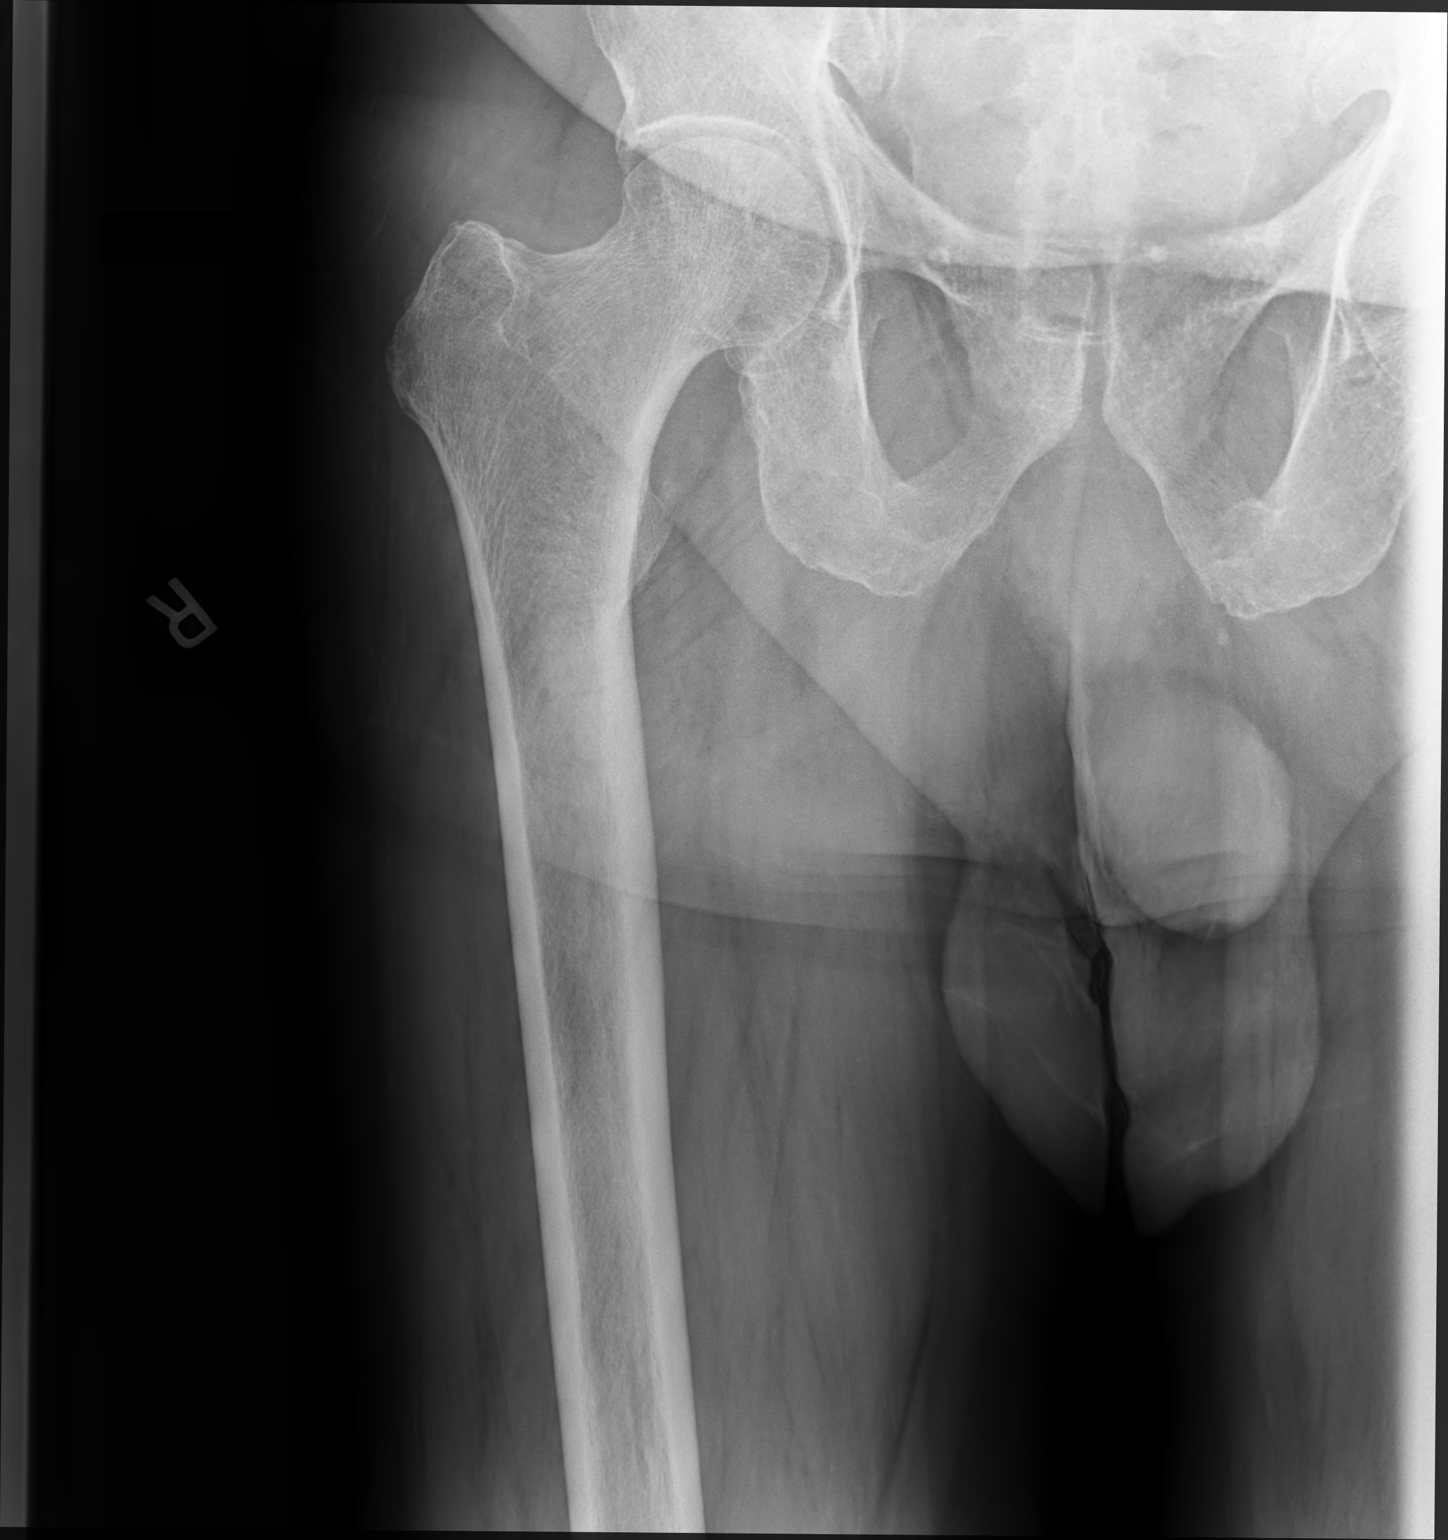

[hip (frog leg)]
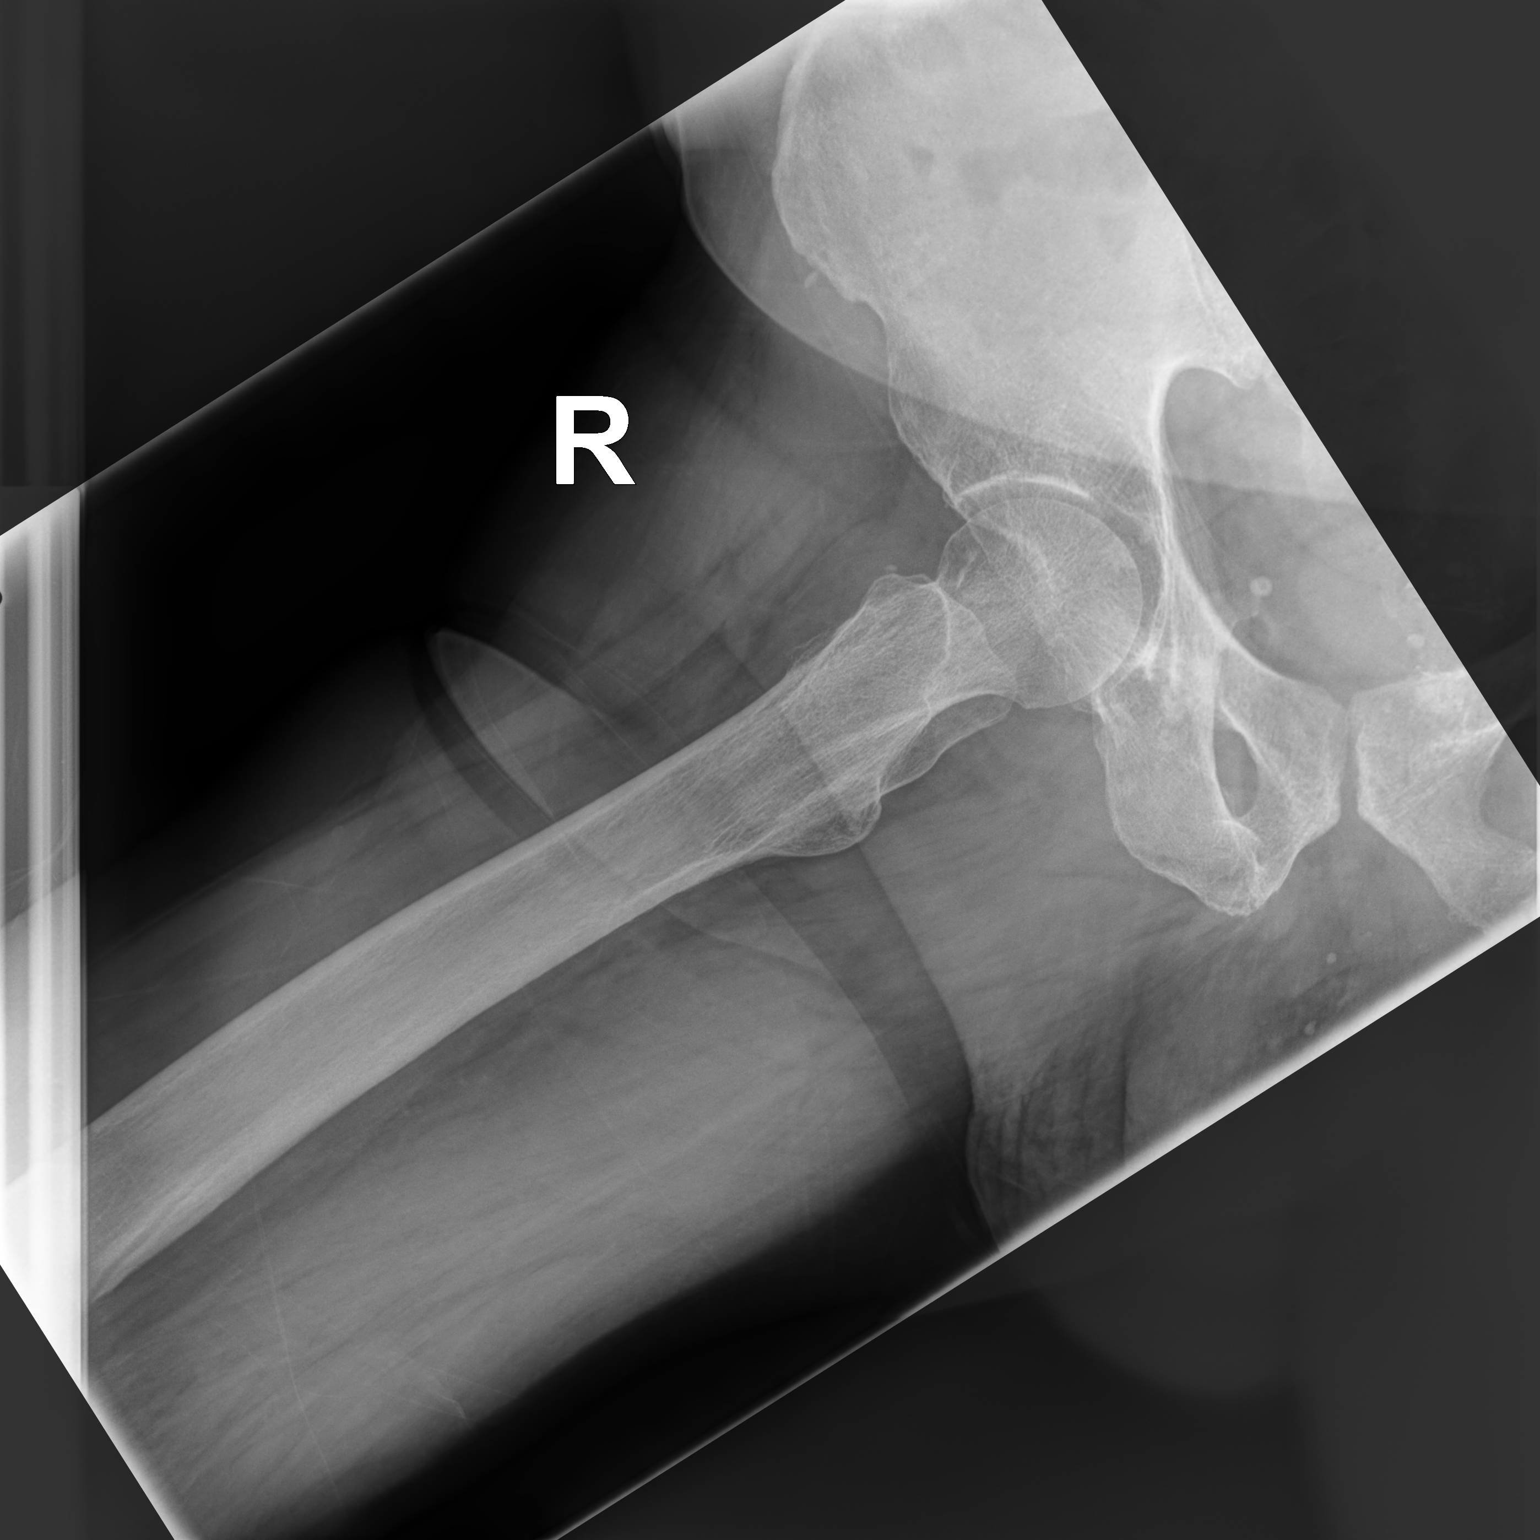

[2 of 2 positions shown; findings below may reference images not displayed]

FINDINGS: Osseous mineralization low normal.

Joint spaces preserved.

No acute fracture, dislocation, or bone destruction.
IMPRESSION: No acute osseous abnormalities.

## 2021-11-21 DIAGNOSIS — I2510 Atherosclerotic heart disease of native coronary artery without angina pectoris: Secondary | ICD-10-CM | POA: Diagnosis not present

## 2021-11-21 DIAGNOSIS — K580 Irritable bowel syndrome with diarrhea: Secondary | ICD-10-CM | POA: Diagnosis not present

## 2021-11-29 DIAGNOSIS — N401 Enlarged prostate with lower urinary tract symptoms: Secondary | ICD-10-CM | POA: Diagnosis not present

## 2021-12-05 DIAGNOSIS — I10 Essential (primary) hypertension: Secondary | ICD-10-CM | POA: Diagnosis not present

## 2021-12-05 DIAGNOSIS — M791 Myalgia, unspecified site: Secondary | ICD-10-CM | POA: Diagnosis not present

## 2021-12-05 DIAGNOSIS — E7800 Pure hypercholesterolemia, unspecified: Secondary | ICD-10-CM | POA: Diagnosis not present

## 2021-12-05 DIAGNOSIS — I2510 Atherosclerotic heart disease of native coronary artery without angina pectoris: Secondary | ICD-10-CM | POA: Diagnosis not present

## 2021-12-05 DIAGNOSIS — F419 Anxiety disorder, unspecified: Secondary | ICD-10-CM | POA: Diagnosis not present

## 2021-12-05 DIAGNOSIS — T466X5A Adverse effect of antihyperlipidemic and antiarteriosclerotic drugs, initial encounter: Secondary | ICD-10-CM | POA: Diagnosis not present

## 2021-12-06 DIAGNOSIS — N210 Calculus in bladder: Secondary | ICD-10-CM | POA: Diagnosis not present

## 2021-12-06 DIAGNOSIS — Z85528 Personal history of other malignant neoplasm of kidney: Secondary | ICD-10-CM | POA: Diagnosis not present

## 2021-12-06 DIAGNOSIS — N400 Enlarged prostate without lower urinary tract symptoms: Secondary | ICD-10-CM | POA: Diagnosis not present

## 2021-12-10 ENCOUNTER — Ambulatory Visit: Payer: PPO | Admitting: Cardiology

## 2021-12-10 ENCOUNTER — Encounter: Payer: Self-pay | Admitting: Cardiology

## 2021-12-10 ENCOUNTER — Other Ambulatory Visit: Payer: Self-pay

## 2021-12-10 VITALS — BP 124/78 | HR 65 | Ht 75.0 in | Wt 199.0 lb

## 2021-12-10 DIAGNOSIS — Z8249 Family history of ischemic heart disease and other diseases of the circulatory system: Secondary | ICD-10-CM | POA: Diagnosis not present

## 2021-12-10 DIAGNOSIS — R9431 Abnormal electrocardiogram [ECG] [EKG]: Secondary | ICD-10-CM

## 2021-12-10 DIAGNOSIS — I2510 Atherosclerotic heart disease of native coronary artery without angina pectoris: Secondary | ICD-10-CM | POA: Diagnosis not present

## 2021-12-10 DIAGNOSIS — R931 Abnormal findings on diagnostic imaging of heart and coronary circulation: Secondary | ICD-10-CM | POA: Diagnosis not present

## 2021-12-10 DIAGNOSIS — N1832 Chronic kidney disease, stage 3b: Secondary | ICD-10-CM

## 2021-12-10 DIAGNOSIS — R6889 Other general symptoms and signs: Secondary | ICD-10-CM | POA: Insufficient documentation

## 2021-12-10 DIAGNOSIS — E782 Mixed hyperlipidemia: Secondary | ICD-10-CM | POA: Insufficient documentation

## 2021-12-10 DIAGNOSIS — E785 Hyperlipidemia, unspecified: Secondary | ICD-10-CM | POA: Insufficient documentation

## 2021-12-10 MED ORDER — METOPROLOL TARTRATE 50 MG PO TABS: 50.0000 mg | ORAL_TABLET | Freq: Once | ORAL | 0 refills | Status: DC

## 2021-12-10 NOTE — Progress Notes (Signed)
Primary Care Provider: Deland Pretty, MD [facility] Cardiologist: Glenetta Hew, MD Electrophysiologist: None  Clinic Note: Chief Complaint  Patient presents with   New Patient (Initial Visit)   Coronary Artery Disease    Elevated Coronary Calcium Score-535 with left main   Shortness of Breath    Some exertional shortness of breath/exercise intolerance   ===================================  ASSESSMENT/PLAN   Problem List Items Addressed This Visit       Cardiology Problems   [place] coronary artery calcium score greater than 400    Overall Coronary Calcium Score is 535 mostly in the left system left main, LAD and LCx disease noted.  He reconsidered his symptoms that he discussed with Dr. Shelia Media.  He has had some change in exercise tolerance with some exertional dyspnea.  Need to exclude this is possible atypical anginal type symptoms versus just simply deconditioning.  Plan: Coronary CT angiogram with possible CT FFR. Agree with aspirin 81 mg-therapy.  Okay to use coated aspirin for maintenance, but not for acute episodes of chest pain.  In that case would use noncoated aspirin. Continue to try to treat lipids, consider Zetia plus or minus Vascepa in follow-up. Continue beta-blocker      Relevant Orders   EKG 12-Lead   CT CORONARY MORPH W/CTA COR W/SCORE W/CA W/CM &/OR WO/CM   Basic metabolic panel   Left main coronary artery disease - Primary (Chronic)    Coronary Calcium Score of 535 without significant symptoms or probably not necessarily warrant evaluation, but based on the fact that the majority of the calcium is noted in the left main as well as LAD and LCx, I think it is reasonable to further evaluate for ischemic left main disease.  He does have some exertional dyspnea and occasional albeit atypical sounding symptoms for chest pain/angina.  Plan: Coronary CT angiogram with possible FFRCT to exclude significant left main disease. Lipid panel actually is not  all that bad, but with elevated Coronary Calcium Score, still need to stratify.  Consider Vascepa for triglycerides which would also potentially help improve HDL. Continue to recommend exercise once we are able to confirm no ischemic disease. Blood pressure is excellent pretty well controlled and he is on a beta-blocker already.      Relevant Orders   EKG 12-Lead   CT CORONARY MORPH W/CTA COR W/SCORE W/CA W/CM &/OR WO/CM   Basic metabolic panel     Other   Family history of premature coronary artery disease: Father & Brother (Chronic)   Relevant Orders   EKG 12-Lead   CT CORONARY MORPH W/CTA COR W/SCORE W/CA W/CM &/OR WO/CM   Basic metabolic panel   Nonspecific abnormal electrocardiogram (ECG) (EKG) (Chronic)    Borderline findings on EKG with left anterior fascicular block that could be suggestive of possible ischemia.  Given the fact the corticotomies noted in the left main as well as LAD and LCx proximally, need to exclude significant left main disease.  Checking Coronary CTA      Relevant Orders   EKG 12-Lead   CT CORONARY MORPH W/CTA COR W/SCORE W/CA W/CM &/OR WO/CM   Basic metabolic panel   Exercise intolerance - CRO Atypical Angina (Chronic)    Difficult to tell if this is simply deconditioning versus true exercise intolerance.  However given that his Coronary Calcium Score shows significant calcification of the left main, need to exclude ischemic CAD.      Relevant Orders   EKG 12-Lead   CT CORONARY MORPH W/CTA  COR W/SCORE W/CA W/CM &/OR WO/CM   Basic metabolic panel   CKD (chronic kidney disease) stage 3, GFR 30-59 ml/min (HCC)    We will need to make sure that he hydrates adequately prior to having his Coronary CTA as well as 1 to 2 days after.. Will probably recheck about a week after to ensure no worsening function.      Mixed dyslipidemia    Triglyceride levels are borderline elevated and HDL is low but LDL was actually well controlled. ->  However, he does have  elevated Coronary Calcium Score and would recommend intensive more aggressive therapy which would include trying to increase HDL and lower triglycerides.  Could consider Zetia and follow-up as well as possibly Vascepa.      Relevant Orders   EKG 12-Lead   CT CORONARY MORPH W/CTA COR W/SCORE W/CA W/CM &/OR WO/CM   Basic metabolic panel   Other Visit Diagnoses     Abnormal electrocardiogram       Relevant Orders   EKG 12-Lead   CT CORONARY MORPH W/CTA COR W/SCORE W/CA W/CM &/OR WO/CM   Basic metabolic panel      ===================================  HPI:    Eric Suarez is a 76 y.o. male with Strong Family History of CAD, Dyslipidemia (low HDL, high TG), HTN who is being seen today for the evaluation of ELEVATED CORONARY CALCIUM SCORE at the request of Deland Pretty, MD.  Eric Suarez was just seen on November 21 2021 by Dr. Shelia Media in follow-up after Coronary Calcium Score evaluation.  With low LDL and high triglycerides, Dr. Shelia Media chose to evaluate for restratification with a Coronary Calcium Score.  At the time of evaluation, he really did not notice any significant episode of chest pain or necessary worsening exertional dyspnea or exercise tolerance.  Corta-Cap score was elevated and therefore referred for cardiology evaluation  Recent Hospitalizations: None  Reviewed  CV studies:    The following studies were reviewed today: (if available, images/films reviewed: From Epic Chart or Care Everywhere) Coronary Calcium Score 10/31/2021: Total score 535, left main.  306, LAD 120, LCx 29, RCA 0  Interval History:   Eric Suarez presents today for cardiology evaluation initially stating that he really does not have any notable symptoms of chest pressure or tightness.  No resting dyspnea, but does have some mild exercise intolerance with exertional dyspnea he had not really considered until after his last visit with Dr. Shelia Media. ->  He has definitely curtailed his exercise level.   Not really doing as much walking, and doing less of the yard work / lawn-mowing over the last year or so.  He had just attributed this to being more sedentary in the setting of COVID, and being limited by back pain, but has noted some dyspnea.  He has not had any exertional chest pain, but has had some intermittent pressure or tightness with or without exertion.Marland Kitchen He compares his current level exertion to years ago when he used to play significant mount of basketball and walk several miles a day.  Not able to do that.  I contributed to his age and back pain, but now not as certain.  He has been working with Dr. Pennie Banter pharmacist to try to treat his lipids.  He has tried several statins including rosuvastatin and atorvastatin both of which were not tolerated-indicating cramping and muscle aches with both along with loose stools on atorvastatin.  Next potential consideration would be either another statin or Zetia.  CV Review of Symptoms (Summary) Cardiovascular ROS: positive for - chest pain, dyspnea on exertion, and mostly just an exercise intolerance, but "nothing significant" negative for - edema, irregular heartbeat, orthopnea, palpitations, paroxysmal nocturnal dyspnea, rapid heart rate, shortness of breath, or lightheadedness, dizziness or wooziness, syncope/near syncope or TIA/amaurosis fugax, claudication  REVIEWED OF SYSTEMS   Review of Systems  Constitutional:  Negative for malaise/fatigue (Does have some mild exertional intolerance) and weight loss.  HENT:  Negative for congestion and nosebleeds.   Respiratory:  Negative for cough and shortness of breath (Per HPI, some exertional dyspnea).   Cardiovascular:        Per HPI  Gastrointestinal:  Negative for blood in stool and melena.  Genitourinary:  Negative for dysuria, frequency and hematuria.  Musculoskeletal:  Positive for back pain. Negative for falls, joint pain and myalgias (No longer present, since he is no longer on statin).   Neurological:  Negative for dizziness, focal weakness and headaches.  Psychiatric/Behavioral: Negative.     I have reviewed and (if needed) personally updated the patient's problem list, medications, allergies, past medical and surgical history, social and family history.   PAST MEDICAL HISTORY   Past Medical History:  Diagnosis Date   Anxiety    Arthritis    BPH (benign prostatic hyperplasia)    Cataracts, bilateral    Chronic back pain    Followed by Dr. Arnoldo Morale   CKD (chronic kidney disease) stage 3, GFR 30-59 ml/min (HCC) 09/2021   Creatinine 7.49   Complication of anesthesia    unable to void after surgery   Coronary artery disease 10/31/2021   Coronary Calcium Score 535   GERD (gastroesophageal reflux disease)    History of hiatal hernia    History of kidney surgery    left kidney removed due to cancer 2017   History of thyroid cancer 1998   Thyroidectomy-now on Synthroid   Hypertension    Hypothyroidism (acquired) 1998   Post thyroidectomy for thyroid cancer   Macular degeneration of both eyes    dry   Mixed dyslipidemia 09/27/2021   Low HDL, high triglycerides with normal LDL: TC 117, TG 135, HDL 25, LDL 56.   PONV (postoperative nausea and vomiting)    Renal cell carcinoma, left ([place]) 2017   Left nephrectomy-Dr. Sherilyn Banker, complicated by multiple different hernia surgeries.    PAST SURGICAL HISTORY   Past Surgical History:  Procedure Laterality Date   APPENDECTOMY     COLONOSCOPY     CYSTOSCOPY WITH RETROGRADE PYELOGRAM, URETEROSCOPY AND STENT PLACEMENT Left 04/01/2016   Procedure: CYSTOSCOPY WITH RETROGRADE PYELOGRAM, left ureter;  Surgeon: Raynelle Bring, MD;  Location: [facility];  Service: Urology;  Laterality: Left;   HEMORRHOID SURGERY     HERNIA REPAIR     inguinal-3 on left , 1 on right   INCISIONAL HERNIA REPAIR N/A 12/23/2016   Procedure: LAPAROSCOPIC VERSUS OPEN LYSIS OF ADHESIONS AND INCISIONAL HERNIA REPAIR WITH MESH;  Surgeon: Ralene Ok, MD;   Location: [place];  Service: General;  Laterality: N/A;   [place]  10/08/2017   OPEN   INCISIONAL HERNIA REPAIR N/A 10/08/2017   Procedure: [place];  Surgeon: Ralene Ok, MD;  Location: [place];  Service: General;  Laterality: N/A;   INSERTION OF MESH N/A 12/23/2016   Procedure: INSERTION OF MESH;  Surgeon: Ralene Ok, MD;  Location: [place];  Service: General;  Laterality: N/A;   INSERTION OF MESH N/A 01/10/2017   Procedure:  INSERTION OF MESH;  Surgeon: Ralene Ok, MD;  Location: [place];  Service: General;  Laterality: N/A;   INSERTION OF MESH N/A 10/08/2017   Procedure: INSERTION OF MESH;  Surgeon: Ralene Ok, MD;  Location: [place];  Service: General;  Laterality: N/A;   LAPAROSCOPIC NEPHRECTOMY Left 04/18/2016   Procedure: LAPAROSCOPIC RADICAL  LEFT NEPHRECTOMY;  Surgeon: Raynelle Bring, MD;  Location: [facility];  Service: Urology;  Laterality: Left;   LAPAROSCOPY N/A 01/10/2017   Procedure: LAPAROSCOPY DIAGNOSTIC , POSSIBLE LAPAROTOMY;  Surgeon: Ralene Ok, MD;  Location: [place];  Service: General;  Laterality: N/A;   NASAL SINUS SURGERY     left side   NASAL SINUS SURGERY Left 10/25/2019   Procedure: ENDOSCOPIC SINUS SURGERY/ethmoid and maxillary;  Surgeon: Izora Gala, MD;  Location: [place];  Service: ENT;  Laterality: Left;   THYROIDECTOMY, PARTIAL  1998   left small amount on left side-for cancer   TONSILLECTOMY       There is no immunization history on file for this patient.  MEDICATIONS/ALLERGIES   Current Meds  Medication Sig   alfuzosin (UROXATRAL) 10 MG 24 hr tablet Take 10 mg daily by mouth.    atenolol-chlorthalidone (TENORETIC) 50-25 MG tablet Take 1 tablet by mouth daily. Patient only takes 1/2 tablet   dutasteride (AVODART) 0.5 MG capsule Take 0.5 mg daily by mouth.   famotidine (PEPCID) 20 MG tablet Take 20 mg by mouth 2 (two) times daily.   levothyroxine (SYNTHROID) 125 MCG  tablet Take 125 mcg by mouth daily before breakfast.   metoprolol tartrate (LOPRESSOR) 50 MG tablet Take 1 tablet (50 mg total) by mouth once for 1 dose. TAKE TWO HOURS PRIOR TO  SCHEDULE CARDIAC TEST   Multiple Vitamins-Minerals (EYE VITAMINS PO) Take by mouth.   omeprazole (PRILOSEC) 20 MG capsule Take 20 mg by mouth daily.   OVER THE COUNTER MEDICATION Take 2 tablets daily by mouth. Cognium - Brain Vitamin   PARoxetine (PAXIL-CR) 25 MG 24 hr tablet Take 25 mg by mouth daily.   potassium chloride SA (K-DUR,KLOR-CON) 20 MEQ tablet Take 20 mEq by mouth daily.   vitamin C (ASCORBIC ACID) 500 MG tablet Take 500 mg by mouth daily.   Zinc Sulfate (ZINC 15 PO) Take by mouth.   [DISCONTINUED] levothyroxine (SYNTHROID) 150 MCG tablet Take 1 tablet (150 mcg total) by mouth daily before breakfast. (Patient taking differently: Take 125 mcg by mouth daily before breakfast.)    Allergies  Allergen Reactions   Tamsulosin Nausea And Vomiting and Other (See Comments)    Uncomfortable in pelvic region   Sulfa Antibiotics Rash    SOCIAL HISTORY/FAMILY HISTORY   Reviewed in Epic:   Social History   Tobacco Use   Smoking status: Never   Smokeless tobacco: Never  Vaping Use   Vaping Use: Never used  Substance Use Topics   Alcohol use: No   Drug use: No   Social History   Social History Narrative   Married father 1 son who lives in [place].  He lives with his wife.      Drink 2 cups of coffee a day.      Previously used to exercise quite a bit with significant daily walking and mowing multiple lawns.  (His house, along with several neighbors and the church lawn) now he does still do some yard work and occasional walking.  But since the onset of COVID, has slowly declined in and out of exercise.   Family  History  Problem Relation Age of Onset   Dementia Mother    Coronary artery disease Father 78       Longstanding smoker   Heart attack Father 25       Second MI was at 42    Hypertension Father    Hyperlipidemia Father    Heart attack Brother 36       Sudden cardiac death from MI   Healthy Son     OBJCTIVE -PE, EKG, labs   Wt Readings from Last 3 Encounters:  12/10/21 199 lb (90.3 kg)  03/28/20 206 lb (93.4 kg)  01/25/20 204 lb (92.5 kg)    Physical Exam: BP 124/78 (BP Location: Right Arm, Patient Position: Sitting, Cuff Size: Normal)    Pulse 65    Ht 6\' 3"  (1.905 m)    Wt 199 lb (90.3 kg)    BMI 24.87 kg/m  Physical Exam Vitals reviewed.  Constitutional:      General: He is not in acute distress.    Appearance: He is well-developed and normal weight. He is not ill-appearing or toxic-appearing.  HENT:     Head: Normocephalic and atraumatic.  Neck:     Vascular: No carotid bruit.  Cardiovascular:     Rate and Rhythm: Normal rate and regular rhythm. No extrasystoles are present.    Chest Wall: PMI is not displaced.     Pulses: Normal pulses.     Heart sounds: Normal heart sounds, S1 normal and S2 normal. No murmur heard.   No friction rub. No gallop.  Pulmonary:     Effort: Pulmonary effort is normal. No respiratory distress.     Breath sounds: Normal breath sounds. No wheezing, rhonchi or rales.  Chest:     Chest wall: No tenderness.  Abdominal:     General: Abdomen is flat. Bowel sounds are normal. There is no distension.     Palpations: Abdomen is soft. There is no mass.     Tenderness: There is no abdominal tenderness.     Comments: No HSM or bruit  Musculoskeletal:        General: No swelling. Normal range of motion.     Cervical back: Normal range of motion and neck supple.  Skin:    General: Skin is warm and dry.  Neurological:     General: No focal deficit present.     Mental Status: He is alert and oriented to person, place, and time.     Gait: Gait normal.  Psychiatric:        Mood and Affect: Mood normal.        Behavior: Behavior normal.        Thought Content: Thought content normal.        Judgment: Judgment normal.      Adult ECG Report  Rate: 55;  Rhythm: normal sinus rhythm and LAFB, borderline tolerated better ;   Narrative Interpretation: Borderline  Recent Labs: 09/28/2021-reviewed Na+ 136, K+ 4.3, Cl- 100, HCO3-26, BUN 23, Cr 1.47, Glu 114, Ca2+ 8.6; AST 21, ALT 28, AlkP 93; T bili 1.3, T protein 7.1, Alb 4.2 CBC: W 6.8, H/H 14.3/42.3, Plt 178 TC 117, TG 175, HDL 26, LDL 56; TSH 2.65   ==================================================  COVID-19 Education: The signs and symptoms of COVID-19 were discussed with the patient and how to seek care for testing (follow up with PCP or arrange E-visit).    I spent a total of 27 minutes with the patient spent in direct patient consultation.  Additional time spent with chart review  / charting (studies, outside notes, etc): 28 min Total Time: 56 min  Current medicines are reviewed at length with the patient today.  (+/- concerns) none  This visit occurred during the SARS-CoV-2 public health emergency.  Safety protocols were in place, including screening questions prior to the visit, additional usage of staff PPE, and extensive cleaning of exam room while observing appropriate contact time as indicated for disinfecting solutions.  Notice: This dictation was prepared with Dragon dictation along with smart phrase technology. Any transcriptional errors that result from this process are unintentional and may not be corrected upon review.   Studies Ordered:  Orders Placed This Encounter  Procedures   CT CORONARY MORPH W/CTA COR W/SCORE W/CA W/CM &/OR WO/CM   Basic metabolic panel   EKG 09-TOIZ    Patient Instructions / Medication Changes & Studies & Tests Ordered   Patient Instructions  Medication Instructions:    METOPROLOL TARTRATE 50 MG - ONCE - SEE BELOW INSTRUCTIONS  *If you need a refill on your cardiac medications before your next appointment, please call your pharmacy*   Lab Work: BMP  If you have labs (blood work) drawn today and  your tests are completely normal, you will receive your results only by: MyChart Message (if you have MyChart) OR A paper copy in the mail If you have any lab test that is abnormal or we need to change your treatment, we will call you to review the results.   Testing/Procedures:  Will be schedule at [place]. Your physician has requested that you have cardiac CTA. Cardiac computed tomography (CT) is a painless test that uses an x-ray machine to take clear, detailed pictures of your heart.  Please follow instruction sheet as given.     Follow-Up: At [facility], you and your health needs are our priority.  As part of our continuing mission to provide you with exceptional heart care, we have created designated Provider Care Teams.  These Care Teams include your primary Cardiologist (physician) and Advanced Practice Providers (APPs -  Physician Assistants and Nurse Practitioners) who all work together to provide you with the care you need, when you need it.  We recommend signing up for the patient portal called "MyChart".  Sign up information is provided on this After Visit Summary.  MyChart is used to connect with patients for Virtual Visits (Telemedicine).  Patients are able to view lab/test results, encounter notes, upcoming appointments, etc.  Non-urgent messages can be sent to your provider as well.   To learn more about what you can do with MyChart, go to [email or web address].    Your next appointment:   3 month(s)  The format for your next appointment:   In Person  Provider:   Glenetta Hew, MD     Other Instructions   Your cardiac CT will be scheduled at the below location:   [facility] [address] [place], [place] [address] [phone]     Glenetta Hew, M.D., M.S. Interventional Cardiologist   Pager # [phone] Phone # [phone] [address] [place], [place] [address]   Thank you for  choosing Heartcare at [facility]!!

## 2021-12-10 NOTE — Patient Instructions (Signed)
Medication Instructions:    METOPROLOL TARTRATE 50 MG - ONCE - SEE BELOW INSTRUCTIONS  *If you need a refill on your cardiac medications before your next appointment, please call your pharmacy*   Lab Work: BMP  If you have labs (blood work) drawn today and your tests are completely normal, you will receive your results only by: [place] (if you have MyChart) OR A paper copy in the mail If you have any lab test that is abnormal or we need to change your treatment, we will call you to review the results.   Testing/Procedures:  Will be schedule at [place]. Your physician has requested that you have cardiac CTA. Cardiac computed tomography (CT) is a painless test that uses an x-ray machine to take clear, detailed pictures of your heart.  Please follow instruction sheet as given.     Follow-Up: At [facility], you and your health needs are our priority.  As part of our continuing mission to provide you with exceptional heart care, we have created designated Provider Care Teams.  These Care Teams include your primary Cardiologist (physician) and Advanced Practice Providers (APPs -  Physician Assistants and Nurse Practitioners) who all work together to provide you with the care you need, when you need it.  We recommend signing up for the patient portal called "MyChart".  Sign up information is provided on this After Visit Summary.  MyChart is used to connect with patients for Virtual Visits (Telemedicine).  Patients are able to view lab/test results, encounter notes, upcoming appointments, etc.  Non-urgent messages can be sent to your provider as well.   To learn more about what you can do with MyChart, go to [email or web address].    Your next appointment:   3 month(s)  The format for your next appointment:   In Person  Provider:   Glenetta Hew, MD     Other Instructions   Your cardiac CT will be scheduled at the below location:   [facility] [address] [place], [place] [address] [phone]    Please arrive at the [facility] main entrance (entrance A) of [facility] 30 minutes prior to test start time. You can use the FREE valet parking offered at the main entrance (encouraged to control the heart rate for the test) Proceed to the [facility] Radiology Department (first floor) to check-in and test prep.   Please follow these instructions carefully (unless otherwise directed):  Lab will be done today  BMP  On the Night Before the Test: Be sure to Drink plenty of water. Do not consume any caffeinated/decaffeinated beverages or chocolate 12 hours prior to your test. Do not take any antihistamines 12 hours prior to your test.   On the Day of the Test: Drink plenty of water until 1 hour prior to the test. Do not eat any food 4 hours prior to the test. You may take your regular medications prior to the test.  Take metoprolol (Lopressor)  50 mg two hours prior to test. HOLD atenolol- chlorthalidone  morning of the test.      After the Test: Drink plenty of water. After receiving IV contrast, you may experience a mild flushed feeling. This is normal. On occasion, you may experience a mild rash up to 24 hours after the test. This is not dangerous. If this occurs, you can take Benadryl 25 mg and increase your fluid intake. If you experience trouble breathing, this can be  serious. If it is severe call 911 IMMEDIATELY. If it is mild, please call our office. .  We will call to schedule your test 2-4 weeks out understanding that some insurance companies will need an authorization prior to the service being performed.   For non-scheduling related questions, please contact the cardiac imaging nurse navigator should you have any questions/concerns: Marchia Bond, Cardiac Imaging Nurse Navigator Gordy Clement, Cardiac Imaging Nurse Navigator [facility] Direct Office  Dial: [phone]   For scheduling needs, including cancellations and rescheduling, please call [place], [phone].

## 2021-12-11 ENCOUNTER — Encounter: Payer: Self-pay | Admitting: Cardiology

## 2021-12-11 NOTE — Assessment & Plan Note (Signed)
We will need to make sure that he hydrates adequately prior to having his Coronary CTA as well as 1 to 2 days after.. Will probably recheck about a week after to ensure no worsening function.

## 2021-12-11 NOTE — Assessment & Plan Note (Signed)
Triglyceride levels are borderline elevated and HDL is low but LDL was actually well controlled. ->  However, he does have elevated Coronary Calcium Score and would recommend intensive more aggressive therapy which would include trying to increase HDL and lower triglycerides.  Could consider Zetia and follow-up as well as possibly Vascepa.

## 2021-12-11 NOTE — Assessment & Plan Note (Signed)
Borderline findings on EKG with left anterior fascicular block that could be suggestive of possible ischemia.  Given the fact the corticotomies noted in the left main as well as LAD and LCx proximally, need to exclude significant left main disease.  Checking Coronary CTA

## 2021-12-11 NOTE — Assessment & Plan Note (Signed)
Difficult to tell if this is simply deconditioning versus true exercise intolerance.  However given that his Coronary Calcium Score shows significant calcification of the left main, need to exclude ischemic CAD.

## 2021-12-11 NOTE — Assessment & Plan Note (Addendum)
Overall Coronary Calcium Score is 535 mostly in the left system left main, LAD and LCx disease noted.  He reconsidered his symptoms that he discussed with Dr. Shelia Media.  He has had some change in exercise tolerance with some exertional dyspnea.  Need to exclude this is possible atypical anginal type symptoms versus just simply deconditioning.  Plan: Coronary CT angiogram with possible CT FFR.  Agree with aspirin 81 mg-therapy.  Okay to use coated aspirin for maintenance, but not for acute episodes of chest pain.  In that case would use noncoated aspirin.  Continue to try to treat lipids, consider Zetia plus or minus Vascepa in follow-up.  Continue beta-blocker

## 2021-12-11 NOTE — Assessment & Plan Note (Signed)
Coronary Calcium Score of 535 without significant symptoms or probably not necessarily warrant evaluation, but based on the fact that the majority of the calcium is noted in the left main as well as LAD and LCx, I think it is reasonable to further evaluate for ischemic left main disease.  He does have some exertional dyspnea and occasional albeit atypical sounding symptoms for chest pain/angina.  Plan:  Coronary CT angiogram with possible FFRCT to exclude significant left main disease.  Lipid panel actually is not all that bad, but with elevated Coronary Calcium Score, still need to stratify.  Consider Vascepa for triglycerides which would also potentially help improve HDL.  Continue to recommend exercise once we are able to confirm no ischemic disease.  Blood pressure is excellent pretty well controlled and he is on a beta-blocker already.

## 2021-12-13 ENCOUNTER — Ambulatory Visit: Payer: PPO | Admitting: Orthopedic Surgery

## 2021-12-13 DIAGNOSIS — R6889 Other general symptoms and signs: Secondary | ICD-10-CM | POA: Diagnosis not present

## 2021-12-13 DIAGNOSIS — R9431 Abnormal electrocardiogram [ECG] [EKG]: Secondary | ICD-10-CM | POA: Diagnosis not present

## 2021-12-13 DIAGNOSIS — E782 Mixed hyperlipidemia: Secondary | ICD-10-CM | POA: Diagnosis not present

## 2021-12-13 DIAGNOSIS — R931 Abnormal findings on diagnostic imaging of heart and coronary circulation: Secondary | ICD-10-CM | POA: Diagnosis not present

## 2021-12-13 DIAGNOSIS — Z8249 Family history of ischemic heart disease and other diseases of the circulatory system: Secondary | ICD-10-CM | POA: Diagnosis not present

## 2021-12-13 DIAGNOSIS — I2510 Atherosclerotic heart disease of native coronary artery without angina pectoris: Secondary | ICD-10-CM | POA: Diagnosis not present

## 2021-12-13 LAB — BASIC METABOLIC PANEL
BUN/Creatinine Ratio: 18 (ref 10–24)
BUN: 27 mg/dL (ref 8–27)
CO2: 19 mmol/L — ABNORMAL LOW (ref 20–29)
Calcium: 9.1 mg/dL (ref 8.6–10.2)
Chloride: 104 mmol/L (ref 96–106)
Creatinine, Ser: 1.47 mg/dL — ABNORMAL HIGH (ref 0.76–1.27)
Glucose: 99 mg/dL (ref 70–99)
Potassium: 4 mmol/L (ref 3.5–5.2)
Sodium: 138 mmol/L (ref 134–144)
eGFR: 49 mL/min/{1.73_m2} — ABNORMAL LOW (ref >59)

## 2021-12-24 ENCOUNTER — Telehealth (HOSPITAL_COMMUNITY): Payer: Self-pay | Admitting: *Deleted

## 2021-12-24 NOTE — Telephone Encounter (Signed)
Reaching out to patient to offer assistance regarding upcoming cardiac imaging study; pt verbalizes understanding of appt date/time, parking situation and where to check in, pre-test NPO status and medications ordered, and verified current allergies; name and call back number provided for further questions should they arise  Eric Clement RN Navigator Cardiac Imaging [facility] [phone] office [phone] cell  Patient to hold his tenoretic and will take 50mg  metoprolol tartrate two hours prior to his cardiac CT scan. He is aware to arrive at 10:30am for his 11am scan.

## 2021-12-26 ENCOUNTER — Ambulatory Visit (HOSPITAL_COMMUNITY)
Admission: RE | Admit: 2021-12-26 | Discharge: 2021-12-26 | Disposition: A | Discharge status: Home / Self Care | Payer: PPO | Source: Ambulatory Visit | Attending: Cardiology | Admitting: Cardiology

## 2021-12-26 ENCOUNTER — Other Ambulatory Visit: Payer: Self-pay

## 2021-12-26 DIAGNOSIS — R931 Abnormal findings on diagnostic imaging of heart and coronary circulation: Secondary | ICD-10-CM | POA: Diagnosis not present

## 2021-12-26 DIAGNOSIS — E782 Mixed hyperlipidemia: Secondary | ICD-10-CM

## 2021-12-26 DIAGNOSIS — I2510 Atherosclerotic heart disease of native coronary artery without angina pectoris: Secondary | ICD-10-CM | POA: Diagnosis not present

## 2021-12-26 DIAGNOSIS — Z8249 Family history of ischemic heart disease and other diseases of the circulatory system: Secondary | ICD-10-CM | POA: Diagnosis not present

## 2021-12-26 DIAGNOSIS — R6889 Other general symptoms and signs: Secondary | ICD-10-CM

## 2021-12-26 DIAGNOSIS — R9431 Abnormal electrocardiogram [ECG] [EKG]: Secondary | ICD-10-CM

## 2021-12-26 MED ORDER — NITROGLYCERIN 0.4 MG SL SUBL
0.8000 mg | SUBLINGUAL_TABLET | Freq: Once | SUBLINGUAL | Status: AC
  Administered 2021-12-26: 0.8 mg via SUBLINGUAL

## 2021-12-26 MED ORDER — NITROGLYCERIN 0.4 MG SL SUBL
SUBLINGUAL_TABLET | SUBLINGUAL | Status: AC
  Filled 2021-12-26: qty 2

## 2021-12-26 MED ORDER — IOHEXOL 350 MG/ML SOLN
100.0000 mL | Freq: Once | INTRAVENOUS | Status: AC | PRN
  Administered 2021-12-26: 100 mL via INTRAVENOUS

## 2021-12-27 ENCOUNTER — Encounter: Payer: Self-pay | Admitting: Cardiology

## 2021-12-27 DIAGNOSIS — I272 Pulmonary hypertension, unspecified: Secondary | ICD-10-CM | POA: Insufficient documentation

## 2021-12-27 NOTE — Progress Notes (Signed)
Coronary CT angiogram results:  As reviewed, elevated Coronary Calcium Score 536.  For the most part there is mild to moderate disease atherosclerotic disease in the left coronary system with 25 to 49% narrowing noted in the left main (older concerned about if possibly greater than 50%).  The rest of the LAD and left circumflex have less than 25% with exception of a small branch having 25 to 50%.  Study confirms that indeed we need to be taking the coronary disease seriously, but no need for invasive evaluation cardiac catheterization.  Recently need to be aggressive with risk factor modification.  The study does suggest some dilation of the pulmonary artery and recommends echocardiogram.  We should go ahead and check an echocardiogram hopefully prior to follow-up visit to confirm or deny this finding.   Glenetta Hew, MD  Ivin Booty - can we go ahead & order 2d Echo - Dx Pulmonary Hypertension

## 2021-12-28 ENCOUNTER — Telehealth: Payer: Self-pay | Admitting: *Deleted

## 2021-12-28 DIAGNOSIS — Z8249 Family history of ischemic heart disease and other diseases of the circulatory system: Secondary | ICD-10-CM

## 2021-12-28 DIAGNOSIS — I2510 Atherosclerotic heart disease of native coronary artery without angina pectoris: Secondary | ICD-10-CM

## 2021-12-28 DIAGNOSIS — R931 Abnormal findings on diagnostic imaging of heart and coronary circulation: Secondary | ICD-10-CM

## 2021-12-28 NOTE — Telephone Encounter (Signed)
-----   Message from Leonie Man, MD sent at 12/27/2021 11:29 PM EST ----- Coronary CT angiogram results:  As reviewed, elevated Coronary Calcium Score 536.  For the most part there is mild to moderate disease atherosclerotic disease in the left coronary system with 25 to 49% narrowing noted in the left main (older concerned about if possibly greater than 50%).  The rest of the LAD and left circumflex have less than 25% with exception of a small branch having 25 to 50%.  Study confirms that indeed we need to be taking the coronary disease seriously, but no need for invasive evaluation cardiac catheterization.  Recently need to be aggressive with risk factor modification.  The study does suggest some dilation of the pulmonary artery and recommends echocardiogram.  We should go ahead and check an echocardiogram hopefully prior to follow-up visit to confirm or deny this finding.   Glenetta Hew, MD  Ivin Booty - can we go ahead & order 2d Echo - Dx Pulmonary Hypertension

## 2021-12-28 NOTE — Telephone Encounter (Signed)
Left detail message on  voicemail - CCTA  per DPR -  ordered Echo per Dr Ellyn Hack request. Any question may call back

## 2021-12-31 NOTE — Telephone Encounter (Signed)
Patient's wife returning call. 

## 2021-12-31 NOTE — Telephone Encounter (Signed)
Spoke with pt wife, aware of CT results. Echo scheduled.

## 2022-01-03 DIAGNOSIS — M791 Myalgia, unspecified site: Secondary | ICD-10-CM | POA: Diagnosis not present

## 2022-01-03 DIAGNOSIS — E789 Disorder of lipoprotein metabolism, unspecified: Secondary | ICD-10-CM | POA: Diagnosis not present

## 2022-01-03 DIAGNOSIS — I2510 Atherosclerotic heart disease of native coronary artery without angina pectoris: Secondary | ICD-10-CM | POA: Diagnosis not present

## 2022-01-03 DIAGNOSIS — R7301 Impaired fasting glucose: Secondary | ICD-10-CM | POA: Diagnosis not present

## 2022-01-03 DIAGNOSIS — T466X5A Adverse effect of antihyperlipidemic and antiarteriosclerotic drugs, initial encounter: Secondary | ICD-10-CM | POA: Diagnosis not present

## 2022-01-03 DIAGNOSIS — E7800 Pure hypercholesterolemia, unspecified: Secondary | ICD-10-CM | POA: Diagnosis not present

## 2022-01-03 DIAGNOSIS — I10 Essential (primary) hypertension: Secondary | ICD-10-CM | POA: Diagnosis not present

## 2022-01-10 DIAGNOSIS — H25813 Combined forms of age-related cataract, bilateral: Secondary | ICD-10-CM | POA: Diagnosis not present

## 2022-01-11 DIAGNOSIS — Z7689 Persons encountering health services in other specified circumstances: Secondary | ICD-10-CM | POA: Diagnosis not present

## 2022-01-14 ENCOUNTER — Other Ambulatory Visit: Payer: Self-pay

## 2022-01-14 ENCOUNTER — Ambulatory Visit (HOSPITAL_COMMUNITY): Payer: PPO | Attending: Internal Medicine

## 2022-01-14 DIAGNOSIS — Z8249 Family history of ischemic heart disease and other diseases of the circulatory system: Secondary | ICD-10-CM

## 2022-01-14 DIAGNOSIS — I2510 Atherosclerotic heart disease of native coronary artery without angina pectoris: Secondary | ICD-10-CM | POA: Diagnosis not present

## 2022-01-14 DIAGNOSIS — R931 Abnormal findings on diagnostic imaging of heart and coronary circulation: Secondary | ICD-10-CM

## 2022-01-14 LAB — ECHOCARDIOGRAM COMPLETE
Area-P 1/2: 2.29 cm2
S' Lateral: 3 cm

## 2022-01-17 ENCOUNTER — Telehealth: Payer: Self-pay | Admitting: *Deleted

## 2022-01-17 NOTE — Telephone Encounter (Signed)
Left detail messages on both phone numbers listed,Per dpr.   Information also released to MyChart for review. Any question may call back  ?

## 2022-01-17 NOTE — Telephone Encounter (Signed)
-----   Message from Leonie Man, MD sent at 01/15/2022  9:02 PM EST ----- ?Echocardiogram results shows upper limit of normal pump function with an ejection fraction of 65 to 70%.  No wall motion abnormalities.  Mild thickening of the mitral valve leaflets.  Trivial mitral regurgitation.  Ascending aorta measured at 39 mm. ? ? ?Overall pretty much normal study. ? ?Glenetta Hew, MD ? ?

## 2022-02-25 ENCOUNTER — Ambulatory Visit: Payer: PPO | Admitting: Dermatology

## 2022-02-25 DIAGNOSIS — D485 Neoplasm of uncertain behavior of skin: Secondary | ICD-10-CM

## 2022-02-25 DIAGNOSIS — L820 Inflamed seborrheic keratosis: Secondary | ICD-10-CM

## 2022-02-25 DIAGNOSIS — Z1283 Encounter for screening for malignant neoplasm of skin: Secondary | ICD-10-CM

## 2022-02-25 NOTE — Patient Instructions (Signed)

## 2022-03-04 ENCOUNTER — Ambulatory Visit: Payer: PPO | Admitting: Cardiology

## 2022-03-04 ENCOUNTER — Encounter: Payer: Self-pay | Admitting: Cardiology

## 2022-03-04 VITALS — BP 116/68 | HR 61 | Ht 75.0 in | Wt 199.6 lb

## 2022-03-04 DIAGNOSIS — N1832 Chronic kidney disease, stage 3b: Secondary | ICD-10-CM | POA: Diagnosis not present

## 2022-03-04 DIAGNOSIS — R931 Abnormal findings on diagnostic imaging of heart and coronary circulation: Secondary | ICD-10-CM | POA: Diagnosis not present

## 2022-03-04 DIAGNOSIS — Z8249 Family history of ischemic heart disease and other diseases of the circulatory system: Secondary | ICD-10-CM | POA: Diagnosis not present

## 2022-03-04 DIAGNOSIS — I2510 Atherosclerotic heart disease of native coronary artery without angina pectoris: Secondary | ICD-10-CM

## 2022-03-04 DIAGNOSIS — E782 Mixed hyperlipidemia: Secondary | ICD-10-CM | POA: Diagnosis not present

## 2022-03-04 DIAGNOSIS — I272 Pulmonary hypertension, unspecified: Secondary | ICD-10-CM

## 2022-03-04 DIAGNOSIS — I10 Essential (primary) hypertension: Secondary | ICD-10-CM | POA: Diagnosis not present

## 2022-03-04 NOTE — Patient Instructions (Signed)

## 2022-03-04 NOTE — Progress Notes (Signed)
Primary Care Provider: Deland Pretty, MD [facility] Cardiologist: Glenetta Hew, MD Electrophysiologist: None  Clinic Note: Chief Complaint  Patient presents with   Follow-up    Test results: Coronary CTA and echocardiogram   ===================================  ASSESSMENT/PLAN   Problem List Items Addressed This Visit       Cardiology Problems   Pulmonary hypertension, unspecified ([place]) - suggested by CT    Suggested by CT scan, but no evidence to suggest elevated PA pressures on echocardiogram.       Essential hypertension (Chronic)    Blood pressure looks great on atenolol-HCTZ.  No changes for now.       Agatston coronary artery calcium score greater than 400 (Chronic)    As noted-Coronary Calcium Score prescribed, nonobstructive disease noted on Coronary CTA. More aggressive lipid management-consider adding Nexletol to Zetia versus Vascepa for elevated triglycerides. Also continue to encourage exercise       Left main coronary artery disease - Primary (Chronic)    Left main coronary calcification on CT angiogram and calcium score.  Mild to moderate plaque seen on coronary CTA.  Most significant lesion was probably mild nonobstructive lesion in the left main (25 to 49%.  With no severe disease in the LAD and LCx.  FFR negative.  Difficult to interpret, but now he is not really having more symptoms of chest pain, would probably continue to medical management.  Would be more aggressive with treating lipid panel-targeting LDL less than 70 if not less than 50.  Also noted closer blood pressure control.  Plan: Needs slightly better cholesterol management with triglycerides still being elevated and LDL being 77.  This is been managed by his PCP.  He is on Zetia, but has been intolerant of statins.  Consider Vascepa for the elevated triglycerides. Continue exercising and in order to determine progression of CAD.        Other   Family history of premature coronary  artery disease: Father & Brother (Chronic)   CKD (chronic kidney disease) stage 3, GFR 30-59 ml/min (HCC) (Chronic)   Mixed dyslipidemia (Chronic)  ===================================  HPI:    Eric Suarez is a 76 y.o. male with Strong Family History of CAD, Dyslipidemia (low HDL, high TG), HTN who is being seen today for follow-up evaluation of ELEVATED CORONARY CALCIUM SCORE/ review CORONARY CTA AND ECHOCARDIOGRAM RESULTS at the request of Deland Pretty, MD.  November 21 2021 by Dr. Shelia Media in follow-up after Coronary Calcium Score evaluation.  With low LDL and high triglycerides, Dr. Shelia Media chose to evaluate for risk stratification with a Coronary Calcium Score.  At the time of evaluation, he really did not notice any significant episode of chest pain or necessary worsening exertional dyspnea or exercise tolerance.  Cort-Ca score was elevated and therefore referred for cardiology evaluation.  EDDI HYMES was just seen on December 10, 2021 for routine cardiology evaluation indicated that he was doing well with no notable symptoms of chest pain or pressure/tightness with rest or exertion.  No resting dyspnea, but he did note mild exercise intolerance with exertional dyspnea.  He indicated that he had somewhat curtailed his exercise level and not doing as much routine walking or yard work over the last year or so.  Felt like he was just getting sedentary due to [place] but also being limited by back pain.  He has not had any exertional chest pain but may be some intermittent chest tightness or pressure with or without exertion.  He  is frustrated because several years ago he was able to play basketball and walk several miles a day, and cannot do that anymore. -> Indicated he was working with Dr. Shelia Media and his pharmacist to try to treat lipids.  They have tried multiple statins including rosuvastatin and atorvastatin as well as simvastatin-all of these led to muscle aches and cramping/myalgias.   Atorvastatin actually also had loose stools.  They are in the process of setting Zetia versus another statin.  Dr. Shelia Media ordered coronary calcium score that was elevated 535 with left main and LAD calcification. Coronary CTA ordered along with 2D echo  Recent Hospitalizations: None  Reviewed  CV studies:    The following studies were reviewed today: (if available, images/films reviewed: From Epic Chart or Care Everywhere) Coronary Calcium Score 10/31/2021: Total score 535, Left Main.  306, LAD 120, LCx 29, RCA 0 Coronary CTA 12/26/2021: Calcium score 536.  Normal coronary arteries origin with mild nonobstructive CAD (25 to 49%) Left Main and D1 stenosis with minimal (0-24%) plaque in the LAD and LCx.  No notable disease in the small & non-dominant RCA. 2D Echo 01/14/2022: EF 65 to 70%.  No RWMA.  Indeterminate diastolic pressure.  Mild Ascending Aortic dilation-39 mm.  Interval History:   Eric Suarez returns here today indicating that he really has not had any more chest discomfort.  He is really limited more by his knee pain he has become more active but the knees are really bothering him.  He has had intermittent injections to hold pain at bay.  Despite this, he mows at least 3 large yards without having any exertional chest tightness or pressure.  No significant dyspnea.  He is limited by his knee pain which tends to make him short of breath.  He denies any rapid irregular heartbeats or palpitations.  No claudication, just that his feet get cold and they stay cold very great easily.  Hard to keep them warm.  CV Review of Symptoms (Summary) Cardiovascular ROS: positive for - dyspnea on exertion and mostly just an exercise intolerance, but "nothing significant"-mostly limited by knee pain negative for - chest pain, edema, irregular heartbeat, orthopnea, palpitations, paroxysmal nocturnal dyspnea, rapid heart rate, shortness of breath, or lightheadedness, dizziness or wooziness, syncope/near  syncope or TIA/amaurosis fugax, claudication  REVIEWED OF SYSTEMS   Review of Systems  Constitutional:  Negative for malaise/fatigue (Does have some mild exertional intolerance) and weight loss.  HENT:  Negative for congestion and nosebleeds.   Respiratory:  Negative for cough and shortness of breath (Per HPI, some exertional dyspnea).   Cardiovascular:        Per HPI  Gastrointestinal:  Negative for blood in stool and melena.  Genitourinary:  Negative for dysuria, frequency and hematuria.  Musculoskeletal:  Positive for back pain. Negative for falls, joint pain and myalgias (No longer present, since he is no longer on statin).  Neurological:  Negative for dizziness, focal weakness and headaches.  Psychiatric/Behavioral: Negative.     I have reviewed and (if needed) personally updated the patient's problem list, medications, allergies, past medical and surgical history, social and family history.   PAST MEDICAL HISTORY   Past Medical History:  Diagnosis Date   Anxiety    Arthritis    BPH (benign prostatic hyperplasia)    Cataracts, bilateral    Chronic back pain    Followed by Dr. Arnoldo Morale   CKD (chronic kidney disease) stage 3, GFR 30-59 ml/min ([place]) 09/2021   Creatinine 1.47  Complication of anesthesia    unable to void after surgery   Coronary artery disease 10/31/2021   Coronary Calcium Score 535   GERD (gastroesophageal reflux disease)    History of hiatal hernia    History of kidney surgery    left kidney removed due to cancer 2017   History of thyroid cancer 1998   Thyroidectomy-now on Synthroid   Hypertension    Hypothyroidism (acquired) 1998   Post thyroidectomy for thyroid cancer   Macular degeneration of both eyes    dry   Mixed dyslipidemia 09/27/2021   Low HDL, high triglycerides with normal LDL: TC 117, TG 135, HDL 25, LDL 56.   PONV (postoperative nausea and vomiting)    Renal cell carcinoma, left ([place]) 2017   Left nephrectomy-Dr. Sherilyn Banker, complicated  by multiple different hernia surgeries.    PAST SURGICAL HISTORY   Past Surgical History:  Procedure Laterality Date   APPENDECTOMY     COLONOSCOPY     CYSTOSCOPY WITH RETROGRADE PYELOGRAM, URETEROSCOPY AND STENT PLACEMENT Left 04/01/2016   Procedure: CYSTOSCOPY WITH RETROGRADE PYELOGRAM, left ureter;  Surgeon: Raynelle Bring, MD;  Location: [facility];  Service: Urology;  Laterality: Left;   HEMORRHOID SURGERY     HERNIA REPAIR     inguinal-3 on left , 1 on right   INCISIONAL HERNIA REPAIR N/A 12/23/2016   Procedure: LAPAROSCOPIC VERSUS OPEN LYSIS OF ADHESIONS AND INCISIONAL HERNIA REPAIR WITH MESH;  Surgeon: Ralene Ok, MD;  Location: [place];  Service: General;  Laterality: N/A;   [place]  10/08/2017   OPEN   INCISIONAL HERNIA REPAIR N/A 10/08/2017   Procedure: [place];  Surgeon: Ralene Ok, MD;  Location: [place];  Service: General;  Laterality: N/A;   INSERTION OF MESH N/A 12/23/2016   Procedure: INSERTION OF MESH;  Surgeon: Ralene Ok, MD;  Location: [place];  Service: General;  Laterality: N/A;   INSERTION OF MESH N/A 01/10/2017   Procedure: INSERTION OF MESH;  Surgeon: Ralene Ok, MD;  Location: [place];  Service: General;  Laterality: N/A;   INSERTION OF MESH N/A 10/08/2017   Procedure: INSERTION OF MESH;  Surgeon: Ralene Ok, MD;  Location: [place];  Service: General;  Laterality: N/A;   LAPAROSCOPIC NEPHRECTOMY Left 04/18/2016   Procedure: LAPAROSCOPIC RADICAL  LEFT NEPHRECTOMY;  Surgeon: Raynelle Bring, MD;  Location: [facility];  Service: Urology;  Laterality: Left;   LAPAROSCOPY N/A 01/10/2017   Procedure: LAPAROSCOPY DIAGNOSTIC , POSSIBLE LAPAROTOMY;  Surgeon: Ralene Ok, MD;  Location: [place];  Service: General;  Laterality: N/A;   NASAL SINUS SURGERY     left side   NASAL SINUS SURGERY Left 10/25/2019   Procedure: ENDOSCOPIC SINUS SURGERY/ethmoid and maxillary;  Surgeon: Izora Gala, MD;  Location: [place];  Service: ENT;  Laterality: Left;   THYROIDECTOMY, PARTIAL  1998   left small amount on left side-for cancer   TONSILLECTOMY       There is no immunization history on file for this patient.  MEDICATIONS/ALLERGIES   Current Meds  Medication Sig   alfuzosin (UROXATRAL) 10 MG 24 hr tablet Take 10 mg daily by mouth.    Ascorbic Acid (VITAMIN C) 500 MG CHEW 1 tablet   aspirin 81 MG chewable tablet 1 tablet   atenolol-chlorthalidone (TENORETIC) 50-25 MG tablet Take 1 tablet by mouth daily. Patient only takes 1/2 tablet   Cholecalciferol (VITAMIN D3) 25 MCG (1000 UT) CAPS 1 capsule   dutasteride (AVODART)  0.5 MG capsule Take 0.5 mg daily by mouth.   ezetimibe (ZETIA) 10 MG tablet 1 tablet   famotidine (PEPCID) 20 MG tablet Take 20 mg by mouth 2 (two) times daily.   levothyroxine (SYNTHROID) 125 MCG tablet Take 125 mcg by mouth daily before breakfast.   metoprolol tartrate (LOPRESSOR) 50 MG tablet Take 1 tablet (50 mg total) by mouth once for 1 dose. TAKE TWO HOURS PRIOR TO  SCHEDULE CARDIAC TEST   Multiple Vitamins-Minerals (EYE VITAMINS PO) Take by mouth.   omeprazole (PRILOSEC) 20 MG capsule Take 20 mg by mouth daily.   OVER THE COUNTER MEDICATION Take 2 tablets daily by mouth. Cognium - Brain Vitamin   PARoxetine (PAXIL-CR) 25 MG 24 hr tablet Take 25 mg by mouth daily.   potassium chloride SA (K-DUR,KLOR-CON) 20 MEQ tablet Take 20 mEq by mouth daily.   vitamin C (ASCORBIC ACID) 500 MG tablet Take 500 mg by mouth daily.   Zinc 100 MG TABS 1 tablet   Zinc Sulfate (ZINC 15 PO) Take by mouth.    Allergies  Allergen Reactions   Tamsulosin Nausea And Vomiting and Other (See Comments)    Uncomfortable in pelvic region   Sulfa Antibiotics Rash    SOCIAL HISTORY/FAMILY HISTORY   Reviewed in Epic:   Social History   Tobacco Use   Smoking status: Never   Smokeless tobacco: Never  Vaping Use   Vaping Use: Never used  Substance Use Topics   Alcohol use: No    Drug use: No   Social History   Social History Narrative   Married father 1 son who lives in [place].  He lives with his wife.      Drink 2 cups of coffee a day.      Previously used to exercise quite a bit with significant daily walking and mowing multiple lawns.  (His house, along with several neighbors and the church lawn) now he does still do some yard work and occasional walking.  But since the onset of COVID, has slowly declined in and out of exercise.   Family History  Problem Relation Age of Onset   Dementia Mother    Coronary artery disease Father 38       Longstanding smoker   Heart attack Father 57       Second MI was at 28   Hypertension Father    Hyperlipidemia Father    Heart attack Brother 75       Sudden cardiac death from MI   Healthy Son     OBJCTIVE -PE, EKG, labs   Wt Readings from Last 3 Encounters:  03/21/22 200 lb (90.7 kg)  03/04/22 199 lb 9.6 oz (90.5 kg)  12/10/21 199 lb (90.3 kg)    Physical Exam: BP 116/68   Pulse 61   Ht '6\' 3"'$  (1.905 m)   Wt 199 lb 9.6 oz (90.5 kg)   SpO2 100%   BMI 24.95 kg/m  Physical Exam Vitals reviewed.  Constitutional:      General: He is not in acute distress.    Appearance: He is well-developed and normal weight. He is not ill-appearing or toxic-appearing.  HENT:     Head: Normocephalic and atraumatic.  Neck:     Vascular: No carotid bruit.  Cardiovascular:     Rate and Rhythm: Normal rate and regular rhythm. No extrasystoles are present.    Chest Wall: PMI is not displaced.     Pulses: Normal pulses.  Heart sounds: Normal heart sounds, S1 normal and S2 normal. No murmur heard.   No friction rub. No gallop.  Pulmonary:     Effort: Pulmonary effort is normal. No respiratory distress.     Breath sounds: Normal breath sounds. No wheezing, rhonchi or rales.  Chest:     Chest wall: No tenderness.  Abdominal:     General: Abdomen is flat. Bowel sounds are normal. There is no distension.      Palpations: Abdomen is soft. There is no mass.     Tenderness: There is no abdominal tenderness.     Comments: No HSM or bruit  Musculoskeletal:        General: No swelling. Normal range of motion.     Cervical back: Normal range of motion and neck supple.  Skin:    General: Skin is warm and dry.  Neurological:     General: No focal deficit present.     Mental Status: He is alert and oriented to person, place, and time.     Gait: Gait normal.  Psychiatric:        Mood and Affect: Mood normal.        Behavior: Behavior normal.        Thought Content: Thought content normal.        Judgment: Judgment normal.     Adult ECG Report  Rate: 55;  Rhythm: normal sinus rhythm and LAFB, borderline tolerated better ;   Narrative Interpretation: Borderline  Recent Labs: 09/28/2021-reviewed Na+ 136, K+ 4.3, Cl- 100, HCO3-26, BUN 23, Cr 1.47, Glu 114, Ca2+ 8.6; AST 21, ALT 28, AlkP 93; T bili 1.3, T protein 7.1, Alb 4.2 CBC: W 6.8, H/H 14.3/42.3, Plt 178 TC 117, TG 175, HDL 26, LDL 56; TSH 2.65   ==================================================  COVID-19 Education: The signs and symptoms of COVID-19 were discussed with the patient and how to seek care for testing (follow up with PCP or arrange E-visit).    I spent a total of 27 minutes with the patient spent in direct patient consultation.  Additional time spent with chart review  / charting (studies, outside notes, etc): 28 min Total Time: 56 min  Current medicines are reviewed at length with the patient today.  (+/- concerns) none  This visit occurred during the SARS-CoV-2 public health emergency.  Safety protocols were in place, including screening questions prior to the visit, additional usage of staff PPE, and extensive cleaning of exam room while observing appropriate contact time as indicated for disinfecting solutions.  Notice: This dictation was prepared with Dragon dictation along with smart phrase technology. Any  transcriptional errors that result from this process are unintentional and may not be corrected upon review.   Studies Ordered:  No orders of the defined types were placed in this encounter.   Patient Instructions / Medication Changes & Studies & Tests Ordered   Patient Instructions  Medication Instructions:    METOPROLOL TARTRATE 50 MG - ONCE - SEE BELOW INSTRUCTIONS  *If you need a refill on your cardiac medications before your next appointment, please call your pharmacy*   Lab Work: BMP  If you have labs (blood work) drawn today and your tests are completely normal, you will receive your results only by: MyChart Message (if you have MyChart) OR A paper copy in the mail If you have any lab test that is abnormal or we need to change your treatment, we will call you to review the results.   Testing/Procedures:  Will be  schedule at [place]. Your physician has requested that you have cardiac CTA. Cardiac computed tomography (CT) is a painless test that uses an x-ray machine to take clear, detailed pictures of your heart.  Please follow instruction sheet as given.     Follow-Up: At [facility], you and your health needs are our priority.  As part of our continuing mission to provide you with exceptional heart care, we have created designated Provider Care Teams.  These Care Teams include your primary Cardiologist (physician) and Advanced Practice Providers (APPs -  Physician Assistants and Nurse Practitioners) who all work together to provide you with the care you need, when you need it.  We recommend signing up for the patient portal called "MyChart".  Sign up information is provided on this After Visit Summary.  MyChart is used to connect with patients for Virtual Visits (Telemedicine).  Patients are able to view lab/test results, encounter notes, upcoming appointments, etc.  Non-urgent messages can be sent to your provider as well.   To learn more  about what you can do with MyChart, go to [email or web address].    Your next appointment:   3 month(s)  The format for your next appointment:   In Person  Provider:   Glenetta Hew, MD     Other Instructions   Your cardiac CT will be scheduled at the below location:   [facility] [address] [place], [place] [address] [phone]     Glenetta Hew, M.D., M.S. Interventional Cardiologist   Pager # [phone] Phone # [phone] [address] [place], [place] [address]   Thank you for choosing Heartcare at [facility]!!

## 2022-03-14 ENCOUNTER — Encounter: Payer: Self-pay | Admitting: Dermatology

## 2022-03-14 NOTE — Progress Notes (Signed)
? ?  Follow-Up Visit ?  ?Subjective  ?Eric Suarez is a 76 y.o. male who presents for the following: Annual Exam (Here for annual skin exam. No concerns. ). ? ?General skin examination ?Location:  ?Duration:  ?Quality:  ?Associated Signs/Symptoms: ?Modifying Factors:  ?Severity:  ?Timing: ?Context:  ? ?Objective  ?Well appearing patient in no apparent distress; mood and affect are within normal limits. ?Left Anterior Neck ?8 mm crusted pearly pink papule ? ? ? ? ? ? ?Mid Back ?General skin examination: No atypical pigmented lesions (all checked with dermoscopy).  1 possible nonmelanoma skin cancer left side of neck will be biopsied. ? ? ? ?A full examination was performed including scalp, head, eyes, ears, nose, lips, neck, chest, axillae, abdomen, back, buttocks, bilateral upper extremities, bilateral lower extremities, hands, feet, fingers, toes, fingernails, and toenails. All findings within normal limits unless otherwise noted below. ? ? ?Assessment & Plan  ? ? ?Neoplasm of uncertain behavior of skin ?Left Anterior Neck ? ?Skin / nail biopsy ?Type of biopsy: tangential   ?Informed consent: discussed and consent obtained   ?Timeout: patient name, date of birth, surgical site, and procedure verified   ?Anesthesia: the lesion was anesthetized in a standard fashion   ?Anesthetic:  1% lidocaine w/ epinephrine 1-100,000 local infiltration ?Instrument used: flexible razor blade   ?Hemostasis achieved with: ferric subsulfate   ?Outcome: patient tolerated procedure well   ?Post-procedure details: wound care instructions given   ? ?Specimen 1 - Surgical pathology ?Differential Diagnosis: scc vs bcc  ? ?Check Margins: No ? ?Screening for malignant neoplasm of skin ?Mid Back ? ?Annual skin examination. ? ? ? ? ? ?I, Lavonna Monarch, MD, have reviewed all documentation for this visit.  The documentation on 03/14/22 for the exam, diagnosis, procedures, and orders are all accurate and complete. ?

## 2022-03-21 ENCOUNTER — Ambulatory Visit: Payer: PPO | Admitting: Orthopedic Surgery

## 2022-03-21 ENCOUNTER — Ambulatory Visit (INDEPENDENT_AMBULATORY_CARE_PROVIDER_SITE_OTHER): Payer: PPO

## 2022-03-21 ENCOUNTER — Telehealth: Payer: Self-pay

## 2022-03-21 VITALS — Ht 75.0 in | Wt 200.0 lb

## 2022-03-21 DIAGNOSIS — M25561 Pain in right knee: Secondary | ICD-10-CM

## 2022-03-21 DIAGNOSIS — M170 Bilateral primary osteoarthritis of knee: Secondary | ICD-10-CM

## 2022-03-21 NOTE — Telephone Encounter (Signed)
Noted  

## 2022-03-21 NOTE — Telephone Encounter (Signed)
Auth needed for right knee gel injection  

## 2022-03-22 ENCOUNTER — Encounter: Payer: Self-pay | Admitting: Orthopedic Surgery

## 2022-03-22 MED ORDER — LIDOCAINE HCL 1 % IJ SOLN
5.0000 mL | INTRAMUSCULAR | Status: AC | PRN
  Administered 2022-03-21: 5 mL

## 2022-03-22 MED ORDER — METHYLPREDNISOLONE ACETATE 40 MG/ML IJ SUSP
40.0000 mg | INTRAMUSCULAR | Status: AC | PRN
  Administered 2022-03-21: 40 mg via INTRA_ARTICULAR

## 2022-03-22 MED ORDER — BUPIVACAINE HCL 0.25 % IJ SOLN
4.0000 mL | INTRAMUSCULAR | Status: AC | PRN
  Administered 2022-03-21: 4 mL via INTRA_ARTICULAR

## 2022-03-22 NOTE — Progress Notes (Signed)
? ?Office Visit Note ?  ?Patient: Eric Suarez           ?Date of Birth: 1946-03-11           ?MRN: [id number] ?Visit Date: 03/21/2022 ?Requested by: Deland Pretty, MD ?[place],  [place] [address] ?PCP: Deland Pretty, MD ? ?Subjective: ?Chief Complaint  ?Patient presents with  ? Right Knee - Pain  ? ? ?HPI: Eric Suarez is a 76 year old patient with bilateral knee arthritis.  Reports right knee pain for 1 to 2 months after stepping in a drain hole.  Reported swelling and weakness at that time.  Does not wake him from sleep at night.  Unable to take ibuprofen because he only has 1 kidney.  Takes Tylenol with some relief.  Overall his right and left shoulder pain has reached an equilibrium that he can live with. ?             ?ROS: All systems reviewed are negative as they relate to the chief complaint within the history of present illness.  Patient denies  fevers or chills. ? ? ?Assessment & Plan: ?Visit Diagnoses:  ?1. Right knee pain, unspecified chronicity   ?2. Primary osteoarthritis of both knees   ? ? ?Plan: Impression is right knee pain with twisting injury which has likely exacerbated arthritis.  No acute fracture.  Cortisone injection performed today.  Preapproved for gel injection in 6 weeks.  Patient does not want knee replacement. ?This patient is diagnosed with osteoarthritis of the knee(s).   ? ?Radiographs show evidence of joint space narrowing, osteophytes, subchondral sclerosis and/or subchondral cysts.  This patient has knee pain which interferes with functional and activities of daily living.   ? ?This patient has experienced inadequate response, adverse effects and/or intolerance with conservative treatments such as acetaminophen, NSAIDS, topical creams, physical therapy or regular exercise, knee bracing and/or weight loss.  ? ?This patient has experienced inadequate response or has a contraindication to intra articular steroid injections for at least 3 months.  ? ?This  patient is not scheduled to have a total knee replacement within 6 months of starting treatment with viscosupplementation. ? ? ?Follow-Up Instructions: Return in about 6 weeks (around 05/02/2022).  ? ?Orders:  ?Orders Placed This Encounter  ?Procedures  ? XR KNEE 3 VIEW RIGHT  ? ?No orders of the defined types were placed in this encounter. ? ? ? ? Procedures: ?Large Joint Inj: R knee on 03/21/2022 7:14 AM ?Indications: diagnostic evaluation, joint swelling and pain ?Details: 18 G 1.5 in needle, superolateral approach ? ?Arthrogram: No ? ?Medications: 5 mL lidocaine 1 %; 40 mg methylPREDNISolone acetate 40 MG/ML; 4 mL bupivacaine 0.25 % ?Outcome: tolerated well, no immediate complications ?Procedure, treatment alternatives, risks and benefits explained, specific risks discussed. Consent was given by the patient. Immediately prior to procedure a time out was called to verify the correct patient, procedure, equipment, support staff and site/side marked as required. Patient was prepped and draped in the usual sterile fashion.  ? ? ? ? ?Clinical Data: ?No additional findings. ? ?Objective: ?Vital Signs: Ht '6\' 3"'$  (1.905 m)   Wt 200 lb (90.7 kg)   BMI 25.00 kg/m?  ? ?Physical Exam:  ? ?Constitutional: Patient appears well-developed ?HEENT:  ?Head: Normocephalic ?Eyes:EOM are normal ?Neck: Normal range of motion ?Cardiovascular: Normal rate ?Pulmonary/chest: Effort normal ?Neurologic: Patient is alert ?Skin: Skin is warm ?Psychiatric: Patient has normal mood and affect ? ? ?Ortho Exam: Ortho exam demonstrates palpable pedal pulses  with mild effusion right knee no effusion left knee.  Patient has significant patellofemoral crepitus and grinding in both knees with passive and active range of motion.  Pedal pulses palpable.  Slight flexion contraction on the right-hand side at about 5 degrees.  Left leg some straight.  Both knees flex to about 110.  Extensor mechanism intact. ? ?Specialty Comments:  ?No specialty comments  available. ? ?Imaging: ?XR KNEE 3 VIEW RIGHT ? ?Result Date: 03/22/2022 ?AP lateral merchant radiographs right knee reviewed.  Tricompartmental Arthur arthritis is present most severe in the lateral and patellofemoral compartments.  No acute fracture.  Alignment neutral.  Bone quality appears good.  ? ? ?PMFS History: ?Patient Active Problem List  ? Diagnosis Date Noted  ? Pulmonary hypertension, unspecified ([facility]) - suggested by CT 12/27/2021  ? Agatston coronary artery calcium score greater than 400 12/10/2021  ? Mixed dyslipidemia 12/10/2021  ? Family history of premature coronary artery disease: Father & Brother 12/10/2021  ? Nonspecific abnormal electrocardiogram (ECG) (EKG) 12/10/2021  ? Left main coronary artery disease 12/10/2021  ? Exercise intolerance - CRO Atypical Angina 12/10/2021  ? S/P hernia repair 10/08/2017  ? Chronic pain of both knees 09/04/2017  ? Folliculitis 72/07/4708  ? SBO (small bowel obstruction) ([facility]) 01/10/2017  ? Post-operative complication 62/83/6629  ? Severe sepsis ([facility]) 12/25/2016  ? Flu-like symptoms 12/25/2016  ? Dehydration with hyponatremia 12/25/2016  ? AKI (acute kidney injury) ([facility]) 12/25/2016  ? CKD (chronic kidney disease) stage 3, GFR 30-59 ml/min ([facility]) 12/25/2016  ? S/P repair of ventral hernia 12/25/2016  ? Hypothyroidism 12/25/2016  ? Essential hypertension 12/25/2016  ? Neoplasm of left kidney 04/18/2016  ? Postoperative examination 03/07/2016  ? Strangulated inguinal hernia 02/29/2016  ? ?Past Medical History:  ?Diagnosis Date  ? Anxiety   ? Arthritis   ? BPH (benign prostatic hyperplasia)   ? Cataracts, bilateral   ? Chronic back pain   ? Followed by Dr. Arnoldo Morale  ? CKD (chronic kidney disease) stage 3, GFR 30-59 ml/min ([facility]) 09/2021  ? Creatinine 1.47  ? Complication of anesthesia   ? unable to void after surgery  ? Coronary artery disease 10/31/2021  ? Coronary Calcium Score 535  ? GERD (gastroesophageal reflux disease)   ? History of hiatal hernia   ? History of  kidney surgery   ? left kidney removed due to cancer 2017  ? History of thyroid cancer 1998  ? Thyroidectomy-now on Synthroid  ? Hypertension   ? Hypothyroidism (acquired) 1998  ? Post thyroidectomy for thyroid cancer  ? Macular degeneration of both eyes   ? dry  ? Mixed dyslipidemia 09/27/2021  ? Low HDL, high triglycerides with normal LDL: TC 117, TG 135, HDL 25, LDL 56.  ? PONV (postoperative nausea and vomiting)   ? Renal cell carcinoma, left ([facility]) 2017  ? Left nephrectomy-Dr. Sherilyn Banker, complicated by multiple different hernia surgeries.  ?  ?Family History  ?Problem Relation Age of Onset  ? Dementia Mother   ? Coronary artery disease Father 40  ?     Longstanding smoker  ? Heart attack Father 58  ?     Second MI was at 14  ? Hypertension Father   ? Hyperlipidemia Father   ? Heart attack Brother 14  ?     Sudden cardiac death from MI  ? Healthy Son   ?  ?Past Surgical History:  ?Procedure Laterality Date  ? APPENDECTOMY    ? COLONOSCOPY    ? CYSTOSCOPY WITH  RETROGRADE PYELOGRAM, URETEROSCOPY AND STENT PLACEMENT Left 04/01/2016  ? Procedure: CYSTOSCOPY WITH RETROGRADE PYELOGRAM, left ureter;  Surgeon: Raynelle Bring, MD;  Location: [facility];  Service: Urology;  Laterality: Left;  ? HEMORRHOID SURGERY    ? HERNIA REPAIR    ? inguinal-3 on left , 1 on right  ? INCISIONAL HERNIA REPAIR N/A 12/23/2016  ? Procedure: LAPAROSCOPIC VERSUS OPEN LYSIS OF ADHESIONS AND INCISIONAL HERNIA REPAIR WITH MESH;  Surgeon: Ralene Ok, MD;  Location: [facility];  Service: General;  Laterality: N/A;  ? [place]  10/08/2017  ? OPEN  ? INCISIONAL HERNIA REPAIR N/A 10/08/2017  ? Procedure: OPEN HERNIA REPAIR INCISIONAL ERAS PATHWAY;  Surgeon: Ralene Ok, MD;  Location: [facility];  Service: General;  Laterality: N/A;  ? INSERTION OF MESH N/A 12/23/2016  ? Procedure: INSERTION OF MESH;  Surgeon: Ralene Ok, MD;  Location: [facility];  Service: General;  Laterality: N/A;  ? INSERTION OF MESH N/A 01/10/2017  ? Procedure:  INSERTION OF MESH;  Surgeon: Ralene Ok, MD;  Location: [facility];  Service: General;  Laterality: N/A;  ? INSERTION OF MESH N/A 10/08/2017  ? Procedure: INSERTION OF MESH;  Surgeon: Ralene Ok, MD;  Location:

## 2022-04-11 ENCOUNTER — Encounter: Payer: Self-pay | Admitting: Cardiology

## 2022-04-11 NOTE — Assessment & Plan Note (Signed)
Blood pressure looks great on atenolol-HCTZ.  No changes for now.

## 2022-04-11 NOTE — Assessment & Plan Note (Signed)
Suggested by CT scan, but no evidence to suggest elevated PA pressures on echocardiogram.

## 2022-04-11 NOTE — Assessment & Plan Note (Signed)
As noted-Coronary Calcium Score prescribed, nonobstructive disease noted on Coronary CTA. More aggressive lipid management-consider adding Nexletol to Zetia versus Vascepa for elevated triglycerides. Also continue to encourage exercise

## 2022-04-11 NOTE — Assessment & Plan Note (Signed)
Left main coronary calcification on CT angiogram and calcium score.  Mild to moderate plaque seen on coronary CTA.  Most significant lesion was probably mild nonobstructive lesion in the left main (25 to 49%.  With no severe disease in the LAD and LCx.  FFR negative.  Difficult to interpret, but now he is not really having more symptoms of chest pain, would probably continue to medical management.  Would be more aggressive with treating lipid panel-targeting LDL less than 70 if not less than 50.  Also noted closer blood pressure control.  Plan:  Needs slightly better cholesterol management with triglycerides still being elevated and LDL being 77.  This is been managed by his PCP.  He is on Zetia, but has been intolerant of statins.  Consider Vascepa for the elevated triglycerides.  Continue exercising and in order to determine progression of CAD.

## 2022-05-03 ENCOUNTER — Ambulatory Visit: Payer: PPO | Admitting: Orthopedic Surgery

## 2022-05-03 DIAGNOSIS — M25561 Pain in right knee: Secondary | ICD-10-CM | POA: Diagnosis not present

## 2022-05-04 ENCOUNTER — Encounter: Payer: Self-pay | Admitting: Orthopedic Surgery

## 2022-05-05 ENCOUNTER — Other Ambulatory Visit: Payer: PPO

## 2022-05-08 ENCOUNTER — Telehealth: Payer: Self-pay

## 2022-05-08 NOTE — Telephone Encounter (Signed)
BV pending for Monovisc, right knee.

## 2022-05-18 ENCOUNTER — Ambulatory Visit
Admission: RE | Admit: 2022-05-18 | Discharge: 2022-05-18 | Disposition: A | Discharge status: Home / Self Care | Payer: PPO | Source: Ambulatory Visit | Attending: Orthopedic Surgery | Admitting: Orthopedic Surgery

## 2022-05-18 DIAGNOSIS — M25561 Pain in right knee: Secondary | ICD-10-CM | POA: Diagnosis not present

## 2022-05-21 ENCOUNTER — Other Ambulatory Visit: Payer: Self-pay

## 2022-05-21 DIAGNOSIS — M170 Bilateral primary osteoarthritis of knee: Secondary | ICD-10-CM

## 2022-05-22 ENCOUNTER — Ambulatory Visit: Payer: PPO | Admitting: Orthopedic Surgery

## 2022-05-29 ENCOUNTER — Encounter: Payer: Self-pay | Admitting: Orthopedic Surgery

## 2022-05-29 ENCOUNTER — Ambulatory Visit: Payer: PPO | Admitting: Orthopedic Surgery

## 2022-05-29 DIAGNOSIS — M170 Bilateral primary osteoarthritis of knee: Secondary | ICD-10-CM | POA: Diagnosis not present

## 2022-05-29 NOTE — Progress Notes (Signed)
Office Visit Note   Patient: Eric Suarez           Date of Birth: 13-Nov-1945           MRN: [id number] Visit Date: 05/29/2022 Requested by: Deland Pretty, MD [place] [place],  [place] [address] PCP: Deland Pretty, MD  Subjective: Chief Complaint  Patient presents with   Right Knee - Follow-up    HPI: Eric Suarez is a 76 year old patient with right knee pain.  Since he was last seen he has had an MRI scan which shows severe patellofemoral arthritis along with medial and lateral compartment arthritis with degenerative meniscal tearing but no obvious loose chondral flaps or meniscal flaps.  Subchondral marrow edema and present in both feet and plateaus.  Patient              ROS: All systems reviewed are negative as they relate to the chief complaint within the history of present illness.  Patient denies  fevers or chills.   Assessment & Plan: Visit Diagnoses:  1. Primary osteoarthritis of both knees     Plan: Impression is bilateral knee arthritis right worse than left.  Left knee relatively asymptomatic but right knee has improved significantly since the knee injection on May 11.  Complete relief lasted about 1 to 2 weeks and pressure relief has persisted.  Gel injection not preferred by the patient at this time.  Continue with episodic cortisone injections not more than 3/year.  He will know when it is time for knee replacement.  Currently he is not there yet symptomatically.  Follow-Up Instructions: Return if symptoms worsen or fail to improve.   Orders:  No orders of the defined types were placed in this encounter.  No orders of the defined types were placed in this encounter.     Procedures: No procedures performed   Clinical Data: No additional findings.  Objective: Vital Signs: There were no vitals taken for this visit.  Physical Exam:   Constitutional: Patient appears well-developed HEENT:  Head: Normocephalic Eyes:EOM are normal Neck:  Normal range of motion Cardiovascular: Normal rate Pulmonary/chest: Effort normal Neurologic: Patient is alert Skin: Skin is warm Psychiatric: Patient has normal mood and affect   Ortho Exam: Ortho exam demonstrates range of motion 8-105 with trace effusion.  Extensor mechanism intact.  Collateral and cruciate ligaments are stable.  No other masses lymphadenopathy or skin changes noted in the right knee region.  Specialty Comments:  No specialty comments available.  Imaging: No results found.   PMFS History: Patient Active Problem List   Diagnosis Date Noted   Pulmonary hypertension, unspecified ([facility]) - suggested by CT 12/27/2021   Agatston coronary artery calcium score greater than 400 12/10/2021   Mixed dyslipidemia 12/10/2021   Family history of premature coronary artery disease: Father & Brother 12/10/2021   Nonspecific abnormal electrocardiogram (ECG) (EKG) 12/10/2021   Left main coronary artery disease 12/10/2021   Exercise intolerance - CRO Atypical Angina 12/10/2021   S/P hernia repair 10/08/2017   Chronic pain of both knees 34/19/6222   Folliculitis 97/98/9211   SBO (small bowel obstruction) ([facility]) 01/10/2017   Post-operative complication 94/17/4081   Severe sepsis ([facility]) 12/25/2016   Flu-like symptoms 12/25/2016   Dehydration with hyponatremia 12/25/2016   AKI (acute kidney injury) ([facility]) 12/25/2016   CKD (chronic kidney disease) stage 3, GFR 30-59 ml/min ([facility]) 12/25/2016   S/P repair of ventral hernia 12/25/2016   Hypothyroidism 12/25/2016   Essential hypertension 12/25/2016   Neoplasm  of left kidney 04/18/2016   Postoperative examination 03/07/2016   Strangulated inguinal hernia 02/29/2016   Past Medical History:  Diagnosis Date   Anxiety    Arthritis    BPH (benign prostatic hyperplasia)    Cataracts, bilateral    Chronic back pain    Followed by Dr. Arnoldo Morale   CKD (chronic kidney disease) stage 3, GFR 30-59 ml/min ([facility]) 09/2021   Creatinine 1.93    Complication of anesthesia    unable to void after surgery   Coronary artery disease 10/31/2021   Coronary Calcium Score 535   GERD (gastroesophageal reflux disease)    History of hiatal hernia    History of kidney surgery    left kidney removed due to cancer 2017   History of thyroid cancer 1998   Thyroidectomy-now on Synthroid   Hypertension    Hypothyroidism (acquired) 1998   Post thyroidectomy for thyroid cancer   Macular degeneration of both eyes    dry   Mixed dyslipidemia 09/27/2021   Low HDL, high triglycerides with normal LDL: TC 117, TG 135, HDL 25, LDL 56.   PONV (postoperative nausea and vomiting)    Renal cell carcinoma, left ([facility]) 2017   Left nephrectomy-Dr. Sherilyn Banker, complicated by multiple different hernia surgeries.    Family History  Problem Relation Age of Onset   Dementia Mother    Coronary artery disease Father 21       Longstanding smoker   Heart attack Father 32       Second MI was at 61   Hypertension Father    Hyperlipidemia Father    Heart attack Brother 8       Sudden cardiac death from MI   Healthy Son     Past Surgical History:  Procedure Laterality Date   APPENDECTOMY     COLONOSCOPY     CYSTOSCOPY WITH RETROGRADE PYELOGRAM, URETEROSCOPY AND STENT PLACEMENT Left 04/01/2016   Procedure: CYSTOSCOPY WITH RETROGRADE PYELOGRAM, left ureter;  Surgeon: Raynelle Bring, MD;  [facility];  Service: Urology;  Laterality: Left;   HEMORRHOID SURGERY     HERNIA REPAIR     inguinal-3 on left , 1 on right   INCISIONAL HERNIA REPAIR N/A 12/23/2016   Procedure: LAPAROSCOPIC VERSUS OPEN LYSIS OF ADHESIONS AND INCISIONAL HERNIA REPAIR WITH MESH;  Surgeon: Ralene Ok, MD;  Location: [facility];  Service: General;  Laterality: N/A;   [facility]  10/08/2017   OPEN   INCISIONAL HERNIA REPAIR N/A 10/08/2017   Procedure: [facility];  Surgeon: Ralene Ok, MD;  Location: [facility];  Service: General;  Laterality:  N/A;   INSERTION OF MESH N/A 12/23/2016   Procedure: INSERTION OF MESH;  Surgeon: Ralene Ok, MD;  Location: [facility];  Service: General;  Laterality: N/A;   INSERTION OF MESH N/A 01/10/2017   Procedure: INSERTION OF MESH;  Surgeon: Ralene Ok, MD;  Location: [facility];  Service: General;  Laterality: N/A;   INSERTION OF MESH N/A 10/08/2017   Procedure: INSERTION OF MESH;  Surgeon: Ralene Ok, MD;  Location: [facility];  Service: General;  Laterality: N/A;   LAPAROSCOPIC NEPHRECTOMY Left 04/18/2016   Procedure: LAPAROSCOPIC RADICAL  LEFT NEPHRECTOMY;  Surgeon: Raynelle Bring, MD;  [facility];  Service: Urology;  Laterality: Left;   LAPAROSCOPY N/A 01/10/2017   Procedure: LAPAROSCOPY DIAGNOSTIC , POSSIBLE LAPAROTOMY;  Surgeon: Ralene Ok, MD;  Location: [facility];  Service: General;  Laterality: N/A;   NASAL SINUS SURGERY  left side   NASAL SINUS SURGERY Left 10/25/2019   Procedure: ENDOSCOPIC SINUS SURGERY/ethmoid and maxillary;  Surgeon: Izora Gala, MD;  Location: [facility];  Service: ENT;  Laterality: Left;   THYROIDECTOMY, PARTIAL  1998   left small amount on left side-for cancer   TONSILLECTOMY     Social History   Occupational History   Not on file  Tobacco Use   Smoking status: Never   Smokeless tobacco: Never  Vaping Use   Vaping Use: Never used  Substance and Sexual Activity   Alcohol use: No   Drug use: No   Sexual activity: Not on file

## 2022-07-30 DIAGNOSIS — R519 Headache, unspecified: Secondary | ICD-10-CM | POA: Diagnosis not present

## 2022-07-30 DIAGNOSIS — S0990XA Unspecified injury of head, initial encounter: Secondary | ICD-10-CM | POA: Diagnosis not present

## 2022-08-09 ENCOUNTER — Other Ambulatory Visit: Payer: Self-pay | Admitting: Internal Medicine

## 2022-08-09 DIAGNOSIS — S0990XA Unspecified injury of head, initial encounter: Secondary | ICD-10-CM

## 2022-09-10 ENCOUNTER — Ambulatory Visit
Admission: RE | Admit: 2022-09-10 | Discharge: 2022-09-10 | Disposition: A | Discharge status: Home / Self Care | Payer: PPO | Source: Ambulatory Visit | Attending: Internal Medicine | Admitting: Internal Medicine

## 2022-09-10 DIAGNOSIS — S0990XA Unspecified injury of head, initial encounter: Secondary | ICD-10-CM

## 2022-09-10 DIAGNOSIS — R519 Headache, unspecified: Secondary | ICD-10-CM | POA: Diagnosis not present

## 2022-09-27 DIAGNOSIS — Q600 Renal agenesis, unilateral: Secondary | ICD-10-CM | POA: Diagnosis not present

## 2022-09-27 DIAGNOSIS — M5481 Occipital neuralgia: Secondary | ICD-10-CM | POA: Diagnosis not present

## 2022-10-02 ENCOUNTER — Ambulatory Visit (INDEPENDENT_AMBULATORY_CARE_PROVIDER_SITE_OTHER): Payer: PPO | Admitting: Orthopedic Surgery

## 2022-10-02 DIAGNOSIS — R7303 Prediabetes: Secondary | ICD-10-CM | POA: Diagnosis not present

## 2022-10-02 DIAGNOSIS — M170 Bilateral primary osteoarthritis of knee: Secondary | ICD-10-CM | POA: Diagnosis not present

## 2022-10-02 DIAGNOSIS — I10 Essential (primary) hypertension: Secondary | ICD-10-CM | POA: Diagnosis not present

## 2022-10-02 DIAGNOSIS — Z125 Encounter for screening for malignant neoplasm of prostate: Secondary | ICD-10-CM | POA: Diagnosis not present

## 2022-10-05 ENCOUNTER — Encounter: Payer: Self-pay | Admitting: Orthopedic Surgery

## 2022-10-05 NOTE — Progress Notes (Signed)
Office Visit Note   Patient: Eric Suarez           Date of Birth: 1946/08/13           MRN: [id number] Visit Date: 10/02/2022 Requested by: Deland Pretty, MD [address] [place],  [place] [address] PCP: Deland Pretty, MD  Subjective: Chief Complaint  Patient presents with   Right Knee - Pain    HPI: Eric Suarez is a 76 y.o. male who presents to the office reporting worsening right knee pain.  He has had multiple injections of cortisone and the gel does not work too much anymore.  Would like to proceed with knee replacement.  He does have a history of coronary artery disease.  Takes nitroglycerin "just in case.  Also has a solitary kidney.  The kidney is functioning well.  No personal or family history of DVT or pulmonary embolism.  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  The pain does limit his walking and standing endurance.  He lives with his wife at home..                ROS: All systems reviewed are negative as they relate to the chief complaint within the history of present illness.  Patient denies fevers or chills.  Assessment & Plan: Visit Diagnoses:  1. Primary osteoarthritis of both knees     Plan: Impression is end-stage right knee arthritis primarily patellofemoral but also extending into the medial and lateral compartments.  Plan is right total knee replacement.  The risk and benefits of the procedure discussed with the patient including not limited to infection or vessel damage incomplete pain relief as well as incomplete restoration of function.  Patient understands the risk and benefits as well as the grueling nature of the recovery.  All questions answered.  This may be cemented or press-fit.  The patella is very thin and we will have to be mindful of patellar thickness when considering our initial cuts as well as fixation.  Follow-Up Instructions: No follow-ups on file.   Orders:  No orders  of the defined types were placed in this encounter.  No orders of the defined types were placed in this encounter.     Procedures: No procedures performed   Clinical Data: No additional findings.  Objective: Vital Signs: There were no vitals taken for this visit.  Physical Exam:  Constitutional: Patient appears well-developed HEENT:  Head: Normocephalic Eyes:EOM are normal Neck: Normal range of motion Cardiovascular: Normal rate Pulmonary/chest: Effort normal Neurologic: Patient is alert Skin: Skin is warm Psychiatric: Patient has normal mood and affect  Ortho Exam: Ortho exam demonstrates mild effusion in the right knee.  Normal alignment.  Pedal pulses palpable.  Ankle dorsiflexion intact.  Has patellofemoral crepitus.  Both medial and lateral joint line tenderness is present.  Range of motion is about 0-1 10.  No groin pain on the right with internal/external rotation of the leg.  Specialty Comments:  No specialty comments available.  Imaging: No results found.   PMFS History: Patient Active Problem List   Diagnosis Date Noted   Pulmonary hypertension, unspecified ([place]) - suggested by CT 12/27/2021   [place] coronary artery calcium score greater than 400 12/10/2021   Mixed dyslipidemia 12/10/2021   Family history of premature coronary artery disease: Father & Brother 12/10/2021   Nonspecific abnormal electrocardiogram (ECG) (EKG) 12/10/2021   Left main coronary artery disease  12/10/2021   Exercise intolerance - CRO Atypical Angina 12/10/2021   S/P hernia repair 10/08/2017   Chronic pain of both knees 48/88/9169   Folliculitis 45/01/8881   SBO (small bowel obstruction) ([place]) 01/10/2017   Post-operative complication 80/01/4916   Severe sepsis ([place]) 12/25/2016   Flu-like symptoms 12/25/2016   Dehydration with hyponatremia 12/25/2016   AKI (acute kidney injury) ([facility]) 12/25/2016   CKD (chronic kidney disease) stage 3, GFR 30-59 ml/min ([facility]) 12/25/2016   S/P  repair of ventral hernia 12/25/2016   Hypothyroidism 12/25/2016   Essential hypertension 12/25/2016   Neoplasm of left kidney 04/18/2016   Postoperative examination 03/07/2016   Strangulated inguinal hernia 02/29/2016   Past Medical History:  Diagnosis Date   Anxiety    Arthritis    BPH (benign prostatic hyperplasia)    Cataracts, bilateral    Chronic back pain    Followed by Dr. Arnoldo Morale   CKD (chronic kidney disease) stage 3, GFR 30-59 ml/min ([place]) 09/2021   Creatinine 9.15   Complication of anesthesia    unable to void after surgery   Coronary artery disease 10/31/2021   Coronary Calcium Score 535   GERD (gastroesophageal reflux disease)    History of hiatal hernia    History of kidney surgery    left kidney removed due to cancer 2017   History of thyroid cancer 1998   Thyroidectomy-now on Synthroid   Hypertension    Hypothyroidism (acquired) 1998   Post thyroidectomy for thyroid cancer   Macular degeneration of both eyes    dry   Mixed dyslipidemia 09/27/2021   Low HDL, high triglycerides with normal LDL: TC 117, TG 135, HDL 25, LDL 56.   PONV (postoperative nausea and vomiting)    Renal cell carcinoma, left ([place]) 2017   Left nephrectomy-Dr. Sherilyn Banker, complicated by multiple different hernia surgeries.    Family History  Problem Relation Age of Onset   Dementia Mother    Coronary artery disease Father 32       Longstanding smoker   Heart attack Father 67       Second MI was at 91   Hypertension Father    Hyperlipidemia Father    Heart attack Brother 43       Sudden cardiac death from MI   Healthy Son     Past Surgical History:  Procedure Laterality Date   APPENDECTOMY     COLONOSCOPY     CYSTOSCOPY WITH RETROGRADE PYELOGRAM, URETEROSCOPY AND STENT PLACEMENT Left 04/01/2016   Procedure: CYSTOSCOPY WITH RETROGRADE PYELOGRAM, left ureter;  Surgeon: Raynelle Bring, MD;  Location: [facility];  Service: Urology;  Laterality: Left;   HEMORRHOID SURGERY     HERNIA  REPAIR     inguinal-3 on left , 1 on right   INCISIONAL HERNIA REPAIR N/A 12/23/2016   Procedure: LAPAROSCOPIC VERSUS OPEN LYSIS OF ADHESIONS AND INCISIONAL HERNIA REPAIR WITH MESH;  Surgeon: Ralene Ok, MD;  Location: [facility];  Service: General;  Laterality: N/A;   [place]  10/08/2017   OPEN   INCISIONAL HERNIA REPAIR N/A 10/08/2017   Procedure: [place];  Surgeon: Ralene Ok, MD;  Location: [facility];  Service: General;  Laterality: N/A;   INSERTION OF MESH N/A 12/23/2016   Procedure: INSERTION OF MESH;  Surgeon: Ralene Ok, MD;  Location: [facility];  Service: General;  Laterality: N/A;   INSERTION OF MESH N/A 01/10/2017   Procedure: INSERTION OF MESH;  Surgeon: Ralene Ok, MD;  [facility];  Service: General;  Laterality: N/A;   INSERTION OF MESH N/A 10/08/2017   Procedure: INSERTION OF MESH;  Surgeon: Ralene Ok, MD;  Location: [facility];  Service: General;  Laterality: N/A;   LAPAROSCOPIC NEPHRECTOMY Left 04/18/2016   Procedure: LAPAROSCOPIC RADICAL  LEFT NEPHRECTOMY;  Surgeon: Raynelle Bring, MD;  Location: [facility];  Service: Urology;  Laterality: Left;   LAPAROSCOPY N/A 01/10/2017   Procedure: LAPAROSCOPY DIAGNOSTIC , POSSIBLE LAPAROTOMY;  Surgeon: Ralene Ok, MD;  Location: [facility];  Service: General;  Laterality: N/A;   NASAL SINUS SURGERY     left side   NASAL SINUS SURGERY Left 10/25/2019   Procedure: ENDOSCOPIC SINUS SURGERY/ethmoid and maxillary;  Surgeon: Izora Gala, MD;  Location: [facility];  Service: ENT;  Laterality: Left;   THYROIDECTOMY, PARTIAL  1998   left small amount on left side-for cancer   TONSILLECTOMY     Social History   Occupational History   Not on file  Tobacco Use   Smoking status: Never   Smokeless tobacco: Never  Vaping Use   Vaping Use: Never used  Substance and Sexual Activity   Alcohol use: No   Drug use: No   Sexual activity: Not on file

## 2022-10-07 ENCOUNTER — Ambulatory Visit: Payer: PPO | Admitting: Orthopedic Surgery

## 2022-10-08 DIAGNOSIS — R519 Headache, unspecified: Secondary | ICD-10-CM | POA: Diagnosis not present

## 2022-10-08 DIAGNOSIS — R7303 Prediabetes: Secondary | ICD-10-CM | POA: Diagnosis not present

## 2022-10-08 DIAGNOSIS — E032 Hypothyroidism due to medicaments and other exogenous substances: Secondary | ICD-10-CM | POA: Diagnosis not present

## 2022-10-08 DIAGNOSIS — N1831 Chronic kidney disease, stage 3a: Secondary | ICD-10-CM | POA: Diagnosis not present

## 2022-10-08 DIAGNOSIS — N400 Enlarged prostate without lower urinary tract symptoms: Secondary | ICD-10-CM | POA: Diagnosis not present

## 2022-10-08 DIAGNOSIS — D692 Other nonthrombocytopenic purpura: Secondary | ICD-10-CM | POA: Diagnosis not present

## 2022-10-08 DIAGNOSIS — Z0000 Encounter for general adult medical examination without abnormal findings: Secondary | ICD-10-CM | POA: Diagnosis not present

## 2022-10-08 DIAGNOSIS — K219 Gastro-esophageal reflux disease without esophagitis: Secondary | ICD-10-CM | POA: Diagnosis not present

## 2022-10-08 DIAGNOSIS — I2510 Atherosclerotic heart disease of native coronary artery without angina pectoris: Secondary | ICD-10-CM | POA: Diagnosis not present

## 2022-10-08 DIAGNOSIS — D696 Thrombocytopenia, unspecified: Secondary | ICD-10-CM | POA: Diagnosis not present

## 2022-10-08 DIAGNOSIS — I10 Essential (primary) hypertension: Secondary | ICD-10-CM | POA: Diagnosis not present

## 2022-10-08 DIAGNOSIS — F419 Anxiety disorder, unspecified: Secondary | ICD-10-CM | POA: Diagnosis not present

## 2022-10-21 ENCOUNTER — Other Ambulatory Visit: Payer: Self-pay

## 2022-11-01 NOTE — Progress Notes (Signed)
Surgical Instructions    Your procedure is scheduled on Tuesday, January 9th, 2024.   Report to [facility] Main Entrance "A" at 07:30 A.M., then check in with the Admitting office.  Call this number if you have problems the morning of surgery:  [phone]   If you have any questions prior to your surgery date call [phone]: Open Monday-Friday 8am-4pm If you experience any cold or flu symptoms such as cough, fever, chills, shortness of breath, etc. between now and your scheduled surgery, please notify us at the above number     Remember:  Do not eat after midnight the night before your surgery  You may drink clear liquids until 06:30 the morning of your surgery.   Clear liquids allowed are: Water, Non-Citrus Juices (without pulp), Carbonated Beverages, Clear Tea, Black Coffee ONLY (NO MILK, CREAM OR POWDERED CREAMER of any kind), and Gatorade  Patient Instructions  The night before surgery:  No food after midnight. ONLY clear liquids after midnight  The day of surgery (if you do NOT have diabetes):  Drink ONE (1) Pre-Surgery Clear Ensure by 06:30 the morning of surgery. Drink in one sitting. Do not sip.  This drink was given to you during your hospital  pre-op appointment visit.  Nothing else to drink after completing the  Pre-Surgery Clear Ensure.         If you have questions, please contact your surgeon's office.     Take these medicines the morning of surgery with A SIP OF WATER:   ezetimibe (ZETIA)  famotidine (PEPCID)  levothyroxine (SYNTHROID)  omeprazole (PRILOSEC)  PARoxetine (PAXIL-CR)  dutasteride (AVODART)  alfuzosin (UROXATRAL)   Follow your surgeon's instructions on when to stop Aspirin.  If no instructions were given by your surgeon then you will need to call the office to get those instructions.     As of today, STOP taking any Aspirin (unless otherwise instructed by your surgeon) Aleve, Naproxen, Ibuprofen, Motrin, Advil, Goody's, BC's, all herbal  medications, fish oil, and all vitamins.   The day of surgery:          Do not wear jewelry  Do not wear lotions, powders, cologne or deodorant. Men may shave face and neck. Do not bring valuables to the hospital.  [facility] is not responsible for any belongings or valuables.    Do NOT Smoke (Tobacco/Vaping)  24 hours prior to your procedure  If you use a CPAP at night, you may bring your mask for your overnight stay.   Contacts, glasses, hearing aids, dentures or partials may not be worn into surgery, please bring cases for these belongings   For patients admitted to the hospital, discharge time will be determined by your treatment team.   Patients discharged the day of surgery will not be allowed to drive home, and someone needs to stay with them for 24 hours.   SURGICAL WAITING ROOM VISITATION Patients having surgery or a procedure may have no more than 2 support people in the waiting area - these visitors may rotate.   Children under the age of 65 must have an adult with them who is not the patient. If the patient needs to stay at the hospital during part of their recovery, the visitor guidelines for inpatient rooms apply. Pre-op nurse will coordinate an appropriate time for 1 support person to accompany patient in pre-op.  This support person may not rotate.   Please refer to [email or web address] for the visitor guidelines for Inpatients (after your surgery  is over and you are in a regular room).    Special instructions:    Oral Hygiene is also important to reduce your risk of infection.  Remember - BRUSH YOUR TEETH THE MORNING OF SURGERY WITH YOUR REGULAR TOOTHPASTE   [facility]- Preparing For Surgery  Before surgery, you can play an important role. Because skin is not sterile, your skin needs to be as free of germs as possible. You can reduce the number of germs on your skin by washing with CHG (chlorahexidine gluconate)  Soap before surgery.  CHG is an antiseptic cleaner which kills germs and bonds with the skin to continue killing germs even after washing.     Please do not use if you have an allergy to CHG or antibacterial soaps. If your skin becomes reddened/irritated stop using the CHG.  Do not shave (including legs and underarms) for at least 48 hours prior to first CHG shower. It is OK to shave your face.  Please follow these instructions carefully.     Shower the NIGHT BEFORE SURGERY and the MORNING OF SURGERY with CHG Soap.   If you chose to wash your hair, wash your hair first as usual with your normal shampoo. After you shampoo, rinse your hair and body thoroughly to remove the shampoo.  Then [employer] and genitals (private parts) with your normal soap and rinse thoroughly to remove soap.  After that Use CHG Soap as you would any other liquid soap. You can apply CHG directly to the skin and wash gently with a scrungie or a clean washcloth.   Apply the CHG Soap to your body ONLY FROM THE NECK DOWN.  Do not use on open wounds or open sores. Avoid contact with your eyes, ears, mouth and genitals (private parts). Wash Face and genitals (private parts)  with your normal soap.   Wash thoroughly, paying special attention to the area where your surgery will be performed.  Thoroughly rinse your body with warm water from the neck down.  DO NOT shower/wash with your normal soap after using and rinsing off the CHG Soap.  Pat yourself dry with a CLEAN TOWEL.  Wear CLEAN PAJAMAS to bed the night before surgery  Place CLEAN SHEETS on your bed the night before your surgery  DO NOT SLEEP WITH PETS.   Day of Surgery:  Take a shower with CHG soap. Wear Clean/Comfortable clothing the morning of surgery Do not apply any deodorants/lotions.   Remember to brush your teeth WITH YOUR REGULAR TOOTHPASTE.    If you received a COVID test during your pre-op visit, it is requested that you wear a mask when out in  public, stay away from anyone that may not be feeling well, and notify your surgeon if you develop symptoms. If you have been in contact with anyone that has tested positive in the last 10 days, please notify your surgeon.    Please read over the following fact sheets that you were given.

## 2022-11-05 ENCOUNTER — Encounter (HOSPITAL_COMMUNITY)
Admission: RE | Admit: 2022-11-05 | Discharge: 2022-11-05 | Disposition: A | Discharge status: Home / Self Care | Payer: PPO | Source: Ambulatory Visit | Attending: Orthopedic Surgery | Admitting: Orthopedic Surgery

## 2022-11-05 ENCOUNTER — Other Ambulatory Visit: Payer: Self-pay

## 2022-11-05 ENCOUNTER — Encounter (HOSPITAL_COMMUNITY): Payer: Self-pay

## 2022-11-05 VITALS — BP 127/73 | HR 63 | Temp 97.5°F | Resp 18 | Ht 75.0 in | Wt 205.0 lb

## 2022-11-05 DIAGNOSIS — R001 Bradycardia, unspecified: Secondary | ICD-10-CM | POA: Insufficient documentation

## 2022-11-05 DIAGNOSIS — Z01818 Encounter for other preprocedural examination: Secondary | ICD-10-CM | POA: Insufficient documentation

## 2022-11-05 LAB — CBC
HCT: 40.2 % (ref 39.0–52.0)
Hemoglobin: 12.3 g/dL — ABNORMAL LOW (ref 13.0–17.0)
MCH: 29.9 pg (ref 26.0–34.0)
MCHC: 30.6 g/dL (ref 30.0–36.0)
MCV: 97.8 fL (ref 80.0–100.0)
Platelets: 122 10*3/uL — ABNORMAL LOW (ref 150–400)
RBC: 4.11 MIL/uL — ABNORMAL LOW (ref 4.22–5.81)
RDW: 14.7 % (ref 11.5–15.5)
WBC: 6.7 10*3/uL (ref 4.0–10.5)
nRBC: 0 % (ref 0.0–0.2)

## 2022-11-05 LAB — BASIC METABOLIC PANEL
Anion gap: 7 (ref 5–15)
BUN: 12 mg/dL (ref 8–23)
CO2: 25 mmol/L (ref 22–32)
Calcium: 8.6 mg/dL — ABNORMAL LOW (ref 8.9–10.3)
Chloride: 110 mmol/L (ref 98–111)
Creatinine, Ser: 1.32 mg/dL — ABNORMAL HIGH (ref 0.61–1.24)
GFR, Estimated: 56 mL/min — ABNORMAL LOW (ref >60)
Glucose, Bld: 123 mg/dL — ABNORMAL HIGH (ref 70–99)
Potassium: 4.2 mmol/L (ref 3.5–5.1)
Sodium: 142 mmol/L (ref 135–145)

## 2022-11-05 LAB — URINALYSIS, COMPLETE (UACMP) WITH MICROSCOPIC
Bacteria, UA: NONE SEEN
Bilirubin Urine: NEGATIVE
Glucose, UA: NEGATIVE mg/dL
Hgb urine dipstick: NEGATIVE
Ketones, ur: NEGATIVE mg/dL
Nitrite: NEGATIVE
Protein, ur: NEGATIVE mg/dL
Specific Gravity, Urine: 1.019 (ref 1.005–1.030)
pH: 5 (ref 5.0–8.0)

## 2022-11-05 LAB — SURGICAL PCR SCREEN
MRSA, PCR: NEGATIVE
Staphylococcus aureus: NEGATIVE

## 2022-11-05 NOTE — Progress Notes (Addendum)
PCP - Deland Pretty, MD Cardiologist - Glenetta Hew, MD  PPM/ICD - denies Device Orders - n/a Rep Notified - n/a  Chest x-ray - n/a EKG - 11/06/22 Stress Test - more than 10 years ago - negative per patient ECHO - 01/14/22 Cardiac Cath - denies  Sleep Study - denies CPAP - n/a  Fasting Blood Sugar - n/a  Blood Thinner Instructions: n/a Aspirin Instructions: Patient was instructed to call MD ad ask if he must hold Aspirin prior to his surgery. Patient and his wife vebalized understanding.    ERAS Protcol - yes PRE-SURGERY Ensure - yes, until 06:30 o'clock  COVID TEST- n/a   Anesthesia review: yes - cardiac history  Patient denies shortness of breath, fever, cough and chest pain at PAT appointment   All instructions explained to the patient, with a verbal understanding of the material. Patient agrees to go over the instructions while at home for a better understanding. Patient also instructed to self quarantine after being tested for COVID-19. The opportunity to ask questions was provided.

## 2022-11-05 NOTE — Progress Notes (Addendum)
PAT this AM: UA - Leukocytes - trace; Magnant, Charles, PA-C was notified; also, Dr. Marlou Sa was informed via [facility].

## 2022-11-06 ENCOUNTER — Telehealth: Payer: Self-pay | Admitting: Cardiology

## 2022-11-06 ENCOUNTER — Telehealth: Payer: Self-pay | Admitting: *Deleted

## 2022-11-06 ENCOUNTER — Telehealth: Payer: Self-pay | Admitting: Orthopedic Surgery

## 2022-11-06 LAB — URINE CULTURE: Culture: NO GROWTH

## 2022-11-06 NOTE — Telephone Encounter (Signed)
Patient scheduled 11-15-21 mec REC and Consent complete

## 2022-11-06 NOTE — Telephone Encounter (Signed)
I called patient. He states that he was told to call you and see if he should stop taking alfuzosin and dutasteride prior to surgery. He states that one of these is used to shrink the prostate and the other is to help pass water.   Please advise.

## 2022-11-06 NOTE — Telephone Encounter (Signed)
Patient was returning phone call about procedure instructions

## 2022-11-06 NOTE — Telephone Encounter (Signed)
Wife called stating they got the information for surgery from the hospital today. There were 2 medications they were unsure if he should stop taking prior to surgery and told her to reach out to our office. Wife is requesting a call back in regards to this.  Also would like clarification on when to stop aspirin.

## 2022-11-06 NOTE — Telephone Encounter (Signed)
   Name: Eric Suarez  DOB: October 21, 1946  MRN: [id number]  Primary Cardiologist: Glenetta Hew, MD   Preoperative team, please contact this patient and set up a phone call appointment for further preoperative risk assessment. Please obtain consent and complete medication review. Thank you for your help.  I confirm that guidance regarding antiplatelet and oral anticoagulation therapy has been completed and, if necessary, noted below (none requested). Pt does take ASA 81 mg daily, if necessary, he may hold ASA 5-7 days prior to procedure.    Trudi Ida, NP 11/06/2022, 10:49 AM [place]

## 2022-11-06 NOTE — Telephone Encounter (Signed)
   Pre-operative Risk Assessment    Patient Name: Eric Suarez  DOB: Feb 14, 1946 MRN: [id number]      Request for Surgical Clearance    Procedure:   RIGHT TOTAL KNEE ARTHROPLASTY  Date of Surgery:  Clearance 11/19/22                                 Surgeon:  Meredith Pel, MD  Surgeon's Group or Practice Name: [facility] Phone number:  [phone] Fax number:  [phone]   Type of Clearance Requested:   - Medical    Type of Anesthesia:  Spinal   Additional requests/questions:    SignedTrilby Drummer   11/06/2022, 8:42 AM

## 2022-11-06 NOTE — Telephone Encounter (Signed)
Patient scheduled 11-15-21  mec REC and Consent complete   Patient Consent for Virtual Visit        ESPEN BETHEL has provided verbal consent on 11/06/2022 for a virtual visit (video or telephone).   CONSENT FOR VIRTUAL VISIT FOR:  Camelia Eng  By participating in this virtual visit I agree to the following:  I hereby voluntarily request, consent and authorize [facility] and its employed or contracted physicians, physician assistants, nurse practitioners or other licensed health care professionals (the Practitioner), to provide me with telemedicine health care services (the "Services") as deemed necessary by the treating Practitioner. I acknowledge and consent to receive the Services by the Practitioner via telemedicine. I understand that the telemedicine visit will involve communicating with the Practitioner through live audiovisual communication technology and the disclosure of certain medical information by electronic transmission. I acknowledge that I have been given the opportunity to request an in-person assessment or other available alternative prior to the telemedicine visit and am voluntarily participating in the telemedicine visit.  I understand that I have the right to withhold or withdraw my consent to the use of telemedicine in the course of my care at any time, without affecting my right to future care or treatment, and that the Practitioner or I may terminate the telemedicine visit at any time. I understand that I have the right to inspect all information obtained and/or recorded in the course of the telemedicine visit and may receive copies of available information for a reasonable fee.  I understand that some of the potential risks of receiving the Services via telemedicine include:  Delay or interruption in medical evaluation due to technological equipment failure or disruption; Information transmitted may not be sufficient (e.g. poor resolution of images) to allow  for appropriate medical decision making by the Practitioner; and/or  In rare instances, security protocols could fail, causing a breach of personal health information.  Furthermore, I acknowledge that it is my responsibility to provide information about my medical history, conditions and care that is complete and accurate to the best of my ability. I acknowledge that Practitioner's advice, recommendations, and/or decision may be based on factors not within their control, such as incomplete or inaccurate data provided by me or distortions of diagnostic images or specimens that may result from electronic transmissions. I understand that the practice of medicine is not an exact science and that Practitioner makes no warranties or guarantees regarding treatment outcomes. I acknowledge that a copy of this consent can be made available to me via my patient portal ([facility]), or I can request a printed copy by calling the office of [facility].    I understand that my insurance will be billed for this visit.   I have read or had this consent read to me. I understand the contents of this consent, which adequately explains the benefits and risks of the Services being provided via telemedicine.  I have been provided ample opportunity to ask questions regarding this consent and the Services and have had my questions answered to my satisfaction. I give my informed consent for the services to be provided through the use of telemedicine in my medical care

## 2022-11-13 ENCOUNTER — Telehealth: Payer: Self-pay | Admitting: *Deleted

## 2022-11-13 NOTE — Telephone Encounter (Signed)
[facility] pre-op call completed. 

## 2022-11-13 NOTE — Care Plan (Signed)
[facility] RNCM call to patient to discuss his upcoming Right total knee arthroplasty with Dr. Marlou Sa on 11/19/22. He is an [facility] patient through [facility] and is agreeable to case management. He lives with his spouse, who will be assisting after surgery. He has a RW and will need a home CPM. Referral made to [facility] so this DME will be delivered to his home prior to surgery. Anticipate HHPT will be needed after short stay in hospital. Referral made to [facility] after choice provided. Reviewed all post op care instructions. Will continue to follow for needs.

## 2022-11-13 NOTE — Telephone Encounter (Signed)
Sheri took care of this

## 2022-11-14 NOTE — Telephone Encounter (Signed)
noted 

## 2022-11-15 ENCOUNTER — Encounter: Payer: Self-pay | Admitting: Nurse Practitioner

## 2022-11-15 ENCOUNTER — Ambulatory Visit: Payer: PPO | Attending: Cardiology | Admitting: Nurse Practitioner

## 2022-11-15 DIAGNOSIS — Z01810 Encounter for preprocedural cardiovascular examination: Secondary | ICD-10-CM

## 2022-11-15 NOTE — Progress Notes (Signed)
Anesthesia Chart Review:  Follows with cardiology for history of HLD, HTN, nonobstructive CAD.  Seen by Ambrose Pancoast, NP 11/15/2021 for preop evaluation.  Per note, "Eric Suarez's perioperative risk of a major cardiac event is 0.4% according to the Revised Cardiac Risk Index (RCRI).  Therefore, he is at low risk for perioperative complications.   His functional capacity is fair at 4.31 METs according to the Duke Activity Status Index (DASI). Recommendations: According to ACC/AHA guidelines, no further cardiovascular testing needed.  The patient may proceed to surgery at acceptable risk.  Antiplatelet and/or Anticoagulation Recommendations: Aspirin can be held for 5-7 days prior to his surgery.  Please resume Aspirin post operatively when it is felt to be safe from a bleeding standpoint."  History of CKD 3 and renal carcinoma, s/p left nephrectomy 2017.  History of hiatal hernia.  Preop labs reviewed, creatinine mildly elevated at 1.32, mild anemia with Hgb 12.3, mild thrombocytopenia with platelets 122.  EKG 11/05/22: Sinus brady. Rate 58.   TTE 01/14/22:  1. Global longitudinal strain is -22.2%. Left ventricular ejection  fraction, by estimation, is 65 to 70%. The left ventricle has normal  function. The left ventricle has no regional wall motion abnormalities.  Left ventricular diastolic parameters are  indeterminate.   2. Right ventricular systolic function is normal. The right ventricular  size is normal.   3. Systolic anterior motion of the mitral chordae. . Trivial mitral valve  regurgitation.   4. The aortic valve is tricuspid. Aortic valve regurgitation is not  visualized.   5. Aortic dilatation noted. There is mild dilatation of the ascending  aorta, measuring 39 mm.   Coronary CT 12/26/21: IMPRESSION: 1. Coronary calcium score of 536. This was 64th percentile for age and sex matched control.   2.  Normal coronary origin with left dominance.   3.  Nonobstructive CAD   4.   Calcified plaque in left main causes mild (25-49%) stenosis   5.  Noncalcified plaque in D1 causes mild (25-49%) stenosis   6. Calcified plaque in proximal LAD and proximal LCX causes minimal (0-24%) stenosis   7. Dilated main pulmonary artery measuring 48m and RV appears moderately dilated. Recommend echocardiogram for further evaluation   CAD-RADS 2. Mild non-obstructive CAD (25-49%). Consider non-atherosclerotic causes of chest pain. Consider preventive therapy and risk factor modification.    JWynonia MustyMChristus Mother Frances Hospital - TylerShort Stay Center/Anesthesiology Phone [phone]/03/2023 12:56 PM

## 2022-11-15 NOTE — Anesthesia Preprocedure Evaluation (Addendum)
Anesthesia Evaluation  Patient identified by MRN, date of birth, ID band Patient awake    Reviewed: Allergy & Precautions, H&P , NPO status , Patient's Chart, lab work & pertinent test results  History of Anesthesia Complications (+) PONV and history of anesthetic complications  Airway Mallampati: II   Neck ROM: full    Dental   Pulmonary neg pulmonary ROS   breath sounds clear to auscultation       Cardiovascular hypertension, + CAD   Rhythm:regular Rate:Normal     Neuro/Psych   Anxiety        GI/Hepatic hiatal hernia,GERD  ,,  Endo/Other  Hypothyroidism    Renal/GU Renal InsufficiencyRenal diseaseLeft nephrectomy     Musculoskeletal  (+) Arthritis ,    Abdominal   Peds  Hematology   Anesthesia Other Findings   Reproductive/Obstetrics                             Anesthesia Physical Anesthesia Plan  ASA: 3  Anesthesia Plan: MAC and Spinal   Post-op Pain Management: Regional block*   Induction: Intravenous  PONV Risk Score and Plan: 2 and Ondansetron, Propofol infusion, Midazolam and Treatment may vary due to age or medical condition  Airway Management Planned: Simple Face Mask  Additional Equipment:   Intra-op Plan:   Post-operative Plan:   Informed Consent: I have reviewed the patients History and Physical, chart, labs and discussed the procedure including the risks, benefits and alternatives for the proposed anesthesia with the patient or authorized representative who has indicated his/her understanding and acceptance.     Dental advisory given  Plan Discussed with: CRNA, Anesthesiologist and Surgeon  Anesthesia Plan Comments: (PAT note by Antionette Poles, PA-C:  Follows with cardiology for history of HLD, HTN, nonobstructive CAD.  Seen by [name], NP 11/15/2021 for preop evaluation.  Per note, "Mr.Erhard's perioperative risk of a major cardiac event is0.4% according  to the Revised Cardiac Risk Index (RCRI). Therefore, heis atlowrisk for perioperative complications. Hisfunctional capacity is fairat 4.31METs according to the Duke Activity Status Index (DASI). Recommendations: According to ACC/AHA guidelines, no further cardiovascular testing needed. The patient may proceed to surgery at acceptable risk. Antiplatelet and/or Anticoagulation Recommendations: Aspirin can be held for5-7days prior to hissurgery.Please resume Aspirin post operatively when it is felt to be safe from a bleeding standpoint."  History of CKD 3 and renal carcinoma, s/p left nephrectomy 2017.  History of hiatal hernia.  Preop labs reviewed, creatinine mildly elevated at 1.32, mild anemia with Hgb 12.3, mild thrombocytopenia with platelets 122.  EKG 11/05/22: Sinus brady. Rate 58.   TTE 01/14/22: 1. Global longitudinal strain is -22.2%. Left ventricular ejection  fraction, by estimation, is 65 to 70%. The left ventricle has normal  function. The left ventricle has no regional wall motion abnormalities.  Left ventricular diastolic parameters are  indeterminate.  2. Right ventricular systolic function is normal. The right ventricular  size is normal.  3. Systolic anterior motion of the mitral chordae. . Trivial mitral valve  regurgitation.  4. The aortic valve is tricuspid. Aortic valve regurgitation is not  visualized.  5. Aortic dilatation noted. There is mild dilatation of the ascending  aorta, measuring 39 mm.   Coronary CT 12/26/21: IMPRESSION: 1. Coronary calcium score of 536. This was 64th percentile for age and sex matched control.  2. Normal coronary origin with left dominance.  3. Nonobstructive CAD  4. Calcified plaque in left main causes  mild (25-49%) stenosis  5. Noncalcified plaque in D1 causes mild (25-49%) stenosis  6. Calcified plaque in proximal LAD and proximal LCX causes minimal (0-24%) stenosis  7. Dilated main pulmonary  artery measuring 30mm and RV appears moderately dilated. Recommend echocardiogram for further evaluation  CAD-RADS 2. Mild non-obstructive CAD (25-49%). Consider non-atherosclerotic causes of chest pain. Consider preventive therapy and risk factor modification.  )        Anesthesia Quick Evaluation

## 2022-11-15 NOTE — Progress Notes (Signed)
Virtual Visit via Telephone Note   Because of EIN RIJO co-morbid illnesses, he is at least at moderate risk for complications without adequate follow up.  This format is felt to be most appropriate for this patient at this time.  The patient did not have access to video technology/had technical difficulties with video requiring transitioning to audio format only (telephone).  All issues noted in this document were discussed and addressed.  No physical exam could be performed with this format.  Please refer to the patient's chart for his consent to telehealth for [facility].  Evaluation Performed:  Preoperative cardiovascular risk assessment _____________   Date:  11/15/2022   Patient ID:  Eric Suarez, DOB August 19, 1946, MRN [id number] Patient Location:  Home Provider location:   Office  Primary Care Provider:  Deland Pretty, MD Primary Cardiologist:  Glenetta Hew, MD  Chief Complaint / Patient Profile   77 y.o. y/o Eric with a h/o HLD, HTN, nonobstructive CAD, pulmonary hypertension who is pending right total knee arthroplasty and presents today for telephonic preoperative cardiovascular risk assessment.  History of Present Illness    Eric Suarez is a 77 y.o. Eric who presents via audio/video conferencing for a telehealth visit today.  Pt was last seen in cardiology clinic on 03/04/22 by Dr. Ellyn Hack.  At that time Eric Suarez was doing well . The patient is now pending procedure as outlined above. Since his last visit, he reports that he has not had any cardiac complaints are changes.   Past Medical History    Past Medical History:  Diagnosis Date   Anxiety    Arthritis    BPH (benign prostatic hyperplasia)    Cataracts, bilateral    Chronic back pain    Followed by Dr. Arnoldo Morale   CKD (chronic kidney disease) stage 3, GFR 30-59 ml/min ([place]) 09/2021   Creatinine 1.85   Complication of anesthesia    unable to void after surgery   Coronary artery  disease 10/31/2021   Coronary Calcium Score 535   GERD (gastroesophageal reflux disease)    History of hiatal hernia    History of kidney surgery    left kidney removed due to cancer 2017   History of thyroid cancer 1998   Thyroidectomy-now on Synthroid   Hypertension    Hypothyroidism (acquired) 1998   Post thyroidectomy for thyroid cancer   Macular degeneration of both eyes    dry   Mixed dyslipidemia 09/27/2021   Low HDL, high triglycerides with normal LDL: TC 117, TG 135, HDL 25, LDL 56.   PONV (postoperative nausea and vomiting)    Renal cell carcinoma, left ([place]) 2017   Left nephrectomy-Dr. Sherilyn Banker, complicated by multiple different hernia surgeries.   Past Surgical History:  Procedure Laterality Date   APPENDECTOMY     COLONOSCOPY     CYSTOSCOPY WITH RETROGRADE PYELOGRAM, URETEROSCOPY AND STENT PLACEMENT Left 04/01/2016   Procedure: CYSTOSCOPY WITH RETROGRADE PYELOGRAM, left ureter;  Surgeon: Raynelle Bring, MD;  Location: [facility];  Service: Urology;  Laterality: Left;   HEMORRHOID SURGERY     HERNIA REPAIR     inguinal-3 on left , 1 on right   INCISIONAL HERNIA REPAIR N/A 12/23/2016   Procedure: LAPAROSCOPIC VERSUS OPEN LYSIS OF ADHESIONS AND INCISIONAL HERNIA REPAIR WITH MESH;  Surgeon: Ralene Ok, MD;  Location: [place];  Service: General;  Laterality: N/A;   [place]  10/08/2017   OPEN   INCISIONAL HERNIA REPAIR N/A 10/08/2017  Procedure: OPEN HERNIA REPAIR INCISIONAL ERAS PATHWAY;  Surgeon: Ralene Ok, MD;  Location: [place];  Service: General;  Laterality: N/A;   INSERTION OF MESH N/A 12/23/2016   Procedure: INSERTION OF MESH;  Surgeon: Ralene Ok, MD;  Location: [place];  Service: General;  Laterality: N/A;   INSERTION OF MESH N/A 01/10/2017   Procedure: INSERTION OF MESH;  Surgeon: Ralene Ok, MD;  Location: [place];  Service: General;  Laterality: N/A;   INSERTION OF MESH N/A 10/08/2017   Procedure: INSERTION OF MESH;  Surgeon:  Ralene Ok, MD;  Location: [place];  Service: General;  Laterality: N/A;   LAPAROSCOPIC NEPHRECTOMY Left 04/18/2016   Procedure: LAPAROSCOPIC RADICAL  LEFT NEPHRECTOMY;  Surgeon: Raynelle Bring, MD;  Location: [facility];  Service: Urology;  Laterality: Left;   LAPAROSCOPY N/A 01/10/2017   Procedure: LAPAROSCOPY DIAGNOSTIC , POSSIBLE LAPAROTOMY;  Surgeon: Ralene Ok, MD;  Location: [place];  Service: General;  Laterality: N/A;   NASAL SINUS SURGERY     left side   NASAL SINUS SURGERY Left 10/25/2019   Procedure: ENDOSCOPIC SINUS SURGERY/ethmoid and maxillary;  Surgeon: Izora Gala, MD;  Location: [place];  Service: ENT;  Laterality: Left;   THYROIDECTOMY, PARTIAL  1998   left small amount on left side-for cancer   TONSILLECTOMY      Allergies  Allergies  Allergen Reactions   Tamsulosin Nausea And Vomiting and Other (See Comments)    Uncomfortable in pelvic region   Sulfa Antibiotics Rash    Home Medications    Prior to Admission medications   Medication Sig Start Date End Date Taking? Authorizing Provider  alfuzosin (UROXATRAL) 10 MG 24 hr tablet Take 10 mg daily by mouth.  12/30/16   [provider]  Ascorbic Acid (VITAMIN C) 500 MG CHEW 1 tablet    [provider]  aspirin 81 MG chewable tablet 1 tablet    [provider]  atenolol-chlorthalidone (TENORETIC) 50-25 MG tablet Take 1 tablet by mouth daily. Patient only takes 1/2 tablet    [provider]  Cholecalciferol (VITAMIN D3) 25 MCG (1000 UT) CAPS 1 capsule    [provider]  dutasteride (AVODART) 0.5 MG capsule Take 0.5 mg daily by mouth.    [provider]  ezetimibe (ZETIA) 10 MG tablet 1 tablet 12/12/21   [provider]  gabapentin (NEURONTIN) 300 MG capsule Take 300 mg by mouth 2 (two) times daily.    [provider]  levothyroxine (SYNTHROID) 125 MCG tablet Take 125 mcg by mouth daily before breakfast.    [provider]  Multiple Vitamins-Minerals (EYE VITAMINS PO) Take by mouth.    [provider]  nitroGLYCERIN (NITROSTAT) 0.4 MG SL tablet Place 0.4 mg under the tongue every 5 (five) minutes as needed.    [provider]  omeprazole (PRILOSEC) 20 MG capsule Take 20 mg by mouth daily.    [provider]  OVER THE COUNTER MEDICATION Take 2 tablets daily by mouth. Cognium - Brain Vitamin    [provider]  PARoxetine (PAXIL-CR) 25 MG 24 hr tablet Take 25 mg by mouth daily. 02/21/16   [provider]  potassium chloride SA (K-DUR,KLOR-CON) 20 MEQ tablet Take 20 mEq by mouth daily.    [provider]  vitamin C (ASCORBIC ACID) 500 MG tablet Take 500 mg by mouth daily.    [provider]  Zinc 100 MG TABS 1 tablet    [provider]  Zinc  Sulfate (ZINC 15 PO) Take by mouth.    [provider]    Physical Exam    Vital Signs:  Eric Suarez does not have vital signs available for review today.  Given telephonic nature of communication, physical exam is limited. AAOx3. NAD. Normal affect.  Speech and respirations are unlabored.  Accessory Clinical Findings    None  Assessment & Plan    1.  Preoperative Cardiovascular Risk Assessment:     Mr. Can perioperative risk of a major cardiac event is 0.4% according to the Revised Cardiac Risk Index (RCRI).  Therefore, he is at low risk for perioperative complications.   His functional capacity is fair at 4.31 METs according to the Duke Activity Status Index (DASI). Recommendations: According to ACC/AHA guidelines, no further cardiovascular testing needed.  The patient may proceed to surgery at acceptable risk.    Antiplatelet and/or Anticoagulation Recommendations: Aspirin can be held for 5-7 days prior to his surgery.  Please resume Aspirin post operatively when it is felt to be safe from a bleeding standpoint.   A copy of this note will be routed to requesting  surgeon.  Time:   Today, I have spent 6 minutes with the patient with telehealth technology discussing medical history, symptoms, and management plan.     Mable Fill, Marissa Nestle, NP  11/15/2022, 7:47 AM

## 2022-11-18 MED ORDER — TRANEXAMIC ACID 1000 MG/10ML IV SOLN
2000.0000 mg | INTRAVENOUS | Status: DC
  Filled 2022-11-18: qty 20

## 2022-11-18 NOTE — Progress Notes (Signed)
Patient was called and informed that the surgery time for tomorrow was changed to 07:30 o'clock. Patient was instructed to be at the hospital at 05:30 o'clock and stop drinking clear liquids at 04:30 o'clock. Patient verbalized understanding.

## 2022-11-19 ENCOUNTER — Other Ambulatory Visit: Payer: Self-pay

## 2022-11-19 ENCOUNTER — Ambulatory Visit (HOSPITAL_BASED_OUTPATIENT_CLINIC_OR_DEPARTMENT_OTHER): Payer: PPO | Admitting: Physician Assistant

## 2022-11-19 ENCOUNTER — Encounter (HOSPITAL_COMMUNITY)
Admission: RE | Disposition: A | Discharge status: Home Health | Payer: Self-pay | Source: Ambulatory Visit | Attending: Orthopedic Surgery

## 2022-11-19 ENCOUNTER — Ambulatory Visit (HOSPITAL_COMMUNITY): Payer: PPO | Admitting: Physician Assistant

## 2022-11-19 ENCOUNTER — Encounter (HOSPITAL_COMMUNITY): Payer: Self-pay | Admitting: Orthopedic Surgery

## 2022-11-19 ENCOUNTER — Observation Stay (HOSPITAL_COMMUNITY)
Admission: RE | Admit: 2022-11-19 | Discharge: 2022-11-20 | Disposition: A | Discharge status: Home Health | Payer: PPO | Source: Ambulatory Visit | Attending: Orthopedic Surgery | Admitting: Orthopedic Surgery

## 2022-11-19 DIAGNOSIS — N183 Chronic kidney disease, stage 3 unspecified: Secondary | ICD-10-CM | POA: Insufficient documentation

## 2022-11-19 DIAGNOSIS — Z79899 Other long term (current) drug therapy: Secondary | ICD-10-CM | POA: Insufficient documentation

## 2022-11-19 DIAGNOSIS — I129 Hypertensive chronic kidney disease with stage 1 through stage 4 chronic kidney disease, or unspecified chronic kidney disease: Secondary | ICD-10-CM | POA: Diagnosis not present

## 2022-11-19 DIAGNOSIS — I10 Essential (primary) hypertension: Secondary | ICD-10-CM | POA: Diagnosis not present

## 2022-11-19 DIAGNOSIS — E039 Hypothyroidism, unspecified: Secondary | ICD-10-CM | POA: Insufficient documentation

## 2022-11-19 DIAGNOSIS — F419 Anxiety disorder, unspecified: Secondary | ICD-10-CM

## 2022-11-19 DIAGNOSIS — M179 Osteoarthritis of knee, unspecified: Secondary | ICD-10-CM | POA: Diagnosis present

## 2022-11-19 DIAGNOSIS — I2510 Atherosclerotic heart disease of native coronary artery without angina pectoris: Secondary | ICD-10-CM | POA: Insufficient documentation

## 2022-11-19 DIAGNOSIS — Z85528 Personal history of other malignant neoplasm of kidney: Secondary | ICD-10-CM | POA: Diagnosis not present

## 2022-11-19 DIAGNOSIS — M1711 Unilateral primary osteoarthritis, right knee: Secondary | ICD-10-CM

## 2022-11-19 DIAGNOSIS — G8918 Other acute postprocedural pain: Secondary | ICD-10-CM | POA: Diagnosis not present

## 2022-11-19 DIAGNOSIS — M1710 Unilateral primary osteoarthritis, unspecified knee: Secondary | ICD-10-CM | POA: Diagnosis present

## 2022-11-19 DIAGNOSIS — Z01818 Encounter for other preprocedural examination: Secondary | ICD-10-CM

## 2022-11-19 HISTORY — PX: TOTAL KNEE ARTHROPLASTY: SHX125

## 2022-11-19 SURGERY — ARTHROPLASTY, KNEE, TOTAL
Anesthesia: Monitor Anesthesia Care | Site: Knee | Laterality: Right

## 2022-11-19 MED ORDER — PHENOL 1.4 % MT LIQD: 1.0000 | OROMUCOSAL | Status: DC | PRN

## 2022-11-19 MED ORDER — DEXAMETHASONE SODIUM PHOSPHATE 10 MG/ML IJ SOLN
INTRAMUSCULAR | Status: AC
  Filled 2022-11-19: qty 1

## 2022-11-19 MED ORDER — NAPROXEN 250 MG PO TABS
250.0000 mg | ORAL_TABLET | Freq: Two times a day (BID) | ORAL | Status: DC
  Administered 2022-11-19 – 2022-11-20 (×2): 250 mg via ORAL
  Filled 2022-11-19 (×2): qty 1

## 2022-11-19 MED ORDER — PAROXETINE HCL ER 12.5 MG PO TB24
25.0000 mg | ORAL_TABLET | Freq: Every day | ORAL | Status: DC
  Administered 2022-11-20: 25 mg via ORAL
  Filled 2022-11-19: qty 2

## 2022-11-19 MED ORDER — OXYCODONE HCL 5 MG PO TABS: 5.0000 mg | ORAL_TABLET | Freq: Once | ORAL | Status: DC | PRN

## 2022-11-19 MED ORDER — ACETAMINOPHEN 325 MG PO TABS: 325.0000 mg | ORAL_TABLET | Freq: Four times a day (QID) | ORAL | Status: DC | PRN

## 2022-11-19 MED ORDER — VANCOMYCIN HCL 1000 MG IV SOLR
INTRAVENOUS | Status: DC | PRN
  Administered 2022-11-19: 1000 mg

## 2022-11-19 MED ORDER — PROPOFOL 10 MG/ML IV BOLUS
INTRAVENOUS | Status: DC | PRN
  Administered 2022-11-19: 50 mg via INTRAVENOUS
  Administered 2022-11-19: 30 mg via INTRAVENOUS

## 2022-11-19 MED ORDER — LEVOTHYROXINE SODIUM 25 MCG PO TABS
125.0000 ug | ORAL_TABLET | Freq: Every day | ORAL | Status: DC
  Administered 2022-11-20: 125 ug via ORAL
  Filled 2022-11-19: qty 1

## 2022-11-19 MED ORDER — OXYCODONE HCL 5 MG/5ML PO SOLN: 5.0000 mg | Freq: Once | ORAL | Status: DC | PRN

## 2022-11-19 MED ORDER — MENTHOL 3 MG MT LOZG: 1.0000 | LOZENGE | OROMUCOSAL | Status: DC | PRN

## 2022-11-19 MED ORDER — ATENOLOL-CHLORTHALIDONE 50-25 MG PO TABS: 1.0000 | ORAL_TABLET | Freq: Every day | ORAL | Status: DC

## 2022-11-19 MED ORDER — TRANEXAMIC ACID 1000 MG/10ML IV SOLN
INTRAVENOUS | Status: DC | PRN
  Administered 2022-11-19: 2000 mg via TOPICAL

## 2022-11-19 MED ORDER — ONDANSETRON HCL 4 MG/2ML IJ SOLN
INTRAMUSCULAR | Status: DC | PRN
  Administered 2022-11-19: 4 mg via INTRAVENOUS

## 2022-11-19 MED ORDER — PANTOPRAZOLE SODIUM 40 MG PO TBEC
40.0000 mg | DELAYED_RELEASE_TABLET | Freq: Every day | ORAL | Status: DC
  Administered 2022-11-20: 40 mg via ORAL
  Filled 2022-11-19: qty 1

## 2022-11-19 MED ORDER — DEXAMETHASONE SODIUM PHOSPHATE 10 MG/ML IJ SOLN
INTRAMUSCULAR | Status: DC | PRN
  Administered 2022-11-19: 10 mg via INTRAVENOUS

## 2022-11-19 MED ORDER — FENTANYL CITRATE (PF) 250 MCG/5ML IJ SOLN
INTRAMUSCULAR | Status: AC
  Filled 2022-11-19: qty 5

## 2022-11-19 MED ORDER — TRANEXAMIC ACID-NACL 1000-0.7 MG/100ML-% IV SOLN
1000.0000 mg | INTRAVENOUS | Status: AC
  Administered 2022-11-19: 1000 mg via INTRAVENOUS
  Filled 2022-11-19: qty 100

## 2022-11-19 MED ORDER — BUPIVACAINE LIPOSOME 1.3 % IJ SUSP
INTRAMUSCULAR | Status: AC
  Filled 2022-11-19: qty 20

## 2022-11-19 MED ORDER — POTASSIUM CHLORIDE CRYS ER 20 MEQ PO TBCR
20.0000 meq | EXTENDED_RELEASE_TABLET | Freq: Every day | ORAL | Status: DC
  Administered 2022-11-19 – 2022-11-20 (×2): 20 meq via ORAL
  Filled 2022-11-19 (×2): qty 1

## 2022-11-19 MED ORDER — ATENOLOL 50 MG PO TABS
50.0000 mg | ORAL_TABLET | Freq: Every day | ORAL | Status: DC
  Administered 2022-11-20: 50 mg via ORAL
  Filled 2022-11-19: qty 1

## 2022-11-19 MED ORDER — CLONIDINE HCL (ANALGESIA) 100 MCG/ML EP SOLN
EPIDURAL | Status: AC
  Filled 2022-11-19: qty 10

## 2022-11-19 MED ORDER — METOCLOPRAMIDE HCL 5 MG PO TABS: 5.0000 mg | ORAL_TABLET | Freq: Three times a day (TID) | ORAL | Status: DC | PRN

## 2022-11-19 MED ORDER — CHLORHEXIDINE GLUCONATE 0.12 % MT SOLN
15.0000 mL | Freq: Once | OROMUCOSAL | Status: AC
  Administered 2022-11-19: 15 mL via OROMUCOSAL
  Filled 2022-11-19: qty 15

## 2022-11-19 MED ORDER — EZETIMIBE 10 MG PO TABS
10.0000 mg | ORAL_TABLET | Freq: Every day | ORAL | Status: DC
  Administered 2022-11-20: 10 mg via ORAL
  Filled 2022-11-19: qty 1

## 2022-11-19 MED ORDER — ONDANSETRON HCL 4 MG/2ML IJ SOLN: 4.0000 mg | Freq: Four times a day (QID) | INTRAMUSCULAR | Status: DC | PRN

## 2022-11-19 MED ORDER — PHENYLEPHRINE HCL-NACL 20-0.9 MG/250ML-% IV SOLN
INTRAVENOUS | Status: DC | PRN
  Administered 2022-11-19: 50 ug/min via INTRAVENOUS

## 2022-11-19 MED ORDER — ONDANSETRON HCL 4 MG PO TABS: 4.0000 mg | ORAL_TABLET | Freq: Four times a day (QID) | ORAL | Status: DC | PRN

## 2022-11-19 MED ORDER — CEFAZOLIN SODIUM-DEXTROSE 2-4 GM/100ML-% IV SOLN
2.0000 g | INTRAVENOUS | Status: AC
  Administered 2022-11-19: 2 g via INTRAVENOUS
  Filled 2022-11-19: qty 100

## 2022-11-19 MED ORDER — 0.9 % SODIUM CHLORIDE (POUR BTL) OPTIME
TOPICAL | Status: DC | PRN
  Administered 2022-11-19: 1000 mL
  Administered 2022-11-19: 3000 mL

## 2022-11-19 MED ORDER — LACTATED RINGERS IV SOLN: INTRAVENOUS | Status: DC

## 2022-11-19 MED ORDER — HYDROCHLOROTHIAZIDE 25 MG PO TABS
25.0000 mg | ORAL_TABLET | Freq: Every day | ORAL | Status: DC
  Administered 2022-11-20: 25 mg via ORAL
  Filled 2022-11-19: qty 1

## 2022-11-19 MED ORDER — MIDAZOLAM HCL 2 MG/2ML IJ SOLN
INTRAMUSCULAR | Status: AC
  Filled 2022-11-19: qty 2

## 2022-11-19 MED ORDER — ORAL CARE MOUTH RINSE: 15.0000 mL | Freq: Once | OROMUCOSAL | Status: AC

## 2022-11-19 MED ORDER — DOCUSATE SODIUM 100 MG PO CAPS
100.0000 mg | ORAL_CAPSULE | Freq: Two times a day (BID) | ORAL | Status: DC
  Administered 2022-11-19 – 2022-11-20 (×2): 100 mg via ORAL
  Filled 2022-11-19 (×3): qty 1

## 2022-11-19 MED ORDER — ALFUZOSIN HCL ER 10 MG PO TB24
10.0000 mg | ORAL_TABLET | Freq: Every day | ORAL | Status: DC
  Administered 2022-11-20: 10 mg via ORAL
  Filled 2022-11-19 (×2): qty 1

## 2022-11-19 MED ORDER — PROPOFOL 500 MG/50ML IV EMUL
INTRAVENOUS | Status: DC | PRN
  Administered 2022-11-19: 25 ug/kg/min via INTRAVENOUS

## 2022-11-19 MED ORDER — NITROGLYCERIN 0.4 MG SL SUBL: 0.4000 mg | SUBLINGUAL_TABLET | SUBLINGUAL | Status: DC | PRN

## 2022-11-19 MED ORDER — ONDANSETRON HCL 4 MG/2ML IJ SOLN
INTRAMUSCULAR | Status: AC
  Filled 2022-11-19: qty 2

## 2022-11-19 MED ORDER — VANCOMYCIN HCL 1000 MG IV SOLR
INTRAVENOUS | Status: AC
  Filled 2022-11-19: qty 20

## 2022-11-19 MED ORDER — ROPIVACAINE HCL 5 MG/ML IJ SOLN
INTRAMUSCULAR | Status: DC | PRN
  Administered 2022-11-19: 25 mL via PERINEURAL

## 2022-11-19 MED ORDER — FENTANYL CITRATE (PF) 250 MCG/5ML IJ SOLN
INTRAMUSCULAR | Status: DC | PRN
  Administered 2022-11-19: 50 ug via INTRAVENOUS

## 2022-11-19 MED ORDER — METOCLOPRAMIDE HCL 5 MG/ML IJ SOLN: 5.0000 mg | Freq: Three times a day (TID) | INTRAMUSCULAR | Status: DC | PRN

## 2022-11-19 MED ORDER — POVIDONE-IODINE 10 % EX SWAB
2.0000 | Freq: Once | CUTANEOUS | Status: AC
  Administered 2022-11-19: 2 via TOPICAL

## 2022-11-19 MED ORDER — CEFAZOLIN SODIUM-DEXTROSE 1-4 GM/50ML-% IV SOLN
1.0000 g | Freq: Four times a day (QID) | INTRAVENOUS | Status: AC
  Administered 2022-11-19 – 2022-11-20 (×2): 1 g via INTRAVENOUS
  Filled 2022-11-19: qty 50

## 2022-11-19 MED ORDER — CEFAZOLIN SODIUM-DEXTROSE 1-4 GM/50ML-% IV SOLN
1.0000 g | Freq: Four times a day (QID) | INTRAVENOUS | Status: DC
  Filled 2022-11-19: qty 50

## 2022-11-19 MED ORDER — VITAMIN C 500 MG PO TABS
500.0000 mg | ORAL_TABLET | Freq: Every day | ORAL | Status: DC
  Administered 2022-11-19 – 2022-11-20 (×2): 500 mg via ORAL
  Filled 2022-11-19 (×2): qty 1

## 2022-11-19 MED ORDER — GABAPENTIN 300 MG PO CAPS
300.0000 mg | ORAL_CAPSULE | Freq: Two times a day (BID) | ORAL | Status: DC
  Administered 2022-11-19 – 2022-11-20 (×3): 300 mg via ORAL
  Filled 2022-11-19 (×3): qty 1

## 2022-11-19 MED ORDER — SODIUM CHLORIDE (PF) 0.9 % IJ SOLN
INTRAMUSCULAR | Status: DC | PRN
  Administered 2022-11-19: 30 mL

## 2022-11-19 MED ORDER — METHOCARBAMOL 500 MG PO TABS
500.0000 mg | ORAL_TABLET | Freq: Four times a day (QID) | ORAL | Status: DC | PRN
  Administered 2022-11-20: 500 mg via ORAL
  Filled 2022-11-19: qty 1

## 2022-11-19 MED ORDER — MORPHINE SULFATE 4 MG/ML IJ SOLN
INTRAMUSCULAR | Status: DC | PRN
  Administered 2022-11-19: 13 mL

## 2022-11-19 MED ORDER — BUPIVACAINE HCL (PF) 0.25 % IJ SOLN
INTRAMUSCULAR | Status: AC
  Filled 2022-11-19: qty 30

## 2022-11-19 MED ORDER — METHOCARBAMOL 1000 MG/10ML IJ SOLN: 500.0000 mg | Freq: Four times a day (QID) | INTRAVENOUS | Status: DC | PRN

## 2022-11-19 MED ORDER — PROPOFOL 10 MG/ML IV BOLUS
INTRAVENOUS | Status: AC
  Filled 2022-11-19: qty 20

## 2022-11-19 MED ORDER — POVIDONE-IODINE 7.5 % EX SOLN
Freq: Once | CUTANEOUS | Status: DC
  Filled 2022-11-19: qty 118

## 2022-11-19 MED ORDER — SODIUM CHLORIDE 0.9 % IR SOLN
Status: DC | PRN
  Administered 2022-11-19: 3000 mL

## 2022-11-19 MED ORDER — MORPHINE SULFATE (PF) 4 MG/ML IV SOLN
INTRAVENOUS | Status: AC
  Filled 2022-11-19: qty 2

## 2022-11-19 MED ORDER — ASPIRIN 81 MG PO CHEW
81.0000 mg | CHEWABLE_TABLET | Freq: Two times a day (BID) | ORAL | Status: DC
  Administered 2022-11-19 – 2022-11-20 (×2): 81 mg via ORAL
  Filled 2022-11-19 (×2): qty 1

## 2022-11-19 MED ORDER — ALBUMIN HUMAN 5 % IV SOLN: INTRAVENOUS | Status: DC | PRN

## 2022-11-19 MED ORDER — BUPIVACAINE IN DEXTROSE 0.75-8.25 % IT SOLN
INTRATHECAL | Status: DC | PRN
  Administered 2022-11-19: 2 mL via INTRATHECAL

## 2022-11-19 MED ORDER — STERILE WATER FOR IRRIGATION IR SOLN
Status: DC | PRN
  Administered 2022-11-19: 1000 mL

## 2022-11-19 MED ORDER — SODIUM CHLORIDE 0.9 % IV SOLN: INTRAVENOUS | Status: AC

## 2022-11-19 MED ORDER — OXYCODONE HCL 5 MG PO TABS
5.0000 mg | ORAL_TABLET | ORAL | Status: DC | PRN
  Administered 2022-11-19 – 2022-11-20 (×2): 10 mg via ORAL
  Filled 2022-11-19 (×2): qty 2

## 2022-11-19 MED ORDER — DUTASTERIDE 0.5 MG PO CAPS
0.5000 mg | ORAL_CAPSULE | Freq: Every day | ORAL | Status: DC
  Administered 2022-11-20: 0.5 mg via ORAL
  Filled 2022-11-19 (×2): qty 1

## 2022-11-19 MED ORDER — MIDAZOLAM HCL 2 MG/2ML IJ SOLN
INTRAMUSCULAR | Status: DC | PRN
  Administered 2022-11-19 (×2): 1 mg via INTRAVENOUS

## 2022-11-19 MED ORDER — HYDROMORPHONE HCL 1 MG/ML IJ SOLN: 0.5000 mg | INTRAMUSCULAR | Status: DC | PRN

## 2022-11-19 MED ORDER — FENTANYL CITRATE (PF) 100 MCG/2ML IJ SOLN: 25.0000 ug | INTRAMUSCULAR | Status: DC | PRN

## 2022-11-19 SURGICAL SUPPLY — 95 items
BAG COUNTER SPONGE SURGICOUNT (BAG) ×1 IMPLANT
BAG DECANTER FOR FLEXI CONT (MISCELLANEOUS) ×1 IMPLANT
BAG SPNG CNTER NS LX DISP (BAG) ×1
BANDAGE ESMARK 6X9 LF (GAUZE/BANDAGES/DRESSINGS) ×1 IMPLANT
BLADE SAG 18X100X1.27 (BLADE) ×1 IMPLANT
BLADE SAGITTAL (BLADE) ×1
BLADE SAW THK.89X75X18XSGTL (BLADE) ×1 IMPLANT
BLADE SURG 10 STRL SS (BLADE) IMPLANT
BNDG CMPR 5X6 CHSV STRCH STRL (GAUZE/BANDAGES/DRESSINGS) ×1
BNDG CMPR 9X6 STRL LF SNTH (GAUZE/BANDAGES/DRESSINGS)
BNDG CMPR MED 15X6 ELC VLCR LF (GAUZE/BANDAGES/DRESSINGS) ×1
BNDG COHESIVE 6X5 TAN ST LF (GAUZE/BANDAGES/DRESSINGS) IMPLANT
BNDG COHESIVE 6X5 TAN STRL LF (GAUZE/BANDAGES/DRESSINGS) ×1 IMPLANT
BNDG ELASTIC 6X15 VLCR STRL LF (GAUZE/BANDAGES/DRESSINGS) ×1 IMPLANT
BNDG ESMARK 6X9 LF (GAUZE/BANDAGES/DRESSINGS)
BOWL SMART MIX CTS (DISPOSABLE) IMPLANT
BSPLAT TIB 5D G CMNT STM RT (Knees) ×1 IMPLANT
CEMENT BONE SIMPLEX SPEEDSET (Cement) IMPLANT
CNTNR URN SCR LID CUP LEK RST (MISCELLANEOUS) ×1 IMPLANT
COMP FEM CMT PERSONA SZ9 RT (Joint) ×1 IMPLANT
COMPONENT FEM CMT PRSONA SZ9RT (Joint) IMPLANT
CONT SPEC 4OZ STRL OR WHT (MISCELLANEOUS) ×1
COOLER ICEMAN CLASSIC (MISCELLANEOUS) IMPLANT
COVER SURGICAL LIGHT HANDLE (MISCELLANEOUS) ×1 IMPLANT
CUFF TOURN SGL QUICK 34 (TOURNIQUET CUFF) ×1
CUFF TOURN SGL QUICK 42 (TOURNIQUET CUFF) IMPLANT
CUFF TRNQT CYL 34X4.125X (TOURNIQUET CUFF) ×1 IMPLANT
DRAPE INCISE IOBAN 66X45 STRL (DRAPES) IMPLANT
DRAPE ORTHO SPLIT 77X108 STRL (DRAPES) ×3
DRAPE SURG ORHT 6 SPLT 77X108 (DRAPES) ×3 IMPLANT
DRAPE U-SHAPE 47X51 STRL (DRAPES) ×1 IMPLANT
DRSG AQUACEL AG ADV 3.5X14 (GAUZE/BANDAGES/DRESSINGS) IMPLANT
DURAPREP 26ML APPLICATOR (WOUND CARE) ×2 IMPLANT
ELECT CAUTERY BLADE 6.4 (BLADE) ×1 IMPLANT
ELECT REM PT RETURN 9FT ADLT (ELECTROSURGICAL) ×1
ELECTRODE REM PT RTRN 9FT ADLT (ELECTROSURGICAL) ×1 IMPLANT
GAUZE SPONGE 4X4 12PLY STRL (GAUZE/BANDAGES/DRESSINGS) ×1 IMPLANT
GLOVE BIOGEL PI IND STRL 7.0 (GLOVE) ×1 IMPLANT
GLOVE BIOGEL PI IND STRL 8 (GLOVE) ×1 IMPLANT
GLOVE ECLIPSE 7.0 STRL STRAW (GLOVE) ×1 IMPLANT
GLOVE ECLIPSE 8.0 STRL XLNG CF (GLOVE) ×1 IMPLANT
GLOVE SURG ENC MOIS LTX SZ6.5 (GLOVE) ×3 IMPLANT
GOWN STRL REUS W/ TWL LRG LVL3 (GOWN DISPOSABLE) ×3 IMPLANT
GOWN STRL REUS W/TWL LRG LVL3 (GOWN DISPOSABLE) ×3
HANDPIECE INTERPULSE COAX TIP (DISPOSABLE) ×1
HDLS TROCR DRIL PIN KNEE 75 (PIN) ×1
HOOD PEEL AWAY FLYTE STAYCOOL (MISCELLANEOUS) ×3 IMPLANT
IMMOBILIZER KNEE 20 (SOFTGOODS)
IMMOBILIZER KNEE 20 THIGH 36 (SOFTGOODS) IMPLANT
IMMOBILIZER KNEE 22 UNIV (SOFTGOODS) IMPLANT
IMMOBILIZER KNEE 24 THIGH 36 (MISCELLANEOUS) IMPLANT
IMMOBILIZER KNEE 24 UNIV (MISCELLANEOUS)
INSERT TIBIAL PERSONA 10 RT (Insert) IMPLANT
KIT BASIN OR (CUSTOM PROCEDURE TRAY) ×1 IMPLANT
KIT TURNOVER KIT B (KITS) ×1 IMPLANT
MANIFOLD NEPTUNE II (INSTRUMENTS) ×1 IMPLANT
NDL 22X1.5 STRL (OR ONLY) (MISCELLANEOUS) ×2 IMPLANT
NDL SPNL 18GX3.5 QUINCKE PK (NEEDLE) ×1 IMPLANT
NEEDLE 22X1.5 STRL (OR ONLY) (MISCELLANEOUS) ×2 IMPLANT
NEEDLE SPNL 18GX3.5 QUINCKE PK (NEEDLE) ×1 IMPLANT
NS IRRIG 1000ML POUR BTL (IV SOLUTION) ×2 IMPLANT
PACK TOTAL JOINT (CUSTOM PROCEDURE TRAY) ×1 IMPLANT
PAD ARMBOARD 7.5X6 YLW CONV (MISCELLANEOUS) ×2 IMPLANT
PAD CAST 4YDX4 CTTN HI CHSV (CAST SUPPLIES) ×1 IMPLANT
PAD COLD SHLDR WRAP-ON (PAD) IMPLANT
PADDING CAST COTTON 4X4 STRL (CAST SUPPLIES)
PADDING CAST COTTON 6X4 STRL (CAST SUPPLIES) ×1 IMPLANT
PIN DRILL HDLS TROCAR 75 4PK (PIN) IMPLANT
SCREW FEMALE HEX FIX 25X2.5 (ORTHOPEDIC DISPOSABLE SUPPLIES) IMPLANT
SET HNDPC FAN SPRY TIP SCT (DISPOSABLE) ×1 IMPLANT
SPIKE FLUID TRANSFER (MISCELLANEOUS) ×1 IMPLANT
STEM POLY PAT PLY 32M KNEE (Knees) IMPLANT
STEM TIBIA 5 DEG SZ G R KNEE (Knees) IMPLANT
STRIP CLOSURE SKIN 1/2X4 (GAUZE/BANDAGES/DRESSINGS) ×2 IMPLANT
SUCTION FRAZIER HANDLE 10FR (MISCELLANEOUS) ×1
SUCTION TUBE FRAZIER 10FR DISP (MISCELLANEOUS) ×1 IMPLANT
SUT MNCRL AB 3-0 PS2 18 (SUTURE) ×1 IMPLANT
SUT VIC AB 0 CT1 27 (SUTURE) ×2
SUT VIC AB 0 CT1 27XBRD ANBCTR (SUTURE) ×3 IMPLANT
SUT VIC AB 1 CT1 27 (SUTURE) ×2
SUT VIC AB 1 CT1 27XBRD ANBCTR (SUTURE) IMPLANT
SUT VIC AB 1 CT1 36 (SUTURE) ×5 IMPLANT
SUT VIC AB 2-0 CT1 27 (SUTURE) ×4
SUT VIC AB 2-0 CT1 TAPERPNT 27 (SUTURE) ×4 IMPLANT
SYR 30ML LL (SYRINGE) ×3 IMPLANT
SYR TB 1ML LUER SLIP (SYRINGE) ×1 IMPLANT
TIBIA STEM 5 DEG SZ G R KNEE (Knees) ×1 IMPLANT
TIBIAL INSERT PERSONA 10 RT (Insert) ×1 IMPLANT
TOWEL GREEN STERILE (TOWEL DISPOSABLE) ×2 IMPLANT
TOWEL GREEN STERILE FF (TOWEL DISPOSABLE) ×2 IMPLANT
TRAY CATH 16FR W/PLASTIC CATH (SET/KITS/TRAYS/PACK) IMPLANT
TRAY FOLEY W/BAG SLVR 16FR (SET/KITS/TRAYS/PACK) ×1
TRAY FOLEY W/BAG SLVR 16FR ST (SET/KITS/TRAYS/PACK) IMPLANT
WATER STERILE IRR 1000ML POUR (IV SOLUTION) IMPLANT
YANKAUER SUCT BULB TIP NO VENT (SUCTIONS) ×1 IMPLANT

## 2022-11-19 NOTE — Progress Notes (Signed)
Orthopedic Tech Progress Note Patient Details:  Eric Suarez [birth year] [id number]  patient was sitting in chair when I came to apply CPM, patient stated he has one at home, and knows how it work.   CPM Right Knee CPM Right Knee: Off Right Knee Flexion (Degrees): 10 Right Knee Extension (Degrees): 40  Post Interventions Patient Tolerated: Well Instructions Provided: Care of device Ortho Devices Type of Ortho Device: Bone foam zero knee Ortho Device/Splint Location: RLE Ortho Device/Splint Interventions: Ordered   Post Interventions Patient Tolerated: Well Instructions Provided: Care of device  Janit Pagan 11/19/2022, 2:58 PM

## 2022-11-19 NOTE — Op Note (Unsigned)
NAME: Eric Suarez, Eric Suarez MEDICAL RECORD NO: [id number] ACCOUNT NO: [id number] DATE OF BIRTH: 01-19-1946 FACILITY: [facility] LOCATION: [facility] PHYSICIAN: Yetta Barre. Marlou Sa, MD  Operative Report   DATE OF PROCEDURE: 11/19/2022  PREOPERATIVE DIAGNOSIS:  Right knee arthritis.  POSTOPERATIVE DIAGNOSIS:  Right knee arthritis.  PROCEDURE:  Right total knee replacement, cemented Persona using implant sizes, size 9 cruciate retaining cemented femur with right size G cemented stemmed tibia, 32 mm cemented 3-peg patella and a 10 mm medial congruent highly cross-linked polyethylene  liner.  SURGEON:  Yetta Barre. Marlou Sa, MD  ASSISTANT:  April Green, RNFA.  INDICATIONS:  The patient is a 77 year old patient with end-stage right knee arthritis, who presents for operative management after explanation of risks and benefits.  DESCRIPTION OF PROCEDURE:  The patient was brought to the operating room where spinal anesthetic was induced.  Preoperative antibiotics administered.  Timeout was called.  TXA was administered.  Right leg was prescrubbed with alcohol and Betadine,  allowed to air dry, prepped with DuraPrep solution and draped in sterile manner.  Ioban used to cover the operative field.  After calling timeout, leg was elevated and exsanguinated with the Esmarch wrap.  Tourniquet was inflated.  Anterior approach to  the knee was made.  IrriSept solution utilized. Median parapatellar arthrotomy was made and marked with #1 Vicryl suture. IrriSept solution again used to irrigate the intra-articular joint.  The patient had severe end-stage patellofemoral arthritis along  with full thickness cartilage loss and arthritis in the medial and lateral compartments.  Patella was everted.  Lateral patellofemoral ligament was released.  Fat pad was partially excised.  Medial soft tissue dissection was performed in order to allow  for visualization.  The lateral meniscus was macerated.  ACL and anterior horn lateral meniscus was  released.  Soft tissue released from the anterior distal femur.  At this time, the patella was everted.  Knee was flexed.  Intramedullary alignment was  then used to make a cut of 10 mm off the least affected lateral tibial plateau with the posterior neurovascular structures and collateral ligaments protected.  At this time, bone quality was good, but not really sufficient for press-fit prosthesis.  The  cut was made. Attention was directed towards the femur.  A femoral cut made in 5 degrees of valgus.  A 10 mm cut was made off of the distal femur.  With the tibial and femoral cuts made a 10 mm spacer did give full extension with good stability to varus  and valgus stress at 0 degrees.  The femoral cut was completed, going with size 9.  Anterior, posterior and chamfer cuts were made in 4 degrees of external rotation due to the patient's preoperative patellofemoral instability and wear.  Patella itself  was extremely thin particularly on the lateral facet.  Next, the tibial tray was placed in the appropriate rotation in line with the medial third of the tibial tubercle.  The tray was placed and then, trial femoral component was placed and a 10 mm spacer  was placed.  Next, the patella was then cut down from 18 to about 11 mm.  We were able to get within 1-2 mm of exposing that concave lateral facet.  That bone edge, which was about one-third of the surface area of the cut patella was roughened with a  curette.  Next, a trial patellar button was placed with two of the pegs in the optimal bone on the medial side of the patella.  The patella height measured  about 20 mm.  Next, the patient was taken through range of motion and found to have full  extension, full flexion, and excellent patellar tracking using no thumbs technique.  Tibial preparation was completed. Next, the trial components were removed.  Thorough irrigation was performed using 3 liters of pulsatile irrigation.  Next TXA sponge  was allowed to sit  in the incision along with IrriSept solution for 3 minutes.  The capsule was also anesthetized using a combination of Marcaine, Exparel and saline.  Following this, the sponges were removed.  The bony cut surfaces were dried.  Bone  plug placed in the femur.  IrriSept solution and vancomycin powder placed in the tibia and then the bone plug was placed in the tibia.  Cementing of the components was then performed with excess cement removed.  The patient maintained the same stability  parameters with excellent patellar tracking using no thumbs technique and intact and gave very good stability at 0, 30 and 90 degrees of flexion.  At this time, the tourniquet was released.  Bleeding points encountered controlled using electrocautery.   Pouring irrigation x3 liters then utilized.  The arthrotomy was then closed over bolster using #1 Vicryl suture.  Prior to final arthrotomy closure the knee joint was again irrigated with IrriSept solution and then suctioned out and then vancomycin  powder placed into the knee joint.  The arthrotomy was then completely closed using #1 Vicryl suture.  A solution of Marcaine, morphine, clonidine then injected into the knee joint for postop pain relief. The rest of the closure was performed using 0  Vicryl suture, 2-0 Vicryl suture, and 3-0 Monocryl with Steri-Strips.  Aquacel dressing, Ace wrap, iceman and knee immobilizer placed.  The patient tolerated the procedure well without immediate complications, transferred to the recovery room in stable  condition.   SHY D: 11/19/2022 10:32:38 am T: 11/19/2022 10:49:00 am  JOB: [id number]/ [id number]

## 2022-11-19 NOTE — Evaluation (Signed)
Physical Therapy Evaluation Patient Details Name: Eric Suarez MRN: [id number] DOB: [birth year] Today's Date: 11/19/2022  History of Present Illness  77 y.o. male presents to [facility] hospital on 11/19/2022 for elective R TKA. PMH includes anxiety, BPH, CKD, GERD, CAD, HTN, macular degeneration, RCC.  Clinical Impression  Pt presents to PT with deficits in functional mobility, ROM, balance, strength, power, endurance. Pt has difficulty transferring from lower surfaces at baseline, continuing to have difficulty today as he is dependent on his LLE and BUE to push up into standing with R knee in knee immobilizer. Pt currently requires physical assistance to ascend into standing and will benefit from further transfer training tomorrow in an effort to restore independence. Once standing pt ambulates well with support of walker. PT provides education on total knee exercises, pt will begin tomorrow.       Recommendations for follow up therapy are one component of a multi-disciplinary discharge planning process, led by the attending physician.  Recommendations may be updated based on patient status, additional functional criteria and insurance authorization.  Follow Up Recommendations Follow physician's recommendations for discharge plan and follow up therapies      Assistance Recommended at Discharge Intermittent Supervision/Assistance  Patient can return home with the following  A little help with walking and/or transfers;A little help with bathing/dressing/bathroom;Assistance with cooking/housework;Assist for transportation;Help with stairs or ramp for entrance    Equipment Recommendations BSC/3in1  Recommendations for Other Services       Functional Status Assessment Patient has had a recent decline in their functional status and demonstrates the ability to make significant improvements in function in a reasonable and predictable amount of time.     Precautions / Restrictions  Precautions Precautions: Fall;Knee Precaution Booklet Issued: Yes (comment) Required Braces or Orthoses: Knee Immobilizer - Right Knee Immobilizer - Right: Other (comment) (on when walking per orders) Restrictions Weight Bearing Restrictions: Yes RLE Weight Bearing: Weight bearing as tolerated      Mobility  Bed Mobility Overal bed mobility: Needs Assistance Bed Mobility: Supine to Sit     Supine to sit: Supervision, HOB elevated          Transfers Overall transfer level: Needs assistance Equipment used: Rolling walker (2 wheels) Transfers: Sit to/from Stand Sit to Stand: Min assist, From elevated surface           General transfer comment: pt requires physical assistance to power up into standing. Pt has difficulty standing from lower surfaces at baseline, difficulty today with knee immobilizer on preventing knee flexion to allow for pt to push through RLE into standing    Ambulation/Gait Ambulation/Gait assistance: Min guard Gait Distance (Feet): 60 Feet (additional trial of 20') Assistive device: Rolling walker (2 wheels) Gait Pattern/deviations: Step-through pattern Gait velocity: reduced Gait velocity interpretation: <1.31 ft/sec, indicative of household ambulator   General Gait Details: slowed step-through gait, reduced stance time and knee extension of RLE  Stairs            Wheelchair Mobility    Modified Rankin (Stroke Patients Only)       Balance Overall balance assessment: Needs assistance Sitting-balance support: No upper extremity supported, Feet supported Sitting balance-Leahy Scale: Good     Standing balance support: Reliant on assistive device for balance, Single extremity supported Standing balance-Leahy Scale: Poor                               Pertinent Vitals/Pain Pain Assessment Pain  Assessment: 0-10 Pain Score: 3  Pain Location: R knee Pain Descriptors / Indicators: Aching Pain Intervention(s): Monitored  during session    Home Living Family/patient expects to be discharged to:: Private residence Living Arrangements: Spouse/significant other Available Help at Discharge: Family;Available 24 hours/day Type of Home: House Home Access: Stairs to enter Entrance Stairs-Rails: Right Entrance Stairs-Number of Steps: 3   Home Layout: One level Home Equipment: Conservation officer, nature (2 wheels);Cane - single point;Shower seat      Prior Function Prior Level of Function : Independent/Modified Independent             Mobility Comments: pt ambulates independently, utilizes a SPC if up for extended periods. Did fall the day prior to admission for surgery       Hand Dominance        Extremity/Trunk Assessment   Upper Extremity Assessment Upper Extremity Assessment: Overall WFL for tasks assessed    Lower Extremity Assessment Lower Extremity Assessment: RLE deficits/detail RLE Deficits / Details: RLE in knee immobilizer, ankle PF/DF [facility] 4/5, pt is able to perform SLR at bed level    Cervical / Trunk Assessment Cervical / Trunk Assessment: Normal  Communication   Communication: No difficulties  Cognition Arousal/Alertness: Awake/alert Behavior During Therapy: WFL for tasks assessed/performed Overall Cognitive Status: Within Functional Limits for tasks assessed                                          General Comments General comments (skin integrity, edema, etc.): VSS on RA    Exercises     Assessment/Plan    PT Assessment Patient needs continued PT services  PT Problem List Decreased strength;Decreased activity tolerance;Decreased range of motion;Decreased balance;Decreased mobility;Decreased knowledge of use of DME;Pain       PT Treatment Interventions DME instruction;Gait training;Stair training;Functional mobility training;Therapeutic activities;Therapeutic exercise;Balance training;Neuromuscular re-education;Patient/family education    PT Goals (Current  goals can be found in the Care Plan section)  Acute Rehab PT Goals Patient Stated Goal: to return to independence PT Goal Formulation: With patient Time For Goal Achievement: 11/24/22 Potential to Achieve Goals: Good    Frequency 7X/week     Co-evaluation               AM-PAC PT "6 Clicks" Mobility  Outcome Measure Help needed turning from your back to your side while in a flat bed without using bedrails?: A Little Help needed moving from lying on your back to sitting on the side of a flat bed without using bedrails?: A Little Help needed moving to and from a bed to a chair (including a wheelchair)?: A Little Help needed standing up from a chair using your arms (e.g., wheelchair or bedside chair)?: A Little Help needed to walk in hospital room?: A Little Help needed climbing 3-5 steps with a railing? : Total 6 Click Score: 16    End of Session Equipment Utilized During Treatment: Right knee immobilizer Activity Tolerance: Patient tolerated treatment well Patient left: in chair;with call bell/phone within reach;with chair alarm set Nurse Communication: Mobility status PT Visit Diagnosis: Other abnormalities of gait and mobility (R26.89);Muscle weakness (generalized) (M62.81);History of falling (Z91.81)    Time: 8675-4492 PT Time Calculation (min) (ACUTE ONLY): 38 min   Charges:   PT Evaluation $PT Eval Low Complexity: 1 Low PT Treatments $Therapeutic Activity: 8-22 mins       Zenaida Niece, PT,  DPT Acute Rehabilitation Office [phone]   Zenaida Niece 11/19/2022, 2:51 PM

## 2022-11-19 NOTE — Brief Op Note (Signed)
   11/19/2022  10:25 AM  PATIENT:  Eric Suarez  77 y.o. male  PRE-OPERATIVE DIAGNOSIS:  right knee osteoarthritis  POST-OPERATIVE DIAGNOSIS:  right knee osteoarthritis  PROCEDURE:  Procedure(s): RIGHT TOTAL KNEE ARTHROPLASTY  SURGEON:  Surgeon(s): Meredith Pel, MD  ASSISTANT: April Green, RNFA  ANESTHESIA:   spinal  EBL: 50 ml    Total I/O In: 9381 [I.V.:1200; IV Piggyback:450] Out: 8299 [Urine:1000; Blood:50]  BLOOD ADMINISTERED: none  DRAINS: None  LOCAL MEDICATIONS USED: Marcaine morphine Exparel clonidine vancomycin powder  SPECIMEN:  No Specimen  COUNTS:  YES  TOURNIQUET:   Total Tourniquet Time Documented: Thigh (Right) - 76 minutes Total: Thigh (Right) - 76 minutes   DICTATION: .Other Dictation: Dictation Number [id number]  PLAN OF CARE: Admit for overnight observation  PATIENT DISPOSITION:  PACU - hemodynamically stable

## 2022-11-19 NOTE — H&P (Signed)
TOTAL KNEE ADMISSION H&P  Patient is being admitted for right total knee arthroplasty.  Subjective:  Chief Complaint:right knee pain.  HPI: Eric Suarez, 77 y.o. male, has a history of pain and functional disability in the right knee due to arthritis and has failed non-surgical conservative treatments for greater than 12 weeks to includeNSAID's and/or analgesics, corticosteriod injections, viscosupplementation injections, flexibility and strengthening excercises, and activity modification.  Onset of symptoms was gradual, starting >10 years ago with gradually worsening course since that time. The patient noted no past surgery on the right knee(s).  Patient currently rates pain in the right knee(s) at 8 out of 10 with activity. Patient has night pain, worsening of pain with activity and weight bearing, pain that interferes with activities of daily living, pain with passive range of motion, crepitus, and joint swelling.  Patient has evidence of periarticular osteophytes and joint space narrowing by imaging studies. This patient has had  a history of right knee patellar instability beginning many years ago which has progressed to severe end-stage patellofemoral arthritis.  No personal or family history of DVT or pulmonary embolism. . There is no active infection.  Patient Active Problem List   Diagnosis Date Noted   Pulmonary hypertension, unspecified ([place]) - suggested by CT 12/27/2021   [place] coronary artery calcium score greater than 400 12/10/2021   Mixed dyslipidemia 12/10/2021   Family history of premature coronary artery disease: Father & Brother 12/10/2021   Nonspecific abnormal electrocardiogram (ECG) (EKG) 12/10/2021   Left main coronary artery disease 12/10/2021   Exercise intolerance - CRO Atypical Angina 12/10/2021   S/P hernia repair 10/08/2017   Chronic pain of both knees 94/70/9628   Folliculitis 36/62/9476   SBO (small bowel obstruction) ([place]) 01/10/2017   Post-operative  complication 54/65/0354   Severe sepsis ([place]) 12/25/2016   Flu-like symptoms 12/25/2016   Dehydration with hyponatremia 12/25/2016   AKI (acute kidney injury) ([place]) 12/25/2016   CKD (chronic kidney disease) stage 3, GFR 30-59 ml/min ([place]) 12/25/2016   S/P repair of ventral hernia 12/25/2016   Hypothyroidism 12/25/2016   Essential hypertension 12/25/2016   Neoplasm of left kidney 04/18/2016   Postoperative examination 03/07/2016   Strangulated inguinal hernia 02/29/2016   Past Medical History:  Diagnosis Date   Anxiety    Arthritis    BPH (benign prostatic hyperplasia)    Cataracts, bilateral    Chronic back pain    Followed by Dr. Arnoldo Morale   CKD (chronic kidney disease) stage 3, GFR 30-59 ml/min ([facility]) 09/2021   Creatinine 6.56   Complication of anesthesia    unable to void after surgery   Coronary artery disease 10/31/2021   Coronary Calcium Score 535   GERD (gastroesophageal reflux disease)    History of hiatal hernia    History of kidney surgery    left kidney removed due to cancer 2017   History of thyroid cancer 1998   Thyroidectomy-now on Synthroid   Hypertension    Hypothyroidism (acquired) 1998   Post thyroidectomy for thyroid cancer   Macular degeneration of both eyes    dry   Mixed dyslipidemia 09/27/2021   Low HDL, high triglycerides with normal LDL: TC 117, TG 135, HDL 25, LDL 56.   PONV (postoperative nausea and vomiting)    Renal cell carcinoma, left ([place]) 2017   Left nephrectomy-Dr. Sherilyn Banker, complicated by multiple different hernia surgeries.    Past Surgical History:  Procedure Laterality Date   APPENDECTOMY     COLONOSCOPY  CYSTOSCOPY WITH RETROGRADE PYELOGRAM, URETEROSCOPY AND STENT PLACEMENT Left 04/01/2016   Procedure: CYSTOSCOPY WITH RETROGRADE PYELOGRAM, left ureter;  Surgeon: Raynelle Bring, MD;  Location: [facility];  Service: Urology;  Laterality: Left;   HEMORRHOID SURGERY     HERNIA REPAIR     inguinal-3 on left , 1 on right   INCISIONAL  HERNIA REPAIR N/A 12/23/2016   Procedure: LAPAROSCOPIC VERSUS OPEN LYSIS OF ADHESIONS AND INCISIONAL HERNIA REPAIR WITH MESH;  Surgeon: Ralene Ok, MD;  Location: [place];  Service: General;  Laterality: N/A;   [place]  10/08/2017   OPEN   INCISIONAL HERNIA REPAIR N/A 10/08/2017   Procedure: [place];  Surgeon: Ralene Ok, MD;  Location: [place];  Service: General;  Laterality: N/A;   INSERTION OF MESH N/A 12/23/2016   Procedure: INSERTION OF MESH;  Surgeon: Ralene Ok, MD;  Location: [place];  Service: General;  Laterality: N/A;   INSERTION OF MESH N/A 01/10/2017   Procedure: INSERTION OF MESH;  Surgeon: Ralene Ok, MD;  Location: [place];  Service: General;  Laterality: N/A;   INSERTION OF MESH N/A 10/08/2017   Procedure: INSERTION OF MESH;  Surgeon: Ralene Ok, MD;  Location: [place];  Service: General;  Laterality: N/A;   LAPAROSCOPIC NEPHRECTOMY Left 04/18/2016   Procedure: LAPAROSCOPIC RADICAL  LEFT NEPHRECTOMY;  Surgeon: Raynelle Bring, MD;  Location: [facility];  Service: Urology;  Laterality: Left;   LAPAROSCOPY N/A 01/10/2017   Procedure: LAPAROSCOPY DIAGNOSTIC , POSSIBLE LAPAROTOMY;  Surgeon: Ralene Ok, MD;  Location: [place];  Service: General;  Laterality: N/A;   NASAL SINUS SURGERY     left side   NASAL SINUS SURGERY Left 10/25/2019   Procedure: ENDOSCOPIC SINUS SURGERY/ethmoid and maxillary;  Surgeon: Izora Gala, MD;  Location: [place];  Service: ENT;  Laterality: Left;   THYROIDECTOMY, PARTIAL  1998   left small amount on left side-for cancer   TONSILLECTOMY      Current Facility-Administered Medications  Medication Dose Route Frequency Provider Last Rate Last Admin   ceFAZolin (ANCEF) IVPB 2g/100 mL premix  2 g Intravenous On Call to OR Magnant, Charles L, PA-C       lactated ringers infusion   Intravenous Continuous Hodierne, Adam, MD       povidone-iodine (BETADINE) 7.5 % scrub    Topical Once Magnant, Charles L, PA-C       tranexamic acid (CYKLOKAPRON) 2,000 mg in sodium chloride 0.9 % 50 mL Topical Application  9,476 mg Topical To OR Marlou Sa, Tonna Corner, MD       tranexamic acid (CYKLOKAPRON) IVPB 1,000 mg  1,000 mg Intravenous To OR Magnant, Charles L, PA-C       Allergies  Allergen Reactions   Tamsulosin Nausea And Vomiting and Other (See Comments)    Uncomfortable in pelvic region   Sulfa Antibiotics Rash    Social History   Tobacco Use   Smoking status: Never   Smokeless tobacco: Never  Substance Use Topics   Alcohol use: No    Family History  Problem Relation Age of Onset   Dementia Mother    Coronary artery disease Father 43       Longstanding smoker   Heart attack Father 19       Second MI was at 21   Hypertension Father    Hyperlipidemia Father    Heart attack Brother 26       Sudden cardiac death from MI   Healthy  Son      Review of Systems  Musculoskeletal:  Positive for arthralgias.  All other systems reviewed and are negative.   Objective:  Physical Exam Vitals reviewed.  HENT:     Head: Normocephalic.     Nose: Nose normal.     Mouth/Throat:     Mouth: Mucous membranes are moist.  Eyes:     Pupils: Pupils are equal, round, and reactive to light.  Cardiovascular:     Rate and Rhythm: Normal rate.     Pulses: Normal pulses.  Pulmonary:     Effort: Pulmonary effort is normal.  Abdominal:     General: Abdomen is flat.  Musculoskeletal:     Cervical back: Normal range of motion.  Skin:    General: Skin is warm.     Capillary Refill: Capillary refill takes less than 2 seconds.  Neurological:     General: No focal deficit present.     Mental Status: He is alert.  Psychiatric:        Mood and Affect: Mood normal.   Ortho exam demonstrates mild effusion in the right knee. Normal alignment. Pedal pulses palpable. Ankle dorsiflexion intact. Has patellofemoral crepitus. Both medial and lateral joint line tenderness is  present. Range of motion is about 0-1 10. No groin pain on the right with internal/external rotation of the leg.  Vital signs in last 24 hours: Temp:  [98.8 F (37.1 C)] 98.8 F (37.1 C) (01/09 0544) Pulse Rate:  [80] 80 (01/09 0544) Resp:  [18] 18 (01/09 0544) BP: (142)/(78) 142/78 (01/09 0544) SpO2:  [96 %] 96 % (01/09 0544) Weight:  [88.5 kg] 88.5 kg (01/09 0544)  Labs:   Estimated body mass index is 24.37 kg/m as calculated from the following:   Height as of this encounter: '6\' 3"'$  (1.905 m).   Weight as of this encounter: 88.5 kg.   Imaging Review Plain radiographs demonstrate severe degenerative joint disease of the right knee(s).  Primarily affecting the patellofemoral compartment but also including the medial and lateral compartments.  The overall alignment isneutral. The bone quality appears to be good for age and reported activity level.      Assessment/Plan:  End stage arthritis, right knee   The patient history, physical examination, clinical judgment of the provider and imaging studies are consistent with end stage degenerative joint disease of the right knee(s) and total knee arthroplasty is deemed medically necessary. The treatment options including medical management, injection therapy arthroscopy and arthroplasty were discussed at length. The risks and benefits of total knee arthroplasty were presented and reviewed. The risks due to aseptic loosening, infection, stiffness, patella tracking problems, thromboembolic complications and other imponderables were discussed. The patient acknowledged the explanation, agreed to proceed with the plan and consent was signed. Patient is being admitted for inpatient treatment for surgery, pain control, PT, OT, prophylactic antibiotics, VTE prophylaxis, progressive ambulation and ADL's and discharge planning. The patient is planning to be discharged home with home health services     Patient's anticipated LOS is less than 2  midnights, meeting these requirements: - Younger than 78 - Lives within 1 hour of care - Has a competent adult at home to recover with post-op recover - NO history of  - Chronic pain requiring opiods  - Diabetes  - Coronary Artery Disease  - Heart failure  - Heart attack  - Stroke  - DVT/VTE  - Cardiac arrhythmia  - Respiratory Failure/COPD  - Renal failure  - Anemia  -  Advanced Liver disease

## 2022-11-19 NOTE — Anesthesia Procedure Notes (Signed)
Anesthesia Regional Block: Adductor canal block   Pre-Anesthetic Checklist: , timeout performed,  Correct Patient, Correct Site, Correct Laterality,  Correct Procedure, Correct Position, site marked,  Risks and benefits discussed,  Surgical consent,  Pre-op evaluation,  At surgeon's request and post-op pain management  Laterality: Right  Prep: chloraprep       Needles:  Injection technique: Single-shot  Needle Type: Echogenic Needle     Needle Length: 9cm  Needle Gauge: 21     Additional Needles:   Narrative:  Start time: 11/19/2022 7:07 AM End time: 11/19/2022 7:15 AM Injection made incrementally with aspirations every 5 mL.  Performed by: Personally  Anesthesiologist: Albertha Ghee, MD  Additional Notes: Pt tolerated the procedure well.

## 2022-11-19 NOTE — Transfer of Care (Signed)
Immediate Anesthesia Transfer of Care Note  Patient: Eric Suarez  Procedure(s) Performed: RIGHT TOTAL KNEE ARTHROPLASTY (Right: Knee)  Patient Location: PACU  Anesthesia Type:Spinal and MAC combined with regional for post-op pain  Level of Consciousness: awake, alert , oriented, patient cooperative, and responds to stimulation  Airway & Oxygen Therapy: Patient Spontanous Breathing and Patient connected to nasal cannula oxygen  Post-op Assessment: Report given to RN and Post -op Vital signs reviewed and stable  Post vital signs: Reviewed and stable  Last Vitals:  Vitals Value Taken Time  BP 103/60 11/19/22 1010  Temp    Pulse 63 11/19/22 1011  Resp 9 11/19/22 1011  SpO2 98 % 11/19/22 1011  Vitals shown include unvalidated device data.  Last Pain:  Vitals:   11/19/22 0625  TempSrc:   PainSc: 0-No pain      Patients Stated Pain Goal: 2 (95/07/22 5750)  Complications: No notable events documented.

## 2022-11-19 NOTE — Anesthesia Procedure Notes (Signed)
Spinal  Patient location during procedure: OR Start time: 11/19/2022 7:36 AM End time: 11/19/2022 7:38 AM Reason for block: surgical anesthesia Staffing Performed: anesthesiologist  Anesthesiologist: Albertha Ghee, MD Performed by: Albertha Ghee, MD Authorized by: Albertha Ghee, MD   Preanesthetic Checklist Completed: patient identified, IV checked, risks and benefits discussed, surgical consent, monitors and equipment checked, pre-op evaluation and timeout performed Spinal Block Patient position: sitting Prep: DuraPrep Patient monitoring: cardiac monitor, continuous pulse ox and blood pressure Approach: midline Location: L3-4 Injection technique: single-shot Needle Needle type: Pencan  Needle gauge: 24 G Needle length: 9 cm Assessment Sensory level: T10 Events: CSF return Additional Notes Functioning IV was confirmed and monitors were applied. Sterile prep and drape, including hand hygiene and sterile gloves were used. The patient was positioned and the spine was prepped. The skin was anesthetized with lidocaine.  Free flow of clear CSF was obtained prior to injecting local anesthetic into the CSF.  The spinal needle aspirated freely following injection.  The needle was carefully withdrawn.  The patient tolerated the procedure well.

## 2022-11-19 NOTE — Anesthesia Procedure Notes (Signed)
Date/Time: 11/19/2022 7:30 AM  Performed by: Michele Rockers, CRNAPre-anesthesia Checklist: Patient identified, Emergency Drugs available, Suction available, Timeout performed and Patient being monitored Patient Re-evaluated:Patient Re-evaluated prior to induction Oxygen Delivery Method: Simple face mask

## 2022-11-20 DIAGNOSIS — M1711 Unilateral primary osteoarthritis, right knee: Secondary | ICD-10-CM | POA: Diagnosis not present

## 2022-11-20 MED ORDER — ACETAMINOPHEN 325 MG PO TABS: 325.0000 mg | ORAL_TABLET | Freq: Four times a day (QID) | ORAL | 0 refills | Status: AC | PRN

## 2022-11-20 MED ORDER — OXYCODONE HCL 5 MG PO TABS: 5.0000 mg | ORAL_TABLET | ORAL | 0 refills | Status: DC | PRN

## 2022-11-20 MED ORDER — METHOCARBAMOL 500 MG PO TABS: 500.0000 mg | ORAL_TABLET | Freq: Four times a day (QID) | ORAL | 0 refills | Status: DC | PRN

## 2022-11-20 MED ORDER — ASPIRIN 81 MG PO CHEW: 81.0000 mg | CHEWABLE_TABLET | Freq: Two times a day (BID) | ORAL | 0 refills | Status: DC

## 2022-11-20 NOTE — Plan of Care (Signed)
?  Problem: Clinical Measurements: ?Goal: Will remain free from infection ?Outcome: Progressing ?  ?

## 2022-11-20 NOTE — Progress Notes (Signed)
Physical Therapy Treatment Patient Details Name: Eric Suarez MRN: [id number] DOB: March 09, 1946 Today's Date: 11/20/2022   History of Present Illness 77 y.o. male presents to [facility] hospital on 11/19/2022 for elective R TKA. PMH includes anxiety, BPH, CKD, GERD, CAD, HTN, macular degeneration, RCC.    PT Comments    Pt received seated EOB to eat lunch, pt with KI doffed and only minimal c/o pain, despite no premedication. Pt eager to work on transfers, gait and stair training with RW. Pt needing increased assist with KI donned for sit<>stand from elevated surfaces, discussed with Dr Marlou Sa. Pt with good R quad strength and able to perform 15 straight leg raises without assist and no R quad extensor lag observed, per MD OK to keep KI doffed as long as pt using RW. Pt spouse entered room during session and instructed on guarding positions with him with gait belt (gt belt given for him to use for OOB) and stair/gait sequencing with RW. HEP handout reviewed and pt/spouse receptive. Pt cleared from a PT standpoint for DC home once medically cleared, RN notified.  Recommendations for follow up therapy are one component of a multi-disciplinary discharge planning process, led by the attending physician.  Recommendations may be updated based on patient status, additional functional criteria and insurance authorization.  Follow Up Recommendations  Follow physician's recommendations for discharge plan and follow up therapies     Assistance Recommended at Discharge Intermittent Supervision/Assistance  Patient can return home with the following A little help with walking and/or transfers;A little help with bathing/dressing/bathroom;Assistance with cooking/housework;Assist for transportation;Help with stairs or ramp for entrance   Equipment Recommendations  BSC/3in1;Other (comment) (for ease of getting up off toilet when pt using KI, pt has difficulty standing from low surfaces and his wife is 15f 10" so will  having difficulty boosting him well.)    Recommendations for Other Services       Precautions / Restrictions Precautions Precautions: Fall;Knee Precaution Booklet Issued: Yes (comment) Required Braces or Orthoses: Knee Immobilizer - Right Knee Immobilizer - Right: Discontinue once straight leg raise with < 10 degree lag;Other (comment) (as long as pt compliant with using RW per MD verbal order when seen in hallway 1/10, also can wear at rest if he is having trouble keeping knee straight while sleeping) Restrictions Weight Bearing Restrictions: Yes RLE Weight Bearing: Weight bearing as tolerated Other Position/Activity Restrictions: use RW any time he is up OOB     Mobility  Bed Mobility Overal bed mobility: Needs Assistance Bed Mobility: Supine to Sit, Sit to Supine     Supine to sit: Supervision, HOB elevated Sit to supine: Min guard   General bed mobility comments: min guard when not using rails, pt/spouse instructed on using gait belt as leg lifter PRN vs spouse assist to support BLE vs other leg hooked under to carry "sore" leg up into bed.    Transfers Overall transfer level: Needs assistance Equipment used: Rolling walker (2 wheels) Transfers: Sit to/from Stand Sit to Stand: Min assist, From elevated surface, Min guard           General transfer comment: pt requires physical assistance to power up into standing. pt did better when KI doffed (after MD clearance given) on subsequent transfers, pt spouse also instructed on safe UE/LE placement. pt stood from elevated bed and lower mat table/chair heights<>RW, needs reminders each transfer not to pull up on RW and wife instructed    Ambulation/Gait Ambulation/Gait assistance: Min guard Gait Distance (Feet): 200  Feet (seated break halfway through to remove KI per MD clearance) Assistive device: Rolling walker (2 wheels) Gait Pattern/deviations: Step-through pattern, Step-to pattern Gait velocity: reduced     General  Gait Details: slowed step-through gait, pt had KI donned for ~161f, then after MD clearance to trial without KI, pt RLE remains stable without buckling. min cues intermittently for safe RW proximity and using BUE to offload sore leg. Pt spouse present and able to demo back safe guarding position with gait belt and also reviewed safety when getting home, they have plan to keep his medium size dog in yard when pt getting into home and when pt mobilizing initially.   Stairs Stairs: Yes Stairs assistance: Min guard Stair Management: Two rails, Step to pattern, Forwards Number of Stairs: 5 General stair comments: pt ascended/descended steps of varying heights in PT gym, no LOB or buckling, pt given teach-back but poor recall of step sequencing, pt spouse also instructed on sequencing and handout underlined/starred where this info is written, pt/spouse receptive. no buckling or LOB.   Wheelchair Mobility    Modified Rankin (Stroke Patients Only)       Balance Overall balance assessment: Needs assistance Sitting-balance support: No upper extremity supported, Feet supported Sitting balance-Leahy Scale: Good     Standing balance support: Reliant on assistive device for balance, Single extremity supported Standing balance-Leahy Scale: Poor Standing balance comment: reliant on RW due to pain, pt able to let go of single UE briefly in static stance but encouraged him to maintain BUE on handles at all times for safety                            Cognition Arousal/Alertness: Awake/alert Behavior [facility] Overall Cognitive Status:  (pt states "my memory isn't what it was", per spouse he is likely at or close to his baseline)                                 General Comments: pt admits to some short term memory difficulties more recently, needed frequent cues for step sequencing when ascending/descending stairs and despite multiple  reviews, pt continues to forget. pt and spouse shown where sequencing also written in HEP page to reinforce, this was starred/underlined for them. Pt spouse given instruction on guarding and safety with transfers/stairs and receptive to all instruction.        Exercises Total Joint Exercises Ankle Circles/Pumps: AROM, Both, 10 reps, Seated Quad Sets: AROM, AAROM, Right, 10 reps, Supine Short Arc Quad:  (vcs and visual demo) Heel Slides: AROM, AAROM, Right, 5 reps, Supine Hip ABduction/ADduction: AROM, AAROM, Right, 5 reps, Supine Straight Leg Raises: AROM, Right, Supine, 15 reps (no assist needed, tactile cues to prevent him lifting RLE too high) Long Arc Quad: AROM, Right, 5 reps, Seated Knee Flexion: AROM, Right, 5 reps, Seated Goniometric ROM: R knee flexion 0 to 85 deg seated EOB    General Comments General comments (skin integrity, edema, etc.): VSS per chart review, no acute s/sx distress      Pertinent Vitals/Pain Pain Assessment Pain Assessment: Faces Faces Pain Scale: Hurts little more Pain Location: R knee Pain Descriptors / Indicators: Aching, Operative site guarding Pain Intervention(s): Monitored during session, Repositioned, Ice applied     PT Goals (current goals can now be found in the care plan section) Acute Rehab PT Goals Patient Stated  Goal: to return to independence PT Goal Formulation: With patient Time For Goal Achievement: 11/24/22 Progress towards PT goals: Progressing toward goals    Frequency    7X/week      PT Plan Current plan remains appropriate       AM-PAC PT "6 Clicks" Mobility   Outcome Measure  Help needed turning from your back to your side while in a flat bed without using bedrails?: None Help needed moving from lying on your back to sitting on the side of a flat bed without using bedrails?: A Little Help needed moving to and from a bed to a chair (including a wheelchair)?: A Little Help needed standing up from a chair using  your arms (e.g., wheelchair or bedside chair)?: A Little Help needed to walk in hospital room?: A Little Help needed climbing 3-5 steps with a railing? : A Lot (mod cues for safety) 6 Click Score: 18    End of Session Equipment Utilized During Treatment: Right knee immobilizer;Gait belt (progressed to no KI per MD clearance) Activity Tolerance: Patient tolerated treatment well Patient left: in chair;with call bell/phone within reach;with chair alarm set;with family/visitor present;Other (comment) (spouse present and case mgmt personnel in room) Nurse Communication: Mobility status PT Visit Diagnosis: Other abnormalities of gait and mobility (R26.89);Muscle weakness (generalized) (M62.81);History of falling (Z91.81)     Time: 9798-9211 PT Time Calculation (min) (ACUTE ONLY): 45 min  Charges:  $Gait Training: 8-22 mins $Therapeutic Exercise: 8-22 mins $Therapeutic Activity: 8-22 mins                     Millisa Giarrusso P., PTA Acute Rehabilitation Services Secure Chat Preferred 9a-5:30pm Office: [place] 11/20/2022, 1:59 PM

## 2022-11-20 NOTE — Anesthesia Postprocedure Evaluation (Signed)
Anesthesia Post Note  Patient: Eric Suarez  Procedure(s) Performed: RIGHT TOTAL KNEE ARTHROPLASTY (Right: Knee)     Patient location during evaluation: PACU Anesthesia Type: MAC, Spinal and Regional Level of consciousness: oriented and awake and alert Pain management: pain level controlled Vital Signs Assessment: post-procedure vital signs reviewed and stable Respiratory status: spontaneous breathing, respiratory function stable and patient connected to nasal cannula oxygen Cardiovascular status: blood pressure returned to baseline and stable Postop Assessment: no headache, no backache and no apparent nausea or vomiting Anesthetic complications: no   No notable events documented.  Last Vitals:  Vitals:   11/20/22 0000 11/20/22 0516  BP: (!) 98/58 121/73  Pulse: 68 (!) 59  Resp: 16 16  Temp: 36.7 C 36.6 C  SpO2: 99% 99%    Last Pain:  Vitals:   11/20/22 0516  TempSrc: Oral  PainSc:                  [place]

## 2022-11-20 NOTE — TOC Transition Note (Signed)
Transition of Care [facility]) - CM/SW Discharge Note   Patient Details  Name: Eric Suarez MRN: [id number] Date of Birth: 1946/01/28  Transition of Care [facility] CM/SW Contact:  Sharin Mons, RN Phone Number: 11/20/2022, 1:43 PM   Clinical Narrative:    Patient will DC to: home Anticipated DC date: 11/20/2022 Family notified: yes Transport by: car         -  s/p RIGHT TOTAL KNEE ARTHROPLASTY, 11/19/2022 Per MD patient ready for DC today . RN, patient, patient's wife and [facility] notified of DC. Pt without DME needs. Post hospital f/u noted on AVS. Pt without RX med concerns. Wife to provide transportation to home.  RNCM will sign off for now as intervention is no longer needed. Please consult [place] again if new needs arise.    Final next level of care: [place] Barriers to Discharge: No Barriers Identified   Patient Goals and CMS Choice      Discharge Placement                         Discharge Plan and Services Additional resources added to the After Visit Summary for                                       Social Determinants of Health (SDOH) Interventions SDOH Screenings   Tobacco Use: Low Risk  (11/19/2022)     Readmission Risk Interventions     No data to display

## 2022-11-20 NOTE — Progress Notes (Signed)
Ok to [employer] home this afternoon following PT and stair work

## 2022-11-20 NOTE — Progress Notes (Signed)
  Subjective: Patient stable.  Pain controlled.  Did well with physical therapy yesterday.  Has not done stairs yet.   Objective: Vital signs in last 24 hours: Temp:  [97.6 F (36.4 C)-98 F (36.7 C)] 97.9 F (36.6 C) (01/10 0848) Pulse Rate:  [59-78] 64 (01/10 0848) Resp:  [16-18] 18 (01/10 0848) BP: (98-121)/(58-73) 109/65 (01/10 0848) SpO2:  [97 %-99 %] 98 % (01/10 0848)  Intake/Output from previous day: 01/09 0701 - 01/10 0700 In: 6811 [P.O.:120; I.V.:1200; IV Piggyback:450] Out: 1550 [Urine:1500; Blood:50] Intake/Output this shift: No intake/output data recorded.  Exam:  Patient can do straight leg raise on the right.  Incision intact.  Right foot perfused and sensate and mobile.  Labs: No results for input(s): "HGB" in the last 72 hours. No results for input(s): "WBC", "RBC", "HCT", "PLT" in the last 72 hours. No results for input(s): "NA", "K", "CL", "CO2", "BUN", "CREATININE", "GLUCOSE", "CALCIUM" in the last 72 hours. No results for input(s): "LABPT", "INR" in the last 72 hours.  Assessment/Plan: Plan at this time is likely discharge to home today after physical therapy this afternoon.  Patient has CPM machine at home.  Follow-up in 2 weeks.   G Scott Redina Zeller 11/20/2022, 11:48 AM

## 2022-11-21 ENCOUNTER — Encounter (HOSPITAL_COMMUNITY): Payer: Self-pay | Admitting: Orthopedic Surgery

## 2022-11-21 ENCOUNTER — Telehealth: Payer: Self-pay | Admitting: *Deleted

## 2022-11-21 DIAGNOSIS — H3530 Unspecified macular degeneration: Secondary | ICD-10-CM | POA: Diagnosis not present

## 2022-11-21 DIAGNOSIS — N183 Chronic kidney disease, stage 3 unspecified: Secondary | ICD-10-CM | POA: Diagnosis not present

## 2022-11-21 DIAGNOSIS — I129 Hypertensive chronic kidney disease with stage 1 through stage 4 chronic kidney disease, or unspecified chronic kidney disease: Secondary | ICD-10-CM | POA: Diagnosis not present

## 2022-11-21 DIAGNOSIS — Z85850 Personal history of malignant neoplasm of thyroid: Secondary | ICD-10-CM | POA: Diagnosis not present

## 2022-11-21 DIAGNOSIS — Z471 Aftercare following joint replacement surgery: Secondary | ICD-10-CM | POA: Diagnosis not present

## 2022-11-21 DIAGNOSIS — Z96651 Presence of right artificial knee joint: Secondary | ICD-10-CM | POA: Diagnosis not present

## 2022-11-21 DIAGNOSIS — Z7982 Long term (current) use of aspirin: Secondary | ICD-10-CM | POA: Diagnosis not present

## 2022-11-21 DIAGNOSIS — N401 Enlarged prostate with lower urinary tract symptoms: Secondary | ICD-10-CM | POA: Diagnosis not present

## 2022-11-21 DIAGNOSIS — F419 Anxiety disorder, unspecified: Secondary | ICD-10-CM | POA: Diagnosis not present

## 2022-11-21 DIAGNOSIS — G8929 Other chronic pain: Secondary | ICD-10-CM | POA: Diagnosis not present

## 2022-11-21 DIAGNOSIS — N39498 Other specified urinary incontinence: Secondary | ICD-10-CM | POA: Diagnosis not present

## 2022-11-21 DIAGNOSIS — E039 Hypothyroidism, unspecified: Secondary | ICD-10-CM | POA: Diagnosis not present

## 2022-11-21 DIAGNOSIS — M545 Low back pain, unspecified: Secondary | ICD-10-CM | POA: Diagnosis not present

## 2022-11-21 DIAGNOSIS — E782 Mixed hyperlipidemia: Secondary | ICD-10-CM | POA: Diagnosis not present

## 2022-11-21 DIAGNOSIS — Z9181 History of falling: Secondary | ICD-10-CM | POA: Diagnosis not present

## 2022-11-21 DIAGNOSIS — I272 Pulmonary hypertension, unspecified: Secondary | ICD-10-CM | POA: Diagnosis not present

## 2022-11-21 DIAGNOSIS — K219 Gastro-esophageal reflux disease without esophagitis: Secondary | ICD-10-CM | POA: Diagnosis not present

## 2022-11-21 DIAGNOSIS — I2510 Atherosclerotic heart disease of native coronary artery without angina pectoris: Secondary | ICD-10-CM | POA: Diagnosis not present

## 2022-11-21 DIAGNOSIS — Z85528 Personal history of other malignant neoplasm of kidney: Secondary | ICD-10-CM | POA: Diagnosis not present

## 2022-11-21 DIAGNOSIS — Z902 Acquired absence of lung [part of]: Secondary | ICD-10-CM | POA: Diagnosis not present

## 2022-11-21 NOTE — Telephone Encounter (Signed)
Ortho bundle D/C call completed. 

## 2022-11-27 DIAGNOSIS — M1711 Unilateral primary osteoarthritis, right knee: Secondary | ICD-10-CM | POA: Diagnosis not present

## 2022-11-27 DIAGNOSIS — Z96651 Presence of right artificial knee joint: Secondary | ICD-10-CM | POA: Diagnosis not present

## 2022-11-27 NOTE — Discharge Summary (Signed)
Physician Discharge Summary      Patient ID: Eric Suarez MRN: [id number] DOB/AGE: 1946/04/25 77 y.o.  Admit date: 11/19/2022 Discharge date: 11/27/2022  Admission Diagnoses:  Principal Problem:   OA (osteoarthritis) of knee Active Problems:   Arthritis of knee   Discharge Diagnoses:  Same  Surgeries: Procedure(s): RIGHT TOTAL KNEE ARTHROPLASTY on 11/19/2022   Consultants:   Discharged Condition: Stable  Hospital Course: Eric Suarez is an 77 y.o. male who was admitted 11/19/2022 with a chief complaint of right knee pain., and found to have a diagnosis of OA (osteoarthritis) of knee.  They were brought to the operating room on 11/19/2022 and underwent the above named procedures.  Patient tolerated the procedure well without immediate complications.  Mobilize very well with physical therapy and his pain was controlled on postop day #1.  He is discharged to home in good condition.  Plan to use CPM machine after surgery.  Aspirin for DVT prophylaxis along with oxycodone and muscle relaxer for postop pain relief.  Follow-up in 2 weeks for clinical recheck.  Antibiotics given:  Anti-infectives (From admission, onward)    Start     Dose/Rate Route Frequency Ordered Stop   11/19/22 1700  ceFAZolin (ANCEF) IVPB 1 g/50 mL premix        1 g 100 mL/hr over 30 Minutes Intravenous Every 6 hours 11/19/22 1652 11/20/22 0031   11/19/22 1330  ceFAZolin (ANCEF) IVPB 1 g/50 mL premix  Status:  Discontinued        1 g 100 mL/hr over 30 Minutes Intravenous Every 6 hours 11/19/22 1049 11/19/22 1652   11/19/22 0859  vancomycin (VANCOCIN) powder  Status:  Discontinued          As needed 11/19/22 0900 11/19/22 1015   11/19/22 0615  ceFAZolin (ANCEF) IVPB 2g/100 mL premix        2 g 200 mL/hr over 30 Minutes Intravenous On call to O.R. 11/19/22 0600 11/19/22 0756     .  Recent vital signs:  Vitals:   11/20/22 0516 11/20/22 0848  BP: 121/73 109/65  Pulse: (!) 59 64  Resp: 16 18  Temp: 97.9  F (36.6 C) 97.9 F (36.6 C)  SpO2: 99% 98%    Recent laboratory studies:  Results for orders placed or performed during the hospital encounter of 11/05/22  Urine Culture   Specimen: Urine, Clean Catch  Result Value Ref Range   Specimen Description URINE, CLEAN CATCH    Special Requests NONE    Culture      NO GROWTH Performed at [facility], [place] [address] [place], [place] [address]    Report Status 11/06/2022 FINAL   Surgical pcr screen   Specimen: Nasal Mucosa; Nasal Swab  Result Value Ref Range   MRSA, PCR NEGATIVE NEGATIVE   Staphylococcus aureus NEGATIVE NEGATIVE  CBC  Result Value Ref Range   WBC 6.7 4.0 - 10.5 K/uL   RBC 4.11 (L) 4.22 - 5.81 MIL/uL   Hemoglobin 12.3 (L) 13.0 - 17.0 g/dL   HCT 40.2 39.0 - 52.0 %   MCV 97.8 80.0 - 100.0 fL   MCH 29.9 26.0 - 34.0 pg   MCHC 30.6 30.0 - 36.0 g/dL   RDW 14.7 11.5 - 15.5 %   Platelets 122 (L) 150 - 400 K/uL   nRBC 0.0 0.0 - 0.2 %  Basic metabolic panel  Result Value Ref Range   Sodium 142 135 - 145 mmol/L   Potassium 4.2 3.5 -  5.1 mmol/L   Chloride 110 98 - 111 mmol/L   CO2 25 22 - 32 mmol/L   Glucose, Bld 123 (H) 70 - 99 mg/dL   BUN 12 8 - 23 mg/dL   Creatinine, Ser 1.32 (H) 0.61 - 1.24 mg/dL   Calcium 8.6 (L) 8.9 - 10.3 mg/dL   GFR, Estimated 56 (L) >60 mL/min   Anion gap 7 5 - 15  Urinalysis, Complete w Microscopic  Result Value Ref Range   Color, Urine YELLOW YELLOW   APPearance CLEAR CLEAR   Specific Gravity, Urine 1.019 1.005 - 1.030   pH 5.0 5.0 - 8.0   Glucose, UA NEGATIVE NEGATIVE mg/dL   Hgb urine dipstick NEGATIVE NEGATIVE   Bilirubin Urine NEGATIVE NEGATIVE   Ketones, ur NEGATIVE NEGATIVE mg/dL   Protein, ur NEGATIVE NEGATIVE mg/dL   Nitrite NEGATIVE NEGATIVE   Leukocytes,Ua TRACE (A) NEGATIVE   RBC / HPF 0-5 0 - 5 RBC/hpf   WBC, UA 6-10 0 - 5 WBC/hpf   Bacteria, UA NONE SEEN NONE SEEN   Squamous Epithelial / HPF 0-5 0 - 5   Mucus PRESENT    Hyaline Casts, UA PRESENT      Discharge Medications:   Allergies as of 11/20/2022       Reactions   Tamsulosin Nausea And Vomiting, Other (See Comments)   Uncomfortable in pelvic region   Rosuvastatin Calcium Other (See Comments)   Sulfa Antibiotics Rash        Medication List     TAKE these medications    acetaminophen 325 MG tablet Commonly known as: TYLENOL Take 1-2 tablets (325-650 mg total) by mouth every 6 (six) hours as needed for mild pain (pain score 1-3 or temp > 100.5). What changed:  how much to take reasons to take this   alfuzosin 10 MG 24 hr tablet Commonly known as: UROXATRAL Take 10 mg daily by mouth.   aspirin 81 MG chewable tablet Chew 1 tablet (81 mg total) by mouth 2 (two) times daily. What changed: when to take this   atenolol-chlorthalidone 50-25 MG tablet Commonly known as: TENORETIC Take 1 tablet by mouth daily.   dutasteride 0.5 MG capsule Commonly known as: AVODART Take 0.5 mg daily by mouth.   EYE VITAMINS PO Take 1 tablet by mouth daily.   ezetimibe 10 MG tablet Commonly known as: ZETIA Take 10 mg by mouth daily.   gabapentin 300 MG capsule Commonly known as: NEURONTIN Take 300 mg by mouth 2 (two) times daily.   levothyroxine 125 MCG tablet Commonly known as: SYNTHROID Take 125 mcg by mouth daily before breakfast.   methocarbamol 500 MG tablet Commonly known as: ROBAXIN Take 1 tablet (500 mg total) by mouth every 6 (six) hours as needed for muscle spasms.   nitroGLYCERIN 0.4 MG SL tablet Commonly known as: NITROSTAT Place 0.4 mg under the tongue every 5 (five) minutes as needed for chest pain.   omeprazole 20 MG capsule Commonly known as: PRILOSEC Take 20 mg by mouth daily.   OVER THE COUNTER MEDICATION Take 2 tablets daily by mouth. Cognium - Brain Vitamin   oxyCODONE 5 MG immediate release tablet Commonly known as: Oxy IR/ROXICODONE Take 1-2 tablets (5-10 mg total) by mouth every 4 (four) hours as needed for moderate pain (pain score  4-6).   PARoxetine 25 MG 24 hr tablet Commonly known as: PAXIL-CR Take 25 mg by mouth daily.   potassium chloride SA 20 MEQ tablet Commonly known as: KLOR-CON M Take  20 mEq by mouth every other day.   Vitamin C 500 MG Chew Chew 500 mg by mouth daily.   Vitamin D3 25 MCG (1000 UT) Caps Take 1,000 Units by mouth daily.   Zinc 100 MG Tabs Take 1 tablet by mouth daily.        Diagnostic Studies: No results found.  Disposition: Discharge disposition: 01-Home or Self Care       Discharge Instructions     Call MD / Call 911   Complete by: As directed    If you experience chest pain or shortness of breath, CALL 911 and be transported to the hospital emergency room.  If you develope a fever above 101 F, pus (white drainage) or increased drainage or redness at the wound, or calf pain, call your surgeon's office.   Constipation Prevention   Complete by: As directed    Drink plenty of fluids.  Prune juice may be helpful.  You may use a stool softener, such as Colace (over the counter) 100 mg twice a day.  Use MiraLax (over the counter) for constipation as needed.   Diet - low sodium heart healthy   Complete by: As directed    Discharge instructions   Complete by: As directed    1.  CPM machine 1 hour 3 times a day minimum increasing the degrees daily #2 okay to shower.  Dressing is waterproof #3.  Okay for home health physical therapy to work on range of motion and strengthening #4 use knee immobilizer for ambulation but not necessarily when you are sitting and laying around the house.   Increase activity slowly as tolerated   Complete by: As directed    Post-operative opioid taper instructions:   Complete by: As directed    POST-OPERATIVE OPIOID TAPER INSTRUCTIONS: It is important to wean off of your opioid medication as soon as possible. If you do not need pain medication after your surgery it is ok to stop day one. Opioids include: Codeine, Hydrocodone(Norco, Vicodin),  Oxycodone(Percocet, oxycontin) and hydromorphone amongst others.  Long term and even short term use of opiods can cause: Increased pain response Dependence Constipation Depression Respiratory depression And more.  Withdrawal symptoms can include Flu like symptoms Nausea, vomiting And more Techniques to manage these symptoms Hydrate well Eat regular healthy meals Stay active Use relaxation techniques(deep breathing, meditating, yoga) Do Not substitute Alcohol to help with tapering If you have been on opioids for less than two weeks and do not have pain than it is ok to stop all together.  Plan to wean off of opioids This plan should start within one week post op of your joint replacement. Maintain the same interval or time between taking each dose and first decrease the dose.  Cut the total daily intake of opioids by one tablet each day Next start to increase the time between doses. The last dose that should be eliminated is the evening dose.           Follow-up Information     Deland Pretty, MD Follow up.   Specialty: Internal Medicine Contact information: [address] [place] [address] [phone]         [facility] [place] Follow up.   Specialty: [facility] Contact information: [place] [address] [phone]                  Signed: Anderson Malta 11/27/2022, 12:06 PM

## 2022-11-29 ENCOUNTER — Telehealth: Payer: Self-pay | Admitting: *Deleted

## 2022-11-29 NOTE — Telephone Encounter (Signed)
Ortho bundle 7 day call completed.

## 2022-11-30 DIAGNOSIS — M1711 Unilateral primary osteoarthritis, right knee: Secondary | ICD-10-CM

## 2022-12-04 ENCOUNTER — Ambulatory Visit (INDEPENDENT_AMBULATORY_CARE_PROVIDER_SITE_OTHER): Payer: PPO

## 2022-12-04 ENCOUNTER — Ambulatory Visit (INDEPENDENT_AMBULATORY_CARE_PROVIDER_SITE_OTHER): Payer: PPO | Admitting: Surgical

## 2022-12-04 DIAGNOSIS — Z96651 Presence of right artificial knee joint: Secondary | ICD-10-CM

## 2022-12-04 DIAGNOSIS — M25561 Pain in right knee: Secondary | ICD-10-CM | POA: Diagnosis not present

## 2022-12-04 DIAGNOSIS — R2689 Other abnormalities of gait and mobility: Secondary | ICD-10-CM | POA: Diagnosis not present

## 2022-12-04 DIAGNOSIS — M25461 Effusion, right knee: Secondary | ICD-10-CM | POA: Diagnosis not present

## 2022-12-04 DIAGNOSIS — M25661 Stiffness of right knee, not elsewhere classified: Secondary | ICD-10-CM | POA: Diagnosis not present

## 2022-12-05 ENCOUNTER — Encounter: Payer: Self-pay | Admitting: Surgical

## 2022-12-05 ENCOUNTER — Other Ambulatory Visit: Payer: Self-pay | Admitting: Surgical

## 2022-12-05 ENCOUNTER — Telehealth: Payer: Self-pay | Admitting: *Deleted

## 2022-12-05 DIAGNOSIS — R2689 Other abnormalities of gait and mobility: Secondary | ICD-10-CM | POA: Diagnosis not present

## 2022-12-05 DIAGNOSIS — M25461 Effusion, right knee: Secondary | ICD-10-CM | POA: Diagnosis not present

## 2022-12-05 DIAGNOSIS — M25661 Stiffness of right knee, not elsewhere classified: Secondary | ICD-10-CM | POA: Diagnosis not present

## 2022-12-05 DIAGNOSIS — M25561 Pain in right knee: Secondary | ICD-10-CM | POA: Diagnosis not present

## 2022-12-05 MED ORDER — OXYCODONE HCL 5 MG PO TABS: 5.0000 mg | ORAL_TABLET | Freq: Four times a day (QID) | ORAL | 0 refills | Status: DC | PRN

## 2022-12-05 NOTE — Progress Notes (Signed)
Post-Op Visit Note   Patient: Eric Suarez           Date of Birth: 1946/08/23           MRN: [id number] Visit Date: 12/04/2022 PCP: Deland Pretty, MD   Assessment & Plan:  Chief Complaint:  Chief Complaint  Patient presents with   Right Knee - Routine Post Op   Visit Diagnoses:  1. Status post right knee replacement     Plan: LEONELL LOBDELL is a 77 y.o. male who presents s/p right total knee arthroplasty on 11/19/2022.  Doing well overall.  Using CPM machine.  They deny any calf pain, shortness of breath, chest pain, abdominal pain.  Pain is overall controlled and taking oxycodone for pain control.  Taking aspirin twice daily for DVT prophylaxis.  Ambulating with walker..   On exam, patient has range of motion 8 degrees extension to 110 degrees of knee flexion.  Has soft endpoint with extension..  Incision is healing well without evidence of infection or dehiscence.  2+ DP pulse of the operative extremity.  No calf tenderness, negative Homans' sign.  Able to perform straight leg raise.  Intact ankle dorsiflexion.  Plan is to start outpatient physical therapy which he just started his first session today.  He finished home health physical therapy with them achieving 3 degrees of extension and 120 degrees of knee flexion at his best.  He seems to be progressing very well in regards to knee flexion but struggling a little bit with achieving full extension.  Recommended he emphasize exercises for this when he is doing home exercises.  Demonstrated best way to get full knee extension.  He may go down to aspirin once daily.  Follow-up with Dr. Marlou Sa in 4 weeks.  Refilled oxycodone..    Follow-Up Instructions: No follow-ups on file.   Orders:  Orders Placed This Encounter  Procedures   XR Knee 1-2 Views Right   No orders of the defined types were placed in this encounter.   Imaging: No results found.  PMFS History: Patient Active Problem List   Diagnosis Date Noted    Arthritis of right knee 11/30/2022   OA (osteoarthritis) of knee 11/19/2022   Arthritis of knee 11/19/2022   Pulmonary hypertension, unspecified ([place]) - suggested by CT 12/27/2021   Agatston coronary artery calcium score greater than 400 12/10/2021   Mixed dyslipidemia 12/10/2021   Family history of premature coronary artery disease: Father & Brother 12/10/2021   Nonspecific abnormal electrocardiogram (ECG) (EKG) 12/10/2021   Left main coronary artery disease 12/10/2021   Exercise intolerance - CRO Atypical Angina 12/10/2021   S/P hernia repair 10/08/2017   Chronic pain of both knees 62/56/3893   Folliculitis 73/42/8768   SBO (small bowel obstruction) ([place]) 01/10/2017   Post-operative complication 11/57/2620   Severe sepsis ([place]) 12/25/2016   Flu-like symptoms 12/25/2016   Dehydration with hyponatremia 12/25/2016   AKI (acute kidney injury) ([place]) 12/25/2016   CKD (chronic kidney disease) stage 3, GFR 30-59 ml/min ([place]) 12/25/2016   S/P repair of ventral hernia 12/25/2016   Hypothyroidism 12/25/2016   Essential hypertension 12/25/2016   Neoplasm of left kidney 04/18/2016   Postoperative examination 03/07/2016   Strangulated inguinal hernia 02/29/2016   Past Medical History:  Diagnosis Date   Anxiety    Arthritis    BPH (benign prostatic hyperplasia)    Cataracts, bilateral    Chronic back pain    Followed by Dr. Arnoldo Morale   CKD (chronic kidney disease)  stage 3, GFR 30-59 ml/min ([facility]) 09/2021   Creatinine 0.09   Complication of anesthesia    unable to void after surgery   Coronary artery disease 10/31/2021   Coronary Calcium Score 535   GERD (gastroesophageal reflux disease)    History of hiatal hernia    History of kidney surgery    left kidney removed due to cancer 2017   History of thyroid cancer 1998   Thyroidectomy-now on Synthroid   Hypertension    Hypothyroidism (acquired) 1998   Post thyroidectomy for thyroid cancer   Macular degeneration of both eyes    dry    Mixed dyslipidemia 09/27/2021   Low HDL, high triglycerides with normal LDL: TC 117, TG 135, HDL 25, LDL 56.   PONV (postoperative nausea and vomiting)    Renal cell carcinoma, left ([place]) 2017   Left nephrectomy-Dr. Sherilyn Banker, complicated by multiple different hernia surgeries.    Family History  Problem Relation Age of Onset   Dementia Mother    Coronary artery disease Father 63       Longstanding smoker   Heart attack Father 41       Second MI was at 74   Hypertension Father    Hyperlipidemia Father    Heart attack Brother 3       Sudden cardiac death from MI   Healthy Son     Past Surgical History:  Procedure Laterality Date   APPENDECTOMY     COLONOSCOPY     CYSTOSCOPY WITH RETROGRADE PYELOGRAM, URETEROSCOPY AND STENT PLACEMENT Left 04/01/2016   Procedure: CYSTOSCOPY WITH RETROGRADE PYELOGRAM, left ureter;  Surgeon: Raynelle Bring, MD;  Location: [facility];  Service: Urology;  Laterality: Left;   HEMORRHOID SURGERY     HERNIA REPAIR     inguinal-3 on left , 1 on right   INCISIONAL HERNIA REPAIR N/A 12/23/2016   Procedure: LAPAROSCOPIC VERSUS OPEN LYSIS OF ADHESIONS AND INCISIONAL HERNIA REPAIR WITH MESH;  Surgeon: Ralene Ok, MD;  Location: [place];  Service: General;  Laterality: N/A;   [place]  10/08/2017   OPEN   INCISIONAL HERNIA REPAIR N/A 10/08/2017   Procedure: [place];  Surgeon: Ralene Ok, MD;  Location: [place];  Service: General;  Laterality: N/A;   INSERTION OF MESH N/A 12/23/2016   Procedure: INSERTION OF MESH;  Surgeon: Ralene Ok, MD;  Location: [place];  Service: General;  Laterality: N/A;   INSERTION OF MESH N/A 01/10/2017   Procedure: INSERTION OF MESH;  Surgeon: Ralene Ok, MD;  Location: [place];  Service: General;  Laterality: N/A;   INSERTION OF MESH N/A 10/08/2017   Procedure: INSERTION OF MESH;  Surgeon: Ralene Ok, MD;  Location: [place];  Service: General;  Laterality: N/A;    LAPAROSCOPIC NEPHRECTOMY Left 04/18/2016   Procedure: LAPAROSCOPIC RADICAL  LEFT NEPHRECTOMY;  Surgeon: Raynelle Bring, MD;  [facility];  Service: Urology;  Laterality: Left;   LAPAROSCOPY N/A 01/10/2017   Procedure: LAPAROSCOPY DIAGNOSTIC , POSSIBLE LAPAROTOMY;  Surgeon: Ralene Ok, MD;  Location: [place];  Service: General;  Laterality: N/A;   NASAL SINUS SURGERY     left side   NASAL SINUS SURGERY Left 10/25/2019   Procedure: ENDOSCOPIC SINUS SURGERY/ethmoid and maxillary;  Surgeon: Izora Gala, MD;  Location: [facility];  Service: ENT;  Laterality: Left;   THYROIDECTOMY, PARTIAL  1998   left small amount on left side-for cancer   TONSILLECTOMY     TOTAL KNEE ARTHROPLASTY  Right 11/19/2022   Procedure: RIGHT TOTAL KNEE ARTHROPLASTY;  Surgeon: Meredith Pel, MD;  Location: [place];  Service: Orthopedics;  Laterality: Right;   Social History   Occupational History   Not on file  Tobacco Use   Smoking status: Never   Smokeless tobacco: Never  Vaping Use   Vaping Use: Never used  Substance and Sexual Activity   Alcohol use: No   Drug use: No   Sexual activity: Not on file

## 2022-12-05 NOTE — Telephone Encounter (Signed)
Patient's wife called and they never got refill of pain medication at this pharmacy. Could you send some in? Thank you.

## 2022-12-05 NOTE — Telephone Encounter (Signed)
Call to patient and updated. 

## 2022-12-05 NOTE — Telephone Encounter (Signed)
I sent in a refill for this prescription.  Sorry for the delay

## 2022-12-09 ENCOUNTER — Telehealth: Payer: Self-pay | Admitting: *Deleted

## 2022-12-09 DIAGNOSIS — M25461 Effusion, right knee: Secondary | ICD-10-CM | POA: Diagnosis not present

## 2022-12-09 DIAGNOSIS — R2689 Other abnormalities of gait and mobility: Secondary | ICD-10-CM | POA: Diagnosis not present

## 2022-12-09 DIAGNOSIS — M25661 Stiffness of right knee, not elsewhere classified: Secondary | ICD-10-CM | POA: Diagnosis not present

## 2022-12-09 DIAGNOSIS — M25561 Pain in right knee: Secondary | ICD-10-CM | POA: Diagnosis not present

## 2022-12-09 NOTE — Telephone Encounter (Signed)
Your plan sounds good thx

## 2022-12-09 NOTE — Telephone Encounter (Signed)
Patient called this morning to let me know that he went to sit on the toilet and his foot slid and twisted in an odd way and increased pain at that moment. He was seen last week and said he did it last week, but didn't mention it during his post op. He says it is no more swollen than it was, but feels like he has lost some of his ROM. He is not much of an ice user, so I told him to do his best with this. He sees the same therapist for OPPT today and I called them to let them know to call me if they feel like he is doing significantly different today. Since he is walking on it and that hasn't changed, I told him to continue with icing and elevation and taking his medication, but if it changes at all, to call and we'll see him sooner. Any other recommendations?

## 2022-12-13 DIAGNOSIS — R2689 Other abnormalities of gait and mobility: Secondary | ICD-10-CM | POA: Diagnosis not present

## 2022-12-13 DIAGNOSIS — M25661 Stiffness of right knee, not elsewhere classified: Secondary | ICD-10-CM | POA: Diagnosis not present

## 2022-12-13 DIAGNOSIS — M25561 Pain in right knee: Secondary | ICD-10-CM | POA: Diagnosis not present

## 2022-12-13 DIAGNOSIS — M25461 Effusion, right knee: Secondary | ICD-10-CM | POA: Diagnosis not present

## 2022-12-16 DIAGNOSIS — M25661 Stiffness of right knee, not elsewhere classified: Secondary | ICD-10-CM | POA: Diagnosis not present

## 2022-12-16 DIAGNOSIS — M25461 Effusion, right knee: Secondary | ICD-10-CM | POA: Diagnosis not present

## 2022-12-16 DIAGNOSIS — M25561 Pain in right knee: Secondary | ICD-10-CM | POA: Diagnosis not present

## 2022-12-16 DIAGNOSIS — R2689 Other abnormalities of gait and mobility: Secondary | ICD-10-CM | POA: Diagnosis not present

## 2022-12-18 DIAGNOSIS — M25661 Stiffness of right knee, not elsewhere classified: Secondary | ICD-10-CM | POA: Diagnosis not present

## 2022-12-18 DIAGNOSIS — R2689 Other abnormalities of gait and mobility: Secondary | ICD-10-CM | POA: Diagnosis not present

## 2022-12-18 DIAGNOSIS — M25461 Effusion, right knee: Secondary | ICD-10-CM | POA: Diagnosis not present

## 2022-12-18 DIAGNOSIS — M25561 Pain in right knee: Secondary | ICD-10-CM | POA: Diagnosis not present

## 2022-12-23 ENCOUNTER — Other Ambulatory Visit: Payer: Self-pay | Admitting: Surgical

## 2022-12-23 ENCOUNTER — Telehealth: Payer: Self-pay | Admitting: *Deleted

## 2022-12-23 MED ORDER — OXYCODONE HCL 5 MG PO TABS: 5.0000 mg | ORAL_TABLET | Freq: Two times a day (BID) | ORAL | 0 refills | Status: DC | PRN

## 2022-12-23 NOTE — Telephone Encounter (Signed)
Patient called and updated.

## 2022-12-23 NOTE — Telephone Encounter (Signed)
Patient called requesting refill of pain medication. States he is occasionally still using after PT. PT going well. He has only 1 left. Thanks.

## 2022-12-23 NOTE — Telephone Encounter (Signed)
Sent in

## 2022-12-24 DIAGNOSIS — M25561 Pain in right knee: Secondary | ICD-10-CM | POA: Diagnosis not present

## 2022-12-24 DIAGNOSIS — M25661 Stiffness of right knee, not elsewhere classified: Secondary | ICD-10-CM | POA: Diagnosis not present

## 2022-12-24 DIAGNOSIS — M25461 Effusion, right knee: Secondary | ICD-10-CM | POA: Diagnosis not present

## 2022-12-24 DIAGNOSIS — R2689 Other abnormalities of gait and mobility: Secondary | ICD-10-CM | POA: Diagnosis not present

## 2022-12-26 DIAGNOSIS — N210 Calculus in bladder: Secondary | ICD-10-CM | POA: Diagnosis not present

## 2022-12-26 DIAGNOSIS — N281 Cyst of kidney, acquired: Secondary | ICD-10-CM | POA: Diagnosis not present

## 2022-12-26 DIAGNOSIS — Z85528 Personal history of other malignant neoplasm of kidney: Secondary | ICD-10-CM | POA: Diagnosis not present

## 2022-12-27 DIAGNOSIS — M25461 Effusion, right knee: Secondary | ICD-10-CM | POA: Diagnosis not present

## 2022-12-27 DIAGNOSIS — M25561 Pain in right knee: Secondary | ICD-10-CM | POA: Diagnosis not present

## 2022-12-27 DIAGNOSIS — R2689 Other abnormalities of gait and mobility: Secondary | ICD-10-CM | POA: Diagnosis not present

## 2022-12-27 DIAGNOSIS — M25661 Stiffness of right knee, not elsewhere classified: Secondary | ICD-10-CM | POA: Diagnosis not present

## 2022-12-31 DIAGNOSIS — M25661 Stiffness of right knee, not elsewhere classified: Secondary | ICD-10-CM | POA: Diagnosis not present

## 2022-12-31 DIAGNOSIS — M25461 Effusion, right knee: Secondary | ICD-10-CM | POA: Diagnosis not present

## 2022-12-31 DIAGNOSIS — M25561 Pain in right knee: Secondary | ICD-10-CM | POA: Diagnosis not present

## 2022-12-31 DIAGNOSIS — R2689 Other abnormalities of gait and mobility: Secondary | ICD-10-CM | POA: Diagnosis not present

## 2023-01-01 ENCOUNTER — Encounter: Payer: Self-pay | Admitting: Orthopedic Surgery

## 2023-01-01 ENCOUNTER — Ambulatory Visit (INDEPENDENT_AMBULATORY_CARE_PROVIDER_SITE_OTHER): Payer: PPO | Admitting: Orthopedic Surgery

## 2023-01-01 DIAGNOSIS — Z96651 Presence of right artificial knee joint: Secondary | ICD-10-CM

## 2023-01-01 NOTE — Progress Notes (Signed)
Post-Op Visit Note   Patient: Eric Suarez           Date of Birth: 1945-12-02           MRN: [id number] Visit Date: 01/01/2023 PCP: Eric Pretty, MD   Assessment & Plan:  Chief Complaint:  Chief Complaint  Patient presents with   Right Knee - Follow-up    11/19/22 (6w 1d)Right Total Knee Arthroplasty     Visit Diagnoses:  1. Status post right knee replacement     Plan: Eric Suarez is a 77 year old patient is 6 weeks out right total knee replacement.  On examination he lacks about 7 degrees of full extension but has flexion easily to 120.  Incision intact.  Trace effusion.  No cane or walker.  Takes oxycodone sometimes especially after therapy.  Wants to do therapy on his own.  On examination no calf tenderness negative Homans and range of motion is stated.  Gait is essentially normal.  Plan at this time is to release Kell to activity as tolerated.  I think if he works a little bit on full extension he could have an incredible knee.  Follow-up with [place] as needed.  His knee currently does  look excellent.  Follow-Up Instructions: No follow-ups on file.   Orders:  No orders of the defined types were placed in this encounter.  No orders of the defined types were placed in this encounter.   Imaging: No results found.  PMFS History: Patient Active Problem List   Diagnosis Date Noted   Arthritis of right knee 11/30/2022   OA (osteoarthritis) of knee 11/19/2022   Arthritis of knee 11/19/2022   Pulmonary hypertension, unspecified ([place]) - suggested by CT 12/27/2021   Agatston coronary artery calcium score greater than 400 12/10/2021   Mixed dyslipidemia 12/10/2021   Family history of premature coronary artery disease: Father & Brother 12/10/2021   Nonspecific abnormal electrocardiogram (ECG) (EKG) 12/10/2021   Left main coronary artery disease 12/10/2021   Exercise intolerance - CRO Atypical Angina 12/10/2021   S/P hernia repair 10/08/2017   Chronic pain of both knees  [id number]   Folliculitis [id number]   SBO (small bowel obstruction) ([place]) 01/10/2017   Post-operative complication [id number]   Severe sepsis ([place]) 12/25/2016   Flu-like symptoms 12/25/2016   Dehydration with hyponatremia 12/25/2016   AKI (acute kidney injury) ([place]) 12/25/2016   CKD (chronic kidney disease) stage 3, GFR 30-59 ml/min ([place]) 12/25/2016   S/P repair of ventral hernia 12/25/2016   Hypothyroidism 12/25/2016   Essential hypertension 12/25/2016   Neoplasm of left kidney 04/18/2016   Postoperative examination 03/07/2016   Strangulated inguinal hernia 02/29/2016   Past Medical History:  Diagnosis Date   Anxiety    Arthritis    BPH (benign prostatic hyperplasia)    Cataracts, bilateral    Chronic back pain    Followed by Dr. Arnoldo Morale   CKD (chronic kidney disease) stage 3, GFR 30-59 ml/min () 09/2021   Creatinine 99991111   Complication of anesthesia    unable to void after surgery   Coronary artery disease 10/31/2021   Coronary Calcium Score 535   GERD (gastroesophageal reflux disease)    History of hiatal hernia    History of kidney surgery    left kidney removed due to cancer 2017   History of thyroid cancer 1998   Thyroidectomy-now on Synthroid   Hypertension    Hypothyroidism (acquired) 1998   Post thyroidectomy for thyroid cancer   Macular degeneration of both eyes  dry   Mixed dyslipidemia 09/27/2021   Low HDL, high triglycerides with normal LDL: TC 117, TG 135, HDL 25, LDL 56.   PONV (postoperative nausea and vomiting)    Renal cell carcinoma, left ([place]) 2017   Left nephrectomy-Dr. Sherilyn Banker, complicated by multiple different hernia surgeries.    Family History  Problem Relation Age of Onset   Dementia Mother    Coronary artery disease Father 60       Longstanding smoker   Heart attack Father 14       Second MI was at 43   Hypertension Father    Hyperlipidemia Father    Heart attack Brother 87       Sudden cardiac death from MI   Healthy Son      Past Surgical History:  Procedure Laterality Date   APPENDECTOMY     COLONOSCOPY     CYSTOSCOPY WITH RETROGRADE PYELOGRAM, URETEROSCOPY AND STENT PLACEMENT Left 04/01/2016   Procedure: CYSTOSCOPY WITH RETROGRADE PYELOGRAM, left ureter;  Surgeon: Raynelle Bring, MD;  [facility];  Service: Urology;  Laterality: Left;   HEMORRHOID SURGERY     HERNIA REPAIR     inguinal-3 on left , 1 on right   INCISIONAL HERNIA REPAIR N/A 12/23/2016   Procedure: LAPAROSCOPIC VERSUS OPEN LYSIS OF ADHESIONS AND INCISIONAL HERNIA REPAIR WITH MESH;  Surgeon: Ralene Ok, MD;  Location: [place] [facility];  Service: General;  Laterality: N/A;   [place]  10/08/2017   OPEN   INCISIONAL HERNIA REPAIR N/A 10/08/2017   Procedure: [place];  Surgeon: Ralene Ok, MD;  Location: [place];  Service: General;  Laterality: N/A;   INSERTION OF MESH N/A 12/23/2016   Procedure: INSERTION OF MESH;  Surgeon: Ralene Ok, MD;  Location: [place];  Service: General;  Laterality: N/A;   INSERTION OF MESH N/A 01/10/2017   Procedure: INSERTION OF MESH;  Surgeon: Ralene Ok, MD;  Location: [place];  Service: General;  Laterality: N/A;   INSERTION OF MESH N/A 10/08/2017   Procedure: INSERTION OF MESH;  Surgeon: Ralene Ok, MD;  Location: [place];  Service: General;  Laterality: N/A;   LAPAROSCOPIC NEPHRECTOMY Left 04/18/2016   Procedure: LAPAROSCOPIC RADICAL  LEFT NEPHRECTOMY;  Surgeon: Raynelle Bring, MD;  [facility];  Service: Urology;  Laterality: Left;   LAPAROSCOPY N/A 01/10/2017   Procedure: LAPAROSCOPY DIAGNOSTIC , POSSIBLE LAPAROTOMY;  Surgeon: Ralene Ok, MD;  Location: [place];  Service: General;  Laterality: N/A;   NASAL SINUS SURGERY     left side   NASAL SINUS SURGERY Left 10/25/2019   Procedure: ENDOSCOPIC SINUS SURGERY/ethmoid and maxillary;  Surgeon: Izora Gala, MD;  Location: [place];  Service: ENT;  Laterality: Left;    THYROIDECTOMY, PARTIAL  1998   left small amount on left side-for cancer   TONSILLECTOMY     TOTAL KNEE ARTHROPLASTY Right 11/19/2022   Procedure: RIGHT TOTAL KNEE ARTHROPLASTY;  Surgeon: Meredith Pel, MD;  Location: [facility];  Service: Orthopedics;  Laterality: Right;   Social History   Occupational History   Not on file  Tobacco Use   Smoking status: Never   Smokeless tobacco: Never  Vaping Use   Vaping Use: Never used  Substance and Sexual Activity   Alcohol use: No   Drug use: No   Sexual activity: Not on file

## 2023-01-02 DIAGNOSIS — M25461 Effusion, right knee: Secondary | ICD-10-CM | POA: Diagnosis not present

## 2023-01-02 DIAGNOSIS — M25561 Pain in right knee: Secondary | ICD-10-CM | POA: Diagnosis not present

## 2023-01-02 DIAGNOSIS — R2689 Other abnormalities of gait and mobility: Secondary | ICD-10-CM | POA: Diagnosis not present

## 2023-01-02 DIAGNOSIS — M25661 Stiffness of right knee, not elsewhere classified: Secondary | ICD-10-CM | POA: Diagnosis not present

## 2023-01-28 DIAGNOSIS — M5481 Occipital neuralgia: Secondary | ICD-10-CM | POA: Diagnosis not present

## 2023-02-17 ENCOUNTER — Other Ambulatory Visit: Payer: Self-pay | Admitting: Surgical

## 2023-02-17 ENCOUNTER — Telehealth: Payer: Self-pay | Admitting: *Deleted

## 2023-02-17 MED ORDER — AMOXICILLIN 500 MG PO TABS: 2000.0000 mg | ORAL_TABLET | Freq: Once | ORAL | 0 refills | Status: AC

## 2023-02-17 NOTE — Telephone Encounter (Signed)
Patient having dental cleaning next week and would like antibiotic called into his pharmacy. Thank you.

## 2023-02-17 NOTE — Telephone Encounter (Signed)
Patient called and updated medication has been sent.

## 2023-02-17 NOTE — Telephone Encounter (Signed)
Sent in

## 2023-02-23 DIAGNOSIS — J028 Acute pharyngitis due to other specified organisms: Secondary | ICD-10-CM | POA: Diagnosis not present

## 2023-02-23 DIAGNOSIS — U071 COVID-19: Secondary | ICD-10-CM | POA: Diagnosis not present

## 2023-02-26 ENCOUNTER — Ambulatory Visit: Payer: PPO | Admitting: Dermatology

## 2023-03-10 ENCOUNTER — Ambulatory Visit: Payer: PPO | Admitting: Family Medicine

## 2023-03-10 ENCOUNTER — Other Ambulatory Visit: Payer: Self-pay

## 2023-03-10 VITALS — BP 110/72 | HR 62 | Ht 75.0 in | Wt 206.0 lb

## 2023-03-10 DIAGNOSIS — M25551 Pain in right hip: Secondary | ICD-10-CM

## 2023-03-10 NOTE — Patient Instructions (Addendum)
Thank you for coming in today.   You received an injection today. Seek immediate medical attention if the joint becomes red, extremely painful, or is oozing fluid.   Please complete the exercises that the athletic trainer went over with you:  View at [email or web address]-exercise-[email or web address] using code: [email or web address]  Let me know if not better and I can refer you to physical therapy

## 2023-03-10 NOTE — Progress Notes (Signed)
   Rubin Payor, PhD, LAT, ATC acting as a scribe for Clementeen Graham, MD.  MORTIMER BAIR is a 77 y.o. male who presents to [facility] today for exacerbation of right hip pain.  Patient was last seen by Dr. Denyse Amass on 03/28/2020 and was given a right GT steroid injection and advised to continue HEP.  Today, patient reports he had a R TRK back in Jan and he thinks this may have exacerbated his R hip pain. Pt locates pain to the lateral aspect of his R hip and intermittently into the R groin.   Dx imaging: 01/25/2020 R hip x-ray  07/28/2017 L-spine CT  Pertinent review of systems: No fevers or chills  Relevant historical information: Right knee replacement occurring about 4 months ago (November 19, 2022)   Exam:  BP 110/72   Pulse 62   Ht 6\' 3"  (1.905 m)   Wt 206 lb (93.4 kg)   SpO2 97%   BMI 25.75 kg/m  General: Well Developed, well nourished, and in no acute distress.   MSK: Right hip: Normal-appearing Tender palpation greater trochanter.  Normal hip motion.  Hip abduction and external rotation strength are diminished.  Hip abduction 4 -/5.  External rotation 4/5.    Lab and Radiology Results  Procedure: Real-time Ultrasound Guided Injection of right hip greater trochanter bursa Device: Philips Affiniti 50G Images permanently stored and available for review in PACS Verbal informed consent obtained.  Discussed risks and benefits of procedure. Warned about infection, bleeding, hyperglycemia damage to structures among others. Patient expresses understanding and agreement Time-out conducted.   Noted no overlying erythema, induration, or other signs of local infection.   Skin prepped in a sterile fashion.   Local anesthesia: Topical Ethyl chloride.   With sterile technique and under real time ultrasound guidance: 40 mg of Kenalog and 2 mL of Marcaine injected into right lateral hip greater trochanter bursa. Fluid seen entering the bursa.   Completed  without difficulty   Pain immediately resolved suggesting accurate placement of the medication.   Advised to call if fevers/chills, erythema, induration, drainage, or persistent bleeding.   Images permanently stored and available for review in the ultrasound unit.  Impression: Technically successful ultrasound guided injection.         Assessment and Plan: 77 y.o. male with right lateral hip pain.  This is an acute recurrence of a chronic issue was seen in my clinic about 3 years ago.  I believe his increased activity following his right knee replacement is exacerbating his underlying issue.  He has significantly weak hip abductors which I think is the fundamental problem.  Plan for repeat steroid injections today and home exercise program working on hip abduction strength.  If this is insufficient formal physical therapy would be helpful.   PDMP not reviewed this encounter. Orders Placed This Encounter  Procedures   [place] LIMITED JOINT SPACE STRUCTURES LOW RIGHT(NO LINKED CHARGES)    Order Specific Question:   Reason for Exam (SYMPTOM  OR DIAGNOSIS REQUIRED)    Answer:   right hip pain    Order Specific Question:   Preferred imaging location?    Answer:   [facility]   No orders of the defined types were placed in this encounter.    Discussed warning signs or symptoms. Please see discharge instructions. Patient expresses understanding.   The above documentation has been reviewed and is accurate and complete Clementeen Graham, M.D.

## 2023-03-27 DIAGNOSIS — H25813 Combined forms of age-related cataract, bilateral: Secondary | ICD-10-CM | POA: Diagnosis not present

## 2023-04-04 ENCOUNTER — Telehealth: Payer: Self-pay | Admitting: Gastroenterology

## 2023-04-04 NOTE — Telephone Encounter (Signed)
Good morning Dr. Myrtie Neither,   We received a referral for this patient to schedule a colonoscopy. Patient last had colonoscopy in 05/2021 with Dr. Kinnie Scales at [facility]. Since then Dr. Kinnie Scales has retired and patient would like to establish GI care with you due to being recommended. Records from previous colonoscopies were obtained and scanned into Media for your review. Would you please advise on scheduling?  Thank you.

## 2023-04-08 NOTE — Telephone Encounter (Signed)
Given age and medical conditions, please arrange a new patient clinic visit.  Records will be reviewed in detail at that time and decision made regarding need for colonoscopy.  HD

## 2023-04-15 DIAGNOSIS — N644 Mastodynia: Secondary | ICD-10-CM | POA: Diagnosis not present

## 2023-04-16 DIAGNOSIS — R351 Nocturia: Secondary | ICD-10-CM | POA: Diagnosis not present

## 2023-04-16 DIAGNOSIS — N210 Calculus in bladder: Secondary | ICD-10-CM | POA: Diagnosis not present

## 2023-04-16 DIAGNOSIS — Z85528 Personal history of other malignant neoplasm of kidney: Secondary | ICD-10-CM | POA: Diagnosis not present

## 2023-04-16 DIAGNOSIS — N281 Cyst of kidney, acquired: Secondary | ICD-10-CM | POA: Diagnosis not present

## 2023-04-16 DIAGNOSIS — N401 Enlarged prostate with lower urinary tract symptoms: Secondary | ICD-10-CM | POA: Diagnosis not present

## 2023-04-17 ENCOUNTER — Other Ambulatory Visit: Payer: Self-pay

## 2023-04-17 ENCOUNTER — Encounter: Payer: Self-pay | Admitting: Gastroenterology

## 2023-04-17 DIAGNOSIS — N644 Mastodynia: Secondary | ICD-10-CM

## 2023-04-29 ENCOUNTER — Ambulatory Visit
Admission: RE | Admit: 2023-04-29 | Discharge: 2023-04-29 | Disposition: A | Discharge status: Home / Self Care | Payer: PPO | Source: Ambulatory Visit

## 2023-04-29 ENCOUNTER — Ambulatory Visit: Payer: PPO

## 2023-04-29 DIAGNOSIS — N644 Mastodynia: Secondary | ICD-10-CM

## 2023-04-29 DIAGNOSIS — N62 Hypertrophy of breast: Secondary | ICD-10-CM | POA: Diagnosis not present

## 2023-06-12 DIAGNOSIS — J069 Acute upper respiratory infection, unspecified: Secondary | ICD-10-CM | POA: Diagnosis not present

## 2023-06-12 DIAGNOSIS — J028 Acute pharyngitis due to other specified organisms: Secondary | ICD-10-CM | POA: Diagnosis not present

## 2023-06-12 DIAGNOSIS — Z20822 Contact with and (suspected) exposure to covid-19: Secondary | ICD-10-CM | POA: Diagnosis not present

## 2023-06-12 DIAGNOSIS — R110 Nausea: Secondary | ICD-10-CM | POA: Diagnosis not present

## 2023-06-12 DIAGNOSIS — R6889 Other general symptoms and signs: Secondary | ICD-10-CM | POA: Diagnosis not present

## 2023-06-27 ENCOUNTER — Ambulatory Visit: Payer: PPO | Attending: Cardiology | Admitting: Cardiology

## 2023-06-27 ENCOUNTER — Encounter: Payer: Self-pay | Admitting: Cardiology

## 2023-06-27 VITALS — BP 112/78 | HR 57 | Ht 75.0 in | Wt 199.2 lb

## 2023-06-27 DIAGNOSIS — I272 Pulmonary hypertension, unspecified: Secondary | ICD-10-CM

## 2023-06-27 DIAGNOSIS — E785 Hyperlipidemia, unspecified: Secondary | ICD-10-CM

## 2023-06-27 DIAGNOSIS — R931 Abnormal findings on diagnostic imaging of heart and coronary circulation: Secondary | ICD-10-CM

## 2023-06-27 DIAGNOSIS — I10 Essential (primary) hypertension: Secondary | ICD-10-CM

## 2023-06-27 DIAGNOSIS — I2510 Atherosclerotic heart disease of native coronary artery without angina pectoris: Secondary | ICD-10-CM

## 2023-06-27 DIAGNOSIS — Z8249 Family history of ischemic heart disease and other diseases of the circulatory system: Secondary | ICD-10-CM | POA: Diagnosis not present

## 2023-06-27 NOTE — Patient Instructions (Signed)
Medication Instructions:  Your physician recommends that you continue on your current medications as directed. Please refer to the Current Medication list given to you today.  *If you need a refill on your cardiac medications before your next appointment, please call your pharmacy*     Follow-Up: At [facility], you and your health needs are our priority.  As part of our continuing mission to provide you with exceptional heart care, we have created designated Provider Care Teams.  These Care Teams include your primary Cardiologist (physician) and Advanced Practice Providers (APPs -  Physician Assistants and Nurse Practitioners) who all work together to provide you with the care you need, when you need it.  We recommend signing up for the patient portal called "MyChart".  Sign up information is provided on this After Visit Summary.  MyChart is used to connect with patients for Virtual Visits (Telemedicine).  Patients are able to view lab/test results, encounter notes, upcoming appointments, etc.  Non-urgent messages can be sent to your provider as well.   To learn more about what you can do with MyChart, go to [email or web address].    Your next appointment:   9 month(s) - call in February to schedule next appointment   Provider:   Bryan Lemma, MD

## 2023-06-27 NOTE — Progress Notes (Unsigned)
Cardiology Office Note:  .   Date:  06/30/2023  ID:  Eric Suarez, DOB Jul 30, 1946, MRN [id number] PCP: [name], MD  [facility] Providers Cardiologist:  Bryan Lemma, MD     Chief Complaint  Patient presents with   Follow-up    Doing well.  Due for labs in November.  No active symptoms.   Coronary Artery Disease    No active anginal symptoms.  Stays active.    History of Present Illness: .     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  He presents here for essentially delayed annual follow-up at the request of [name], MD.  Eric Suarez was last seen on March 04, 2022 with no complaints of any chest pain or pressure.  Mostly limited by knee pain.  Following 3 yards in the day without any exertional suppression.  No significant dyspnea.  Most limited by the knee pain.  No palpitations.  No claudication.  Feet get cold easily.  Also noted back pain.  Discussed importance of better lipid control.  He was seen virtually by [name], NP for preop assessment in Jan 2024. For R Knee Replacement. Able to achieve 4.3 METS    Subjective   INTERVAL HISTORY Seen in Urgent Care 06/12/2023 with flulike illness.  Eric Suarez presents here today - notes that the knee surgery did well.  Able to use knee much better - thinks he may need L knee.  SEveral falls since 2/2 not picking up feet or getting tripped up. 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  Can do ~3 min + weed eating -- limited by back pain > SOB.    ROS:  Cardiovascular ROS: no chest pain or dyspnea on exertion negative for - edema, irregular heartbeat, loss of consciousness, orthopnea, palpitations, paroxysmal nocturnal dyspnea, rapid heart  rate, shortness of breath, or synceop/near syncope/ TIA/amaurosis fugax, CVA, claudication Review of Systems - Negative except L knee & back pain - limits activity - but still >> 4 METS If not > 8 METS     Objective   Studies Reviewed: Marland Kitchen       Coronary Calcium Score 10/31/2021: Total score 535, Left Main.  306, LAD 120, LCx 29, RCA 0 Coronary CTA 12/26/2021: Calcium score 536.  Normal coronary arteries origin with mild nonobstructive CAD (25 to 49%) Left Main and D1 stenosis with minimal (0-24%) plaque in the LAD and LCx.  No notable disease in the small & non-dominant RCA. 2D Echo 01/14/2022: EF 65 to 70%.  No RWMA.  Indeterminate diastolic pressure.  Mild Ascending Aortic dilation-39 mm.  No results found for: "CHOL", "HDL", "LDLCALC", "LDLDIRECT", "TRIG", "CHOLHDL" -> Due for labs to be checked by PCP in roughly November timeframe.  Target LDL should be less than 70.  Risk Assessment/Calculations:             Physical Exam:   VS:  BP 112/78   Pulse (!) 57   Ht 6\' 3"  (1.905 m)   Wt 199 lb 3.2 oz (90.4 kg)   SpO2 98%   BMI 24.90 kg/m    Wt Readings from Last 3 Encounters:  06/27/23 199 lb 3.2 oz (90.4 kg)  03/10/23 206 lb (93.4 kg)  11/19/22 195 lb (88.5 kg)    GEN: Well nourished, well developed in no acute distress; healthy appearing for age.  Well-groomed. NECK: No JVD; No carotid bruits CARDIAC: Normal S1, S2; RRR, no murmurs, rubs, gallops RESPIRATORY:  Clear to auscultation without rales, wheezing or rhonchi ; nonlabored, good air movement. ABDOMEN: Soft, non-tender, non-distended EXTREMITIES:  No edema; No deformity     ASSESSMENT AND PLAN: .    Problem List Items Addressed This Visit       Cardiology Problems   Left main coronary artery disease (Chronic)    Coronary calcification with mild plaque noted in the LEFT MAIN with no active symptoms.  Target LDL should be less than 70 based simply on presence of left main plaque and elevated Coronary Calcium Score over  500.  Thankfully he is asymptomatic with no chest pain or dyspnea.  Only notes some dyspnea when he overdoes it but is very active and able to do yard work for at least 30 minutes at a time.  Plan:  LDL goal should be less than 70.  Last visible labs are from November 2022.He is due for labs rechecked by PCP in November.  Currently on Zetia 10 mg daily Has been intolerant of statins, may need to consider Nexletol versus Vascepa depending on current labs.   BP well-controlled on atenolol-chlorthalidone 50-25 mg taking 1/2 tablet daily.      Hyperlipidemia LDL goal <70 (Chronic)    As noted above, due for labs to be checked in November.  Currently on Zetia.  Depending on follow-up lab results may want to address FFR for triglycerides and potentially Nexletol for additional LDL management.  Target LDL less than 70.  Defer management to PCP.      Essential hypertension (Chronic)    BP well-controlled on current dose of atenolol-chlorthalidone-dose was reduced to 1/2 tablet so now taking one half of the combo pill of 50-25 mg (dose is 25-12.5 mg)      [place] coronary artery calcium score greater than 400 - Primary (Chronic)    Coronary CTA revealed no obstructive disease but presence of left main disease.  Aggressive lipid and blood pressure as well as glycemic control recommended.  BP well-controlled, lipids have been borderline.  Labs followed by PCP.      Relevant Orders   EKG 12-Lead (Completed)     Other   Family history of premature coronary artery disease: Father & Brother (Chronic)    At his current age, he now has become family history as opposed to being affected by personal family history.  He has existing disease but nonobstructive.  Continue to monitor for symptoms.           Dispo: Return in about 9 months (around 03/26/2024) for Routine follow up with me.  Total time spent: 25 min spent with patient + 13 min spent charting = 38 min     Signed, [name], MD, MS Bryan Lemma, M.D., M.S. Interventional Cardiologist  [facility]  Pager # [phone] Phone # [phone] [address] [place], [place] [address]

## 2023-06-30 ENCOUNTER — Encounter: Payer: Self-pay | Admitting: Cardiology

## 2023-06-30 NOTE — Assessment & Plan Note (Signed)
Coronary calcification with mild plaque noted in the LEFT MAIN with no active symptoms.  Target LDL should be less than 70 based simply on presence of left main plaque and elevated Coronary Calcium Score over 500.  Thankfully he is asymptomatic with no chest pain or dyspnea.  Only notes some dyspnea when he overdoes it but is very active and able to do yard work for at least 30 minutes at a time.  Plan:  LDL goal should be less than 70.  Last visible labs are from November 2022.He is due for labs rechecked by PCP in November.  Currently on Zetia 10 mg daily Has been intolerant of statins, may need to consider Nexletol versus Vascepa depending on current labs.   BP well-controlled on atenolol-chlorthalidone 50-25 mg taking 1/2 tablet daily.

## 2023-06-30 NOTE — Assessment & Plan Note (Signed)
BP well-controlled on current dose of atenolol-chlorthalidone-dose was reduced to 1/2 tablet so now taking one half of the combo pill of 50-25 mg (dose is 25-12.5 mg)

## 2023-06-30 NOTE — Assessment & Plan Note (Signed)
At his current age, he now has become family history as opposed to being affected by personal family history.  He has existing disease but nonobstructive.  Continue to monitor for symptoms.

## 2023-06-30 NOTE — Assessment & Plan Note (Addendum)
As noted above, due for labs to be checked in November.  Currently on Zetia.  Depending on follow-up lab results may want to address FFR for triglycerides and potentially Nexletol for additional LDL management.  Target LDL less than 70.  Defer management to PCP.

## 2023-06-30 NOTE — Assessment & Plan Note (Signed)
Coronary CTA revealed no obstructive disease but presence of left main disease.  Aggressive lipid and blood pressure as well as glycemic control recommended.  BP well-controlled, lipids have been borderline.  Labs followed by PCP.

## 2023-07-02 DIAGNOSIS — M5481 Occipital neuralgia: Secondary | ICD-10-CM | POA: Diagnosis not present

## 2023-07-23 ENCOUNTER — Encounter: Payer: Self-pay | Admitting: Gastroenterology

## 2023-07-23 ENCOUNTER — Ambulatory Visit: Payer: PPO | Admitting: Gastroenterology

## 2023-07-23 VITALS — BP 118/80 | HR 68 | Ht 75.0 in | Wt 201.0 lb

## 2023-07-23 DIAGNOSIS — D124 Benign neoplasm of descending colon: Secondary | ICD-10-CM | POA: Diagnosis not present

## 2023-07-23 DIAGNOSIS — Z86010 Personal history of colonic polyps: Secondary | ICD-10-CM

## 2023-07-23 DIAGNOSIS — D123 Benign neoplasm of transverse colon: Secondary | ICD-10-CM

## 2023-07-23 MED ORDER — NA SULFATE-K SULFATE-MG SULF 17.5-3.13-1.6 GM/177ML PO SOLN: 1.0000 | Freq: Once | ORAL | 0 refills | Status: AC

## 2023-07-23 NOTE — Patient Instructions (Addendum)
_______________________________________________________  If your blood pressure at your visit was 140/90 or greater, please contact your primary care physician to follow up on this.  _______________________________________________________  If you are age 78 or older, your body mass index should be between 23-30. Your Body mass index is 25.12 kg/m. If this is out of the aforementioned range listed, please consider follow up with your Primary Care Provider.  If you are age 41 or younger, your body mass index should be between 19-25. Your Body mass index is 25.12 kg/m. If this is out of the aformentioned range listed, please consider follow up with your Primary Care Provider.   ________________________________________________________  The [facility] providers would like to encourage you to use [facility] to communicate with providers for non-urgent requests or questions.  Due to long hold times on the telephone, sending your provider a message by [facility] may be a faster and more efficient way to get a response.  Please allow 48 business hours for a response.  Please remember that this is for non-urgent requests.  _______________________________________________________  Eric Suarez have been scheduled for a colonoscopy. Please follow written instructions given to you at your visit today.   Please pick up your prep supplies at the pharmacy within the next 1-3 days.  If you use inhalers (even only as needed), please bring them with you on the day of your procedure.  DO NOT TAKE 7 DAYS PRIOR TO TEST- Trulicity (dulaglutide) Ozempic, Wegovy (semaglutide) Mounjaro (tirzepatide) Bydureon Bcise (exanatide extended release)  DO NOT TAKE 1 DAY PRIOR TO YOUR TEST Rybelsus (semaglutide) Adlyxin (lixisenatide) Victoza (liraglutide) Byetta (exanatide) ___________________________________________________________________________  Due to recent changes in healthcare laws, you may see the results of your imaging  and laboratory studies on MyChart before your provider has had a chance to review them.  We understand that in some cases there may be results that are confusing or concerning to you. Not all laboratory results come back in the same time frame and the provider may be waiting for multiple results in order to interpret others.  Please give [place] 48 hours in order for your provider to thoroughly review all the results before contacting the office for clarification of your results.   It was a pleasure to see you today!  Thank you for trusting me with your gastrointestinal care!

## 2023-07-23 NOTE — Progress Notes (Signed)
[facility] Consult Note:  History: Eric Suarez 07/23/2023  Referring provider: [name], MD  Reason for consult/chief complaint: Colonoscopy (Pt states he would like to discuss a colon.)   Subjective  HPI: Eric Suarez contacted our office several months ago requesting a transfer of care after the retirement of his previous GI physician, Dr. Kinnie Scales.  Eric Suarez was here with his wife today, and he reports feeling well from a digestive standpoint.  He has been on daily PPI for a few years to help control reflux symptoms, and he is happy with how that works.  He previously had dysphagia and recalls having undergone an EGD with a dilation on a couple of occasions, the most recent one several years ago apparently relieved his dysphagia with no recurrence of that since. His bowel habits are regular without rectal bleeding and he denies chronic abdominal pain.  His appetite is good and weight stable.  Scanned records from Dr. Kinnie Scales include the following:  Colonoscopy report January 2020 -2 cm descending colon polyp removed hot snare, 1 cm descending colon polyp removed hot snare, 8 mm sigmoid polyp removed, 8 mm hepatic flexure polyp removed but not retrieved           no pathology reports for these polyps included with records  Colonoscopy report July 2022 -ascending colon polyp x 4, 5 to 10 mm,    hepatic flexure polyp x 3, 8 mm each,   descending colon polyp x 3, 7 to 9 mm,   sigmoid: Polyp x 1, 10 mm                pathology report states all of those polyps were tubular adenomas without mention of high-grade dysplasia.   Of note, Eric Suarez was seen by the cardiology clinic 11/15/2022 for his history of nonobstructive coronary disease and preoperative assessment prior to a planned orthopedic surgery.  Assessment was that he was at low risk for perioperative complications, no further presurgical cardiac testing was performed, and he was available to hold aspirin several days  prior. ROS:  Review of Systems  Constitutional:  Positive for fatigue. Negative for appetite change and unexpected weight change.  HENT:  Negative for mouth sores and voice change.   Eyes:  Negative for pain and redness.  Respiratory:  Negative for cough and shortness of breath.   Cardiovascular:  Negative for chest pain and palpitations.  Genitourinary:  Negative for dysuria and hematuria.  Musculoskeletal:  Positive for arthralgias. Negative for myalgias.  Skin:  Negative for pallor and rash.  Neurological:  Negative for weakness and headaches.  Hematological:  Negative for adenopathy.     Past Medical History: Past Medical History:  Diagnosis Date   Anxiety    Arthritis    BPH (benign prostatic hyperplasia)    Cataracts, bilateral    Chronic back pain    Followed by Dr. Lovell Sheehan   CKD (chronic kidney disease) stage 3, GFR 30-59 ml/min (HCC) 09/2021   Creatinine 1.47   Complication of anesthesia    unable to void after surgery   Coronary artery disease 10/31/2021   Coronary Calcium Score 535   GERD (gastroesophageal reflux disease)    History of hiatal hernia    History of kidney surgery    left kidney removed due to cancer 2017   History of thyroid cancer 1998   Thyroidectomy-now on Synthroid   Hyperlipidemia    Hypertension    Hypothyroidism (acquired) 1998   Post thyroidectomy for thyroid cancer  Macular degeneration of both eyes    dry   Mixed dyslipidemia 09/27/2021   Low HDL, high triglycerides with normal LDL: TC 117, TG 135, HDL 25, LDL 56.   Nocturia    Occipital neuralgia    PONV (postoperative nausea and vomiting)    Renal cell carcinoma, left (HCC) 2017   Left nephrectomy-Dr. Billey Chang, complicated by multiple different hernia surgeries.   Renal cyst      Past Surgical History: Past Surgical History:  Procedure Laterality Date   APPENDECTOMY     COLONOSCOPY     CYSTOSCOPY WITH RETROGRADE PYELOGRAM, URETEROSCOPY AND STENT PLACEMENT Left  04/01/2016   Procedure: CYSTOSCOPY WITH RETROGRADE PYELOGRAM, left ureter;  Surgeon: Heloise Purpura, MD;  Location: [facility];  Service: Urology;  Laterality: Left;   HEMORRHOID SURGERY     HERNIA REPAIR     inguinal-3 on left , 1 on right   INCISIONAL HERNIA REPAIR N/A 12/23/2016   Procedure: LAPAROSCOPIC VERSUS OPEN LYSIS OF ADHESIONS AND INCISIONAL HERNIA REPAIR WITH MESH;  Surgeon: Axel Filler, MD;  Location: [facility];  Service: General;  Laterality: N/A;   INCISIONAL HERNIA REPAIR  10/08/2017   OPEN   INCISIONAL HERNIA REPAIR N/A 10/08/2017   Procedure: OPEN HERNIA REPAIR INCISIONAL ERAS PATHWAY;  Surgeon: Axel Filler, MD;  Location: [facility] OR;  Service: General;  Laterality: N/A;   INSERTION OF MESH N/A 12/23/2016   Procedure: INSERTION OF MESH;  Surgeon: Axel Filler, MD;  Location: [facility] OR;  Service: General;  Laterality: N/A;   INSERTION OF MESH N/A 01/10/2017   Procedure: INSERTION OF MESH;  Surgeon: Axel Filler, MD;  Location: [facility];  Service: General;  Laterality: N/A;   INSERTION OF MESH N/A 10/08/2017   Procedure: INSERTION OF MESH;  Surgeon: Axel Filler, MD;  Location: [facility] OR;  Service: General;  Laterality: N/A;   LAPAROSCOPIC NEPHRECTOMY Left 04/18/2016   Procedure: LAPAROSCOPIC RADICAL  LEFT NEPHRECTOMY;  Surgeon: Heloise Purpura, MD;  Location: [facility];  Service: Urology;  Laterality: Left;   LAPAROSCOPY N/A 01/10/2017   Procedure: LAPAROSCOPY DIAGNOSTIC , POSSIBLE LAPAROTOMY;  Surgeon: Axel Filler, MD;  Location: [facility];  Service: General;  Laterality: N/A;   NASAL SINUS SURGERY     left side   NASAL SINUS SURGERY Left 10/25/2019   Procedure: ENDOSCOPIC SINUS SURGERY/ethmoid and maxillary;  Surgeon: Serena Colonel, MD;  Location: [facility];  Service: ENT;  Laterality: Left;   THYROIDECTOMY, PARTIAL  1998   left small amount on left side-for cancer   TONSILLECTOMY     TOTAL KNEE ARTHROPLASTY Right 11/19/2022   Procedure: RIGHT TOTAL KNEE  ARTHROPLASTY;  Surgeon: Cammy Copa, MD;  Location: [facility];  Service: Orthopedics;  Laterality: Right;     Family History: Family History  Problem Relation Age of Onset   Dementia Mother    Coronary artery disease Father 53       Longstanding smoker   Heart attack Father 42       Second MI was at 54   Hypertension Father    Hyperlipidemia Father    Heart attack Brother 41       Sudden cardiac death from MI   Healthy Son    Liver disease Neg Hx    Esophageal cancer Neg Hx    Colon cancer Neg Hx     Social History: Social History   Socioeconomic History   Marital status: Married    Spouse name: Not on file   Number of children:  1   Years of education: Not on file   Highest education level: Not on file  Occupational History   Occupation: retired  Tobacco Use   Smoking status: Never   Smokeless tobacco: Never  Vaping Use   Vaping status: Never Used  Substance and Sexual Activity   Alcohol use: No   Drug use: No   Sexual activity: Not on file  Other Topics Concern   Not on file  Social History Narrative   Married father 1 son who lives in [place].  He lives with his wife.      Drink 2 cups of coffee a day.      Previously used to exercise quite a bit with significant daily walking and mowing multiple lawns.  (His house, along with several neighbors and the church lawn) now he does still do some yard work and occasional walking.  But since the onset of COVID, has slowly declined in and out of exercise.   Social Determinants of Health   Financial Resource Strain: Not on file  Food Insecurity: Not on file  Transportation Needs: Not on file  Physical Activity: Not on file  Stress: Not on file  Social Connections: Not on file    Allergies: Allergies  Allergen Reactions   Tamsulosin Nausea And Vomiting and Other (See Comments)    Uncomfortable in pelvic region   Fish Oil Hives   Rosuvastatin Calcium Other (See Comments)   Sulfa Antibiotics Rash     Outpatient Meds: Current Outpatient Medications  Medication Sig Dispense Refill   acetaminophen (TYLENOL) 325 MG tablet Take 1-2 tablets (325-650 mg total) by mouth every 6 (six) hours as needed for mild pain (pain score 1-3 or temp > 100.5). 40 tablet 0   alfuzosin (UROXATRAL) 10 MG 24 hr tablet Take 10 mg daily by mouth.   11   amoxicillin (AMOXIL) 500 MG tablet Take 500 mg by mouth as needed.     Ascorbic Acid (VITAMIN C) 500 MG CHEW Chew 500 mg by mouth daily.     aspirin 81 MG chewable tablet Chew 1 tablet (81 mg total) by mouth 2 (two) times daily. 42 tablet 0   atenolol-chlorthalidone (TENORETIC) 50-25 MG tablet Take 1 tablet by mouth daily.     Cholecalciferol (VITAMIN D3) 25 MCG (1000 UT) CAPS Take 1,000 Units by mouth daily.     dutasteride (AVODART) 0.5 MG capsule Take 0.5 mg daily by mouth.     ezetimibe (ZETIA) 10 MG tablet Take 10 mg by mouth daily.     gabapentin (NEURONTIN) 300 MG capsule Take 400 mg by mouth 2 (two) times daily.     levothyroxine (SYNTHROID) 125 MCG tablet Take 125 mcg by mouth daily before breakfast.     Multiple Vitamins-Minerals (EYE VITAMINS PO) Take 1 tablet by mouth daily.     Na Sulfate-K Sulfate-Mg Sulf 17.5-3.13-1.6 GM/177ML SOLN Take 1 kit by mouth once for 1 dose. 354 mL 0   nitroGLYCERIN (NITROSTAT) 0.4 MG SL tablet Place 0.4 mg under the tongue every 5 (five) minutes as needed for chest pain.     omeprazole (PRILOSEC) 20 MG capsule Take 20 mg by mouth daily.     OVER THE COUNTER MEDICATION Take 2 tablets daily by mouth. Cognium - Brain Vitamin     PARoxetine (PAXIL-CR) 25 MG 24 hr tablet Take 25 mg by mouth daily.  3   potassium chloride SA (K-DUR,KLOR-CON) 20 MEQ tablet Take 20 mEq by mouth every other day.  Zinc 100 MG TABS Take 1 tablet by mouth daily.     methocarbamol (ROBAXIN) 500 MG tablet Take 1 tablet (500 mg total) by mouth every 6 (six) hours as needed for muscle spasms. (Patient not taking: Reported on 06/27/2023) 30 tablet 0    No current facility-administered medications for this visit.      ___________________________________________________________________ Objective   Exam:  BP 118/80   Pulse 68   Ht 6\' 3"  (1.905 m)   Wt 201 lb (91.2 kg)   BMI 25.12 kg/m  Wt Readings from Last 3 Encounters:  07/23/23 201 lb (91.2 kg)  06/27/23 199 lb 3.2 oz (90.4 kg)  03/10/23 206 lb (93.4 kg)    General: Well-appearing Eyes: sclera anicteric, no redness ENT: oral mucosa moist without lesions, no cervical or supraclavicular lymphadenopathy CV: Regular without appreciable murmur, no JVD, no peripheral edema Resp: clear to auscultation bilaterally, normal RR and effort noted GI: soft, no tenderness, with active bowel sounds. No guarding or palpable organomegaly noted. Skin; warm and dry, no rash or jaundice noted Neuro: awake, alert and oriented x 3. Normal gross motor function and fluent speech  Data Prior endoscopic records as noted above Assessment: Encounter Diagnosis  Name Primary?   Hx of colonic polyps Yes    It is concerning that he had so many adenomas develop in the 2-1/2-year span between his last colonoscopies.  He certainly needs a surveillance colonoscopy, and was agreeable after discussion of procedure and risks.  The benefits and risks of the planned procedure were described in detail with the patient or (when appropriate) their health care proxy.  Risks were outlined as including, but not limited to, bleeding, infection, perforation, adverse medication reaction leading to cardiac or pulmonary decompensation, pancreatitis (if ERCP).  The limitation of incomplete mucosal visualization was also discussed.  No guarantees or warranties were given.    Thank you for the courtesy of this consult.  Please call me with any questions or concerns.  Charlie Pitter III  CC: Referring provider noted above

## 2023-07-29 ENCOUNTER — Encounter: Payer: Self-pay | Admitting: Gastroenterology

## 2023-08-12 ENCOUNTER — Encounter: Payer: Self-pay | Admitting: Gastroenterology

## 2023-08-12 ENCOUNTER — Ambulatory Visit: Payer: PPO | Admitting: Gastroenterology

## 2023-08-12 VITALS — BP 114/68 | HR 53 | Temp 97.8°F | Resp 16 | Ht 75.0 in | Wt 201.0 lb

## 2023-08-12 DIAGNOSIS — D123 Benign neoplasm of transverse colon: Secondary | ICD-10-CM | POA: Diagnosis not present

## 2023-08-12 DIAGNOSIS — Z860100 Personal history of colon polyps, unspecified: Secondary | ICD-10-CM | POA: Diagnosis not present

## 2023-08-12 DIAGNOSIS — Z860101 Personal history of adenomatous and serrated colon polyps: Secondary | ICD-10-CM | POA: Diagnosis not present

## 2023-08-12 DIAGNOSIS — Z09 Encounter for follow-up examination after completed treatment for conditions other than malignant neoplasm: Secondary | ICD-10-CM | POA: Diagnosis not present

## 2023-08-12 DIAGNOSIS — I2510 Atherosclerotic heart disease of native coronary artery without angina pectoris: Secondary | ICD-10-CM | POA: Diagnosis not present

## 2023-08-12 DIAGNOSIS — D122 Benign neoplasm of ascending colon: Secondary | ICD-10-CM

## 2023-08-12 DIAGNOSIS — K5140 Inflammatory polyps of colon without complications: Secondary | ICD-10-CM | POA: Diagnosis not present

## 2023-08-12 MED ORDER — SODIUM CHLORIDE 0.9 % IV SOLN: 500.0000 mL | Freq: Once | INTRAVENOUS | Status: DC

## 2023-08-12 NOTE — Op Note (Signed)
[facility] Patient Name: Eric Suarez Procedure Date: 08/12/2023 2:42 PM MRN: [id number] Endoscopist: Sherilyn Cooter L. Myrtie Neither , MD, [id number] Age: 77 Referring MD:  Date of Birth: 29-Nov-1945 Gender: Male Account #: [id number] Procedure:                Colonoscopy Indications:              High risk colon cancer surveillance: Personal                            history of colonic polyps                           Clinical details in most recent office note.                           Previous colonoscopies by Dr. Eilene Ghazi                            2022-ascending colon polyps x 4 from 5 to 10 mm,                            hepatic flexure polyp x 3, 8 mm each, descending                            colon polyps x 3, 7 to 9 mm. 10 mm sigmoid polyp                            (pathology report indicating all polyps were                            tubular adenomas without mention of HGD)                           January 20 20-2 cm descending colon polyp removed                            hot snare, 1 cm descending colon polyp hot snare, 8                            mm sigmoid polyp, 8 mm hepatic flexure polyp (no                            pathology reports for these polyps were available) Medicines:                Monitored Anesthesia Care Procedure:                Pre-Anesthesia Assessment:                           - Prior to the procedure, a History and Physical                            was performed, and patient medications and  allergies were reviewed. The patient's tolerance of                            previous anesthesia was also reviewed. The risks                            and benefits of the procedure and the sedation                            options and risks were discussed with the patient.                            All questions were answered, and informed consent                            was obtained. Prior Anticoagulants: The  patient has                            taken no anticoagulant or antiplatelet agents. ASA                            Grade Assessment: II - A patient with mild systemic                            disease. After reviewing the risks and benefits,                            the patient was deemed in satisfactory condition to                            undergo the procedure.                           After obtaining informed consent, the colonoscope                            was passed under direct vision. Throughout the                            procedure, the patient's blood pressure, pulse, and                            oxygen saturations were monitored continuously. The                            Olympus CF-HQ190L [id number]) Colonoscope was                            introduced through the anus and advanced to the the                            cecum, identified by appendiceal orifice and  ileocecal valve. The colonoscopy was somewhat                            difficult due to a redundant colon. Successful                            completion of the procedure was aided by using                            manual pressure and straightening and shortening                            the scope to obtain bowel loop reduction. The                            patient tolerated the procedure well. The quality                            of the bowel preparation was good with lavage of                            some residual prep debris. The ileocecal valve,                            appendiceal orifice, and rectum were photographed. Scope In: 2:49:05 PM Scope Out: 3:12:52 PM Scope Withdrawal Time: 0 hours 20 minutes 37 seconds  Total Procedure Duration: 0 hours 23 minutes 47 seconds  Findings:                 The perianal and digital rectal examinations were                            normal.                           Repeat examination of right colon under NBI                             performed.                           A tattoo was seen in the mid transverse colon. A                            post-polypectomy scar was found at the tattoo site.                           Three sessile polyps were found in the ascending                            colon. The polyps were 5 to 10 mm in size. These                            polyps were removed with a cold snare. Resection  and retrieval were complete. (Jar 1)                           Two pedunculated polyps were found in the                            transverse colon. The polyps were 12 mm in size.                            These polyps were removed with a hot snare.                            Resection and retrieval were complete. (Jar 2)                            -both of these polyps were adjacent to the fairly                            large tattoo noted above. (See photos) both polyps                            were removed en bloc with hot snare, then cold cut                            to smaller pieces for retrieval.                           Multiple diverticula were found in the left colon.                           There were mucosal changes consistent with prior                            surgical hemorrhoidectomy at the anal verge.                           The exam was otherwise without abnormality on                            direct and retroflexion views. Complications:            No immediate complications. Estimated Blood Loss:     Estimated blood loss was minimal. Impression:               - A tattoo was seen in the mid transverse colon. A                            post-polypectomy scar was found at the tattoo site.                           - Three 5 to 10 mm polyps in the ascending colon,  removed with a cold snare. Resected and retrieved.                           - Two 12 mm polyps in the transverse colon, removed                             with a hot snare. Resected and retrieved.                           - Diverticulosis in the left colon.                           - The examination was otherwise normal on direct                            and retroflexion views. Recommendation:           - Patient has a contact number available for                            emergencies. The signs and symptoms of potential                            delayed complications were discussed with the                            patient. Return to normal activities tomorrow.                            Written discharge instructions were provided to the                            patient.                           - Resume previous diet.                           - Continue present medications.                           - Await pathology results.                           - Repeat colonoscopy is recommended for                            surveillance. The colonoscopy date will be                            determined after pathology results from today's                            exam become available for review. Temima Kutsch L. Myrtie Neither, MD 08/12/2023 3:21:32 PM This report has been signed electronically.

## 2023-08-12 NOTE — Progress Notes (Signed)
Called to room to assist during endoscopic procedure.  Patient ID and intended procedure confirmed with present staff. Received instructions for my participation in the procedure from the performing physician.  

## 2023-08-12 NOTE — Progress Notes (Signed)
No changes to clinical history since GI office visit on 07/23/23. No significant changes were identified.  The patient continues to be an appropriate candidate for the planned procedure and anesthesia.   The patient is appropriate for an endoscopic procedure in the ambulatory setting.  - Amada Jupiter, MD

## 2023-08-12 NOTE — Patient Instructions (Addendum)

## 2023-08-12 NOTE — Progress Notes (Signed)
Vitals-SH  Pt's states no medical or surgical changes since previsit or office visit. 

## 2023-08-12 NOTE — Progress Notes (Signed)
Sedate, gd SR, tolerated procedure well, VSS, report to RN 

## 2023-08-13 ENCOUNTER — Observation Stay (HOSPITAL_COMMUNITY): Payer: PPO

## 2023-08-13 ENCOUNTER — Encounter (HOSPITAL_COMMUNITY): Payer: Self-pay

## 2023-08-13 ENCOUNTER — Observation Stay (HOSPITAL_COMMUNITY)
Admission: EM | Admit: 2023-08-13 | Discharge: 2023-08-14 | Disposition: A | Discharge status: Home / Self Care | Payer: PPO | Attending: Internal Medicine | Admitting: Internal Medicine

## 2023-08-13 ENCOUNTER — Other Ambulatory Visit: Payer: Self-pay

## 2023-08-13 ENCOUNTER — Telehealth: Payer: Self-pay | Admitting: *Deleted

## 2023-08-13 DIAGNOSIS — N1832 Chronic kidney disease, stage 3b: Secondary | ICD-10-CM | POA: Diagnosis not present

## 2023-08-13 DIAGNOSIS — Z7982 Long term (current) use of aspirin: Secondary | ICD-10-CM | POA: Diagnosis not present

## 2023-08-13 DIAGNOSIS — K633 Ulcer of intestine: Secondary | ICD-10-CM | POA: Diagnosis not present

## 2023-08-13 DIAGNOSIS — E039 Hypothyroidism, unspecified: Secondary | ICD-10-CM | POA: Diagnosis present

## 2023-08-13 DIAGNOSIS — Z96651 Presence of right artificial knee joint: Secondary | ICD-10-CM | POA: Insufficient documentation

## 2023-08-13 DIAGNOSIS — I10 Essential (primary) hypertension: Secondary | ICD-10-CM | POA: Diagnosis present

## 2023-08-13 DIAGNOSIS — D62 Acute posthemorrhagic anemia: Secondary | ICD-10-CM

## 2023-08-13 DIAGNOSIS — N183 Chronic kidney disease, stage 3 unspecified: Secondary | ICD-10-CM | POA: Diagnosis present

## 2023-08-13 DIAGNOSIS — I2510 Atherosclerotic heart disease of native coronary artery without angina pectoris: Secondary | ICD-10-CM | POA: Insufficient documentation

## 2023-08-13 DIAGNOSIS — K922 Gastrointestinal hemorrhage, unspecified: Secondary | ICD-10-CM | POA: Diagnosis not present

## 2023-08-13 DIAGNOSIS — D696 Thrombocytopenia, unspecified: Secondary | ICD-10-CM | POA: Diagnosis not present

## 2023-08-13 DIAGNOSIS — Z85850 Personal history of malignant neoplasm of thyroid: Secondary | ICD-10-CM | POA: Diagnosis not present

## 2023-08-13 DIAGNOSIS — Z79899 Other long term (current) drug therapy: Secondary | ICD-10-CM | POA: Diagnosis not present

## 2023-08-13 DIAGNOSIS — I129 Hypertensive chronic kidney disease with stage 1 through stage 4 chronic kidney disease, or unspecified chronic kidney disease: Secondary | ICD-10-CM | POA: Diagnosis not present

## 2023-08-13 DIAGNOSIS — K5730 Diverticulosis of large intestine without perforation or abscess without bleeding: Secondary | ICD-10-CM

## 2023-08-13 DIAGNOSIS — K921 Melena: Secondary | ICD-10-CM | POA: Diagnosis not present

## 2023-08-13 LAB — CBC WITH DIFFERENTIAL/PLATELET
Abs Immature Granulocytes: 0.02 10*3/uL (ref 0.00–0.07)
Basophils Absolute: 0 10*3/uL (ref 0.0–0.1)
Basophils Relative: 0 %
Eosinophils Absolute: 0 10*3/uL (ref 0.0–0.5)
Eosinophils Relative: 0 %
HCT: 32.4 % — ABNORMAL LOW (ref 39.0–52.0)
Hemoglobin: 10.8 g/dL — ABNORMAL LOW (ref 13.0–17.0)
Immature Granulocytes: 0 %
Lymphocytes Relative: 25 %
Lymphs Abs: 1.6 10*3/uL (ref 0.7–4.0)
MCH: 29 pg (ref 26.0–34.0)
MCHC: 33.3 g/dL (ref 30.0–36.0)
MCV: 86.9 fL (ref 80.0–100.0)
Monocytes Absolute: 0.5 10*3/uL (ref 0.1–1.0)
Monocytes Relative: 7 %
Neutro Abs: 4.3 10*3/uL (ref 1.7–7.7)
Neutrophils Relative %: 68 %
Platelets: 96 10*3/uL — ABNORMAL LOW (ref 150–400)
RBC: 3.73 MIL/uL — ABNORMAL LOW (ref 4.22–5.81)
RDW: 14.6 % (ref 11.5–15.5)
WBC: 6.4 10*3/uL (ref 4.0–10.5)
nRBC: 0 % (ref 0.0–0.2)

## 2023-08-13 LAB — COMPREHENSIVE METABOLIC PANEL
ALT: 18 U/L (ref 0–44)
AST: 19 U/L (ref 15–41)
Albumin: 3.9 g/dL (ref 3.5–5.0)
Alkaline Phosphatase: 88 U/L (ref 38–126)
Anion gap: 12 (ref 5–15)
BUN: 15 mg/dL (ref 8–23)
CO2: 18 mmol/L — ABNORMAL LOW (ref 22–32)
Calcium: 8.5 mg/dL — ABNORMAL LOW (ref 8.9–10.3)
Chloride: 107 mmol/L (ref 98–111)
Creatinine, Ser: 1.42 mg/dL — ABNORMAL HIGH (ref 0.61–1.24)
GFR, Estimated: 51 mL/min — ABNORMAL LOW (ref >60)
Glucose, Bld: 143 mg/dL — ABNORMAL HIGH (ref 70–99)
Potassium: 3.3 mmol/L — ABNORMAL LOW (ref 3.5–5.1)
Sodium: 137 mmol/L (ref 135–145)
Total Bilirubin: 0.9 mg/dL (ref 0.3–1.2)
Total Protein: 6.2 g/dL — ABNORMAL LOW (ref 6.5–8.1)

## 2023-08-13 LAB — CBC
HCT: 37 % — ABNORMAL LOW (ref 39.0–52.0)
Hemoglobin: 11.8 g/dL — ABNORMAL LOW (ref 13.0–17.0)
MCH: 28.9 pg (ref 26.0–34.0)
MCHC: 31.9 g/dL (ref 30.0–36.0)
MCV: 90.5 fL (ref 80.0–100.0)
Platelets: 108 10*3/uL — ABNORMAL LOW (ref 150–400)
RBC: 4.09 MIL/uL — ABNORMAL LOW (ref 4.22–5.81)
RDW: 14.7 % (ref 11.5–15.5)
WBC: 7.6 10*3/uL (ref 4.0–10.5)
nRBC: 0 % (ref 0.0–0.2)

## 2023-08-13 LAB — TYPE AND SCREEN
ABO/RH(D): O POS
Antibody Screen: NEGATIVE

## 2023-08-13 LAB — PROTIME-INR
INR: 1.2 (ref 0.8–1.2)
Prothrombin Time: 14.9 s (ref 11.4–15.2)

## 2023-08-13 LAB — TECHNOLOGIST SMEAR REVIEW: Plt Morphology: NORMAL

## 2023-08-13 MED ORDER — SODIUM CHLORIDE 0.9 % IV SOLN: INTRAVENOUS | Status: DC

## 2023-08-13 MED ORDER — PAROXETINE HCL ER 12.5 MG PO TB24
25.0000 mg | ORAL_TABLET | Freq: Every day | ORAL | Status: DC
  Administered 2023-08-14: 25 mg via ORAL
  Filled 2023-08-13: qty 2

## 2023-08-13 MED ORDER — PEG-KCL-NACL-NASULF-NA ASC-C 100 G PO SOLR
0.5000 | Freq: Once | ORAL | Status: AC
  Administered 2023-08-13: 100 g via ORAL
  Filled 2023-08-13 (×2): qty 1

## 2023-08-13 MED ORDER — PEG-KCL-NACL-NASULF-NA ASC-C 100 G PO SOLR: 1.0000 | Freq: Once | ORAL | Status: DC

## 2023-08-13 MED ORDER — SODIUM CHLORIDE 0.9 % IV SOLN
1.5000 g | Freq: Four times a day (QID) | INTRAVENOUS | Status: AC
  Administered 2023-08-14: 1.5 g via INTRAVENOUS
  Filled 2023-08-13: qty 4

## 2023-08-13 MED ORDER — GABAPENTIN 400 MG PO CAPS
400.0000 mg | ORAL_CAPSULE | Freq: Two times a day (BID) | ORAL | Status: DC
  Administered 2023-08-13: 400 mg via ORAL
  Filled 2023-08-13 (×2): qty 1

## 2023-08-13 MED ORDER — SODIUM CHLORIDE 0.9% FLUSH
3.0000 mL | Freq: Two times a day (BID) | INTRAVENOUS | Status: DC
  Administered 2023-08-13 – 2023-08-14 (×2): 3 mL via INTRAVENOUS

## 2023-08-13 MED ORDER — PEG-KCL-NACL-NASULF-NA ASC-C 100 G PO SOLR
0.5000 | Freq: Once | ORAL | Status: AC
  Administered 2023-08-13: 100 g via ORAL
  Filled 2023-08-13: qty 1

## 2023-08-13 MED ORDER — DUTASTERIDE 0.5 MG PO CAPS
0.5000 mg | ORAL_CAPSULE | Freq: Every day | ORAL | Status: DC
  Filled 2023-08-13: qty 1

## 2023-08-13 MED ORDER — SODIUM CHLORIDE 0.9 % IV SOLN: 250.0000 mL | INTRAVENOUS | Status: DC | PRN

## 2023-08-13 MED ORDER — ALFUZOSIN HCL ER 10 MG PO TB24
10.0000 mg | ORAL_TABLET | Freq: Every day | ORAL | Status: DC
  Administered 2023-08-14: 10 mg via ORAL
  Filled 2023-08-13: qty 1

## 2023-08-13 MED ORDER — SODIUM CHLORIDE 0.9% FLUSH: 3.0000 mL | INTRAVENOUS | Status: DC | PRN

## 2023-08-13 MED ORDER — LEVOTHYROXINE SODIUM 25 MCG PO TABS
125.0000 ug | ORAL_TABLET | Freq: Every day | ORAL | Status: DC
  Administered 2023-08-14: 125 ug via ORAL
  Filled 2023-08-13: qty 1

## 2023-08-13 NOTE — Telephone Encounter (Signed)
Eric Suarez and [name],  This 77 year old man had a surveillance colonoscopy with me yesterday.  Multiple polyps removed by cold snare polypectomy, and 2 transverse colon pedunculated polyps removed by hot snare polypectomy.  Patient on aspirin with no other antiplatelet or anticoagulant agents.  Please see note from [facility] nurse this morning that patient's wife reported he had been bleeding much of the night, though no call to on-call physician.  He has been directed to the [facility] ED for evaluation.  Please be on the look out for him.  H Danis

## 2023-08-13 NOTE — Consult Note (Addendum)
Consultation Note   Referring Provider:  Teaching Service PCP: [name], MD Primary Gastroenterologist: Amada Jupiter, MD        Reason for Consultation: lower GI bleeding  DOA: 08/13/2023         Hospital Day: 1   ASSESSMENT    Brief Narrative:  77 y.o. year old male with a history of CKD3, BPH, recurrent adenomatous colon polyps, thyroid cancer status post thyroidectomy, RCC s/p L nephrectomy, hypertension  Painless hematochezia, suspect post-polypectomy bleed ( maybe transverse colon polyp removed with hot snare).   BP slightly soft but over all stable. Minor decline in hgb on initial labs ( 12.3 >> 11.8).   History knee surgery ( ? Replacement).  Tells me he requires pre-procedure antibiotics.     PLAN:   -Keep NPO for now.  -Trend H/H -Last bloody BM was ~ 4 hours ago. Monitor for now. If persistent bleeding will likely need repeat colonoscopy with control of bleeding.  -I discussed with Teaching Service and recommended observation overnight.    HPI   Eric Suarez has a history of recurrent colon adenomas.  Previous followed by Dr. Kinnie Scales, established care with Dr. Myrtie Neither in May 2024. Yesterday he underwent surveillance colonoscopy .  Three sessile polyps ranging from 5 to 10 mm or removed resumed from the ascending colon with cold snare .  2 pedunculated 12 mm polyps were removed by hot snare from the transverse colon .  Multiple diverticula were found in the left colon .   Around 3 AM patient woke up with urge to have a bowel movement.  He passed some watery stool with blood.  He had an additional 3 episodes prior to leaving home to come to the ED this morning.  He did resume his baby aspirin this morning.  He has no abdominal pain.  No nausea or vomiting.  He has had 1 episode of rectal bleeding with passage of flatus around 5 hours ago.  He otherwise feels well.  No lightheadedness, shortness of breath or dizziness.  His  hemoglobin is down slightly from baseline.   Hemodynamically stable  Hgb stable at 11.8 ( baseline ~ 12)   Previous GI Evaluations   Colonoscopy  08/12/23 - A tattoo was seen in the mid transverse colon. A post-polypectomy scar was found at the tattoo site. - Three 5 to 10 mm polyps in the ascending colon, removed with a cold snare. Resected and retrieved. - Two 12 mm polyps in the transverse colon, removed with a hot snare. Resected and retrieved. - Diverticulosis in the left colon. - The examination was otherwise normal on direct and retroflexion views.  Labs and Imaging: Recent Labs    08/13/23 0909  WBC 7.6  HGB 11.8*  HCT 37.0*  PLT 108*   Recent Labs    08/13/23 0909  NA 137  K 3.3*  CL 107  CO2 18*  GLUCOSE 143*  BUN 15  CREATININE 1.42*  CALCIUM 8.5*   Recent Labs    08/13/23 0909  PROT 6.2*  ALBUMIN 3.9  AST 19  ALT 18  ALKPHOS 88  BILITOT 0.9   No results for input(s): "HEPBSAG", "HCVAB", "HEPAIGM", "HEPBIGM" in the last 72 hours. No results for input(s): "  LABPROT", "INR" in the last 72 hours.    Past Medical History:  Diagnosis Date   Anxiety    Arthritis    BPH (benign prostatic hyperplasia)    Cataracts, bilateral    Chronic back pain    Followed by Dr. Lovell Sheehan   CKD (chronic kidney disease) stage 3, GFR 30-59 ml/min (HCC) 09/2021   Creatinine 1.47   Complication of anesthesia    unable to void after surgery   Coronary artery disease 10/31/2021   Coronary Calcium Score 535   GERD (gastroesophageal reflux disease)    History of hiatal hernia    History of kidney surgery    left kidney removed due to cancer 2017   History of thyroid cancer 1998   Thyroidectomy-now on Synthroid   Hyperlipidemia    Hypertension    Hypothyroidism (acquired) 1998   Post thyroidectomy for thyroid cancer   Macular degeneration of both eyes    dry   Mixed dyslipidemia 09/27/2021   Low HDL, high triglycerides with normal LDL: TC 117, TG 135, HDL 25, LDL 56.    Nocturia    Occipital neuralgia    PONV (postoperative nausea and vomiting)    Renal cell carcinoma, left (HCC) 2017   Left nephrectomy-Dr. Billey Chang, complicated by multiple different hernia surgeries.   Renal cyst     Past Surgical History:  Procedure Laterality Date   APPENDECTOMY     COLONOSCOPY     CYSTOSCOPY WITH RETROGRADE PYELOGRAM, URETEROSCOPY AND STENT PLACEMENT Left 04/01/2016   Procedure: CYSTOSCOPY WITH RETROGRADE PYELOGRAM, left ureter;  Surgeon: Heloise Purpura, MD;  Location: [facility];  Service: Urology;  Laterality: Left;   HEMORRHOID SURGERY     HERNIA REPAIR     inguinal-3 on left , 1 on right   INCISIONAL HERNIA REPAIR N/A 12/23/2016   Procedure: LAPAROSCOPIC VERSUS OPEN LYSIS OF ADHESIONS AND INCISIONAL HERNIA REPAIR WITH MESH;  Surgeon: Axel Filler, MD;  Location: [facility] OR;  Service: General;  Laterality: N/A;   INCISIONAL HERNIA REPAIR  10/08/2017   OPEN   INCISIONAL HERNIA REPAIR N/A 10/08/2017   Procedure: OPEN HERNIA REPAIR INCISIONAL ERAS PATHWAY;  Surgeon: Axel Filler, MD;  Location: [facility] OR;  Service: General;  Laterality: N/A;   INSERTION OF MESH N/A 12/23/2016   Procedure: INSERTION OF MESH;  Surgeon: Axel Filler, MD;  Location: [facility] OR;  Service: General;  Laterality: N/A;   INSERTION OF MESH N/A 01/10/2017   Procedure: INSERTION OF MESH;  Surgeon: Axel Filler, MD;  Location: [facility] OR;  Service: General;  Laterality: N/A;   INSERTION OF MESH N/A 10/08/2017   Procedure: INSERTION OF MESH;  Surgeon: Axel Filler, MD;  Location: [facility] OR;  Service: General;  Laterality: N/A;   LAPAROSCOPIC NEPHRECTOMY Left 04/18/2016   Procedure: LAPAROSCOPIC RADICAL  LEFT NEPHRECTOMY;  Surgeon: Heloise Purpura, MD;  Location: [facility];  Service: Urology;  Laterality: Left;   LAPAROSCOPY N/A 01/10/2017   Procedure: LAPAROSCOPY DIAGNOSTIC , POSSIBLE LAPAROTOMY;  Surgeon: Axel Filler, MD;  Location: [facility] OR;  Service: General;  Laterality: N/A;   NASAL SINUS SURGERY      left side   NASAL SINUS SURGERY Left 10/25/2019   Procedure: ENDOSCOPIC SINUS SURGERY/ethmoid and maxillary;  Surgeon: Serena Colonel, MD;  Location: [facility];  Service: ENT;  Laterality: Left;   THYROIDECTOMY, PARTIAL  1998   left small amount on left side-for cancer   TONSILLECTOMY     TOTAL KNEE ARTHROPLASTY Right 11/19/2022   Procedure: RIGHT TOTAL KNEE  ARTHROPLASTY;  Surgeon: Cammy Copa, MD;  Location: [facility] OR;  Service: Orthopedics;  Laterality: Right;    Family History  Problem Relation Age of Onset   Dementia Mother    Coronary artery disease Father 60       Longstanding smoker   Heart attack Father 101       Second MI was at 29   Hypertension Father    Hyperlipidemia Father    Heart attack Brother 62       Sudden cardiac death from MI   Healthy Son    Liver disease Neg Hx    Esophageal cancer Neg Hx    Colon cancer Neg Hx     Prior to Admission medications   Medication Sig Start Date End Date Taking? Authorizing Provider  acetaminophen (TYLENOL) 325 MG tablet Take 1-2 tablets (325-650 mg total) by mouth every 6 (six) hours as needed for mild pain (pain score 1-3 or temp > 100.5). 11/20/22   Cammy Copa, MD  alfuzosin (UROXATRAL) 10 MG 24 hr tablet Take 10 mg daily by mouth.  12/30/16   [provider]  amoxicillin (AMOXIL) 500 MG tablet Take 500 mg by mouth as needed. 02/17/23   [provider]  Ascorbic Acid (VITAMIN C) 500 MG CHEW Chew 500 mg by mouth daily.    [provider]  aspirin 81 MG chewable tablet Chew 1 tablet (81 mg total) by mouth 2 (two) times daily. 11/20/22   Cammy Copa, MD  atenolol-chlorthalidone (TENORETIC) 50-25 MG tablet Take 1 tablet by mouth daily.    [provider]  Cholecalciferol (VITAMIN D3) 25 MCG (1000 UT) CAPS Take 1,000 Units by mouth daily.    [provider]  dutasteride (AVODART) 0.5 MG capsule Take 0.5 mg daily by mouth.    [provider]   ezetimibe (ZETIA) 10 MG tablet Take 10 mg by mouth daily. 12/12/21   [provider]  gabapentin (NEURONTIN) 300 MG capsule Take 400 mg by mouth 2 (two) times daily.    [provider]  levothyroxine (SYNTHROID) 125 MCG tablet Take 125 mcg by mouth daily before breakfast.    [provider]  methocarbamol (ROBAXIN) 500 MG tablet Take 1 tablet (500 mg total) by mouth every 6 (six) hours as needed for muscle spasms. Patient not taking: Reported on 06/27/2023 11/20/22   Cammy Copa, MD  Multiple Vitamins-Minerals (EYE VITAMINS PO) Take 1 tablet by mouth daily.    [provider]  nitroGLYCERIN (NITROSTAT) 0.4 MG SL tablet Place 0.4 mg under the tongue every 5 (five) minutes as needed for chest pain.    [provider]  omeprazole (PRILOSEC) 20 MG capsule Take 20 mg by mouth daily.    [provider]  OVER THE COUNTER MEDICATION Take 2 tablets daily by mouth. [employer] - Brain Vitamin    [provider]  PARoxetine (PAXIL-CR) 25 MG 24 hr tablet Take 25 mg by mouth daily. 02/21/16   [provider]  potassium chloride SA (K-DUR,KLOR-CON) 20 MEQ tablet Take 20 mEq by mouth every other day.    [provider]  Zinc 100 MG TABS Take 1 tablet by mouth daily.    [provider]    No current facility-administered medications for this encounter.   Current Outpatient Medications  Medication Sig Dispense Refill   acetaminophen (TYLENOL) 325 MG tablet Take 1-2 tablets (325-650 mg total) by mouth every 6 (six) hours as needed for mild pain (pain  score 1-3 or temp > 100.5). 40 tablet 0   alfuzosin (UROXATRAL) 10 MG 24 hr tablet Take 10 mg daily by mouth.   11   amoxicillin (AMOXIL) 500 MG tablet Take 500 mg by mouth as needed.     Ascorbic Acid (VITAMIN C) 500 MG CHEW Chew 500 mg by mouth daily.     aspirin 81 MG chewable tablet Chew 1 tablet (81 mg total) by mouth 2 (two) times daily. 42 tablet 0    atenolol-chlorthalidone (TENORETIC) 50-25 MG tablet Take 1 tablet by mouth daily.     Cholecalciferol (VITAMIN D3) 25 MCG (1000 UT) CAPS Take 1,000 Units by mouth daily.     dutasteride (AVODART) 0.5 MG capsule Take 0.5 mg daily by mouth.     ezetimibe (ZETIA) 10 MG tablet Take 10 mg by mouth daily.     gabapentin (NEURONTIN) 300 MG capsule Take 400 mg by mouth 2 (two) times daily.     levothyroxine (SYNTHROID) 125 MCG tablet Take 125 mcg by mouth daily before breakfast.     methocarbamol (ROBAXIN) 500 MG tablet Take 1 tablet (500 mg total) by mouth every 6 (six) hours as needed for muscle spasms. (Patient not taking: Reported on 06/27/2023) 30 tablet 0   Multiple Vitamins-Minerals (EYE VITAMINS PO) Take 1 tablet by mouth daily.     nitroGLYCERIN (NITROSTAT) 0.4 MG SL tablet Place 0.4 mg under the tongue every 5 (five) minutes as needed for chest pain.     omeprazole (PRILOSEC) 20 MG capsule Take 20 mg by mouth daily.     OVER THE COUNTER MEDICATION Take 2 tablets daily by mouth. Cognium - Brain Vitamin     PARoxetine (PAXIL-CR) 25 MG 24 hr tablet Take 25 mg by mouth daily.  3   potassium chloride SA (K-DUR,KLOR-CON) 20 MEQ tablet Take 20 mEq by mouth every other day.     Zinc 100 MG TABS Take 1 tablet by mouth daily.      Allergies as of 08/13/2023 - Review Complete 08/13/2023  Allergen Reaction Noted   Tamsulosin Nausea And Vomiting and Other (See Comments) 12/25/2016   Fish oil Hives 02/23/2023   Rosuvastatin calcium Other (See Comments) 09/27/2022   Sulfa antibiotics Rash 03/14/2016    Social History   Socioeconomic History   Marital status: Married    Spouse name: Not on file   Number of children: 1   Years of education: Not on file   Highest education level: Not on file  Occupational History   Occupation: retired  Tobacco Use   Smoking status: Never   Smokeless tobacco: Never  Vaping Use   Vaping status: Never Used  Substance and Sexual Activity   Alcohol use: No   Drug  use: No   Sexual activity: Not on file  Other Topics Concern   Not on file  Social History Narrative   Married father 1 son who lives in [place].  He lives with his wife.      Drink 2 cups of coffee a day.      Previously used to exercise quite a bit with significant daily walking and mowing multiple lawns.  (His house, along with several neighbors and the church lawn) now he does still do some yard work and occasional walking.  But since the onset of COVID, has slowly declined in and out of exercise.   Social Determinants of Health   Financial Resource Strain: Not on file  Food Insecurity: Not on file  Transportation  Needs: Not on file  Physical Activity: Not on file  Stress: Not on file  Social Connections: Not on file  Intimate Partner Violence: Not on file     Code Status   Code Status: Prior  Review of Systems: All systems reviewed and negative except where noted in HPI.  Physical Exam: Vital signs in last 24 hours: Temp:  [97.6 F (36.4 C)-97.8 F (36.6 C)] 97.6 F (36.4 C) (10/02 0901) Pulse Rate:  [53-80] 80 (10/02 0901) Resp:  [11-21] 20 (10/02 0901) BP: (103-139)/(64-89) 103/77 (10/02 0901) SpO2:  [95 %-100 %] 97 % (10/02 0901) Weight:  [91.2 kg] 91.2 kg (10/02 0903)    General:  Pleasant male in NAD Psych:  Cooperative. Normal mood and affect Eyes: Pupils equal Ears:  Normal auditory acuity. Slightly HOH Nose: No deformity, discharge or lesions Neck:  Supple, no masses felt Lungs:  Clear to auscultation.  Heart:  Regular rate, regular rhythm.  Abdomen:  Soft, nondistended, nontender, active bowel sounds, no masses felt Rectal :  Deferred Msk: Symmetrical without gross deformities.  Neurologic:  Alert, oriented, grossly normal neurologically Extremities : No edema Skin:  Intact without significant lesions.    Intake/Output from previous day: No intake/output data recorded. Intake/Output this shift:  No intake/output data  recorded.  Active Problems:   * No active hospital problems. Willette Cluster, NP-C   08/13/2023, 12:28 PM

## 2023-08-13 NOTE — ED Provider Notes (Signed)
[place] [facility] AT [facility] Provider Note   CSN: [id number] Arrival date & time: 08/13/23  4696     History  Chief Complaint  Patient presents with   Post-op Problem    Eric Suarez is a 77 y.o. male who got a colonoscopy yesterday with several polyps removed. He reports that he has had to go to the bathroom about 4 times since early this AM due to bloody episodes. One was a bowel movement, the rest were gas. Not leaking through the rectum. Only when breaking wind or has a bowel movement. The comode is all dark red.  Reports diarrhea today and it was  bloody, although non-bloody diarrhea is not unusual for him since he is on Benefiber and has not had it for 5 days.  He denies feeling weak, lightheaded, having chest pain or chest palpitations. He does have a history of bruising easily and thyroid and renal cancer. Reports no history of anemia. He denies feeling his stomach more distended than usual although he feels bloat after the procedure.  He used ASA this AM. Denies other NSAID use.    Past Medical History:  Diagnosis Date   Anxiety    Arthritis    BPH (benign prostatic hyperplasia)    Cataracts, bilateral    Chronic back pain    Followed by Dr. Lovell Sheehan   CKD (chronic kidney disease) stage 3, GFR 30-59 ml/min (HCC) 09/2021   Creatinine 1.47   Complication of anesthesia    unable to void after surgery   Coronary artery disease 10/31/2021   Coronary Calcium Score 535   GERD (gastroesophageal reflux disease)    History of hiatal hernia    History of kidney surgery    left kidney removed due to cancer 2017   History of thyroid cancer 1998   Thyroidectomy-now on Synthroid   Hyperlipidemia    Hypertension    Hypothyroidism (acquired) 1998   Post thyroidectomy for thyroid cancer   Macular degeneration of both eyes    dry   Mixed dyslipidemia 09/27/2021   Low HDL, high triglycerides with normal LDL: TC 117, TG 135, HDL 25, LDL 56.    Nocturia    Occipital neuralgia    PONV (postoperative nausea and vomiting)    Renal cell carcinoma, left (HCC) 2017   Left nephrectomy-Dr. Billey Chang, complicated by multiple different hernia surgeries.   Renal cyst        Home Medications Prior to Admission medications   Medication Sig Start Date End Date Taking? Authorizing Provider  acetaminophen (TYLENOL) 325 MG tablet Take 1-2 tablets (325-650 mg total) by mouth every 6 (six) hours as needed for mild pain (pain score 1-3 or temp > 100.5). 11/20/22   Cammy Copa, MD  alfuzosin (UROXATRAL) 10 MG 24 hr tablet Take 10 mg daily by mouth.  12/30/16   [provider]  Ascorbic Acid (VITAMIN C) 500 MG CHEW Chew 500 mg by mouth daily.    [provider]  aspirin 81 MG chewable tablet Chew 1 tablet (81 mg total) by mouth 2 (two) times daily. 11/20/22   Cammy Copa, MD  atenolol-chlorthalidone (TENORETIC) 50-25 MG tablet Take 1 tablet by mouth daily.    [provider]  Cholecalciferol (VITAMIN D3) 25 MCG (1000 UT) CAPS Take 1,000 Units by mouth daily.    [provider]  dutasteride (AVODART) 0.5 MG capsule Take 0.5 mg daily by mouth.    [provider]  ezetimibe (ZETIA)  10 MG tablet Take 10 mg by mouth daily. 12/12/21   [provider]  gabapentin (NEURONTIN) 300 MG capsule Take 400 mg by mouth 2 (two) times daily.    [provider]  levothyroxine (SYNTHROID) 125 MCG tablet Take 125 mcg by mouth daily before breakfast.    [provider]  Multiple Vitamins-Minerals (EYE VITAMINS PO) Take 1 tablet by mouth daily.    [provider]  nitroGLYCERIN (NITROSTAT) 0.4 MG SL tablet Place 0.4 mg under the tongue every 5 (five) minutes as needed for chest pain.    [provider]  omeprazole (PRILOSEC) 20 MG capsule Take 20 mg by mouth daily.    [provider]  OVER THE COUNTER MEDICATION Take 2 tablets daily by mouth. Cognium - Brain Vitamin     [provider]  PARoxetine (PAXIL-CR) 25 MG 24 hr tablet Take 25 mg by mouth daily. 02/21/16   [provider]  potassium chloride SA (K-DUR,KLOR-CON) 20 MEQ tablet Take 20 mEq by mouth every other day.    [provider]  Zinc 100 MG TABS Take 1 tablet by mouth daily.    [provider]      Allergies    Tamsulosin, Fish oil, Rosuvastatin calcium, and Sulfa antibiotics    Review of Systems   Review of Systems  Constitutional:  Negative for chills, fatigue and fever.  Cardiovascular:  Negative for chest pain, palpitations and leg swelling.  Gastrointestinal:  Positive for blood in stool and diarrhea. Negative for abdominal pain, anal bleeding, constipation, nausea and vomiting.  Genitourinary:  Negative for difficulty urinating and hematuria.  Neurological:  Negative for weakness and light-headedness.    Physical Exam Updated Vital Signs BP 103/77 (BP Location: Right Arm)   Pulse 80   Temp 97.6 F (36.4 C) (Oral)   Resp 20   Ht 6\' 3"  (1.905 m)   Wt 91.2 kg   SpO2 97%   BMI 25.13 kg/m  Physical Exam Constitutional:      General: He is not in acute distress.    Appearance: He is well-developed and well-groomed. He is not ill-appearing or toxic-appearing.  HENT:     Mouth/Throat:     Mouth: Mucous membranes are moist.  Eyes:     General: No scleral icterus. Cardiovascular:     Rate and Rhythm: Normal rate and regular rhythm.     Heart sounds: No murmur heard.    No friction rub. No gallop.  Pulmonary:     Effort: No respiratory distress.     Breath sounds: No wheezing, rhonchi or rales.  Abdominal:     General: Abdomen is flat. Bowel sounds are increased. There is no distension.     Palpations: Abdomen is soft.     Tenderness: There is no abdominal tenderness. There is no guarding or rebound.  Musculoskeletal:     Right lower leg: No edema.     Left lower leg: No edema.  Skin:    Capillary Refill: Capillary refill takes less than  2 seconds.     Coloration: Skin is not jaundiced or pale.     Findings: Bruising present. No lesion.  Neurological:     Mental Status: He is alert.  Psychiatric:        Behavior: Behavior is cooperative.    ED Results / Procedures / Treatments   Labs (all labs ordered are listed, but only abnormal results are displayed) Labs Reviewed  COMPREHENSIVE METABOLIC PANEL - Abnormal; Notable for the  following components:      Result Value   Potassium 3.3 (*)    CO2 18 (*)    Glucose, Bld 143 (*)    Creatinine, Ser 1.42 (*)    Calcium 8.5 (*)    Total Protein 6.2 (*)    GFR, Estimated 51 (*)    All other components within normal limits  CBC - Abnormal; Notable for the following components:   RBC 4.09 (*)    Hemoglobin 11.8 (*)    HCT 37.0 (*)    Platelets 108 (*)    All other components within normal limits  POC OCCULT BLOOD, ED  TYPE AND SCREEN    Medications Ordered in ED Medications - No data to display  ED Course/ Medical Decision Making/ A&P                              Medical Decision Making Amount and/or Complexity of Data Reviewed Labs: ordered.  Risk Decision regarding hospitalization.  Patient has a history of thrombocytopenia passing bright red blood with one episode of hematochezia and blood when passing gas. No incontinence, no acute abdomen. Had multiple polyps removed, on Aspirin, took aspirin again this morning. Hgb is stable. Consulted with GI who advised admitting for monitoring. Patient is in agreement for admission. Admitted to Hospitalist team.   Final Clinical Impression(s) / ED Diagnoses Final diagnoses:  Gastrointestinal hemorrhage, unspecified gastrointestinal hemorrhage type    Rx / DC Orders ED Discharge Orders     None         Hassan Rowan, [place], MD 08/13/23 1302    Lorre Nick, MD 08/14/23 [phone]

## 2023-08-13 NOTE — Telephone Encounter (Signed)
  Follow up Call-     08/12/2023    1:34 PM 08/12/2023    1:29 PM  Call back number  Post procedure Call Back phone  # [phone]   Permission to leave phone message  Yes     Patient questions:  Patient's wife stated that the patient had a lot of blood in the stoll since last night.  Stated that the bleeding started last night.  Told to go to [facility] ASAP.  Note to Dr Myrtie Neither.

## 2023-08-13 NOTE — ED Triage Notes (Signed)
Pt c/o bright red blood in stool since having colonoscopy yesterday. Pt denies blood thinners. Pt denies weakness and dizziness.

## 2023-08-13 NOTE — ED Notes (Signed)
ED TO INPATIENT HANDOFF REPORT  ED Nurse Name and Phone #: Leeroy Bock [phone]  S Name/Age/Gender Eric Suarez 77 y.o. male Room/Bed: 010C/010C  Code Status   Code Status: Full Code  Home/SNF/Other Home Patient oriented to: self, place, time, and situation Is this baseline? Yes   Triage Complete: Triage complete  Chief Complaint Lower GI bleeding [K92.2]  Triage Note Pt c/o bright red blood in stool since having colonoscopy yesterday. Pt denies blood thinners. Pt denies weakness and dizziness.   Allergies Allergies  Allergen Reactions   Tamsulosin Nausea And Vomiting and Other (See Comments)    Uncomfortable in pelvic region   Fish Oil Hives   Rosuvastatin Calcium Other (See Comments)   Sulfa Antibiotics Rash    Level of Care/Admitting Diagnosis ED Disposition     ED Disposition  Admit   Condition  --   Comment  Hospital Area: [facility] [[id number]]  Level of Care: Med-Surg [16]  May place patient in observation at [facility] or [facility] if equivalent level of care is available:: Yes  Covid Evaluation: Asymptomatic - no recent exposure (last 10 days) testing not required  Diagnosis: Lower GI bleeding [536644]  Admitting Physician: Erenest Blank  Attending Physician: Erenest Blank          B Medical/Surgery History Past Medical History:  Diagnosis Date   Anxiety    Arthritis    BPH (benign prostatic hyperplasia)    Cataracts, bilateral    Chronic back pain    Followed by Dr. Lovell Sheehan   CKD (chronic kidney disease) stage 3, GFR 30-59 ml/min (HCC) 09/2021   Creatinine 1.47   Complication of anesthesia    unable to void after surgery   Coronary artery disease 10/31/2021   Coronary Calcium Score 535   GERD (gastroesophageal reflux disease)    History of hiatal hernia    History of kidney surgery    left kidney removed due to cancer 2017   History of thyroid cancer 1998   Thyroidectomy-now on Synthroid    Hyperlipidemia    Hypertension    Hypothyroidism (acquired) 1998   Post thyroidectomy for thyroid cancer   Macular degeneration of both eyes    dry   Mixed dyslipidemia 09/27/2021   Low HDL, high triglycerides with normal LDL: TC 117, TG 135, HDL 25, LDL 56.   Nocturia    Occipital neuralgia    PONV (postoperative nausea and vomiting)    Renal cell carcinoma, left (HCC) 2017   Left nephrectomy-Dr. Billey Chang, complicated by multiple different hernia surgeries.   Renal cyst    Past Surgical History:  Procedure Laterality Date   APPENDECTOMY     COLONOSCOPY     CYSTOSCOPY WITH RETROGRADE PYELOGRAM, URETEROSCOPY AND STENT PLACEMENT Left 04/01/2016   Procedure: CYSTOSCOPY WITH RETROGRADE PYELOGRAM, left ureter;  Surgeon: Heloise Purpura, MD;  Location: [facility];  Service: Urology;  Laterality: Left;   HEMORRHOID SURGERY     HERNIA REPAIR     inguinal-3 on left , 1 on right   INCISIONAL HERNIA REPAIR N/A 12/23/2016   Procedure: LAPAROSCOPIC VERSUS OPEN LYSIS OF ADHESIONS AND INCISIONAL HERNIA REPAIR WITH MESH;  Surgeon: Axel Filler, MD;  Location: [facility] OR;  Service: General;  Laterality: N/A;   INCISIONAL HERNIA REPAIR  10/08/2017   OPEN   INCISIONAL HERNIA REPAIR N/A 10/08/2017   Procedure: OPEN HERNIA REPAIR INCISIONAL ERAS PATHWAY;  Surgeon: Axel Filler, MD;  Location: [facility] OR;  Service: General;  Laterality: N/A;   INSERTION OF MESH N/A 12/23/2016   Procedure: INSERTION OF MESH;  Surgeon: Axel Filler, MD;  Location: [facility] OR;  Service: General;  Laterality: N/A;   INSERTION OF MESH N/A 01/10/2017   Procedure: INSERTION OF MESH;  Surgeon: Axel Filler, MD;  Location: [facility] OR;  Service: General;  Laterality: N/A;   INSERTION OF MESH N/A 10/08/2017   Procedure: INSERTION OF MESH;  Surgeon: Axel Filler, MD;  Location: [facility] OR;  Service: General;  Laterality: N/A;   LAPAROSCOPIC NEPHRECTOMY Left 04/18/2016   Procedure: LAPAROSCOPIC RADICAL  LEFT NEPHRECTOMY;  Surgeon: Heloise Purpura, MD;  Location: [facility];  Service: Urology;  Laterality: Left;   LAPAROSCOPY N/A 01/10/2017   Procedure: LAPAROSCOPY DIAGNOSTIC , POSSIBLE LAPAROTOMY;  Surgeon: Axel Filler, MD;  Location: [facility] OR;  Service: General;  Laterality: N/A;   NASAL SINUS SURGERY     left side   NASAL SINUS SURGERY Left 10/25/2019   Procedure: ENDOSCOPIC SINUS SURGERY/ethmoid and maxillary;  Surgeon: Serena Colonel, MD;  Location: [facility];  Service: ENT;  Laterality: Left;   THYROIDECTOMY, PARTIAL  1998   left small amount on left side-for cancer   TONSILLECTOMY     TOTAL KNEE ARTHROPLASTY Right 11/19/2022   Procedure: RIGHT TOTAL KNEE ARTHROPLASTY;  Surgeon: Cammy Copa, MD;  Location: [facility] OR;  Service: Orthopedics;  Laterality: Right;     A IV Location/Drains/Wounds Patient Lines/Drains/Airways Status     Active Line/Drains/Airways     None            Intake/Output Last 24 hours No intake or output data in the 24 hours ending 08/13/23 1313  Labs/Imaging Results for orders placed or performed during the hospital encounter of 08/13/23 (from the past 48 hour(s))  Comprehensive metabolic panel     Status: Abnormal   Collection Time: 08/13/23  9:09 AM  Result Value Ref Range   Sodium 137 135 - 145 mmol/L   Potassium 3.3 (L) 3.5 - 5.1 mmol/L   Chloride 107 98 - 111 mmol/L   CO2 18 (L) 22 - 32 mmol/L   Glucose, Bld 143 (H) 70 - 99 mg/dL    Comment: Glucose reference range applies only to samples taken after fasting for at least 8 hours.   BUN 15 8 - 23 mg/dL   Creatinine, Ser 2.13 (H) 0.61 - 1.24 mg/dL   Calcium 8.5 (L) 8.9 - 10.3 mg/dL   Total Protein 6.2 (L) 6.5 - 8.1 g/dL   Albumin 3.9 3.5 - 5.0 g/dL   AST 19 15 - 41 U/L   ALT 18 0 - 44 U/L   Alkaline Phosphatase 88 38 - 126 U/L   Total Bilirubin 0.9 0.3 - 1.2 mg/dL   GFR, Estimated 51 (L) >60 mL/min    Comment: (NOTE) Calculated using the CKD-EPI Creatinine Equation (2021)    Anion gap 12 5 - 15    Comment:  Performed at [facility], [address] [place], [place] [address]  CBC     Status: Abnormal   Collection Time: 08/13/23  9:09 AM  Result Value Ref Range   WBC 7.6 4.0 - 10.5 K/uL   RBC 4.09 (L) 4.22 - 5.81 MIL/uL   Hemoglobin 11.8 (L) 13.0 - 17.0 g/dL   HCT 84.6 (L) 96.2 - 95.2 %   MCV 90.5 80.0 - 100.0 fL   MCH 28.9 26.0 - 34.0 pg   MCHC 31.9 30.0 - 36.0 g/dL   RDW 14.7  11.5 - 15.5 %   Platelets 108 (L) 150 - 400 K/uL   nRBC 0.0 0.0 - 0.2 %    Comment: Performed at [facility], [address] [place], [place] [address]  Type and screen [facility]     Status: None   Collection Time: 08/13/23  9:09 AM  Result Value Ref Range   ABO/RH(D) O POS    Antibody Screen NEG    Sample Expiration      08/16/2023,2359 Performed at [facility], [address] [place], [place] [address]    No results found.  Pending Labs [employer] (From admission, onward)     Start     Ordered   Signed and Armed forces training and education officer morning,   R        Signed and Held   Signed and Held  CBC  Tomorrow morning,   R        Signed and Held   Signed and Held  Protime-INR  Once-Timed,   R        Signed and Held   Signed and Held  Hemoglobin  Once-Timed,   R        Signed and Held   Signed and Programmer, systems smear review  Radiographer, therapeutic smear review)  Add-on,   R       Question:  Clinical information:  Answer:  thrombocytopenia   Signed and Held   Signed and Held  Differential  (Technologist smear review)  Add-on,   R        Signed and Held   Signed and Held  HIV Antibody (routine testing w rflx)  (HIV Antibody (Routine testing w reflex) panel)  Tomorrow morning,   R        Signed and Held   Signed and Held  HCV Ab w Reflex to Quant PCR  Tomorrow morning,   R        Signed and Held            Vitals/Pain Today's Vitals   08/13/23 0901 08/13/23 0903  BP: 103/77   Pulse: 80   Resp: 20   Temp: 97.6 F (36.4 C)   TempSrc: Oral    SpO2: 97%   Weight:  91.2 kg  Height:  6\' 3"  (1.905 m)  PainSc:  0-No pain    Isolation Precautions No active isolations  Medications Medications - No data to display  Mobility walks     Focused Assessments    R Recommendations: See Admitting Provider Note  Report given to:   Additional Notes:

## 2023-08-13 NOTE — H&P (View-Only) (Signed)
Consultation Note   Referring Provider:  Teaching Service PCP: Eric Brunette, MD Primary Gastroenterologist: Eric Jupiter, MD        Reason for Consultation: lower GI bleeding  DOA: 08/13/2023         Hospital Day: 1   ASSESSMENT    Brief Narrative:  77 y.o. year old male with a history of CKD3, BPH, recurrent adenomatous colon polyps, thyroid cancer status post thyroidectomy, RCC s/p L nephrectomy, hypertension  Painless hematochezia, suspect post-polypectomy bleed ( maybe transverse colon polyp removed with hot snare).   BP slightly soft but over all stable. Minor decline in hgb on initial labs ( 12.3 >> 11.8).   History knee surgery ( ? Replacement).  Tells me he requires pre-procedure antibiotics.     PLAN:   -Keep NPO for now.  -Trend H/H -Last bloody BM was ~ 4 hours ago. Monitor for now. If persistent bleeding will likely need repeat colonoscopy with control of bleeding.  -I discussed with Teaching Service and recommended observation overnight.    HPI   [name] has a history of recurrent colon adenomas.  Previous followed by Dr. Kinnie Suarez, established care with Dr. Myrtie Suarez in May 2024. Yesterday he underwent surveillance colonoscopy .  Three sessile polyps ranging from 5 to 10 mm or removed resumed from the ascending colon with cold snare .  2 pedunculated 12 mm polyps were removed by hot snare from the transverse colon .  Multiple diverticula were found in the left colon .   Around 3 AM patient woke up with urge to have a bowel movement.  He passed some watery stool with blood.  He had an additional 3 episodes prior to leaving home to come to the ED this morning.  He did resume his baby aspirin this morning.  He has no abdominal pain.  No nausea or vomiting.  He has had 1 episode of rectal bleeding with passage of flatus around 5 hours ago.  He otherwise feels well.  No lightheadedness, shortness of breath or dizziness.  His  hemoglobin is down slightly from baseline.   Hemodynamically stable  Hgb stable at 11.8 ( baseline ~ 12)   Previous GI Evaluations   Colonoscopy  08/12/23 - A tattoo was seen in the mid transverse colon. A post-polypectomy scar was found at the tattoo site. - Three 5 to 10 mm polyps in the ascending colon, removed with a cold snare. Resected and retrieved. - Two 12 mm polyps in the transverse colon, removed with a hot snare. Resected and retrieved. - Diverticulosis in the left colon. - The examination was otherwise normal on direct and retroflexion views.  Labs and Imaging: Recent Labs    08/13/23 0909  WBC 7.6  HGB 11.8*  HCT 37.0*  PLT 108*   Recent Labs    08/13/23 0909  NA 137  K 3.3*  CL 107  CO2 18*  GLUCOSE 143*  BUN 15  CREATININE 1.42*  CALCIUM 8.5*   Recent Labs    08/13/23 0909  PROT 6.2*  ALBUMIN 3.9  AST 19  ALT 18  ALKPHOS 88  BILITOT 0.9   No results for input(s): "HEPBSAG", "HCVAB", "HEPAIGM", "HEPBIGM" in the last 72 hours. No results for input(s): "  LABPROT", "INR" in the last 72 hours.    Past Medical History:  Diagnosis Date   Anxiety    Arthritis    BPH (benign prostatic hyperplasia)    Cataracts, bilateral    Chronic back pain    Followed by Dr. Lovell Suarez   CKD (chronic kidney disease) stage 3, GFR 30-59 ml/min (HCC) 09/2021   Creatinine 1.47   Complication of anesthesia    unable to void after surgery   Coronary artery disease 10/31/2021   Coronary Calcium Score 535   GERD (gastroesophageal reflux disease)    History of hiatal hernia    History of kidney surgery    left kidney removed due to cancer 2017   History of thyroid cancer 1998   Thyroidectomy-now on Synthroid   Hyperlipidemia    Hypertension    Hypothyroidism (acquired) 1998   Post thyroidectomy for thyroid cancer   Macular degeneration of both eyes    dry   Mixed dyslipidemia 09/27/2021   Low HDL, high triglycerides with normal LDL: TC 117, TG 135, HDL 25, LDL 56.    Nocturia    Occipital neuralgia    PONV (postoperative nausea and vomiting)    Renal cell carcinoma, left (HCC) 2017   Left nephrectomy-Eric Suarez, complicated by multiple different hernia surgeries.   Renal cyst     Past Surgical History:  Procedure Laterality Date   APPENDECTOMY     COLONOSCOPY     CYSTOSCOPY WITH RETROGRADE PYELOGRAM, URETEROSCOPY AND STENT PLACEMENT Left 04/01/2016   Procedure: CYSTOSCOPY WITH RETROGRADE PYELOGRAM, left ureter;  Surgeon: Eric Purpura, MD;  Location: [facility];  Service: Urology;  Laterality: Left;   HEMORRHOID SURGERY     HERNIA REPAIR     inguinal-3 on left , 1 on right   INCISIONAL HERNIA REPAIR N/A 12/23/2016   Procedure: LAPAROSCOPIC VERSUS OPEN LYSIS OF ADHESIONS AND INCISIONAL HERNIA REPAIR WITH MESH;  Surgeon: Eric Filler, MD;  Location: [facility] OR;  Service: General;  Laterality: N/A;   INCISIONAL HERNIA REPAIR  10/08/2017   OPEN   INCISIONAL HERNIA REPAIR N/A 10/08/2017   Procedure: OPEN HERNIA REPAIR INCISIONAL ERAS PATHWAY;  Surgeon: Eric Filler, MD;  Location: [facility] OR;  Service: General;  Laterality: N/A;   INSERTION OF MESH N/A 12/23/2016   Procedure: INSERTION OF MESH;  Surgeon: Eric Filler, MD;  Location: [facility] OR;  Service: General;  Laterality: N/A;   INSERTION OF MESH N/A 01/10/2017   Procedure: INSERTION OF MESH;  Surgeon: Eric Filler, MD;  Location: [facility] OR;  Service: General;  Laterality: N/A;   INSERTION OF MESH N/A 10/08/2017   Procedure: INSERTION OF MESH;  Surgeon: Eric Filler, MD;  Location: [facility] OR;  Service: General;  Laterality: N/A;   LAPAROSCOPIC NEPHRECTOMY Left 04/18/2016   Procedure: LAPAROSCOPIC RADICAL  LEFT NEPHRECTOMY;  Surgeon: Eric Purpura, MD;  Location: [facility];  Service: Urology;  Laterality: Left;   LAPAROSCOPY N/A 01/10/2017   Procedure: LAPAROSCOPY DIAGNOSTIC , POSSIBLE LAPAROTOMY;  Surgeon: Eric Filler, MD;  Location: [facility] OR;  Service: General;  Laterality: N/A;   NASAL SINUS SURGERY      left side   NASAL SINUS SURGERY Left 10/25/2019   Procedure: ENDOSCOPIC SINUS SURGERY/ethmoid and maxillary;  Surgeon: Eric Colonel, MD;  Location: [facility];  Service: ENT;  Laterality: Left;   THYROIDECTOMY, PARTIAL  1998   left small amount on left side-for cancer   TONSILLECTOMY     TOTAL KNEE ARTHROPLASTY Right 11/19/2022   Procedure: RIGHT TOTAL KNEE  ARTHROPLASTY;  Surgeon: Cammy Copa, MD;  Location: [facility];  Service: Orthopedics;  Laterality: Right;    Family History  Problem Relation Age of Onset   Dementia Mother    Coronary artery disease Father 60       Longstanding smoker   Heart attack Father 101       Second MI was at 29   Hypertension Father    Hyperlipidemia Father    Heart attack Brother 62       Sudden cardiac death from MI   Healthy Son    Liver disease Neg Hx    Esophageal cancer Neg Hx    Colon cancer Neg Hx     Prior to Admission medications   Medication Sig Start Date End Date Taking? Authorizing Provider  acetaminophen (TYLENOL) 325 MG tablet Take 1-2 tablets (325-650 mg total) by mouth every 6 (six) hours as needed for mild pain (pain score 1-3 or temp > 100.5). 11/20/22   Cammy Copa, MD  alfuzosin (UROXATRAL) 10 MG 24 hr tablet Take 10 mg daily by mouth.  12/30/16   [provider]  amoxicillin (AMOXIL) 500 MG tablet Take 500 mg by mouth as needed. 02/17/23   [provider]  Ascorbic Acid (VITAMIN C) 500 MG CHEW Chew 500 mg by mouth daily.    [provider]  aspirin 81 MG chewable tablet Chew 1 tablet (81 mg total) by mouth 2 (two) times daily. 11/20/22   Cammy Copa, MD  atenolol-chlorthalidone (TENORETIC) 50-25 MG tablet Take 1 tablet by mouth daily.    [provider]  Cholecalciferol (VITAMIN D3) 25 MCG (1000 UT) CAPS Take 1,000 Units by mouth daily.    [provider]  dutasteride (AVODART) 0.5 MG capsule Take 0.5 mg daily by mouth.    [provider]   ezetimibe (ZETIA) 10 MG tablet Take 10 mg by mouth daily. 12/12/21   [provider]  gabapentin (NEURONTIN) 300 MG capsule Take 400 mg by mouth 2 (two) times daily.    [provider]  levothyroxine (SYNTHROID) 125 MCG tablet Take 125 mcg by mouth daily before breakfast.    [provider]  methocarbamol (ROBAXIN) 500 MG tablet Take 1 tablet (500 mg total) by mouth every 6 (six) hours as needed for muscle spasms. Patient not taking: Reported on 06/27/2023 11/20/22   Cammy Copa, MD  Multiple Vitamins-Minerals (EYE VITAMINS PO) Take 1 tablet by mouth daily.    [provider]  nitroGLYCERIN (NITROSTAT) 0.4 MG SL tablet Place 0.4 mg under the tongue every 5 (five) minutes as needed for chest pain.    [provider]  omeprazole (PRILOSEC) 20 MG capsule Take 20 mg by mouth daily.    [provider]  OVER THE COUNTER MEDICATION Take 2 tablets daily by mouth. [employer] - Brain Vitamin    [provider]  PARoxetine (PAXIL-CR) 25 MG 24 hr tablet Take 25 mg by mouth daily. 02/21/16   [provider]  potassium chloride SA (K-DUR,KLOR-CON) 20 MEQ tablet Take 20 mEq by mouth every other day.    [provider]  Zinc 100 MG TABS Take 1 tablet by mouth daily.    [provider]    No current facility-administered medications for this encounter.   Current Outpatient Medications  Medication Sig Dispense Refill   acetaminophen (TYLENOL) 325 MG tablet Take 1-2 tablets (325-650 mg total) by mouth every 6 (six) hours as needed for mild pain (pain  score 1-3 or temp > 100.5). 40 tablet 0   alfuzosin (UROXATRAL) 10 MG 24 hr tablet Take 10 mg daily by mouth.   11   amoxicillin (AMOXIL) 500 MG tablet Take 500 mg by mouth as needed.     Ascorbic Acid (VITAMIN C) 500 MG CHEW Chew 500 mg by mouth daily.     aspirin 81 MG chewable tablet Chew 1 tablet (81 mg total) by mouth 2 (two) times daily. 42 tablet 0    atenolol-chlorthalidone (TENORETIC) 50-25 MG tablet Take 1 tablet by mouth daily.     Cholecalciferol (VITAMIN D3) 25 MCG (1000 UT) CAPS Take 1,000 Units by mouth daily.     dutasteride (AVODART) 0.5 MG capsule Take 0.5 mg daily by mouth.     ezetimibe (ZETIA) 10 MG tablet Take 10 mg by mouth daily.     gabapentin (NEURONTIN) 300 MG capsule Take 400 mg by mouth 2 (two) times daily.     levothyroxine (SYNTHROID) 125 MCG tablet Take 125 mcg by mouth daily before breakfast.     methocarbamol (ROBAXIN) 500 MG tablet Take 1 tablet (500 mg total) by mouth every 6 (six) hours as needed for muscle spasms. (Patient not taking: Reported on 06/27/2023) 30 tablet 0   Multiple Vitamins-Minerals (EYE VITAMINS PO) Take 1 tablet by mouth daily.     nitroGLYCERIN (NITROSTAT) 0.4 MG SL tablet Place 0.4 mg under the tongue every 5 (five) minutes as needed for chest pain.     omeprazole (PRILOSEC) 20 MG capsule Take 20 mg by mouth daily.     OVER THE COUNTER MEDICATION Take 2 tablets daily by mouth. [employer] - Brain Vitamin     PARoxetine (PAXIL-CR) 25 MG 24 hr tablet Take 25 mg by mouth daily.  3   potassium chloride SA (K-DUR,KLOR-CON) 20 MEQ tablet Take 20 mEq by mouth every other day.     Zinc 100 MG TABS Take 1 tablet by mouth daily.      Allergies as of 08/13/2023 - Review Complete 08/13/2023  Allergen Reaction Noted   Tamsulosin Nausea And Vomiting and Other (See Comments) 12/25/2016   Fish oil Hives 02/23/2023   Rosuvastatin calcium Other (See Comments) 09/27/2022   Sulfa antibiotics Rash 03/14/2016    Social History   Socioeconomic History   Marital status: Married    Spouse name: Not on file   Number of children: 1   Years of education: Not on file   Highest education level: Not on file  Occupational History   Occupation: retired  Tobacco Use   Smoking status: Never   Smokeless tobacco: Never  Vaping Use   Vaping status: Never Used  Substance and Sexual Activity   Alcohol use: No   Drug  use: No   Sexual activity: Not on file  Other Topics Concern   Not on file  Social History Narrative   Married father 1 son who lives in [place].  He lives with his wife.      Drink 2 cups of coffee a day.      Previously used to exercise quite a bit with significant daily walking and mowing multiple lawns.  (His house, along with several neighbors and the church lawn) now he does still do some yard work and occasional walking.  But since the onset of COVID, has slowly declined in and out of exercise.   Social Determinants of Health   Financial Resource Strain: Not on file  Food Insecurity: Not on file  Transportation  Needs: Not on file  Physical Activity: Not on file  Stress: Not on file  Social Connections: Not on file  Intimate Partner Violence: Not on file     Code Status   Code Status: Prior  Review of Systems: All systems reviewed and negative except where noted in HPI.  Physical Exam: Vital signs in last 24 hours: Temp:  [97.6 F (36.4 C)-97.8 F (36.6 C)] 97.6 F (36.4 C) (10/02 0901) Pulse Rate:  [53-80] 80 (10/02 0901) Resp:  [11-21] 20 (10/02 0901) BP: (103-139)/(64-89) 103/77 (10/02 0901) SpO2:  [95 %-100 %] 97 % (10/02 0901) Weight:  [91.2 kg] 91.2 kg (10/02 0903)    General:  Pleasant male in NAD Psych:  Cooperative. Normal mood and affect Eyes: Pupils equal Ears:  Normal auditory acuity. Slightly HOH Nose: No deformity, discharge or lesions Neck:  Supple, no masses felt Lungs:  Clear to auscultation.  Heart:  Regular rate, regular rhythm.  Abdomen:  Soft, nondistended, nontender, active bowel sounds, no masses felt Rectal :  Deferred Msk: Symmetrical without gross deformities.  Neurologic:  Alert, oriented, grossly normal neurologically Extremities : No edema Skin:  Intact without significant lesions.    Intake/Output from previous day: No intake/output data recorded. Intake/Output this shift:  No intake/output data  recorded.  Active Problems:   * No active hospital problems. Willette Cluster, NP-C   08/13/2023, 12:28 PM

## 2023-08-13 NOTE — H&P (Addendum)
History and Physical    ROMMEL PANDA ZOX:[id number] DOB: [birth year] DOA: 08/13/2023  PCP: [name], MD  Patient coming from: home   Chief Complaint: hematochezia  HPI: Eric Suarez is a 77 y.o. male with medical history significant for renal cell cancer s/p nephrectomy, thyroid cancer s/p resection and resulting hypothroid, ckd 3a, htn, cad on cardiac CT, who presents with the above.  Had 5 polyps removed yesterday during routine surveillance colonoscopoy. Began experiencing painless hematochezia this morning, several small episodes. No abd pain, no diarrhea, no fever. No lightheadedness or palpitations. Takes asa but no other nsaids. No hx gi bleed. No toxic habits.    Review of Systems: As per HPI otherwise 10 point review of systems negative.    Past Medical History:  Diagnosis Date   Anxiety    Arthritis    BPH (benign prostatic hyperplasia)    Cataracts, bilateral    Chronic back pain    Followed by Dr. Lovell Sheehan   CKD (chronic kidney disease) stage 3, GFR 30-59 ml/min (HCC) 09/2021   Creatinine 1.47   Complication of anesthesia    unable to void after surgery   Coronary artery disease 10/31/2021   Coronary Calcium Score 535   GERD (gastroesophageal reflux disease)    History of hiatal hernia    History of kidney surgery    left kidney removed due to cancer 2017   History of thyroid cancer 1998   Thyroidectomy-now on Synthroid   Hyperlipidemia    Hypertension    Hypothyroidism (acquired) 1998   Post thyroidectomy for thyroid cancer   Macular degeneration of both eyes    dry   Mixed dyslipidemia 09/27/2021   Low HDL, high triglycerides with normal LDL: TC 117, TG 135, HDL 25, LDL 56.   Nocturia    Occipital neuralgia    PONV (postoperative nausea and vomiting)    Renal cell carcinoma, left (HCC) 2017   Left nephrectomy-Dr. Billey Chang, complicated by multiple different hernia surgeries.   Renal cyst     Past Surgical History:  Procedure Laterality  Date   APPENDECTOMY     COLONOSCOPY     CYSTOSCOPY WITH RETROGRADE PYELOGRAM, URETEROSCOPY AND STENT PLACEMENT Left 04/01/2016   Procedure: CYSTOSCOPY WITH RETROGRADE PYELOGRAM, left ureter;  Surgeon: Heloise Purpura, MD;  Location: [facility];  Service: Urology;  Laterality: Left;   HEMORRHOID SURGERY     HERNIA REPAIR     inguinal-3 on left , 1 on right   INCISIONAL HERNIA REPAIR N/A 12/23/2016   Procedure: LAPAROSCOPIC VERSUS OPEN LYSIS OF ADHESIONS AND INCISIONAL HERNIA REPAIR WITH MESH;  Surgeon: Axel Filler, MD;  Location: [facility];  Service: General;  Laterality: N/A;   INCISIONAL HERNIA REPAIR  10/08/2017   OPEN   INCISIONAL HERNIA REPAIR N/A 10/08/2017   Procedure: OPEN HERNIA REPAIR INCISIONAL ERAS PATHWAY;  Surgeon: Axel Filler, MD;  Location: [facility] OR;  Service: General;  Laterality: N/A;   INSERTION OF MESH N/A 12/23/2016   Procedure: INSERTION OF MESH;  Surgeon: Axel Filler, MD;  Location: [facility] OR;  Service: General;  Laterality: N/A;   INSERTION OF MESH N/A 01/10/2017   Procedure: INSERTION OF MESH;  Surgeon: Axel Filler, MD;  Location: [facility] OR;  Service: General;  Laterality: N/A;   INSERTION OF MESH N/A 10/08/2017   Procedure: INSERTION OF MESH;  Surgeon: Axel Filler, MD;  Location: [facility] OR;  Service: General;  Laterality: N/A;   LAPAROSCOPIC NEPHRECTOMY Left 04/18/2016   Procedure: LAPAROSCOPIC RADICAL  LEFT NEPHRECTOMY;  Surgeon: Heloise Purpura, MD;  Location: [facility];  Service: Urology;  Laterality: Left;   LAPAROSCOPY N/A 01/10/2017   Procedure: LAPAROSCOPY DIAGNOSTIC , POSSIBLE LAPAROTOMY;  Surgeon: Axel Filler, MD;  Location: [facility] OR;  Service: General;  Laterality: N/A;   NASAL SINUS SURGERY     left side   NASAL SINUS SURGERY Left 10/25/2019   Procedure: ENDOSCOPIC SINUS SURGERY/ethmoid and maxillary;  Surgeon: Serena Colonel, MD;  Location: [facility];  Service: ENT;  Laterality: Left;   THYROIDECTOMY, PARTIAL  1998   left small amount on left  side-for cancer   TONSILLECTOMY     TOTAL KNEE ARTHROPLASTY Right 11/19/2022   Procedure: RIGHT TOTAL KNEE ARTHROPLASTY;  Surgeon: Cammy Copa, MD;  Location: [facility] OR;  Service: Orthopedics;  Laterality: Right;     reports that he has never smoked. He has never used smokeless tobacco. He reports that he does not drink alcohol and does not use drugs.  Allergies  Allergen Reactions   Tamsulosin Nausea And Vomiting and Other (See Comments)    Uncomfortable in pelvic region   Fish Oil Hives   Rosuvastatin Calcium Other (See Comments)   Sulfa Antibiotics Rash    Family History  Problem Relation Age of Onset   Dementia Mother    Coronary artery disease Father 14       Longstanding smoker   Heart attack Father 20       Second MI was at 34   Hypertension Father    Hyperlipidemia Father    Heart attack Brother 46       Sudden cardiac death from MI   Healthy Son    Liver disease Neg Hx    Esophageal cancer Neg Hx    Colon cancer Neg Hx     Prior to Admission medications   Medication Sig Start Date End Date Taking? Authorizing Provider  acetaminophen (TYLENOL) 325 MG tablet Take 1-2 tablets (325-650 mg total) by mouth every 6 (six) hours as needed for mild pain (pain score 1-3 or temp > 100.5). 11/20/22   Cammy Copa, MD  alfuzosin (UROXATRAL) 10 MG 24 hr tablet Take 10 mg daily by mouth.  12/30/16   [provider]  Ascorbic Acid (VITAMIN C) 500 MG CHEW Chew 500 mg by mouth daily.    [provider]  aspirin 81 MG chewable tablet Chew 1 tablet (81 mg total) by mouth 2 (two) times daily. 11/20/22   Cammy Copa, MD  atenolol-chlorthalidone (TENORETIC) 50-25 MG tablet Take 1 tablet by mouth daily.    [provider]  Cholecalciferol (VITAMIN D3) 25 MCG (1000 UT) CAPS Take 1,000 Units by mouth daily.    [provider]  dutasteride (AVODART) 0.5 MG capsule Take 0.5 mg daily by mouth.    [provider]  ezetimibe (ZETIA) 10  MG tablet Take 10 mg by mouth daily. 12/12/21   [provider]  gabapentin (NEURONTIN) 300 MG capsule Take 400 mg by mouth 2 (two) times daily.    [provider]  levothyroxine (SYNTHROID) 125 MCG tablet Take 125 mcg by mouth daily before breakfast.    [provider]  Multiple Vitamins-Minerals (EYE VITAMINS PO) Take 1 tablet by mouth daily.    [provider]  nitroGLYCERIN (NITROSTAT) 0.4 MG SL tablet Place 0.4 mg under the tongue every 5 (five) minutes as needed for chest pain.    [provider]  omeprazole (PRILOSEC) 20 MG capsule Take 20 mg by mouth  daily.    [provider]  OVER THE COUNTER MEDICATION Take 2 tablets daily by mouth. Cognium - Brain Vitamin    [provider]  PARoxetine (PAXIL-CR) 25 MG 24 hr tablet Take 25 mg by mouth daily. 02/21/16   [provider]  potassium chloride SA (K-DUR,KLOR-CON) 20 MEQ tablet Take 20 mEq by mouth every other day.    [provider]  Zinc 100 MG TABS Take 1 tablet by mouth daily.    [provider]    Physical Exam: Vitals:   08/13/23 0901 08/13/23 0903  BP: 103/77   Pulse: 80   Resp: 20   Temp: 97.6 F (36.4 C)   TempSrc: Oral   SpO2: 97%   Weight:  91.2 kg  Height:  6\' 3"  (1.905 m)    Constitutional: No acute distress Head: Atraumatic Eyes: Conjunctiva clear ENM: Moist mucous membranes. Normal dentition.  Neck: Supple Respiratory: Clear to auscultation bilaterally, no wheezing/rales/rhonchi. Normal respiratory effort. No accessory muscle use. . Cardiovascular: Regular rate and rhythm. No murmurs/rubs/gallops. Abdomen: Non-tender, non-distended. No masses. No rebound or guarding. Positive bowel sounds. Musculoskeletal: No joint deformity upper and lower extremities. Normal ROM, no contractures. Normal muscle tone.  Skin: No rashes, lesions, or ulcers.  Extremities: No peripheral edema. Palpable peripheral pulses. Neurologic: Alert, moving  all 4 extremities. Psychiatric: Normal insight and judgement.   Labs on Admission: I have personally reviewed following labs and imaging studies  CBC: Recent Labs  Lab 08/13/23 0909  WBC 7.6  HGB 11.8*  HCT 37.0*  MCV 90.5  PLT 108*   Basic Metabolic Panel: Recent Labs  Lab 08/13/23 0909  NA 137  K 3.3*  CL 107  CO2 18*  GLUCOSE 143*  BUN 15  CREATININE 1.42*  CALCIUM 8.5*   GFR: Estimated Creatinine Clearance: 52.1 mL/min (A) (by C-G formula based on SCr of 1.42 mg/dL (H)). Liver Function Tests: Recent Labs  Lab 08/13/23 0909  AST 19  ALT 18  ALKPHOS 88  BILITOT 0.9  PROT 6.2*  ALBUMIN 3.9   No results for input(s): "LIPASE", "AMYLASE" in the last 168 hours. No results for input(s): "AMMONIA" in the last 168 hours. Coagulation Profile: No results for input(s): "INR", "PROTIME" in the last 168 hours. Cardiac Enzymes: No results for input(s): "CKTOTAL", "CKMB", "CKMBINDEX", "TROPONINI" in the last 168 hours. BNP (last 3 results) No results for input(s): "PROBNP" in the last 8760 hours. HbA1C: No results for input(s): "HGBA1C" in the last 72 hours. CBG: No results for input(s): "GLUCAP" in the last 168 hours. Lipid Profile: No results for input(s): "CHOL", "HDL", "LDLCALC", "TRIG", "CHOLHDL", "LDLDIRECT" in the last 72 hours. Thyroid Function Tests: No results for input(s): "TSH", "T4TOTAL", "FREET4", "T3FREE", "THYROIDAB" in the last 72 hours. Anemia Panel: No results for input(s): "VITAMINB12", "FOLATE", "FERRITIN", "TIBC", "IRON", "RETICCTPCT" in the last 72 hours. Urine analysis:    Component Value Date/Time   COLORURINE YELLOW 11/05/2022 1001   APPEARANCEUR CLEAR 11/05/2022 1001   LABSPEC 1.019 11/05/2022 1001   PHURINE 5.0 11/05/2022 1001   GLUCOSEU NEGATIVE 11/05/2022 1001   HGBUR NEGATIVE 11/05/2022 1001   BILIRUBINUR NEGATIVE 11/05/2022 1001   KETONESUR NEGATIVE 11/05/2022 1001   PROTEINUR NEGATIVE 11/05/2022 1001   NITRITE NEGATIVE  11/05/2022 1001   LEUKOCYTESUR TRACE (A) 11/05/2022 1001    Radiological Exams on Admission: No results found.   Assessment/Plan Principal Problem:   Lower GI bleeding Active Problems:   CKD (chronic kidney disease) stage 3, GFR 30-59 ml/min (  HCC)   Hypothyroidism   Essential hypertension   Thrombocytopenia (HCC)   # Lower GI bleed Almost certainly complication of polypectomy yesterday. Low concern for perforation given absence of abdominal pain. Hgb 11.8 which is stable from most recent (12.3 9 months ago). Hemodynamically stable - GI consulted, recs pending, appears plan will be monitoring for now - trend hgb - hold home asa - diet per GI, NPO for now  # Thrombocytopenia Mild and chronic - f/u smear, hcv, hiv  # CKD 3b Cr 1.42 is at baseline - monitor  # MDD - cont home paxil  # Hypothyroid, postsurgical - cont home levo  # CAD  Seen on cardiac CT - home asa on hold  # HTN Here bp low normal in setting of gi bleed - hold home atenolol/chlorthalidone  # GU - cont home alfuzosin and dutasteride  # Chronic pain - home gabapentin    DVT prophylaxis: SCDs Code Status: full  Family Communication: wife updated @ bedside  Consults called: GI   Level of care: Med-Surg Status is: Observation The patient remains OBS appropriate and will d/c before 2 midnights.    Silvano Bilis MD [facility] Pager [phone]  If 7PM-7AM, please contact night-coverage [email or web address] Password [email or web address]  08/13/2023, 1:19 PM

## 2023-08-13 NOTE — ED Provider Notes (Signed)
I saw and evaluated the patient, reviewed the resident's note and I agree with the findings and plan.   77 year old male notes with rectal bleeding after colonoscopy where he had several polyps removed.  Hemoglobin stable at this time.  Discussed with GI and recommend admit for observation.   Lorre Nick, MD 08/13/23 1239

## 2023-08-14 ENCOUNTER — Encounter (HOSPITAL_COMMUNITY)
Admission: EM | Disposition: A | Discharge status: Home / Self Care | Payer: Self-pay | Source: Home / Self Care | Attending: Emergency Medicine

## 2023-08-14 ENCOUNTER — Observation Stay (HOSPITAL_COMMUNITY): Payer: PPO | Admitting: Certified Registered"

## 2023-08-14 ENCOUNTER — Encounter (HOSPITAL_COMMUNITY): Payer: Self-pay | Admitting: Obstetrics and Gynecology

## 2023-08-14 DIAGNOSIS — K5730 Diverticulosis of large intestine without perforation or abscess without bleeding: Secondary | ICD-10-CM

## 2023-08-14 DIAGNOSIS — I2510 Atherosclerotic heart disease of native coronary artery without angina pectoris: Secondary | ICD-10-CM

## 2023-08-14 DIAGNOSIS — D126 Benign neoplasm of colon, unspecified: Secondary | ICD-10-CM

## 2023-08-14 DIAGNOSIS — Z860100 Personal history of colon polyps, unspecified: Secondary | ICD-10-CM | POA: Diagnosis not present

## 2023-08-14 DIAGNOSIS — I10 Essential (primary) hypertension: Secondary | ICD-10-CM | POA: Diagnosis not present

## 2023-08-14 DIAGNOSIS — K922 Gastrointestinal hemorrhage, unspecified: Secondary | ICD-10-CM | POA: Diagnosis not present

## 2023-08-14 DIAGNOSIS — K633 Ulcer of intestine: Secondary | ICD-10-CM | POA: Diagnosis not present

## 2023-08-14 DIAGNOSIS — E039 Hypothyroidism, unspecified: Secondary | ICD-10-CM | POA: Diagnosis not present

## 2023-08-14 DIAGNOSIS — D62 Acute posthemorrhagic anemia: Secondary | ICD-10-CM

## 2023-08-14 HISTORY — PX: HEMOSTASIS CLIP PLACEMENT: SHX6857

## 2023-08-14 HISTORY — PX: COLONOSCOPY WITH PROPOFOL: SHX5780

## 2023-08-14 LAB — CBC
HCT: 34 % — ABNORMAL LOW (ref 39.0–52.0)
Hemoglobin: 11 g/dL — ABNORMAL LOW (ref 13.0–17.0)
MCH: 28.5 pg (ref 26.0–34.0)
MCHC: 32.4 g/dL (ref 30.0–36.0)
MCV: 88.1 fL (ref 80.0–100.0)
Platelets: 98 10*3/uL — ABNORMAL LOW (ref 150–400)
RBC: 3.86 MIL/uL — ABNORMAL LOW (ref 4.22–5.81)
RDW: 14.6 % (ref 11.5–15.5)
WBC: 4.8 10*3/uL (ref 4.0–10.5)
nRBC: 0 % (ref 0.0–0.2)

## 2023-08-14 LAB — BASIC METABOLIC PANEL
Anion gap: 15 (ref 5–15)
BUN: 13 mg/dL (ref 8–23)
CO2: 19 mmol/L — ABNORMAL LOW (ref 22–32)
Calcium: 8.2 mg/dL — ABNORMAL LOW (ref 8.9–10.3)
Chloride: 104 mmol/L (ref 98–111)
Creatinine, Ser: 1.42 mg/dL — ABNORMAL HIGH (ref 0.61–1.24)
GFR, Estimated: 51 mL/min — ABNORMAL LOW (ref >60)
Glucose, Bld: 111 mg/dL — ABNORMAL HIGH (ref 70–99)
Potassium: 3.5 mmol/L (ref 3.5–5.1)
Sodium: 138 mmol/L (ref 135–145)

## 2023-08-14 LAB — HIV ANTIBODY (ROUTINE TESTING W REFLEX): HIV Screen 4th Generation wRfx: NONREACTIVE

## 2023-08-14 SURGERY — COLONOSCOPY WITH PROPOFOL
Anesthesia: Monitor Anesthesia Care

## 2023-08-14 MED ORDER — PROPOFOL 500 MG/50ML IV EMUL
INTRAVENOUS | Status: DC | PRN
  Administered 2023-08-14: 150 ug/kg/min via INTRAVENOUS

## 2023-08-14 MED ORDER — LIDOCAINE 2% (20 MG/ML) 5 ML SYRINGE
INTRAMUSCULAR | Status: DC | PRN
  Administered 2023-08-14: 60 mg via INTRAVENOUS

## 2023-08-14 MED ORDER — PROPOFOL 10 MG/ML IV BOLUS
INTRAVENOUS | Status: DC | PRN
  Administered 2023-08-14: 50 mg via INTRAVENOUS

## 2023-08-14 MED ORDER — LACTATED RINGERS IV SOLN: INTRAVENOUS | Status: DC

## 2023-08-14 SURGICAL SUPPLY — 22 items

## 2023-08-14 NOTE — Op Note (Signed)
[facility] Patient Name: Eric Suarez Procedure Date : 08/14/2023 MRN: [id number] Attending MD: Doristine Locks , MD, [id number] Date of Birth: May 16, 1946 CSN: [id number] Age: 77 Admit Type: Inpatient Procedure:                Colonoscopy Indications:              Hematochezia, Acute post hemorrhagic anemia                           77 year old male with colonoscopy on 08/12/2023                            notable for tattoo in the mid transverse colon with                            previous post polypectomy scar, 3 polyps removed                            from the ascending colon via cold snare and 2                            polyps removed in the transverse colon via hot                            snare, admitted with hematochezia and ABLA. Providers:                Doristine Locks, MD, Eliberto Ivory, RN, Sunday Corn                            [place], Technician Referring MD:              Medicines:                Monitored Anesthesia Care Complications:            No immediate complications. Estimated Blood Loss:     Estimated blood loss: none. Procedure:                Pre-Anesthesia Assessment:                           - Prior to the procedure, a History and Physical                            was performed, and patient medications and                            allergies were reviewed. The patient's tolerance of                            previous anesthesia was also reviewed. The risks                            and benefits of the procedure and the sedation  options and risks were discussed with the patient.                            All questions were answered, and informed consent                            was obtained. Prior Anticoagulants: The patient has                            taken no anticoagulant or antiplatelet agents. ASA                            Grade Assessment: III - A patient with severe                             systemic disease. After reviewing the risks and                            benefits, the patient was deemed in satisfactory                            condition to undergo the procedure.                           After obtaining informed consent, the colonoscope                            was passed under direct vision. Throughout the                            procedure, the patient's blood pressure, pulse, and                            oxygen saturations were monitored continuously. The                            CF-HQ190L ([id number]) Olympus coloscope was                            introduced through the anus and advanced to the the                            cecum, identified by appendiceal orifice and                            ileocecal valve. The colonoscopy was performed                            without difficulty. The patient tolerated the                            procedure well. The quality of the bowel  preparation was adequate. The ileocecal valve,                            appendiceal orifice, and rectum were photographed. Scope In: 11:35:56 AM Scope Out: 11:51:13 AM Scope Withdrawal Time: 0 hours 11 minutes 42 seconds  Total Procedure Duration: 0 hours 15 minutes 17 seconds  Findings:      The perianal and digital rectal examinations were normal.      Three post polypectomy ulcers were found in the transverse colon and in       the ascending colon. No bleeding was present, but there was stigmata of       recent bleeding present in 2 of these ulcer beds. For hemostasis, a       total of four hemostatic clips were successfully placed (MR       conditional). Clip manufacturer: [employer]. There was no       bleeding at the end of the procedure.      A tattoo was seen in the transverse colon. There was an additional       smaller, clean based ulcer located immediately distal to the tattoo,       consistent with another recent polypectomy  site. This did not require       endoscopic intervention.      The retroflexed view of the distal rectum and anal verge was normal and       showed no significant anal or rectal abnormalities.      Multiple medium-mouthed and small-mouthed diverticula were found in the       sigmoid colon. Impression:               - Three ulcers in the transverse colon and in the                            ascending colon. Clips (MR conditional) were                            placed. Clip manufacturer: [employer].                           - A tattoo was seen in the transverse colon.                           - The distal rectum and anal verge are normal on                            retroflexion view.                           - Diverticulosis in the sigmoid colon.                           - No specimens collected. Recommendation:           - Return patient to hospital ward for possible                            discharge same day.                           -  Advance diet as tolerated.                           - Continue present medications.                           - Repeat CBC in 7-10 days as outpatient to ensure                            returning to baseline                           - Can follow-up in the GI clinic as needed. Procedure Code(s):        --- Professional ---                           [id number], Colonoscopy, flexible; with control of                            bleeding, any method Diagnosis Code(s):        --- Professional ---                           K63.3, Ulcer of intestine                           K92.1, Melena (includes Hematochezia)                           D62, Acute posthemorrhagic anemia CPT copyright 2022 American Medical Association. All rights reserved. The codes documented in this report are preliminary and upon coder review may  be revised to meet current compliance requirements. Doristine Locks, MD 08/14/2023 12:09:51 PM Number of Addenda: 0

## 2023-08-14 NOTE — Transfer of Care (Signed)
Immediate Anesthesia Transfer of Care Note  Patient: Eric Suarez  Procedure(s) Performed: COLONOSCOPY WITH PROPOFOL HEMOSTASIS CLIP PLACEMENT  Patient Location: PACU  Anesthesia Type:MAC  Level of Consciousness: awake  Airway & Oxygen Therapy: Patient Spontanous Breathing  Post-op Assessment: Report given to RN and Post -op Vital signs reviewed and stable  Post vital signs: Reviewed and stable  Last Vitals:  Vitals Value Taken Time  BP 112/88 08/14/23 1200  Temp 36.2 C 08/14/23 1200  Pulse 67 08/14/23 1203  Resp 16 08/14/23 1203  SpO2 99 % 08/14/23 1203  Vitals shown include unfiled device data.  Last Pain:  Vitals:   08/14/23 1200  TempSrc:   PainSc: 0-No pain         Complications: No notable events documented.

## 2023-08-14 NOTE — Interval H&P Note (Signed)
History and Physical Interval Note:  No acute events overnight.  H/H stable.  Completed bowel prep without issue with decreasing overt blood loss through the night and into this morning.  Plan to proceed with colonoscopy today for therapeutic intent.  08/14/2023 11:08 AM  Eric Suarez  has presented today for surgery, with the diagnosis of Post polypectomy lower GI bleed.  The various methods of treatment have been discussed with the patient and family. After consideration of risks, benefits and other options for treatment, the patient has consented to  Procedure(s): COLONOSCOPY WITH PROPOFOL (N/A) as a surgical intervention.  The patient's history has been reviewed, patient examined, no change in status, stable for surgery.  I have reviewed the patient's chart and labs.  Questions were answered to the patient's satisfaction.     Eric Suarez

## 2023-08-14 NOTE — Hospital Course (Addendum)
77 y.o. male with medical history significant for renal cell cancer s/p nephrectomy, thyroid cancer s/p resection and resulting hypothroid, ckd 3a, htn, cad on cardiac CT, who presents with hematochezia. Had 5 polyps removed 10/1 during routine surveillance colonoscopoy. Began experiencing painless hematochezia t 10/2 morning, several small episodes. No abd pain, no diarrhea, no fever. No lightheadedness or palpitations. Takes asa but no other nsaids. No hx gi bleed. No toxic habits. Seen in the ED hemoglobin 11.8 g, GI was consulted and admitted overnight for observation. Overnight labs showed stable hemoglobin at 11 g, platelet at 98K, bicarb 19 with creatinine 1.4 GFR 51 S/p Colonoscopy - 3 post polyp ulcers- 2 of which had higher suspicion that there was a culprit of recent bleeding. These were all treated with clips. Had another smaller clean-based ulcer from recent polypectomy that was not suspicious for recent bleed. GI placed on full liquids and can advance his diet as tolerated, and ok for home later today.GI will coordinate to get a repeat CBC as outpatient in 7-10 days to make sure that is returning to baseline.he is eager to go home today

## 2023-08-14 NOTE — Anesthesia Postprocedure Evaluation (Signed)
Anesthesia Post Note  Patient: Eric Suarez  Procedure(s) Performed: COLONOSCOPY WITH PROPOFOL HEMOSTASIS CLIP PLACEMENT     Patient location during evaluation: Endoscopy Anesthesia Type: MAC Level of consciousness: awake and alert Pain management: pain level controlled Vital Signs Assessment: post-procedure vital signs reviewed and stable Respiratory status: spontaneous breathing, nonlabored ventilation and respiratory function stable Cardiovascular status: stable and blood pressure returned to baseline Postop Assessment: no apparent nausea or vomiting Anesthetic complications: no  No notable events documented.  Last Vitals:  Vitals:   08/14/23 1215 08/14/23 1252  BP: 125/75 132/74  Pulse: (!) 54 (!) 50  Resp: 18   Temp:  36.5 C  SpO2: 99% 100%    Last Pain:  Vitals:   08/14/23 1252  TempSrc: Oral  PainSc:                  Tashera Montalvo,W. EDMOND

## 2023-08-14 NOTE — Anesthesia Preprocedure Evaluation (Signed)
Anesthesia Evaluation  Patient identified by MRN, date of birth, ID band Patient awake    Reviewed: Allergy & Precautions, NPO status , Patient's Chart, lab work & pertinent test results  History of Anesthesia Complications (+) PONV and history of anesthetic complications  Airway Mallampati: II  TM Distance: >3 FB Neck ROM: Full    Dental  (+) Teeth Intact, Dental Advisory Given, Caps,    Pulmonary neg pulmonary ROS   Pulmonary exam normal breath sounds clear to auscultation       Cardiovascular hypertension, + CAD  Normal cardiovascular exam Rhythm:Regular Rate:Normal     Neuro/Psych  Headaches PSYCHIATRIC DISORDERS Anxiety        GI/Hepatic Neg liver ROS, hiatal hernia,GERD  ,,  Endo/Other  Hypothyroidism    Renal/GU Renal InsufficiencyRenal disease     Musculoskeletal  (+) Arthritis ,    Abdominal   Peds  Hematology negative hematology ROS (+)   Anesthesia Other Findings Day of surgery medications reviewed with the patient.  Reproductive/Obstetrics                              Anesthesia Physical Anesthesia Plan  ASA: 3  Anesthesia Plan: MAC   Post-op Pain Management:    Induction: Intravenous  PONV Risk Score and Plan: 2 and TIVA and Treatment may vary due to age or medical condition  Airway Management Planned: Natural Airway and Simple Face Mask  Additional Equipment:   Intra-op Plan:   Post-operative Plan:   Informed Consent: I have reviewed the patients History and Physical, chart, labs and discussed the procedure including the risks, benefits and alternatives for the proposed anesthesia with the patient or authorized representative who has indicated his/her understanding and acceptance.     Dental advisory given  Plan Discussed with: CRNA and Anesthesiologist  Anesthesia Plan Comments:          Anesthesia Quick Evaluation

## 2023-08-14 NOTE — Discharge Summary (Signed)
Physician Discharge Summary  Eric Suarez ZOX:[id number] DOB: [birth year] DOA: 08/13/2023  PCP: [name], MD  Admit date: 08/13/2023 Discharge date: 08/14/2023 Recommendations for Outpatient Follow-up:  Follow up with PCP in 1 weeks-call for appointment Please obtain BMP/CBC in one week  Discharge Dispo: home Discharge Condition: Stable Code Status:   Code Status: Full Code Diet recommendation:  Diet Order             Diet full liquid Fluid consistency: Thin  Diet effective now                    Brief/Interim Summary: 77 y.o. male with medical history significant for renal cell cancer s/p nephrectomy, thyroid cancer s/p resection and resulting hypothroid, ckd 3a, htn, cad on cardiac CT, who presents with hematochezia. Had 5 polyps removed 10/1 during routine surveillance colonoscopoy. Began experiencing painless hematochezia t 10/2 morning, several small episodes. No abd pain, no diarrhea, no fever. No lightheadedness or palpitations. Takes asa but no other nsaids. No hx gi bleed. No toxic habits. Seen in the ED hemoglobin 11.8 g, GI was consulted and admitted overnight for observation. Overnight labs showed stable hemoglobin at 11 g, platelet at 98K, bicarb 19 with creatinine 1.4 GFR 51 S/p Colonoscopy - 3 post polyp ulcers- 2 of which had higher suspicion that there was a culprit of recent bleeding. These were all treated with clips. Had another smaller clean-based ulcer from recent polypectomy that was not suspicious for recent bleed. GI placed on full liquids and can advance his diet as tolerated, and ok for home later today.GI will coordinate to get a repeat CBC as outpatient in 7-10 days to make sure that is returning to baseline.he is eager to go home today    Discharge Diagnoses:  Principal Problem:   Lower GI bleeding Active Problems:   CKD (chronic kidney disease) stage 3, GFR 30-59 ml/min (HCC)   Hypothyroidism   Essential hypertension   Thrombocytopenia  (HCC)   ABLA (acute blood loss anemia)   Colon ulcer   Diverticulosis of colon without hemorrhage  Post polypecomty Lower GI bleed S/p Colonoscopy - 3 post polyp ulcers- 2 of which had higher suspicion that there was a culprit of recent bleeding. These were all treated with clips. Had another smaller clean-based ulcer from recent polypectomy that was not suspicious for recent bleed. GI placed on full liquids and can advance his diet as tolerated, and ok for home later today.GI will coordinate to get a repeat CBC as outpatient in 7-10 days to make sure that is returning to baseline.he is eager to go home toda   Chronic thrombocytopenia-stable. CKD 3b:Cr 1.42 is at baseline MDD: cont home paxil Hypothyroid, postsurgical, cont home levo CAD,Seen on cardiac CT HTN-resume home atenolol/chlorthalidone WJ:XBJY home alfuzosin and dutasteride Chronic pain-cont gabapentin   Consults: GI Subjective: AAOX3 no abdomen pain, tolerating diet  Discharge Exam: Vitals:   08/14/23 1215 08/14/23 1252  BP: 125/75 132/74  Pulse: (!) 54 (!) 50  Resp: 18   Temp:  97.7 F (36.5 C)  SpO2: 99% 100%   General: Pt is alert, awake, not in acute distress Cardiovascular: RRR, S1/S2 +, no rubs, no gallops Respiratory: CTA bilaterally, no wheezing, no rhonchi Abdominal: Soft, NT, ND, bowel sounds + Extremities: no edema, no cyanosis  Discharge Instructions  Discharge Instructions     Discharge instructions   Complete by: As directed    Please call call MD or return to ER for similar or worsening  recurring problem that brought you to hospital or if any fever,nausea/vomiting,abdominal pain, uncontrolled pain, chest pain,  shortness of breath or any other alarming symptoms.  Please follow-up your doctor as instructed in a week time and call the office for appointment.  Please avoid alcohol, smoking, or any other illicit substance and maintain healthy habits including taking your regular medications as  prescribed.  You were cared for by a hospitalist during your hospital stay. If you have any questions about your discharge medications or the care you received while you were in the hospital after you are discharged, you can call the unit and ask to speak with the hospitalist on call if the hospitalist that took care of you is not available.  Once you are discharged, your primary care physician will handle any further medical issues. Please note that NO REFILLS for any discharge medications will be authorized once you are discharged, as it is imperative that you return to your primary care physician (or establish a relationship with a primary care physician if you do not have one) for your aftercare needs so that they can reassess your need for medications and monitor your lab values   Increase activity slowly   Complete by: As directed       Allergies as of 08/14/2023       Reactions   Tamsulosin Nausea And Vomiting, Other (See Comments)   Uncomfortable in pelvic region   Fish Oil Hives   Rosuvastatin Calcium Other (See Comments)   Sulfa Antibiotics Rash        Medication List     TAKE these medications    acetaminophen 325 MG tablet Commonly known as: TYLENOL Take 1-2 tablets (325-650 mg total) by mouth every 6 (six) hours as needed for mild pain (pain score 1-3 or temp > 100.5).   alfuzosin 10 MG 24 hr tablet Commonly known as: UROXATRAL Take 10 mg daily by mouth.   aspirin 81 MG chewable tablet Chew 1 tablet (81 mg total) by mouth 2 (two) times daily.   atenolol-chlorthalidone 50-25 MG tablet Commonly known as: TENORETIC Take 0.5 tablets by mouth daily.   dutasteride 0.5 MG capsule Commonly known as: AVODART Take 0.5 mg daily by mouth.   EYE VITAMINS PO Take 1 tablet by mouth daily.   ezetimibe 10 MG tablet Commonly known as: ZETIA Take 10 mg by mouth daily.   gabapentin 400 MG capsule Commonly known as: NEURONTIN Take 400 mg by mouth 2 (two) times daily.    levothyroxine 125 MCG tablet Commonly known as: SYNTHROID Take 125 mcg by mouth daily before breakfast.   nitroGLYCERIN 0.4 MG SL tablet Commonly known as: NITROSTAT Place 0.4 mg under the tongue every 5 (five) minutes as needed for chest pain.   omeprazole 20 MG capsule Commonly known as: PRILOSEC Take 20 mg by mouth at bedtime.   OVER THE COUNTER MEDICATION Take 2 tablets daily by mouth. Cognium - Brain Vitamin   PARoxetine 25 MG 24 hr tablet Commonly known as: PAXIL-CR Take 25 mg by mouth daily.   potassium chloride SA 20 MEQ tablet Commonly known as: KLOR-CON M Take 20 mEq by mouth every other day.   Vitamin C 500 MG Chew Chew 500 mg by mouth daily.   Vitamin D3 25 MCG (1000 UT) Caps Take 1,000 Units by mouth daily.   Zinc 100 MG Tabs Take 1 tablet by mouth daily.        Follow-up Information     [name], MD  Follow up in 1 week(s).   Specialty: Internal Medicine Contact information: [address] [place] [address] [phone]                Allergies  Allergen Reactions   Tamsulosin Nausea And Vomiting and Other (See Comments)    Uncomfortable in pelvic region   Fish Oil Hives   Rosuvastatin Calcium Other (See Comments)   Sulfa Antibiotics Rash    The results of significant diagnostics from this hospitalization (including imaging, microbiology, ancillary and laboratory) are listed below for reference.    Microbiology: No results found for this or any previous visit (from the past 240 hour(s)).  Procedures/Studies: DG Abd 1 View  Result Date: 08/13/2023 CLINICAL DATA:  Bright red blood in stool following colonoscopy yesterday. EXAM: ABDOMEN - 1 VIEW COMPARISON:  Abdominal radiograph dated December 26, 2018. FINDINGS: The bowel gas pattern is normal. No free air is identified. No radio-opaque calculi or other significant radiographic abnormality are seen. IMPRESSION: Normal bowel-gas pattern.  No free air identified.  Electronically Signed   By: Hart Robinsons M.D.   On: 08/13/2023 14:25    Labs: BNP (last 3 results) No results for input(s): "BNP" in the last 8760 hours. Basic Metabolic Panel: Recent Labs  Lab 08/13/23 0909 08/14/23 0631  NA 137 138  K 3.3* 3.5  CL 107 104  CO2 18* 19*  GLUCOSE 143* 111*  BUN 15 13  CREATININE 1.42* 1.42*  CALCIUM 8.5* 8.2*   Liver Function Tests: Recent Labs  Lab 08/13/23 0909  AST 19  ALT 18  ALKPHOS 88  BILITOT 0.9  PROT 6.2*  ALBUMIN 3.9   No results for input(s): "LIPASE", "AMYLASE" in the last 168 hours. No results for input(s): "AMMONIA" in the last 168 hours. CBC: Recent Labs  Lab 08/13/23 0909 08/13/23 1653 08/14/23 0631  WBC 7.6 6.4 4.8  NEUTROABS  --  4.3  --   HGB 11.8* 10.8* 11.0*  HCT 37.0* 32.4* 34.0*  MCV 90.5 86.9 88.1  PLT 108* 96* 98*   No results for input(s): "VITAMINB12", "FOLATE", "FERRITIN", "TIBC", "IRON", "RETICCTPCT" in the last 72 hours. Urinalysis    Component Value Date/Time   COLORURINE YELLOW 11/05/2022 1001   APPEARANCEUR CLEAR 11/05/2022 1001   LABSPEC 1.019 11/05/2022 1001   PHURINE 5.0 11/05/2022 1001   GLUCOSEU NEGATIVE 11/05/2022 1001   HGBUR NEGATIVE 11/05/2022 1001   BILIRUBINUR NEGATIVE 11/05/2022 1001   KETONESUR NEGATIVE 11/05/2022 1001   PROTEINUR NEGATIVE 11/05/2022 1001   NITRITE NEGATIVE 11/05/2022 1001   LEUKOCYTESUR TRACE (A) 11/05/2022 1001   Sepsis Labs Recent Labs  Lab 08/13/23 0909 08/13/23 1653 08/14/23 0631  WBC 7.6 6.4 4.8   Microbiology No results found for this or any previous visit (from the past 240 hour(s)).   Time coordinating discharge: 25 minutes  SIGNED: Lanae Boast, MD  [facility] 08/14/2023, 1:10 PM  If 7PM-7AM, please contact night-coverage [email or web address]

## 2023-08-15 ENCOUNTER — Telehealth: Payer: Self-pay | Admitting: Gastroenterology

## 2023-08-15 ENCOUNTER — Encounter: Payer: Self-pay | Admitting: Gastroenterology

## 2023-08-15 LAB — SURGICAL PATHOLOGY

## 2023-08-15 LAB — HCV INTERPRETATION

## 2023-08-15 LAB — HCV AB W REFLEX TO QUANT PCR: HCV Ab: NONREACTIVE

## 2023-08-15 NOTE — Telephone Encounter (Signed)
Eric Suarez,  Disregard previous staff message regarding need for posthospital phone call and arrangement of CBC for this patient who was hospitalized with a post polypectomy bleed.  I spoke to [name] by phone today and he has returned home uneventfully after the hospital stay.  He feels well, has no further bleeding, and says that discharge planners arranged for him to have his CBC checked with his PCP Dr. Renne Crigler sometime next week.  I have just asked [name] to make sure the Dr. Carolee Rota office fax [place] a copy of that result to [place].  His pathology results have not yet resulted, but we will send him a letter about that when they are available.  Ellwood Dense, MD

## 2023-08-15 NOTE — Telephone Encounter (Signed)
Noted  

## 2023-08-18 ENCOUNTER — Encounter (HOSPITAL_COMMUNITY): Payer: Self-pay | Admitting: Gastroenterology

## 2023-08-19 ENCOUNTER — Telehealth: Payer: Self-pay | Admitting: Gastroenterology

## 2023-08-19 DIAGNOSIS — K921 Melena: Secondary | ICD-10-CM

## 2023-08-19 NOTE — Telephone Encounter (Signed)
Inbound call from patients wife stating that patient had procedure with Dr. Myrtie Neither on 10/1 and then was taken to the hospital a day after and had another procedure done at [facility] with Dr. Barron Alvine. Patient stated Dr. Myrtie Neither wanted patient to have labs done to check to make sure he was still doing well. Patients wife is requesting to have orders placed at our lab and have the reports sent to patients PCP. Please advise.

## 2023-08-19 NOTE — Telephone Encounter (Signed)
Patient's wife calls today asking to have patient CBC completed at our office. She states that they do not have an appointment with Dr Renne Crigler, PCP until mid-November and Dr Myrtie Neither wanted labs earlier (see 08/15/23 telephone phone note from Dr Myrtie Neither). Patient did not previously realize that his appointment with PCP was not until November. CBC order has been placed under Dr Myrtie Neither and patient will need to come on 10/10 or 10/11 for the lab.

## 2023-08-21 ENCOUNTER — Other Ambulatory Visit (INDEPENDENT_AMBULATORY_CARE_PROVIDER_SITE_OTHER): Payer: PPO

## 2023-08-21 DIAGNOSIS — K921 Melena: Secondary | ICD-10-CM

## 2023-08-21 LAB — CBC WITH DIFFERENTIAL/PLATELET
Basophils Absolute: 0 10*3/uL (ref 0.0–0.1)
Basophils Relative: 0.3 % (ref 0.0–3.0)
Eosinophils Absolute: 0 10*3/uL (ref 0.0–0.7)
Eosinophils Relative: 0.5 % (ref 0.0–5.0)
HCT: 33.4 % — ABNORMAL LOW (ref 39.0–52.0)
Hemoglobin: 10.9 g/dL — ABNORMAL LOW (ref 13.0–17.0)
Lymphocytes Relative: 31 % (ref 12.0–46.0)
Lymphs Abs: 1.6 10*3/uL (ref 0.7–4.0)
MCHC: 32.5 g/dL (ref 30.0–36.0)
MCV: 87.8 fL (ref 78.0–100.0)
Monocytes Absolute: 0.4 10*3/uL (ref 0.1–1.0)
Monocytes Relative: 7.1 % (ref 3.0–12.0)
Neutro Abs: 3.1 10*3/uL (ref 1.4–7.7)
Neutrophils Relative %: 61.1 % (ref 43.0–77.0)
Platelets: 92 10*3/uL — ABNORMAL LOW (ref 150.0–400.0)
RBC: 3.8 Mil/uL — ABNORMAL LOW (ref 4.22–5.81)
RDW: 14.4 % (ref 11.5–15.5)
WBC: 5.1 10*3/uL (ref 4.0–10.5)

## 2023-09-11 DIAGNOSIS — K4011 Bilateral inguinal hernia, with gangrene, recurrent: Secondary | ICD-10-CM | POA: Diagnosis not present

## 2023-10-08 DIAGNOSIS — E032 Hypothyroidism due to medicaments and other exogenous substances: Secondary | ICD-10-CM | POA: Diagnosis not present

## 2023-10-08 DIAGNOSIS — R7303 Prediabetes: Secondary | ICD-10-CM | POA: Diagnosis not present

## 2023-10-08 DIAGNOSIS — E7800 Pure hypercholesterolemia, unspecified: Secondary | ICD-10-CM | POA: Diagnosis not present

## 2023-10-08 DIAGNOSIS — I10 Essential (primary) hypertension: Secondary | ICD-10-CM | POA: Diagnosis not present

## 2023-10-08 DIAGNOSIS — Z125 Encounter for screening for malignant neoplasm of prostate: Secondary | ICD-10-CM | POA: Diagnosis not present

## 2023-10-14 DIAGNOSIS — N400 Enlarged prostate without lower urinary tract symptoms: Secondary | ICD-10-CM | POA: Diagnosis not present

## 2023-10-14 DIAGNOSIS — I2510 Atherosclerotic heart disease of native coronary artery without angina pectoris: Secondary | ICD-10-CM | POA: Diagnosis not present

## 2023-10-14 DIAGNOSIS — Z0000 Encounter for general adult medical examination without abnormal findings: Secondary | ICD-10-CM | POA: Diagnosis not present

## 2023-10-14 DIAGNOSIS — E032 Hypothyroidism due to medicaments and other exogenous substances: Secondary | ICD-10-CM | POA: Diagnosis not present

## 2023-10-14 DIAGNOSIS — I10 Essential (primary) hypertension: Secondary | ICD-10-CM | POA: Diagnosis not present

## 2023-10-14 DIAGNOSIS — K219 Gastro-esophageal reflux disease without esophagitis: Secondary | ICD-10-CM | POA: Diagnosis not present

## 2023-10-14 DIAGNOSIS — D692 Other nonthrombocytopenic purpura: Secondary | ICD-10-CM | POA: Diagnosis not present

## 2023-10-14 DIAGNOSIS — N1831 Chronic kidney disease, stage 3a: Secondary | ICD-10-CM | POA: Diagnosis not present

## 2023-10-14 DIAGNOSIS — D696 Thrombocytopenia, unspecified: Secondary | ICD-10-CM | POA: Diagnosis not present

## 2023-10-14 DIAGNOSIS — R7303 Prediabetes: Secondary | ICD-10-CM | POA: Diagnosis not present

## 2023-10-14 DIAGNOSIS — M2550 Pain in unspecified joint: Secondary | ICD-10-CM | POA: Diagnosis not present

## 2023-10-14 DIAGNOSIS — N210 Calculus in bladder: Secondary | ICD-10-CM | POA: Diagnosis not present

## 2023-10-16 DIAGNOSIS — N401 Enlarged prostate with lower urinary tract symptoms: Secondary | ICD-10-CM | POA: Diagnosis not present

## 2023-10-17 ENCOUNTER — Telehealth: Payer: Self-pay | Admitting: Oncology

## 2023-10-17 NOTE — Telephone Encounter (Signed)
10/17/23 Spoke with patient and scheduled New Hem appt with Dr Angelene Giovanni.

## 2023-10-23 DIAGNOSIS — R3912 Poor urinary stream: Secondary | ICD-10-CM | POA: Diagnosis not present

## 2023-10-23 DIAGNOSIS — N401 Enlarged prostate with lower urinary tract symptoms: Secondary | ICD-10-CM | POA: Diagnosis not present

## 2023-10-23 DIAGNOSIS — N210 Calculus in bladder: Secondary | ICD-10-CM | POA: Diagnosis not present

## 2023-10-23 DIAGNOSIS — R972 Elevated prostate specific antigen [PSA]: Secondary | ICD-10-CM | POA: Diagnosis not present

## 2023-10-31 ENCOUNTER — Inpatient Hospital Stay: Payer: PPO

## 2023-10-31 ENCOUNTER — Encounter: Payer: Self-pay | Admitting: Oncology

## 2023-10-31 ENCOUNTER — Inpatient Hospital Stay: Payer: PPO | Attending: Oncology | Admitting: Oncology

## 2023-10-31 VITALS — BP 110/70 | HR 60 | Temp 97.5°F | Resp 16 | Ht 71.7 in | Wt 205.7 lb

## 2023-10-31 DIAGNOSIS — Z8719 Personal history of other diseases of the digestive system: Secondary | ICD-10-CM | POA: Insufficient documentation

## 2023-10-31 DIAGNOSIS — Z905 Acquired absence of kidney: Secondary | ICD-10-CM | POA: Diagnosis not present

## 2023-10-31 DIAGNOSIS — N400 Enlarged prostate without lower urinary tract symptoms: Secondary | ICD-10-CM | POA: Diagnosis not present

## 2023-10-31 DIAGNOSIS — D122 Benign neoplasm of ascending colon: Secondary | ICD-10-CM | POA: Diagnosis not present

## 2023-10-31 DIAGNOSIS — Z8249 Family history of ischemic heart disease and other diseases of the circulatory system: Secondary | ICD-10-CM

## 2023-10-31 DIAGNOSIS — Z85850 Personal history of malignant neoplasm of thyroid: Secondary | ICD-10-CM | POA: Diagnosis not present

## 2023-10-31 DIAGNOSIS — Z882 Allergy status to sulfonamides status: Secondary | ICD-10-CM | POA: Diagnosis not present

## 2023-10-31 DIAGNOSIS — I130 Hypertensive heart and chronic kidney disease with heart failure and stage 1 through stage 4 chronic kidney disease, or unspecified chronic kidney disease: Secondary | ICD-10-CM | POA: Diagnosis not present

## 2023-10-31 DIAGNOSIS — D49512 Neoplasm of unspecified behavior of left kidney: Secondary | ICD-10-CM | POA: Diagnosis not present

## 2023-10-31 DIAGNOSIS — E039 Hypothyroidism, unspecified: Secondary | ICD-10-CM | POA: Diagnosis not present

## 2023-10-31 DIAGNOSIS — Z85528 Personal history of other malignant neoplasm of kidney: Secondary | ICD-10-CM

## 2023-10-31 DIAGNOSIS — D696 Thrombocytopenia, unspecified: Secondary | ICD-10-CM

## 2023-10-31 DIAGNOSIS — Z8349 Family history of other endocrine, nutritional and metabolic diseases: Secondary | ICD-10-CM | POA: Insufficient documentation

## 2023-10-31 DIAGNOSIS — K5730 Diverticulosis of large intestine without perforation or abscess without bleeding: Secondary | ICD-10-CM | POA: Diagnosis not present

## 2023-10-31 DIAGNOSIS — D123 Benign neoplasm of transverse colon: Secondary | ICD-10-CM | POA: Diagnosis not present

## 2023-10-31 DIAGNOSIS — N183 Chronic kidney disease, stage 3 unspecified: Secondary | ICD-10-CM

## 2023-10-31 DIAGNOSIS — Z9049 Acquired absence of other specified parts of digestive tract: Secondary | ICD-10-CM | POA: Diagnosis not present

## 2023-10-31 DIAGNOSIS — Z818 Family history of other mental and behavioral disorders: Secondary | ICD-10-CM

## 2023-10-31 DIAGNOSIS — Z83438 Family history of other disorder of lipoprotein metabolism and other lipidemia: Secondary | ICD-10-CM

## 2023-10-31 DIAGNOSIS — Z79899 Other long term (current) drug therapy: Secondary | ICD-10-CM | POA: Diagnosis not present

## 2023-10-31 DIAGNOSIS — I10 Essential (primary) hypertension: Secondary | ICD-10-CM | POA: Diagnosis not present

## 2023-10-31 DIAGNOSIS — I2510 Atherosclerotic heart disease of native coronary artery without angina pectoris: Secondary | ICD-10-CM | POA: Diagnosis not present

## 2023-10-31 DIAGNOSIS — M3214 Glomerular disease in systemic lupus erythematosus: Secondary | ICD-10-CM | POA: Insufficient documentation

## 2023-10-31 DIAGNOSIS — E782 Mixed hyperlipidemia: Secondary | ICD-10-CM | POA: Insufficient documentation

## 2023-10-31 LAB — TECHNOLOGIST SMEAR REVIEW

## 2023-10-31 LAB — HIV ANTIBODY (ROUTINE TESTING W REFLEX): HIV Screen 4th Generation wRfx: NONREACTIVE

## 2023-10-31 LAB — CBC WITH DIFFERENTIAL/PLATELET
Abs Immature Granulocytes: 0.02 10*3/uL (ref 0.00–0.07)
Basophils Absolute: 0 10*3/uL (ref 0.0–0.1)
Basophils Relative: 0 %
Eosinophils Absolute: 0 10*3/uL (ref 0.0–0.5)
Eosinophils Relative: 1 %
HCT: 36.2 % — ABNORMAL LOW (ref 39.0–52.0)
Hemoglobin: 12.1 g/dL — ABNORMAL LOW (ref 13.0–17.0)
Immature Granulocytes: 1 %
Immature Platelet Fraction: 4.7 % (ref 1.2–8.6)
Lymphocytes Relative: 21 %
Lymphs Abs: 0.9 10*3/uL (ref 0.7–4.0)
MCH: 29.4 pg (ref 26.0–34.0)
MCHC: 33.4 g/dL (ref 30.0–36.0)
MCV: 87.9 fL (ref 80.0–100.0)
Monocytes Absolute: 0.3 10*3/uL (ref 0.1–1.0)
Monocytes Relative: 7 %
Neutro Abs: 3 10*3/uL (ref 1.7–7.7)
Neutrophils Relative %: 70 %
Platelets: 77 10*3/uL — ABNORMAL LOW (ref 150–400)
RBC: 4.12 MIL/uL — ABNORMAL LOW (ref 4.22–5.81)
RDW: 16.4 % — ABNORMAL HIGH (ref 11.5–15.5)
WBC: 4.3 10*3/uL (ref 4.0–10.5)
nRBC: 0 % (ref 0.0–0.2)
nRBC: 0 /100{WBCs}

## 2023-10-31 LAB — COMPREHENSIVE METABOLIC PANEL
ALT: 16 U/L (ref 0–44)
AST: 19 U/L (ref 15–41)
Albumin: 4.2 g/dL (ref 3.5–5.0)
Alkaline Phosphatase: 82 U/L (ref 38–126)
Anion gap: 12 (ref 5–15)
BUN: 17 mg/dL (ref 8–23)
CO2: 22 mmol/L (ref 22–32)
Calcium: 9 mg/dL (ref 8.9–10.3)
Chloride: 106 mmol/L (ref 98–111)
Creatinine, Ser: 1.4 mg/dL — ABNORMAL HIGH (ref 0.61–1.24)
GFR, Estimated: 51 mL/min — ABNORMAL LOW (ref >60)
Glucose, Bld: 101 mg/dL — ABNORMAL HIGH (ref 70–99)
Potassium: 4.4 mmol/L (ref 3.5–5.1)
Sodium: 141 mmol/L (ref 135–145)
Total Bilirubin: 0.7 mg/dL (ref <1.2)
Total Protein: 6.2 g/dL — ABNORMAL LOW (ref 6.5–8.1)

## 2023-10-31 LAB — HEPATITIS PANEL, ACUTE
HCV Ab: NONREACTIVE
Hep A IgM: NONREACTIVE
Hep B C IgM: NONREACTIVE
Hepatitis B Surface Ag: NONREACTIVE

## 2023-10-31 LAB — FIBRINOGEN: Fibrinogen: 307 mg/dL (ref 210–475)

## 2023-10-31 LAB — VITAMIN B12: Vitamin B-12: 814 pg/mL (ref 180–914)

## 2023-10-31 LAB — APTT: aPTT: 38 s — ABNORMAL HIGH (ref 24–36)

## 2023-10-31 LAB — PROTIME-INR
INR: 1.1 (ref 0.8–1.2)
Prothrombin Time: 14.2 s (ref 11.4–15.2)

## 2023-10-31 LAB — DIRECT ANTIGLOBULIN TEST (NOT AT ARMC)
DAT, IgG: NEGATIVE
DAT, complement: NEGATIVE

## 2023-10-31 LAB — FOLATE: Folate: 39.5 ng/mL (ref >5.9)

## 2023-10-31 LAB — D-DIMER, QUANTITATIVE: D-Dimer, Quant: 1.02 ug{FEU}/mL — ABNORMAL HIGH (ref 0.00–0.50)

## 2023-10-31 NOTE — Progress Notes (Signed)
[facility] Center Cancer Initial Visit:  Patient Care Team: [name], MD as PCP - General (Internal Medicine) [name], MD as PCP - Cardiology (Cardiology) Janalyn Harder, MD (Inactive) as Consulting Physician (Dermatology)  CHIEF COMPLAINTS/PURPOSE OF CONSULTATION:  HISTORY OF PRESENTING ILLNESS: Eric Suarez 77 y.o. male is here because of  thrombocytopenia Medical history notable for hypothyroidism, hypertension, BPH, CKD stage III, coronary artery disease, thyroid cancer, renal cell carcinoma left kidney treated with nephrectomy, lung nodules  August 12 2023:  Colonoscopy--  Tattoo in mid transverse colon. Post-polypectomy scar at the tattoo site. Three 5 to 10 mm polyps in the ascending colon, removed.  Two 12 mm polyps in the transverse colon, removed.  Diverticulosis in the left colon.  Pathology - Tubular adenoma x 3 and inflammatory polyps  August 13 2023 through August 14 2023- Admission to [facility] for observation due to post procedural bleeding.    October 08, 2023: WBC 5.0 hemoglobin 12.7 platelet count 86; 69 segs 24 lymphs 6 monos 1 EO Of note platelet count in November 2023 was 109 and in November 2022 platelet count was 178  October 31 2023:  [facility] Hematology Consult The kidney cancer and thyroid cancer were both treated with resection.  No history of XRT or chemotherapy.  No history of blood transfusion.   Increased bruising since starting ASA 81 mg   Social:  Married.  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  Tobacco none.  EtOH none  FMH Mother died 60 dementia Father died 74 MI Brother died 31 MI   Review of Systems  Constitutional:  Negative for appetite change, chills, fatigue, fever and unexpected weight change.  HENT:   Negative for hearing loss, mouth sores, nosebleeds and sore throat.        No gingival bleeding  Eyes:  Negative for eye problems and icterus.       Vision  changes:  None  Respiratory:  Negative for chest tightness, cough, hemoptysis and shortness of breath.        PND:  none Orthopnea:  none DOE:    Cardiovascular:  Negative for chest pain, leg swelling and palpitations.       PND:  none Orthopnea:  none  Gastrointestinal:  Negative for abdominal pain, blood in stool, constipation, nausea and vomiting.       Has experienced some diarrhea helped by fiber  Endocrine: Negative for hot flashes.       Cold intolerance:  none Heat intolerance:  none  Genitourinary:  Negative for bladder incontinence, difficulty urinating, dysuria, frequency, hematuria and nocturia.   Musculoskeletal:  Negative for myalgias, neck pain and neck stiffness.       Arthralgias of hands, back and knee  Skin:  Negative for itching, rash and wound.  Neurological:  Negative for dizziness, headaches, light-headedness, seizures and speech difficulty.       Numbness of fingers.  Larey Seat a few times during the last year due to a foot drop  Hematological:  Negative for adenopathy. Bruises/bleeds easily.  Psychiatric/Behavioral:  Negative for suicidal ideas. The patient is not nervous/anxious.        Sleep sometimes affected by pain in hands    MEDICAL HISTORY: Past Medical History:  Diagnosis Date   Anxiety    Arthritis    BPH (benign prostatic hyperplasia)    Cataracts, bilateral    Chronic back pain    Followed by Dr. Lovell Sheehan   CKD (  chronic kidney disease) stage 3, GFR 30-59 ml/min (HCC) 09/2021   Creatinine 1.47   Complication of anesthesia    unable to void after surgery   Coronary artery disease 10/31/2021   Coronary Calcium Score 535   GERD (gastroesophageal reflux disease)    History of hiatal hernia    History of kidney surgery    left kidney removed due to cancer 2017   History of thyroid cancer 1998   Thyroidectomy-now on Synthroid   Hyperlipidemia    Hypertension    Hypothyroidism (acquired) 1998   Post thyroidectomy for thyroid cancer   Macular  degeneration of both eyes    dry   Mixed dyslipidemia 09/27/2021   Low HDL, high triglycerides with normal LDL: TC 117, TG 135, HDL 25, LDL 56.   Nocturia    Occipital neuralgia    PONV (postoperative nausea and vomiting)    Renal cell carcinoma, left (HCC) 2017   Left nephrectomy-Dr. Billey Chang, complicated by multiple different hernia surgeries.   Renal cyst     SURGICAL HISTORY: Past Surgical History:  Procedure Laterality Date   APPENDECTOMY     COLONOSCOPY     COLONOSCOPY WITH PROPOFOL N/A 08/14/2023   Procedure: COLONOSCOPY WITH PROPOFOL;  Surgeon: Shellia Cleverly, DO;  Location: [facility];  Service: Gastroenterology;  Laterality: N/A;   CYSTOSCOPY WITH RETROGRADE PYELOGRAM, URETEROSCOPY AND STENT PLACEMENT Left 04/01/2016   Procedure: CYSTOSCOPY WITH RETROGRADE PYELOGRAM, left ureter;  Surgeon: Heloise Purpura, MD;  Location: [facility];  Service: Urology;  Laterality: Left;   HEMORRHOID SURGERY     HEMOSTASIS CLIP PLACEMENT  08/14/2023   Procedure: HEMOSTASIS CLIP PLACEMENT;  Surgeon: Shellia Cleverly, DO;  Location: [facility];  Service: Gastroenterology;;   HERNIA REPAIR     inguinal-3 on left , 1 on right   INCISIONAL HERNIA REPAIR N/A 12/23/2016   Procedure: LAPAROSCOPIC VERSUS OPEN LYSIS OF ADHESIONS AND INCISIONAL HERNIA REPAIR WITH MESH;  Surgeon: Axel Filler, MD;  Location: [facility];  Service: General;  Laterality: N/A;   INCISIONAL HERNIA REPAIR  10/08/2017   OPEN   INCISIONAL HERNIA REPAIR N/A 10/08/2017   Procedure: OPEN HERNIA REPAIR INCISIONAL ERAS PATHWAY;  Surgeon: Axel Filler, MD;  Location: [facility];  Service: General;  Laterality: N/A;   INSERTION OF MESH N/A 12/23/2016   Procedure: INSERTION OF MESH;  Surgeon: Axel Filler, MD;  Location: [facility];  Service: General;  Laterality: N/A;   INSERTION OF MESH N/A 01/10/2017   Procedure: INSERTION OF MESH;  Surgeon: Axel Filler, MD;  Location: [facility];  Service: General;  Laterality: N/A;   INSERTION OF  MESH N/A 10/08/2017   Procedure: INSERTION OF MESH;  Surgeon: Axel Filler, MD;  [facility];  Service: General;  Laterality: N/A;   LAPAROSCOPIC NEPHRECTOMY Left 04/18/2016   Procedure: LAPAROSCOPIC RADICAL  LEFT NEPHRECTOMY;  Surgeon: Heloise Purpura, MD;  Location: [facility];  Service: Urology;  Laterality: Left;   LAPAROSCOPY N/A 01/10/2017   Procedure: LAPAROSCOPY DIAGNOSTIC , POSSIBLE LAPAROTOMY;  Surgeon: Axel Filler, MD;  Location: [facility];  Service: General;  Laterality: N/A;   NASAL SINUS SURGERY     left side   NASAL SINUS SURGERY Left 10/25/2019   Procedure: ENDOSCOPIC SINUS SURGERY/ethmoid and maxillary;  Surgeon: Serena Colonel, MD;  Location: [facility];  Service: ENT;  Laterality: Left;   THYROIDECTOMY, PARTIAL  1998   left small amount on left side-for cancer   TONSILLECTOMY     TOTAL KNEE ARTHROPLASTY Right 11/19/2022  Procedure: RIGHT TOTAL KNEE ARTHROPLASTY;  Surgeon: Cammy Copa, MD;  Location: [facility] OR;  Service: Orthopedics;  Laterality: Right;    SOCIAL HISTORY: Social History   Socioeconomic History   Marital status: Married    Spouse name: Not on file   Number of children: 1   Years of education: Not on file   Highest education level: Not on file  Occupational History   Occupation: retired  Tobacco Use   Smoking status: Never   Smokeless tobacco: Never  Vaping Use   Vaping status: Never Used  Substance and Sexual Activity   Alcohol use: No   Drug use: No   Sexual activity: Not on file  Other Topics Concern   Not on file  Social History Narrative   Married father 1 son who lives in [place].  He lives with his wife.      Drink 2 cups of coffee a day.      Previously used to exercise quite a bit with significant daily walking and mowing multiple lawns.  (His house, along with several neighbors and the church lawn) now he does still do some yard work and occasional walking.  But since the onset of COVID, has slowly  declined in and out of exercise.   Social Drivers of Corporate investment banker Strain: Not on file  Food Insecurity: No Food Insecurity (08/13/2023)   Hunger Vital Sign    Worried About Running Out of Food in the Last Year: Never true    Ran Out of Food in the Last Year: Never true  Transportation Needs: No Transportation Needs (08/13/2023)   PRAPARE - Administrator, Civil Service (Medical): No    Lack of Transportation (Non-Medical): No  Physical Activity: Not on file  Stress: Not on file  Social Connections: Not on file  Intimate Partner Violence: Not At Risk (08/13/2023)   Humiliation, Afraid, Rape, and Kick questionnaire    Fear of Current or Ex-Partner: No    Emotionally Abused: No    Physically Abused: No    Sexually Abused: No    FAMILY HISTORY Family History  Problem Relation Age of Onset   Dementia Mother    Coronary artery disease Father 21       Longstanding smoker   Heart attack Father 58       Second MI was at 67   Hypertension Father    Hyperlipidemia Father    Heart attack Brother 28       Sudden cardiac death from MI   Healthy Son    Liver disease Neg Hx    Esophageal cancer Neg Hx    Colon cancer Neg Hx     ALLERGIES:  is allergic to tamsulosin, fish oil, rosuvastatin calcium, and sulfa antibiotics.  MEDICATIONS:  Current Outpatient Medications  Medication Sig Dispense Refill   acetaminophen (TYLENOL) 325 MG tablet Take 1-2 tablets (325-650 mg total) by mouth every 6 (six) hours as needed for mild pain (pain score 1-3 or temp > 100.5). 40 tablet 0   alfuzosin (UROXATRAL) 10 MG 24 hr tablet Take 10 mg daily by mouth.   11   Ascorbic Acid (VITAMIN C) 500 MG CHEW Chew 500 mg by mouth daily.     aspirin 81 MG chewable tablet Chew 1 tablet (81 mg total) by mouth 2 (two) times daily. 42 tablet 0   atenolol-chlorthalidone (TENORETIC) 50-25 MG tablet Take 0.5 tablets by mouth daily.     Cholecalciferol (VITAMIN  D3) 25 MCG (1000 UT) CAPS Take  1,000 Units by mouth daily.     dutasteride (AVODART) 0.5 MG capsule Take 0.5 mg daily by mouth.     ezetimibe (ZETIA) 10 MG tablet Take 10 mg by mouth daily.     gabapentin (NEURONTIN) 400 MG capsule Take 400 mg by mouth 2 (two) times daily.     levothyroxine (SYNTHROID) 125 MCG tablet Take 125 mcg by mouth daily before breakfast.     Multiple Vitamins-Minerals (EYE VITAMINS PO) Take 1 tablet by mouth daily.     nitroGLYCERIN (NITROSTAT) 0.4 MG SL tablet Place 0.4 mg under the tongue every 5 (five) minutes as needed for chest pain.     omeprazole (PRILOSEC) 20 MG capsule Take 20 mg by mouth at bedtime.     PARoxetine (PAXIL-CR) 25 MG 24 hr tablet Take 25 mg by mouth daily.  3   potassium chloride SA (K-DUR,KLOR-CON) 20 MEQ tablet Take 20 mEq by mouth every other day.     Zinc 100 MG TABS Take 1 tablet by mouth daily.     No current facility-administered medications for this visit.    PHYSICAL EXAMINATION:  ECOG PERFORMANCE STATUS: 0 - Asymptomatic   Vitals:   10/31/23 1103  BP: 110/70  Pulse: 60  Resp: 16  Temp: (!) 97.5 F (36.4 C)  SpO2: 99%    Filed Weights   10/31/23 1103  Weight: 205 lb 11.2 oz (93.3 kg)     Physical Exam Vitals and nursing note reviewed.  Constitutional:      General: He is not in acute distress.    Appearance: Normal appearance. He is normal weight. He is not ill-appearing, toxic-appearing or diaphoretic.     Comments: Here with wife  HENT:     Head: Normocephalic and atraumatic.     Right Ear: External ear normal.     Left Ear: External ear normal.     Nose: Nose normal.  Eyes:     Conjunctiva/sclera: Conjunctivae normal.     Pupils: Pupils are equal, round, and reactive to light.  Cardiovascular:     Rate and Rhythm: Normal rate and regular rhythm.     Heart sounds:     No friction rub. No gallop.  Pulmonary:     Effort: Pulmonary effort is normal. No respiratory distress.     Breath sounds: Normal breath sounds. No wheezing or  rhonchi.  Abdominal:     Palpations: Abdomen is soft.     Tenderness: There is no abdominal tenderness. There is no guarding or rebound.     Hernia: No hernia is present.  Musculoskeletal:     Cervical back: Normal range of motion and neck supple. No rigidity or tenderness.     Right lower leg: No edema.     Left lower leg: No edema.     Comments: Arthritic changes of hands  Lymphadenopathy:     Head:     Right side of head: No submental, submandibular, tonsillar, preauricular, posterior auricular or occipital adenopathy.     Left side of head: No submental, submandibular, tonsillar, preauricular, posterior auricular or occipital adenopathy.     Cervical: No cervical adenopathy.     Right cervical: No superficial, deep or posterior cervical adenopathy.    Left cervical: No superficial, deep or posterior cervical adenopathy.     Upper Body:     Right upper body: No supraclavicular or axillary adenopathy.     Left upper body: No supraclavicular or axillary  adenopathy.     Lower Body: No right inguinal adenopathy. No left inguinal adenopathy.  Skin:    General: Skin is warm and dry.     Coloration: Skin is not jaundiced or pale.     Findings: Bruising present.  Neurological:     General: No focal deficit present.     Mental Status: He is alert and oriented to person, place, and time.     Cranial Nerves: No cranial nerve deficit.  Psychiatric:        Mood and Affect: Mood normal.        Behavior: Behavior normal.        Thought Content: Thought content normal.        Judgment: Judgment normal.      LABORATORY DATA: I have personally reviewed the data as listed:  Appointment on 10/31/2023  Component Date Value Ref Range Status   WBC MORPHOLOGY 10/31/2023 MORPHOLOGY UNREMARKABLE   Final   RBC MORPHOLOGY 10/31/2023 MORPHOLOGY UNREMARKABLE   Final   Plt Morphology 10/31/2023 LARGE PLATELETS NOTED ON SMEAR   Final   PLATELETS APPEAR DECREASED   Clinical Information 10/31/2023  Thrombocytopenia   Final   Performed at [facility] at [facility], [address] [place], [place], [address]   WBC 10/31/2023 4.3  4.0 - 10.5 K/uL Final   RBC 10/31/2023 4.12 (L)  4.22 - 5.81 MIL/uL Final   Hemoglobin 10/31/2023 12.1 (L)  13.0 - 17.0 g/dL Final   HCT 72/53/6644 36.2 (L)  39.0 - 52.0 % Final   MCV 10/31/2023 87.9  80.0 - 100.0 fL Final   MCH 10/31/2023 29.4  26.0 - 34.0 pg Final   MCHC 10/31/2023 33.4  30.0 - 36.0 g/dL Final   RDW 03/47/4259 16.4 (H)  11.5 - 15.5 % Final   Platelets 10/31/2023 77 (L)  150 - 400 K/uL Final   Comment: REPEATED TO VERIFY CONSISTENT WITH PREVIOUS RESULT    nRBC 10/31/2023 0.00  0.0 - 0.2 % Final   Neutrophils Relative % 10/31/2023 70  % Final   Neutro Abs 10/31/2023 3.0  1.7 - 7.7 K/uL Final   Lymphocytes Relative 10/31/2023 21  % Final   Lymphs Abs 10/31/2023 0.9  0.7 - 4.0 K/uL Final   Monocytes Relative 10/31/2023 7  % Final   Monocytes Absolute 10/31/2023 0.3  0.1 - 1.0 K/uL Final   Eosinophils Relative 10/31/2023 1  % Final   Eosinophils Absolute 10/31/2023 0.0  0.0 - 0.5 K/uL Final   Basophils Relative 10/31/2023 0  % Final   Basophils Absolute 10/31/2023 0.0  0.0 - 0.1 K/uL Final   Immature Granulocytes 10/31/2023 1  % Final   Abs Immature Granulocytes 10/31/2023 0.02  0.00 - 0.07 K/uL Final   nRBC 10/31/2023 0  0 /100 WBC Final   Immature Platelet Fraction 10/31/2023 4.7  1.2 - 8.6 % Final   Performed at [facility], [address] [place], [place], [address]    RADIOGRAPHIC STUDIES: I have personally reviewed the radiological images as listed and agree with the findings in the report  No results found.  ASSESSMENT/PLAN  77 y.o. male with thrombocytopenia.  Medical history notable for hypothyroidism, hypertension, BPH, CKD stage III, coronary artery disease, thyroid cancer, renal cell carcinoma left kidney treated with nephrectomy, lung nodules  Thrombocytopenia-  October 31 2023-  Patient has moderate thrombocytopenia which has evolved over the last year.  He has not experienced major bleeding complications but  has noted increased bruising in setting of antiplatelet therapy with ASA 81 mg daily  Possible causes in this patient  Decreased Production      Bone marrow failure  Aplastic anemia PNH, Schwachman-Diamond Syndrome    Bone marrow malignancies AML Myelodysplasia Myeloproliferative disorders Multiple myeloma PNH    Bone marrow suppression  Drug induced Acetaminophen,  Chlorthalidone     Congenital Macrothrombocytopenias Alport syndrome Bernard-Soulier Syndrome Fanconi anemia Platelet type or pseudo von-Willebrand disease Wiskott-Aldrich Syndrome Bernard-Soulier syndrome May-Hegglin anomaly       Infection  Bacterial Leptospirosis, brucellosis, anaplasmosis  Viral  Mycobacterial Tuberculosis MAC      Neoplastic marrow infiltration Lymphoid malignancies Solid tumors      Nutritional Deficiencies Vitamin B12, folic acid copper        Increased Consumption      Alloimmune destruction  Post transfusion, Neonatal, Post-transplant    Autoimmune Syndromes Antiphospholipid Ab syndrome, Evan's syndrome SLE, RA, sarcoid    DIC/Sepsis  Can see DIC in infection and malignancies DVT, CVA    Drug induced Cephalosporins Penicillins    Food Induced Quinine, Herbal preparations (teas)    HIT     ITP     Infection Bacterial  Fungal  Mycobacterial Parasitic Malaria  Viral CMV, Covid 19, EBV, Hep B, Hep C, HIV, Mumps, parvo B19, Rubella VZV, Zika        Microangiopathic Hemolytic Anemia TTP HUS (infection vs drug induced) Atypical HUS   Mechanical Destruction  Heart Lung machines ECMO Intraortic baloon pump   Pregnancy Gestational  HELLP Preeclampsia   Thrombosis DVT, PE, CVA Chronic DIC    Sequestration   Dilutional (hemorrhage, cyrstaloid)  Hemorrhage, Excessive Crystalloids   Gestational     Hypersplenism   Dilutional   Liver disease Cirrhosis,  Fibrosis Steatohepatitis Portal HTN   Pulmonary HTN Right heart failure    Pseudothrombocytopenia       Cold Agglutinins      EDTA-dependent agglutinins (naturally occurring antibodies) In vitro platelet clumping caused by an insufficiently anticoagulated specimen             In vitro platelet clumping caused by glycoprotein IIb/IIIa inhibitors (eg, abciximab) (can also cause true thrombocytopenia)       Giant platelets counted by automated counter as white blood cells rather than platelets       Multiple myeloma       Evaluation:  Obtain CBC with diff, CMP, smear for morphology, SPEP with IEP, free light chains, B12, Folate, DAT, Haptoglobin, Hepatitis and HIV serologies, PT/PTT, D dimer, ANA and RF.    Abdominal U/S  History of thyroid cancer  October 31 2023- Treated with resection  History of renal cell carcinoma  October 31 2023- Treated with left nephrectomy  There is a bidirectional association between renal and thyroid cancers.  Consider Genetics consultation    Cancer Staging  No matching staging information was found for the patient.    No problem-specific Assessment & Plan notes found for this encounter.    Orders Placed This Encounter  Procedures   US Abdomen Complete    Standing Status:   Future    Expected Date:   10/31/2023    Expiration Date:   10/30/2024    Reason for Exam (SYMPTOM  OR DIAGNOSIS REQUIRED):   Thrombocytopenia    Preferred imaging location?:   Internal   CBC with Differential/Platelet    Standing Status:   Future    Number of Occurrences:   1    Expected  Date:   10/31/2023    Expiration Date:   10/30/2024   Cold agglutinin titer    Standing Status:   Future    Number of Occurrences:   1    Expected Date:   10/31/2023    Expiration Date:   10/30/2024   Comprehensive metabolic panel    Standing Status:   Future    Number of Occurrences:   1    Expected Date:   10/31/2023    Expiration  Date:   10/30/2024   Folate    Standing Status:   Future    Number of Occurrences:   1    Expected Date:   10/31/2023    Expiration Date:   10/30/2024   Haptoglobin    Standing Status:   Future    Number of Occurrences:   1    Expected Date:   10/31/2023    Expiration Date:   10/30/2024   Zinc    Standing Status:   Future    Number of Occurrences:   1    Expected Date:   10/31/2023    Expiration Date:   10/30/2024   Vitamin B12    Standing Status:   Future    Number of Occurrences:   1    Expected Date:   10/31/2023    Expiration Date:   10/30/2024   Copper, serum    Standing Status:   Future    Number of Occurrences:   1    Expected Date:   10/31/2023    Expiration Date:   10/30/2024   APTT    Standing Status:   Future    Number of Occurrences:   1    Expected Date:   10/31/2023    Expiration Date:   10/30/2024   Protime-INR    Standing Status:   Future    Number of Occurrences:   1    Expected Date:   10/31/2023    Expiration Date:   10/30/2024   D-dimer, quantitative    Standing Status:   Future    Number of Occurrences:   1    Expected Date:   10/31/2023    Expiration Date:   10/30/2024   Fibrinogen    Standing Status:   Future    Number of Occurrences:   1    Expected Date:   10/31/2023    Expiration Date:   10/30/2024   Hepatitis panel, acute    Standing Status:   Future    Number of Occurrences:   1    Expected Date:   10/31/2023    Expiration Date:   10/30/2024   HIV Antibody (routine testing w rflx)    Standing Status:   Future    Number of Occurrences:   1    Expected Date:   10/31/2023    Expiration Date:   10/30/2024   Kappa/lambda light chains    Standing Status:   Future    Number of Occurrences:   1    Expected Date:   10/31/2023    Expiration Date:   10/30/2024   Multiple Myeloma Panel (SPEP&IFE w/QIG)    Standing Status:   Future    Number of Occurrences:   1    Expected Date:   10/31/2023    Expiration Date:   10/30/2024   Rheumatoid  factor    Standing Status:   Future    Number of Occurrences:   1    Expected Date:  10/31/2023    Expiration Date:   10/30/2024   ANA Comprehensive Panel    Standing Status:   Future    Number of Occurrences:   1    Expected Date:   10/31/2023    Expiration Date:   10/30/2024   Technologist smear review    Standing Status:   Future    Number of Occurrences:   1    Expected Date:   10/31/2023    Expiration Date:   10/30/2024    Clinical information::   Thrombocytopenia   Direct antiglobulin test (not at [facility])    Standing Status:   Future    Number of Occurrences:   1    Expected Date:   10/31/2023    Expiration Date:   10/30/2024    60  minutes was spent in patient care.  This included time spent preparing to see the patient (e.g., review of tests), obtaining and/or reviewing separately obtained history, counseling and educating the patient/family/caregiver, ordering medications, tests, or procedures; documenting clinical information in the electronic or other health record, independently interpreting results and communicating results to the patient/family/caregiver as well as coordination of care.       All questions were answered. The patient knows to call the clinic with any problems, questions or concerns.  This note was electronically signed.    Loni Muse, MD  10/31/2023 1:02 PM

## 2023-11-01 LAB — ANA COMPREHENSIVE PANEL
Anti JO-1: <0.2 AI (ref 0.0–0.9)
Centromere Ab Screen: <0.2 AI (ref 0.0–0.9)
Chromatin Ab SerPl-aCnc: <0.2 AI (ref 0.0–0.9)
ENA SM Ab Ser-aCnc: <0.2 AI (ref 0.0–0.9)
Ribonucleic Protein: 0.2 AI (ref 0.0–0.9)
SSA (Ro) (ENA) Antibody, IgG: 0.2 AI (ref 0.0–0.9)
SSB (La) (ENA) Antibody, IgG: <0.2 AI (ref 0.0–0.9)
Scleroderma (Scl-70) (ENA) Antibody, IgG: <0.2 AI (ref 0.0–0.9)
ds DNA Ab: 5 [IU]/mL (ref 0–9)

## 2023-11-02 LAB — HAPTOGLOBIN: Haptoglobin: 37 mg/dL (ref 34–355)

## 2023-11-02 LAB — RHEUMATOID FACTOR: Rheumatoid fact SerPl-aCnc: <10 [IU]/mL (ref <14.0)

## 2023-11-03 LAB — KAPPA/LAMBDA LIGHT CHAINS
Kappa free light chain: 31.1 mg/L — ABNORMAL HIGH (ref 3.3–19.4)
Kappa, lambda light chain ratio: 1.52 (ref 0.26–1.65)
Lambda free light chains: 20.5 mg/L (ref 5.7–26.3)

## 2023-11-03 LAB — COLD AGGLUTININ TITER: Cold Agglutinin Titer: NEGATIVE

## 2023-11-03 LAB — COPPER, SERUM: Copper: 9 ug/dL — ABNORMAL LOW (ref 69–132)

## 2023-11-04 DIAGNOSIS — J069 Acute upper respiratory infection, unspecified: Secondary | ICD-10-CM | POA: Diagnosis not present

## 2023-11-04 LAB — ZINC: Zinc: 197 ug/dL — ABNORMAL HIGH (ref 44–115)

## 2023-11-06 ENCOUNTER — Other Ambulatory Visit: Payer: Self-pay | Admitting: Pharmacist

## 2023-11-06 DIAGNOSIS — E610 Copper deficiency: Secondary | ICD-10-CM | POA: Insufficient documentation

## 2023-11-07 ENCOUNTER — Telehealth: Payer: Self-pay | Admitting: Oncology

## 2023-11-07 NOTE — Telephone Encounter (Signed)
11/07/23 Spoke with patient and confirmed copper infusion appts.

## 2023-11-17 ENCOUNTER — Inpatient Hospital Stay: Payer: PPO | Attending: Oncology

## 2023-11-17 VITALS — BP 130/88 | HR 60 | Temp 98.0°F | Resp 16

## 2023-11-17 DIAGNOSIS — D649 Anemia, unspecified: Secondary | ICD-10-CM | POA: Diagnosis not present

## 2023-11-17 DIAGNOSIS — N281 Cyst of kidney, acquired: Secondary | ICD-10-CM | POA: Insufficient documentation

## 2023-11-17 DIAGNOSIS — Z905 Acquired absence of kidney: Secondary | ICD-10-CM | POA: Diagnosis not present

## 2023-11-17 DIAGNOSIS — K8020 Calculus of gallbladder without cholecystitis without obstruction: Secondary | ICD-10-CM | POA: Insufficient documentation

## 2023-11-17 DIAGNOSIS — Z79899 Other long term (current) drug therapy: Secondary | ICD-10-CM | POA: Diagnosis not present

## 2023-11-17 DIAGNOSIS — E610 Copper deficiency: Secondary | ICD-10-CM | POA: Insufficient documentation

## 2023-11-17 DIAGNOSIS — D696 Thrombocytopenia, unspecified: Secondary | ICD-10-CM | POA: Diagnosis not present

## 2023-11-17 DIAGNOSIS — K760 Fatty (change of) liver, not elsewhere classified: Secondary | ICD-10-CM | POA: Diagnosis not present

## 2023-11-17 DIAGNOSIS — R161 Splenomegaly, not elsewhere classified: Secondary | ICD-10-CM | POA: Insufficient documentation

## 2023-11-17 LAB — MULTIPLE MYELOMA PANEL, SERUM
Albumin SerPl Elph-Mcnc: 3.8 g/dL (ref 2.9–4.4)
Albumin/Glob SerPl: 1.6 (ref 0.7–1.7)
Alpha 1: 0.2 g/dL (ref 0.0–0.4)
Alpha2 Glob SerPl Elph-Mcnc: 0.5 g/dL (ref 0.4–1.0)
B-Globulin SerPl Elph-Mcnc: 0.8 g/dL (ref 0.7–1.3)
Gamma Glob SerPl Elph-Mcnc: 0.8 g/dL (ref 0.4–1.8)
Globulin, Total: 2.4 g/dL (ref 2.2–3.9)
IgA: 173 mg/dL (ref 61–437)
IgG (Immunoglobin G), Serum: 676 mg/dL (ref 603–1613)
IgM (Immunoglobulin M), Srm: 294 mg/dL — ABNORMAL HIGH (ref 15–143)
Total Protein ELP: 6.2 g/dL (ref 6.0–8.5)

## 2023-11-17 MED ORDER — CUPRIC CHLORIDE 0.4 MG/ML IV SOLN
2.0000 mg | Freq: Every day | INTRAVENOUS | Status: DC
  Administered 2023-11-17: 2 mg via INTRAVENOUS
  Filled 2023-11-17: qty 5

## 2023-11-17 MED ORDER — ONDANSETRON HCL 4 MG/2ML IJ SOLN
8.0000 mg | Freq: Once | INTRAMUSCULAR | Status: AC
  Administered 2023-11-17: 8 mg via INTRAVENOUS
  Filled 2023-11-17: qty 4

## 2023-11-17 MED ORDER — ACETAMINOPHEN 325 MG PO TABS
650.0000 mg | ORAL_TABLET | Freq: Once | ORAL | Status: AC
Start: 2023-11-17 — End: 2023-11-17
  Administered 2023-11-17: 650 mg via ORAL
  Filled 2023-11-17: qty 2

## 2023-11-17 MED ORDER — DIPHENHYDRAMINE HCL 25 MG PO CAPS
25.0000 mg | ORAL_CAPSULE | Freq: Once | ORAL | Status: AC
  Administered 2023-11-17: 25 mg via ORAL
  Filled 2023-11-17: qty 1

## 2023-11-18 ENCOUNTER — Inpatient Hospital Stay: Payer: PPO

## 2023-11-18 VITALS — BP 123/79 | HR 67 | Temp 98.0°F | Resp 18

## 2023-11-18 DIAGNOSIS — E610 Copper deficiency: Secondary | ICD-10-CM

## 2023-11-18 DIAGNOSIS — D696 Thrombocytopenia, unspecified: Secondary | ICD-10-CM | POA: Diagnosis not present

## 2023-11-18 MED ORDER — LORATADINE 10 MG PO TABS
10.0000 mg | ORAL_TABLET | Freq: Every day | ORAL | Status: DC
  Administered 2023-11-18: 10 mg via ORAL
  Filled 2023-11-18: qty 1

## 2023-11-18 MED ORDER — CUPRIC CHLORIDE 0.4 MG/ML IV SOLN
2.0000 mg | Freq: Every day | INTRAVENOUS | Status: DC
  Administered 2023-11-18: 2 mg via INTRAVENOUS
  Filled 2023-11-18: qty 5

## 2023-11-18 MED ORDER — ONDANSETRON HCL 4 MG/2ML IJ SOLN
8.0000 mg | Freq: Once | INTRAMUSCULAR | Status: AC
Start: 2023-11-18 — End: 2023-11-18
  Administered 2023-11-18: 8 mg via INTRAVENOUS
  Filled 2023-11-18: qty 4

## 2023-11-18 MED ORDER — ACETAMINOPHEN 325 MG PO TABS
650.0000 mg | ORAL_TABLET | Freq: Once | ORAL | Status: AC
Start: 2023-11-18 — End: 2023-11-18
  Administered 2023-11-18: 650 mg via ORAL
  Filled 2023-11-18: qty 2

## 2023-11-19 ENCOUNTER — Inpatient Hospital Stay: Payer: PPO

## 2023-11-19 VITALS — BP 124/88 | HR 65 | Temp 97.7°F | Resp 18

## 2023-11-19 DIAGNOSIS — E610 Copper deficiency: Secondary | ICD-10-CM

## 2023-11-19 DIAGNOSIS — D696 Thrombocytopenia, unspecified: Secondary | ICD-10-CM | POA: Diagnosis not present

## 2023-11-19 MED ORDER — LORATADINE 10 MG PO TABS
10.0000 mg | ORAL_TABLET | Freq: Every day | ORAL | Status: DC
Start: 2023-11-19 — End: 2023-11-19
  Administered 2023-11-19: 10 mg via ORAL
  Filled 2023-11-19: qty 1

## 2023-11-19 MED ORDER — SODIUM CHLORIDE 0.9 % IV SOLN
2.0000 mg | Freq: Every day | INTRAVENOUS | Status: DC
  Administered 2023-11-19: 2 mg via INTRAVENOUS
  Filled 2023-11-19: qty 5

## 2023-11-19 MED ORDER — ONDANSETRON HCL 4 MG/2ML IJ SOLN
8.0000 mg | Freq: Once | INTRAMUSCULAR | Status: AC
Start: 2023-11-19 — End: 2023-11-19
  Administered 2023-11-19: 8 mg via INTRAVENOUS
  Filled 2023-11-19: qty 4

## 2023-11-19 MED ORDER — ACETAMINOPHEN 325 MG PO TABS
650.0000 mg | ORAL_TABLET | Freq: Once | ORAL | Status: AC
Start: 2023-11-19 — End: 2023-11-19
  Administered 2023-11-19: 650 mg via ORAL
  Filled 2023-11-19: qty 2

## 2023-11-20 ENCOUNTER — Inpatient Hospital Stay: Payer: PPO

## 2023-11-20 VITALS — BP 128/84 | HR 57 | Temp 98.0°F | Resp 18

## 2023-11-20 DIAGNOSIS — D696 Thrombocytopenia, unspecified: Secondary | ICD-10-CM | POA: Diagnosis not present

## 2023-11-20 DIAGNOSIS — E610 Copper deficiency: Secondary | ICD-10-CM

## 2023-11-20 MED ORDER — ONDANSETRON HCL 4 MG/2ML IJ SOLN
8.0000 mg | Freq: Once | INTRAMUSCULAR | Status: AC
  Administered 2023-11-20: 8 mg via INTRAVENOUS
  Filled 2023-11-20: qty 4

## 2023-11-20 MED ORDER — LORATADINE 10 MG PO TABS
10.0000 mg | ORAL_TABLET | Freq: Every day | ORAL | Status: DC
  Administered 2023-11-20: 10 mg via ORAL
  Filled 2023-11-20: qty 1

## 2023-11-20 MED ORDER — ACETAMINOPHEN 325 MG PO TABS
650.0000 mg | ORAL_TABLET | Freq: Once | ORAL | Status: AC
Start: 2023-11-20 — End: 2023-11-20
  Administered 2023-11-20: 650 mg via ORAL
  Filled 2023-11-20: qty 2

## 2023-11-20 MED ORDER — SODIUM CHLORIDE 0.9 % IV SOLN
2.0000 mg | Freq: Every day | INTRAVENOUS | Status: DC
  Administered 2023-11-20: 2 mg via INTRAVENOUS
  Filled 2023-11-20: qty 5

## 2023-11-21 ENCOUNTER — Other Ambulatory Visit: Payer: Self-pay | Admitting: Pharmacist

## 2023-11-21 ENCOUNTER — Inpatient Hospital Stay: Payer: PPO

## 2023-11-21 VITALS — BP 124/81 | HR 58 | Temp 98.0°F | Resp 18

## 2023-11-21 DIAGNOSIS — D696 Thrombocytopenia, unspecified: Secondary | ICD-10-CM | POA: Diagnosis not present

## 2023-11-21 DIAGNOSIS — E610 Copper deficiency: Secondary | ICD-10-CM

## 2023-11-21 MED ORDER — ACETAMINOPHEN 325 MG PO TABS
650.0000 mg | ORAL_TABLET | Freq: Once | ORAL | Status: AC
  Administered 2023-11-21: 650 mg via ORAL
  Filled 2023-11-21: qty 2

## 2023-11-21 MED ORDER — LORATADINE 10 MG PO TABS
10.0000 mg | ORAL_TABLET | Freq: Every day | ORAL | Status: DC
  Administered 2023-11-21: 10 mg via ORAL
  Filled 2023-11-21: qty 1

## 2023-11-21 MED ORDER — ONDANSETRON HCL 4 MG/2ML IJ SOLN
8.0000 mg | Freq: Once | INTRAMUSCULAR | Status: AC
  Administered 2023-11-21: 8 mg via INTRAVENOUS
  Filled 2023-11-21: qty 4

## 2023-11-21 MED ORDER — SODIUM CHLORIDE 0.9 % IV SOLN
2.0000 mg | Freq: Every day | INTRAVENOUS | Status: DC
  Administered 2023-11-21: 2 mg via INTRAVENOUS
  Filled 2023-11-21: qty 5

## 2023-11-27 ENCOUNTER — Telehealth: Payer: Self-pay | Admitting: Oncology

## 2023-11-27 NOTE — Telephone Encounter (Signed)
11/27/23 LVM -Abd U/S on 12/02/23 arrive at 830am.Appt with Dr Melvyn Neth on 12/03/22@10am 

## 2023-12-02 DIAGNOSIS — D696 Thrombocytopenia, unspecified: Secondary | ICD-10-CM | POA: Diagnosis not present

## 2023-12-03 ENCOUNTER — Other Ambulatory Visit: Payer: Self-pay | Admitting: Oncology

## 2023-12-03 DIAGNOSIS — D696 Thrombocytopenia, unspecified: Secondary | ICD-10-CM

## 2023-12-03 NOTE — Progress Notes (Unsigned)
[facility] Cancer Initial Visit:  Patient Care Team: [name], MD as PCP - General (Internal Medicine) [name], MD as PCP - Cardiology (Cardiology) Janalyn Harder, MD (Inactive) as Consulting Physician (Dermatology)  CHIEF COMPLAINTS/PURPOSE OF CONSULTATION:  HISTORY OF PRESENTING ILLNESS: Eric Suarez 78 y.o. male is here because of  thrombocytopenia Medical history notable for hypothyroidism, hypertension, BPH, CKD stage III, coronary artery disease, thyroid cancer, renal cell carcinoma left kidney treated with nephrectomy, lung nodules  August 12 2023:  Colonoscopy--  Tattoo in mid transverse colon. Post-polypectomy scar at the tattoo site. Three 5 to 10 mm polyps in the ascending colon, removed.  Two 12 mm polyps in the transverse colon, removed.  Diverticulosis in the left colon.  Pathology - Tubular adenoma x 3 and inflammatory polyps  August 13 2023 through August 14 2023- Admission to [facility] for observation due to post procedural bleeding.    October 08, 2023: WBC 5.0 hemoglobin 12.7 platelet count 86; 69 segs 24 lymphs 6 monos 1 EO Of note platelet count in November 2023 was 109 and in November 2022 platelet count was 178  October 31 2023:  [facility] Hematology Consult The kidney cancer and thyroid cancer were both treated with resection.  No history of XRT or chemotherapy.  No history of blood transfusion.   Increased bruising since starting ASA 81 mg   Social:  Married.  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  Tobacco none.  EtOH none  FMH Mother died 7 dementia Father died 84 MI Brother died 47 MI   Review of Systems  Constitutional:  Negative for appetite change, chills, fatigue, fever and unexpected weight change.  HENT:   Negative for hearing loss, mouth sores, nosebleeds and sore throat.        No gingival bleeding  Eyes:  Negative for eye problems and icterus.       Vision  changes:  None  Respiratory:  Negative for chest tightness, cough, hemoptysis and shortness of breath.        PND:  none Orthopnea:  none DOE:    Cardiovascular:  Negative for chest pain, leg swelling and palpitations.       PND:  none Orthopnea:  none  Gastrointestinal:  Negative for abdominal pain, blood in stool, constipation, nausea and vomiting.       Has experienced some diarrhea helped by fiber  Endocrine: Negative for hot flashes.       Cold intolerance:  none Heat intolerance:  none  Genitourinary:  Negative for bladder incontinence, difficulty urinating, dysuria, frequency, hematuria and nocturia.   Musculoskeletal:  Negative for myalgias, neck pain and neck stiffness.       Arthralgias of hands, back and knee  Skin:  Negative for itching, rash and wound.  Neurological:  Negative for dizziness, headaches, light-headedness, seizures and speech difficulty.       Numbness of fingers.  Larey Seat a few times during the last year due to a foot drop  Hematological:  Negative for adenopathy. Bruises/bleeds easily.  Psychiatric/Behavioral:  Negative for suicidal ideas. The patient is not nervous/anxious.        Sleep sometimes affected by pain in hands    MEDICAL HISTORY: Past Medical History:  Diagnosis Date   Anxiety    Arthritis    BPH (benign prostatic hyperplasia)    Cataracts, bilateral    Chronic back pain    Followed by Dr. Lovell Sheehan   CKD (  chronic kidney disease) stage 3, GFR 30-59 ml/min (HCC) 09/2021   Creatinine 1.47   Complication of anesthesia    unable to void after surgery   Coronary artery disease 10/31/2021   Coronary Calcium Score 535   GERD (gastroesophageal reflux disease)    History of hiatal hernia    History of kidney surgery    left kidney removed due to cancer 2017   History of thyroid cancer 1998   Thyroidectomy-now on Synthroid   Hyperlipidemia    Hypertension    Hypothyroidism (acquired) 1998   Post thyroidectomy for thyroid cancer   Macular  degeneration of both eyes    dry   Mixed dyslipidemia 09/27/2021   Low HDL, high triglycerides with normal LDL: TC 117, TG 135, HDL 25, LDL 56.   Nocturia    Occipital neuralgia    PONV (postoperative nausea and vomiting)    Renal cell carcinoma, left (HCC) 2017   Left nephrectomy-Dr. Billey Chang, complicated by multiple different hernia surgeries.   Renal cyst     SURGICAL HISTORY: Past Surgical History:  Procedure Laterality Date   APPENDECTOMY     COLONOSCOPY     COLONOSCOPY WITH PROPOFOL N/A 08/14/2023   Procedure: COLONOSCOPY WITH PROPOFOL;  Surgeon: Shellia Cleverly, DO;  [facility];  Service: Gastroenterology;  Laterality: N/A;   CYSTOSCOPY WITH RETROGRADE PYELOGRAM, URETEROSCOPY AND STENT PLACEMENT Left 04/01/2016   Procedure: CYSTOSCOPY WITH RETROGRADE PYELOGRAM, left ureter;  Surgeon: Heloise Purpura, MD;  Location: [facility];  Service: Urology;  Laterality: Left;   HEMORRHOID SURGERY     HEMOSTASIS CLIP PLACEMENT  08/14/2023   Procedure: HEMOSTASIS CLIP PLACEMENT;  Surgeon: Shellia Cleverly, DO;  [facility]: [facility];  Service: Gastroenterology;;   HERNIA REPAIR     inguinal-3 on left , 1 on right   INCISIONAL HERNIA REPAIR N/A 12/23/2016   Procedure: LAPAROSCOPIC VERSUS OPEN LYSIS OF ADHESIONS AND INCISIONAL HERNIA REPAIR WITH MESH;  Surgeon: Axel Filler, MD;  Location: [facility];  Service: General;  Laterality: N/A;   INCISIONAL HERNIA REPAIR  10/08/2017   OPEN   INCISIONAL HERNIA REPAIR N/A 10/08/2017   Procedure: OPEN HERNIA REPAIR INCISIONAL ERAS PATHWAY;  Surgeon: Axel Filler, MD;  [facility] OR;  Service: General;  Laterality: N/A;   INSERTION OF MESH N/A 12/23/2016   Procedure: INSERTION OF MESH;  Surgeon: Axel Filler, MD;  [facility];  Service: General;  Laterality: N/A;   INSERTION OF MESH N/A 01/10/2017   Procedure: INSERTION OF MESH;  Surgeon: Axel Filler, MD;  [facility];  Service: General;  Laterality: N/A;   INSERTION OF  MESH N/A 10/08/2017   Procedure: INSERTION OF MESH;  Surgeon: Axel Filler, MD;  Location: [facility] OR;  Service: General;  Laterality: N/A;   LAPAROSCOPIC NEPHRECTOMY Left 04/18/2016   Procedure: LAPAROSCOPIC RADICAL  LEFT NEPHRECTOMY;  Surgeon: Heloise Purpura, MD;  [facility];  Service: Urology;  Laterality: Left;   LAPAROSCOPY N/A 01/10/2017   Procedure: LAPAROSCOPY DIAGNOSTIC , POSSIBLE LAPAROTOMY;  Surgeon: Axel Filler, MD;  [facility];  Service: General;  Laterality: N/A;   NASAL SINUS SURGERY     left side   NASAL SINUS SURGERY Left 10/25/2019   Procedure: ENDOSCOPIC SINUS SURGERY/ethmoid and maxillary;  Surgeon: Serena Colonel, MD;  Location: [facility];  Service: ENT;  Laterality: Left;   THYROIDECTOMY, PARTIAL  1998   left small amount on left side-for cancer   TONSILLECTOMY     TOTAL KNEE ARTHROPLASTY Right 11/19/2022  Procedure: RIGHT TOTAL KNEE ARTHROPLASTY;  Surgeon: Cammy Copa, MD;  Location: [facility] OR;  Service: Orthopedics;  Laterality: Right;    SOCIAL HISTORY: Social History   Socioeconomic History   Marital status: Married    Spouse name: Not on file   Number of children: 1   Years of education: Not on file   Highest education level: Not on file  Occupational History   Occupation: retired  Tobacco Use   Smoking status: Never   Smokeless tobacco: Never  Vaping Use   Vaping status: Never Used  Substance and Sexual Activity   Alcohol use: No   Drug use: No   Sexual activity: Not on file  Other Topics Concern   Not on file  Social History Narrative   Married father 1 son who lives in [place].  He lives with his wife.      Drink 2 cups of coffee a day.      Previously used to exercise quite a bit with significant daily walking and mowing multiple lawns.  (His house, along with several neighbors and the church lawn) now he does still do some yard work and occasional walking.  But since the onset of COVID, has slowly  declined in and out of exercise.   Social Drivers of Corporate investment banker Strain: Not on file  Food Insecurity: No Food Insecurity (08/13/2023)   Hunger Vital Sign    Worried About Running Out of Food in the Last Year: Never true    Ran Out of Food in the Last Year: Never true  Transportation Needs: No Transportation Needs (08/13/2023)   PRAPARE - Administrator, Civil Service (Medical): No    Lack of Transportation (Non-Medical): No  Physical Activity: Not on file  Stress: Not on file  Social Connections: Not on file  Intimate Partner Violence: Not At Risk (08/13/2023)   Humiliation, Afraid, Rape, and Kick questionnaire    Fear of Current or Ex-Partner: No    Emotionally Abused: No    Physically Abused: No    Sexually Abused: No    FAMILY HISTORY Family History  Problem Relation Age of Onset   Dementia Mother    Coronary artery disease Father 74       Longstanding smoker   Heart attack Father 46       Second MI was at 87   Hypertension Father    Hyperlipidemia Father    Heart attack Brother 56       Sudden cardiac death from MI   Healthy Son    Liver disease Neg Hx    Esophageal cancer Neg Hx    Colon cancer Neg Hx     ALLERGIES:  is allergic to tamsulosin, fish oil, rosuvastatin calcium, and sulfa antibiotics.  MEDICATIONS:  Current Outpatient Medications  Medication Sig Dispense Refill   acetaminophen (TYLENOL) 325 MG tablet Take 1-2 tablets (325-650 mg total) by mouth every 6 (six) hours as needed for mild pain (pain score 1-3 or temp > 100.5). 40 tablet 0   alfuzosin (UROXATRAL) 10 MG 24 hr tablet Take 10 mg daily by mouth.   11   Ascorbic Acid (VITAMIN C) 500 MG CHEW Chew 500 mg by mouth daily.     aspirin 81 MG chewable tablet Chew 1 tablet (81 mg total) by mouth 2 (two) times daily. 42 tablet 0   atenolol-chlorthalidone (TENORETIC) 50-25 MG tablet Take 0.5 tablets by mouth daily.     Cholecalciferol (VITAMIN  D3) 25 MCG (1000 UT) CAPS Take  1,000 Units by mouth daily.     dutasteride (AVODART) 0.5 MG capsule Take 0.5 mg daily by mouth.     ezetimibe (ZETIA) 10 MG tablet Take 10 mg by mouth daily.     gabapentin (NEURONTIN) 400 MG capsule Take 400 mg by mouth 2 (two) times daily.     levothyroxine (SYNTHROID) 125 MCG tablet Take 125 mcg by mouth daily before breakfast.     Multiple Vitamins-Minerals (EYE VITAMINS PO) Take 1 tablet by mouth daily.     nitroGLYCERIN (NITROSTAT) 0.4 MG SL tablet Place 0.4 mg under the tongue every 5 (five) minutes as needed for chest pain.     omeprazole (PRILOSEC) 20 MG capsule Take 20 mg by mouth at bedtime.     PARoxetine (PAXIL-CR) 25 MG 24 hr tablet Take 25 mg by mouth daily.  3   potassium chloride SA (K-DUR,KLOR-CON) 20 MEQ tablet Take 20 mEq by mouth every other day.     Zinc 100 MG TABS Take 1 tablet by mouth daily.     No current facility-administered medications for this visit.    PHYSICAL EXAMINATION:  ECOG PERFORMANCE STATUS: 0 - Asymptomatic   There were no vitals filed for this visit.   There were no vitals filed for this visit.    Physical Exam Vitals and nursing note reviewed.  Constitutional:      General: He is not in acute distress.    Appearance: Normal appearance. He is normal weight. He is not ill-appearing, toxic-appearing or diaphoretic.     Comments: Here with wife  HENT:     Head: Normocephalic and atraumatic.     Right Ear: External ear normal.     Left Ear: External ear normal.     Nose: Nose normal.  Eyes:     Conjunctiva/sclera: Conjunctivae normal.     Pupils: Pupils are equal, round, and reactive to light.  Cardiovascular:     Rate and Rhythm: Normal rate and regular rhythm.     Heart sounds:     No friction rub. No gallop.  Pulmonary:     Effort: Pulmonary effort is normal. No respiratory distress.     Breath sounds: Normal breath sounds. No wheezing or rhonchi.  Abdominal:     Palpations: Abdomen is soft.     Tenderness: There is no  abdominal tenderness. There is no guarding or rebound.     Hernia: No hernia is present.  Musculoskeletal:     Cervical back: Normal range of motion and neck supple. No rigidity or tenderness.     Right lower leg: No edema.     Left lower leg: No edema.     Comments: Arthritic changes of hands  Lymphadenopathy:     Head:     Right side of head: No submental, submandibular, tonsillar, preauricular, posterior auricular or occipital adenopathy.     Left side of head: No submental, submandibular, tonsillar, preauricular, posterior auricular or occipital adenopathy.     Cervical: No cervical adenopathy.     Right cervical: No superficial, deep or posterior cervical adenopathy.    Left cervical: No superficial, deep or posterior cervical adenopathy.     Upper Body:     Right upper body: No supraclavicular or axillary adenopathy.     Left upper body: No supraclavicular or axillary adenopathy.     Lower Body: No right inguinal adenopathy. No left inguinal adenopathy.  Skin:    General: Skin is warm  and dry.     Coloration: Skin is not jaundiced or pale.     Findings: Bruising present.  Neurological:     General: No focal deficit present.     Mental Status: He is alert and oriented to person, place, and time.     Cranial Nerves: No cranial nerve deficit.  Psychiatric:        Mood and Affect: Mood normal.        Behavior: Behavior normal.        Thought Content: Thought content normal.        Judgment: Judgment normal.      LABORATORY DATA: I have personally reviewed the data as listed:  No visits with results within 1 Month(s) from this visit.  Latest known visit with results is:  Appointment on 10/31/2023  Component Date Value Ref Range Status   WBC MORPHOLOGY 10/31/2023 MORPHOLOGY UNREMARKABLE   Final   RBC MORPHOLOGY 10/31/2023 MORPHOLOGY UNREMARKABLE   Final   Plt Morphology 10/31/2023 LARGE PLATELETS NOTED ON SMEAR   Final   PLATELETS APPEAR DECREASED   Clinical Information  10/31/2023 Thrombocytopenia   Final   Performed at [facility] at [facility], [address] [place], [place], [address]   ds DNA Ab 10/31/2023 5  0 - 9 IU/mL Final   Comment: (NOTE)                                   Negative      <5                                   Equivocal  5 - 9                                   Positive      >9    Ribonucleic Protein 10/31/2023 0.2  0.0 - 0.9 AI Final   ENA SM Ab Ser-aCnc 10/31/2023 <0.2  0.0 - 0.9 AI Final   Scleroderma (Scl-70) (ENA) Antibod* 10/31/2023 <0.2  0.0 - 0.9 AI Final   SSA (Ro) (ENA) Antibody, IgG 10/31/2023 0.2  0.0 - 0.9 AI Final   SSB (La) (ENA) Antibody, IgG 10/31/2023 <0.2  0.0 - 0.9 AI Final   Chromatin Ab SerPl-aCnc 10/31/2023 <0.2  0.0 - 0.9 AI Final   Anti JO-1 10/31/2023 <0.2  0.0 - 0.9 AI Final   Centromere Ab Screen 10/31/2023 <0.2  0.0 - 0.9 AI Final   See below: 10/31/2023 Comment   Final   Comment: (NOTE) Autoantibody                       Disease Association ------------------------------------------------------------                        Condition                  Frequency ---------------------   ------------------------   --------- Antinuclear Antibody,    SLE, mixed connective Direct (ANA-D)           tissue diseases ---------------------   ------------------------   --------- dsDNA                    SLE  40 - 60% ---------------------   ------------------------   --------- Chromatin                Drug induced SLE                90%                         SLE                        48 - 97% ---------------------   ------------------------   --------- SSA (Ro)                 SLE                        25 - 35%                         Sjogren's Syndrome         40 - 70%                         Neonatal Lupus                 100% ---------------------   ------------------------   --------- SSB (La)                 SLE                                                        10%                         Sjogren's Syndrome              30% ---------------------   -----------------------    --------- Sm (anti-Smith)          SLE                        15 - 30% ---------------------   -----------------------    --------- RNP                      Mixed Connective Tissue                         Disease                         95% (U1 nRNP,                SLE                        30 - 50% anti-ribonucleoprotein)  Polymyositis and/or                         Dermatomyositis                 20% ---------------------   ------------------------   --------- Scl-70 (antiDNA          Scleroderma (diffuse)      20 - 35% topoisomerase)  Crest                           13% ---------------------   ------------------------   --------- Jo-1                     Polymyositis and/or                         Dermatomyositis            20 - 40% ---------------------   ------------------------   --------- Centromere B             Scleroderma -                           Crest                         variant                         80% Performed At: [facility] [address] [place], [place] [id number] Jolene Schimke MD EX:[id number]    Rheumatoid fact SerPl-aCnc 10/31/2023 <10.0  <14.0 IU/mL Final   Comment: (NOTE) Performed At: [facility] [address] [place], [place] [id number] Jolene Schimke MD [id number]:[id number]    IgG (Immunoglobin G), Serum 10/31/2023 676  603 - 1,613 mg/dL Final   IgA 38/75/6433 173  61 - 437 mg/dL Final   IgM (Immunoglobulin M), Srm 10/31/2023 294 (H)  15 - 143 mg/dL Final   Total Protein ELP 10/31/2023 6.2  6.0 - 8.5 g/dL Corrected   Albumin SerPl Elph-Mcnc 10/31/2023 3.8  2.9 - 4.4 g/dL Corrected   Alpha 1 29/51/8841 0.2  0.0 - 0.4 g/dL Corrected   Alpha2 Glob SerPl Elph-Mcnc 10/31/2023 0.5  0.4 - 1.0 g/dL Corrected   B-Globulin SerPl Elph-Mcnc 10/31/2023 0.8  0.7 - 1.3 g/dL Corrected   Gamma Glob SerPl Elph-Mcnc 10/31/2023  0.8  0.4 - 1.8 g/dL Corrected   M Protein SerPl Elph-Mcnc 10/31/2023 Not Observed  Not Observed g/dL Corrected   Globulin, Total 10/31/2023 2.4  2.2 - 3.9 g/dL Corrected   Albumin/Glob SerPl 10/31/2023 1.6  0.7 - 1.7 Corrected   IFE 1 10/31/2023 Comment (A)   Corrected   Polyclonal increase detected in one or more immunoglobulins.   Please Note 10/31/2023 Comment   Corrected   Comment: (NOTE) Protein electrophoresis scan will follow via computer, mail, or courier delivery. Performed At: [facility] [address] [place], [place] [id number] Jolene Schimke MD FU:[id number]    Kappa free light chain 10/31/2023 31.1 (H)  3.3 - 19.4 mg/L Final   Lambda free light chains 10/31/2023 20.5  5.7 - 26.3 mg/L Final   Kappa, lambda light chain ratio 10/31/2023 1.52  0.26 - 1.65 Final   Comment: (NOTE) Performed At: [facility] [address] [place], [place] [id number] Jolene Schimke MD BJ:[id number]    HIV Screen 4th Generation wRfx 10/31/2023 Non Reactive  Non Reactive Final   Performed at [facility], [address], [place], [place] [address]   Hepatitis B Surface Ag 10/31/2023 NON REACTIVE  NON REACTIVE Final   HCV Ab 10/31/2023 NON REACTIVE  NON REACTIVE Final   Comment: (NOTE) Nonreactive HCV antibody screen is consistent with no HCV infections,  unless recent infection is suspected or other evidence  exists to indicate HCV infection.     Hep A IgM 10/31/2023 NON REACTIVE  NON REACTIVE Final   Hep B C IgM 10/31/2023 NON REACTIVE  NON REACTIVE Final   Performed at [facility], [address] [place], [place] [address]   Fibrinogen 10/31/2023 307  210 - 475 mg/dL Final   Comment: (NOTE) Fibrinogen results may be underestimated in patients receiving thrombolytic therapy. Performed at [facility], [address], [place], [place] [address]    D-Dimer, Quant 10/31/2023 1.02 (H)  0.00 - 0.50 ug/mL-FEU Final   Comment: (NOTE) At the  manufacturer cut-off value of 0.5 g/mL FEU, this assay has a negative predictive value of 95-100%.This assay is intended for use in conjunction with a clinical pretest probability (PTP) assessment model to exclude pulmonary embolism (PE) and deep venous thrombosis (DVT) in outpatients suspected of PE or DVT. Results should be correlated with clinical presentation. Performed at [facility], [address], [place], [place] [address]    Prothrombin Time 10/31/2023 14.2  11.4 - 15.2 seconds Final   INR 10/31/2023 1.1  0.8 - 1.2 Final   Comment: (NOTE) INR goal varies based on device and disease states. Performed at [facility], [address] [place], [place] [address]    aPTT 10/31/2023 38 (H)  24 - 36 seconds Final   Comment:        IF BASELINE aPTT IS ELEVATED, SUGGEST PATIENT RISK ASSESSMENT BE USED TO DETERMINE APPROPRIATE ANTICOAGULANT THERAPY. Performed at [facility], [address], [place], [place] [address]    DAT, complement 10/31/2023 NEG   Final   DAT, IgG 10/31/2023    Final                   Value:NEG Performed at [facility], [address], [place], [place] [address]    Copper 10/31/2023 9 (L)  69 - 132 ug/dL Final   Comment: (NOTE) **Verified by repeat analysis** This test was developed and its performance characteristics determined by [facility]. It has not been cleared or approved by the Food and Drug Administration.                                Detection Limit = 5 Performed At: [facility] [address] [place], [place] [id number] Jolene Schimke MD DD:[id number]    Vitamin B-12 10/31/2023 814  180 - 914 pg/mL Final   Comment: (NOTE) This assay is not validated for testing neonatal or myeloproliferative syndrome specimens for Vitamin B12 levels. Performed at [facility], [address] [place], [place] [address]    Zinc 10/31/2023 197 (H)  44  - 115 ug/dL Final   Comment: (NOTE) **Verified by repeat analysis** This test was developed and its performance characteristics determined by [facility]. It has not been cleared or approved by the Food and Drug Administration.                                Detection Limit = 5 Performed At: [facility] [address] [place], [place] [id number] Jolene Schimke MD WV:[id number]    Haptoglobin 10/31/2023 37  34 - 355 mg/dL Final   Comment: (NOTE) Performed At: [facility] [address] [place], [place] [id number] Jolene Schimke MD KX:[id number]    Folate 10/31/2023 39.5  >  5.9 ng/mL Final   Comment: RESULT CONFIRMED BY MANUAL DILUTION Performed at [facility], [address], [place], [place] [address]    Sodium 10/31/2023 141  135 - 145 mmol/L Final   Potassium 10/31/2023 4.4  3.5 - 5.1 mmol/L Final   Chloride 10/31/2023 106  98 - 111 mmol/L Final   CO2 10/31/2023 22  22 - 32 mmol/L Final   Glucose, Bld 10/31/2023 101 (H)  70 - 99 mg/dL Final   Glucose reference range applies only to samples taken after fasting for at least 8 hours.   BUN 10/31/2023 17  8 - 23 mg/dL Final   Creatinine, Ser 10/31/2023 1.40 (H)  0.61 - 1.24 mg/dL Final   Calcium 13/06/6577 9.0  8.9 - 10.3 mg/dL Final   Total Protein 46/96/2952 6.2 (L)  6.5 - 8.1 g/dL Final   Albumin 84/13/2440 4.2  3.5 - 5.0 g/dL Final   AST 09/07/2535 19  15 - 41 U/L Final   ALT 10/31/2023 16  0 - 44 U/L Final   Alkaline Phosphatase 10/31/2023 82  38 - 126 U/L Final   Total Bilirubin 10/31/2023 0.7  <1.2 mg/dL Final   GFR, Estimated 10/31/2023 51 (L)  >60 mL/min Final   Comment: (NOTE) Calculated using the CKD-EPI Creatinine Equation (2021)    Anion gap 10/31/2023 12  5 - 15 Final   Performed at [facility], [address] [place], [place], [address]   Cold Agglutinin Titer 10/31/2023 Negative  Neg <1:32 Final   Comment: (NOTE) Performed At: [facility] [address] [place], [place] [id number] Jolene Schimke MD OV:[id number]    WBC 10/31/2023 4.3  4.0 - 10.5 K/uL Final   RBC 10/31/2023 4.12 (L)  4.22 - 5.81 MIL/uL Final   Hemoglobin 10/31/2023 12.1 (L)  13.0 - 17.0 g/dL Final   HCT 84/16/6063 36.2 (L)  39.0 - 52.0 % Final   MCV 10/31/2023 87.9  80.0 - 100.0 fL Final   MCH 10/31/2023 29.4  26.0 - 34.0 pg Final   MCHC 10/31/2023 33.4  30.0 - 36.0 g/dL Final   RDW 01/60/1093 16.4 (H)  11.5 - 15.5 % Final   Platelets 10/31/2023 77 (L)  150 - 400 K/uL Final   Comment: REPEATED TO VERIFY CONSISTENT WITH PREVIOUS RESULT    nRBC 10/31/2023 0.00  0.0 - 0.2 % Final   Neutrophils Relative % 10/31/2023 70  % Final   Neutro Abs 10/31/2023 3.0  1.7 - 7.7 K/uL Final   Lymphocytes Relative 10/31/2023 21  % Final   Lymphs Abs 10/31/2023 0.9  0.7 - 4.0 K/uL Final   Monocytes Relative 10/31/2023 7  % Final   Monocytes Absolute 10/31/2023 0.3  0.1 - 1.0 K/uL Final   Eosinophils Relative 10/31/2023 1  % Final   Eosinophils Absolute 10/31/2023 0.0  0.0 - 0.5 K/uL Final   Basophils Relative 10/31/2023 0  % Final   Basophils Absolute 10/31/2023 0.0  0.0 - 0.1 K/uL Final   Immature Granulocytes 10/31/2023 1  % Final   Abs Immature Granulocytes 10/31/2023 0.02  0.00 - 0.07 K/uL Final   nRBC 10/31/2023 0  0 /100 WBC Final   Immature Platelet Fraction 10/31/2023 4.7  1.2 - 8.6 % Final   Performed at [facility] at [facility], [address] [place], [place], [address]    RADIOGRAPHIC STUDIES: I have personally reviewed the radiological images as listed and agree with the findings in the  report  No results found.  ASSESSMENT/PLAN  78 y.o. male with thrombocytopenia.  Medical history notable for hypothyroidism, hypertension, BPH, CKD stage III, coronary artery disease, thyroid cancer, renal cell carcinoma left kidney treated with nephrectomy, lung nodules  Thrombocytopenia-  October 31 2023-  Patient has moderate  thrombocytopenia which has evolved over the last year.  He has not experienced major bleeding complications but has noted increased bruising in setting of antiplatelet therapy with ASA 81 mg daily  Possible causes in this patient  Decreased Production      Bone marrow failure  Aplastic anemia PNH, Schwachman-Diamond Syndrome    Bone marrow malignancies AML Myelodysplasia Myeloproliferative disorders Multiple myeloma PNH    Bone marrow suppression  Drug induced Acetaminophen,  Chlorthalidone     Congenital Macrothrombocytopenias Alport syndrome Bernard-Soulier Syndrome Fanconi anemia Platelet type or pseudo von-Willebrand disease Wiskott-Aldrich Syndrome Bernard-Soulier syndrome May-Hegglin anomaly       Infection  Bacterial Leptospirosis, brucellosis, anaplasmosis  Viral  Mycobacterial Tuberculosis MAC      Neoplastic marrow infiltration Lymphoid malignancies Solid tumors      Nutritional Deficiencies Vitamin B12, folic acid copper        Increased Consumption      Alloimmune destruction  Post transfusion, Neonatal, Post-transplant    Autoimmune Syndromes Antiphospholipid Ab syndrome, Evan's syndrome SLE, RA, sarcoid    DIC/Sepsis  Can see DIC in infection and malignancies DVT, CVA    Drug induced Cephalosporins Penicillins    Food Induced Quinine, Herbal preparations (teas)    HIT     ITP     Infection Bacterial  Fungal  Mycobacterial Parasitic Malaria  Viral CMV, Covid 19, EBV, Hep B, Hep C, HIV, Mumps, parvo B19, Rubella VZV, Zika        Microangiopathic Hemolytic Anemia TTP HUS (infection vs drug induced) Atypical HUS   Mechanical Destruction  Heart Lung machines ECMO Intraortic baloon pump   Pregnancy Gestational  HELLP Preeclampsia   Thrombosis DVT, PE, CVA Chronic DIC    Sequestration   Dilutional (hemorrhage, cyrstaloid)  Hemorrhage, Excessive Crystalloids   Gestational     Hypersplenism  Dilutional   Liver disease  Cirrhosis,  Fibrosis Steatohepatitis Portal HTN   Pulmonary HTN Right heart failure    Pseudothrombocytopenia       Cold Agglutinins      EDTA-dependent agglutinins (naturally occurring antibodies) In vitro platelet clumping caused by an insufficiently anticoagulated specimen             In vitro platelet clumping caused by glycoprotein IIb/IIIa inhibitors (eg, abciximab) (can also cause true thrombocytopenia)       Giant platelets counted by automated counter as white blood cells rather than platelets       Multiple myeloma       Evaluation:  Obtain CBC with diff, CMP, smear for morphology, SPEP with IEP, free light chains, B12, Folate, DAT, Haptoglobin, Hepatitis and HIV serologies, PT/PTT, D dimer, ANA and RF.    Abdominal U/S  History of thyroid cancer  October 31 2023- Treated with resection  History of renal cell carcinoma  October 31 2023- Treated with left nephrectomy  There is a bidirectional association between renal and thyroid cancers.  Consider Genetics consultation    Cancer Staging  No matching staging information was found for the patient.    No problem-specific Assessment & Plan notes found for this encounter.    No orders of the defined types were placed in this encounter.   60  minutes was  spent in patient care.  This included time spent preparing to see the patient (e.g., review of tests), obtaining and/or reviewing separately obtained history, counseling and educating the patient/family/caregiver, ordering medications, tests, or procedures; documenting clinical information in the electronic or other health record, independently interpreting results and communicating results to the patient/family/caregiver as well as coordination of care.       All questions were answered. The patient knows to call the clinic with any problems, questions or concerns.  This note was electronically signed.    Weston Settle, MD  12/03/2023 2:08 PM

## 2023-12-04 ENCOUNTER — Encounter: Payer: Self-pay | Admitting: Oncology

## 2023-12-04 ENCOUNTER — Inpatient Hospital Stay (HOSPITAL_BASED_OUTPATIENT_CLINIC_OR_DEPARTMENT_OTHER): Payer: PPO | Admitting: Oncology

## 2023-12-04 ENCOUNTER — Inpatient Hospital Stay: Payer: PPO

## 2023-12-04 ENCOUNTER — Other Ambulatory Visit: Payer: Self-pay | Admitting: Oncology

## 2023-12-04 VITALS — BP 116/77 | HR 56 | Temp 97.6°F | Resp 16 | Ht 71.7 in | Wt 201.7 lb

## 2023-12-04 DIAGNOSIS — E610 Copper deficiency: Secondary | ICD-10-CM

## 2023-12-04 DIAGNOSIS — D696 Thrombocytopenia, unspecified: Secondary | ICD-10-CM

## 2023-12-04 LAB — CBC WITH DIFFERENTIAL (CANCER CENTER ONLY)
Abs Immature Granulocytes: 0.02 10*3/uL (ref 0.00–0.07)
Basophils Absolute: 0 10*3/uL (ref 0.0–0.1)
Basophils Relative: 0 %
Eosinophils Absolute: 0.1 10*3/uL (ref 0.0–0.5)
Eosinophils Relative: 1 %
HCT: 40.3 % (ref 39.0–52.0)
Hemoglobin: 13.4 g/dL (ref 13.0–17.0)
Immature Granulocytes: 0 %
Immature Platelet Fraction: 3.8 % (ref 1.2–8.6)
Lymphocytes Relative: 20 %
Lymphs Abs: 1.3 10*3/uL (ref 0.7–4.0)
MCH: 29.7 pg (ref 26.0–34.0)
MCHC: 33.3 g/dL (ref 30.0–36.0)
MCV: 89.4 fL (ref 80.0–100.0)
Monocytes Absolute: 0.4 10*3/uL (ref 0.1–1.0)
Monocytes Relative: 6 %
Neutro Abs: 4.4 10*3/uL (ref 1.7–7.7)
Neutrophils Relative %: 73 %
Platelet Count: 98 10*3/uL — ABNORMAL LOW (ref 150–400)
RBC: 4.51 MIL/uL (ref 4.22–5.81)
RDW: 16.8 % — ABNORMAL HIGH (ref 11.5–15.5)
WBC Count: 6.1 10*3/uL (ref 4.0–10.5)
nRBC: 0 % (ref 0.0–0.2)
nRBC: 0 /100{WBCs}

## 2023-12-04 LAB — CMP (CANCER CENTER ONLY)
ALT: 16 U/L (ref 0–44)
AST: 19 U/L (ref 15–41)
Albumin: 4.5 g/dL (ref 3.5–5.0)
Alkaline Phosphatase: 85 U/L (ref 38–126)
Anion gap: 13 (ref 5–15)
BUN: 22 mg/dL (ref 8–23)
CO2: 23 mmol/L (ref 22–32)
Calcium: 9.3 mg/dL (ref 8.9–10.3)
Chloride: 103 mmol/L (ref 98–111)
Creatinine: 1.64 mg/dL — ABNORMAL HIGH (ref 0.61–1.24)
GFR, Estimated: 43 mL/min — ABNORMAL LOW (ref >60)
Glucose, Bld: 165 mg/dL — ABNORMAL HIGH (ref 70–99)
Potassium: 4.3 mmol/L (ref 3.5–5.1)
Sodium: 138 mmol/L (ref 135–145)
Total Bilirubin: 0.8 mg/dL (ref 0.0–1.2)
Total Protein: 6.9 g/dL (ref 6.5–8.1)

## 2023-12-04 NOTE — Progress Notes (Signed)
[facility]  [address] [place],  [place]  [address] [phone]  Clinic Day:  12/04/2023  Referring physician: [name], MD   HISTORY OF PRESENT ILLNESS:  The patient is a 78 y.o. male who our office recently again seen for thrombocytopenia.  Labs also showed him to be mildly anemic.  Of note, a battery of labs done at his initial visit showed his copper level to be extremely low.  This led to him receiving IV copper, for which he tolerated very well.  He comes in today to reassess his peripheral counts.  Since his last visit, the patient has been doing well.  With respect to his thrombocytopenia, he continues to deny having any subcutaneous bleeding/bruising issues which concern him for having severely low platelets.  Per hospital records, this gentleman has had borderline thrombocytopenia dating back to at least 2018.  PHYSICAL EXAM:  Blood pressure 116/77, pulse (!) 56, temperature 97.6 F (36.4 C), temperature source Oral, resp. rate 16, height 5' 11.7" (1.821 m), weight 201 lb 11.2 oz (91.5 kg), SpO2 100%. Wt Readings from Last 3 Encounters:  12/04/23 201 lb 11.2 oz (91.5 kg)  10/31/23 205 lb 11.2 oz (93.3 kg)  08/14/23 199 lb (90.3 kg)   Body mass index is 27.58 kg/m. Performance status (ECOG): 1 - Symptomatic but completely ambulatory Physical Exam Constitutional:      Appearance: Normal appearance. He is not ill-appearing.  HENT:     Mouth/Throat:     Mouth: Mucous membranes are moist.     Pharynx: Oropharynx is clear. No oropharyngeal exudate or posterior oropharyngeal erythema.  Cardiovascular:     Rate and Rhythm: Normal rate and regular rhythm.     Heart sounds: No murmur heard.    No friction rub. No gallop.  Pulmonary:     Effort: Pulmonary effort is normal. No respiratory distress.     Breath sounds: Normal breath sounds. No wheezing, rhonchi or rales.  Abdominal:     General: Bowel sounds are normal. There is no  distension.     Palpations: Abdomen is soft. There is no mass.     Tenderness: There is no abdominal tenderness.  Musculoskeletal:        General: No swelling.     Right lower leg: No edema.     Left lower leg: No edema.  Lymphadenopathy:     Cervical: No cervical adenopathy.     Upper Body:     Right upper body: No supraclavicular or axillary adenopathy.     Left upper body: No supraclavicular or axillary adenopathy.     Lower Body: No right inguinal adenopathy. No left inguinal adenopathy.  Skin:    General: Skin is warm.     Coloration: Skin is not jaundiced.     Findings: No lesion or rash.  Neurological:     General: No focal deficit present.     Mental Status: He is alert and oriented to person, place, and time. Mental status is at baseline.  Psychiatric:        Mood and Affect: Mood normal.        Behavior: Behavior normal.        Thought Content: Thought content normal.     LABS:      Latest Ref Rng & Units 12/04/2023    9:52 AM 10/31/2023   12:07 PM 08/21/2023    2:32 PM  CBC  WBC 4.0 - 10.5 K/uL 6.1  4.3  5.1  Hemoglobin 13.0 - 17.0 g/dL 16.1  09.6  04.5   Hematocrit 39.0 - 52.0 % 40.3  36.2  33.4   Platelets 150 - 400 K/uL 98  77  92.0       Latest Ref Rng & Units 12/04/2023    9:52 AM 10/31/2023   12:07 PM 08/14/2023    6:31 AM  CMP  Glucose 70 - 99 mg/dL 409  811  914   BUN 8 - 23 mg/dL 22  17  13    Creatinine 0.61 - 1.24 mg/dL 7.82  9.56  2.13   Sodium 135 - 145 mmol/L 138  141  138   Potassium 3.5 - 5.1 mmol/L 4.3  4.4  3.5   Chloride 98 - 111 mmol/L 103  106  104   CO2 22 - 32 mmol/L 23  22  19    Calcium 8.9 - 10.3 mg/dL 9.3  9.0  8.2   Total Protein 6.5 - 8.1 g/dL 6.9  6.2    Total Bilirubin 0.0 - 1.2 mg/dL 0.8  0.7    Alkaline Phos 38 - 126 U/L 85  82    AST 15 - 41 U/L 19  19    ALT 0 - 44 U/L 16  16      Latest Reference Range & Units 10/31/23 12:07  Copper 69 - 132 ug/dL 9 (L)  Vitamin Y86 578 - 914 pg/mL 814  Zinc 44 - 115 ug/dL 469  (H)  (L): Data is abnormally low (H): Data is abnormally high  STUDIES: An abdominal ultrasound done on 12-02-2023 revealed the following:  FINDINGS: Liver has increased echogenicity with normal portal venous color Doppler blood flow. Gallbladder is contracted which limits evaluation. Suggestion of multiple gallstones in the lumen. CBD measures 4 mm. Spleen is 17.5 cm in length. Left-sided nephrectomy. Right kidney measures 11 cm in length containing multiple cysts. No free fluid is seen.  IMPRESSION: Hepatic steatosis. Gallstone without inflammation. Multiple right renal cysts. Left nephrectomy.  ASSESSMENT & PLAN:  Assessment/Plan:  A 78 y.o. male with mild thrombocytopenia and anemia.  When evaluating his labs today, his platelet count is better than what it was previously.  I am also pleased that his hemoglobin has improved.  I do believe the improvement in his hemoglobin is related to his recently given IV copper.  When talking to the patient about his copper deficiency, the patient claims he was taking zinc for numerous years, which likely led to his copper depleted state.  He no longer takes zinc and knows to remain off of this agent.  I do not believe his copper deficiency factored into his mild thrombocytopenia.  Copper is usually not associated with causing low platelets.  This gentleman's abdominal ultrasound done earlier this week shows him to have some degree of fatty liver disease and secondary splenomegaly.  This is the likely culprit behind his thrombocytopenia.  However, the gentleman has more than enough platelets to do the clotting his body needs.  Clinically, he is doing well.  I will see him back in 4 months for repeat clinical assessment.  The patient understands all the plans discussed today and is in agreement with them.    Caoilainn Sacks Kirby Funk, MD

## 2023-12-05 ENCOUNTER — Telehealth: Payer: Self-pay

## 2023-12-05 NOTE — Telephone Encounter (Signed)
ASSESSMENT & PLAN:  Assessment/Plan:  A 78 y.o. male with mild thrombocytopenia and anemia.  When evaluating his labs today, his platelet count is better than what it was previously.  I am also pleased that his hemoglobin has improved.  I do believe the improvement in his hemoglobin is related to his recently given IV copper.  When talking to the patient about his copper deficiency, the patient claims he was taking zinc for numerous years, which likely led to his copper depleted state.  He no longer takes zinc and knows to remain off of this agent.  I do not believe his copper deficiency factored into his mild thrombocytopenia.  Copper is usually not associated with causing low platelets.  This gentleman's abdominal ultrasound done earlier this week shows him to have some degree of fatty liver disease and secondary splenomegaly.  This is the likely culprit behind his thrombocytopenia.  However, the gentleman has more than enough platelets to do the clotting his body needs.  Clinically, he is doing well.  I will see him back in 4 months for repeat clinical assessment.  The patient understands all the plans discussed today and is in agreement with them.     Dequincy Kirby Funk, MD

## 2024-01-27 DIAGNOSIS — S01112A Laceration without foreign body of left eyelid and periocular area, initial encounter: Secondary | ICD-10-CM | POA: Diagnosis not present

## 2024-01-27 DIAGNOSIS — Z23 Encounter for immunization: Secondary | ICD-10-CM | POA: Diagnosis not present

## 2024-01-27 DIAGNOSIS — W19XXXA Unspecified fall, initial encounter: Secondary | ICD-10-CM | POA: Diagnosis not present

## 2024-01-30 DIAGNOSIS — M9905 Segmental and somatic dysfunction of pelvic region: Secondary | ICD-10-CM | POA: Diagnosis not present

## 2024-01-30 DIAGNOSIS — M9902 Segmental and somatic dysfunction of thoracic region: Secondary | ICD-10-CM | POA: Diagnosis not present

## 2024-01-30 DIAGNOSIS — M9903 Segmental and somatic dysfunction of lumbar region: Secondary | ICD-10-CM | POA: Diagnosis not present

## 2024-01-30 DIAGNOSIS — M51362 Other intervertebral disc degeneration, lumbar region with discogenic back pain and lower extremity pain: Secondary | ICD-10-CM | POA: Diagnosis not present

## 2024-02-02 DIAGNOSIS — M9905 Segmental and somatic dysfunction of pelvic region: Secondary | ICD-10-CM | POA: Diagnosis not present

## 2024-02-02 DIAGNOSIS — M51362 Other intervertebral disc degeneration, lumbar region with discogenic back pain and lower extremity pain: Secondary | ICD-10-CM | POA: Diagnosis not present

## 2024-02-02 DIAGNOSIS — M9902 Segmental and somatic dysfunction of thoracic region: Secondary | ICD-10-CM | POA: Diagnosis not present

## 2024-02-02 DIAGNOSIS — M9903 Segmental and somatic dysfunction of lumbar region: Secondary | ICD-10-CM | POA: Diagnosis not present

## 2024-02-04 DIAGNOSIS — M9905 Segmental and somatic dysfunction of pelvic region: Secondary | ICD-10-CM | POA: Diagnosis not present

## 2024-02-04 DIAGNOSIS — M9902 Segmental and somatic dysfunction of thoracic region: Secondary | ICD-10-CM | POA: Diagnosis not present

## 2024-02-04 DIAGNOSIS — M51362 Other intervertebral disc degeneration, lumbar region with discogenic back pain and lower extremity pain: Secondary | ICD-10-CM | POA: Diagnosis not present

## 2024-02-04 DIAGNOSIS — M9903 Segmental and somatic dysfunction of lumbar region: Secondary | ICD-10-CM | POA: Diagnosis not present

## 2024-02-06 DIAGNOSIS — M9905 Segmental and somatic dysfunction of pelvic region: Secondary | ICD-10-CM | POA: Diagnosis not present

## 2024-02-06 DIAGNOSIS — M9903 Segmental and somatic dysfunction of lumbar region: Secondary | ICD-10-CM | POA: Diagnosis not present

## 2024-02-06 DIAGNOSIS — M9902 Segmental and somatic dysfunction of thoracic region: Secondary | ICD-10-CM | POA: Diagnosis not present

## 2024-02-06 DIAGNOSIS — M51362 Other intervertebral disc degeneration, lumbar region with discogenic back pain and lower extremity pain: Secondary | ICD-10-CM | POA: Diagnosis not present

## 2024-02-09 DIAGNOSIS — M51362 Other intervertebral disc degeneration, lumbar region with discogenic back pain and lower extremity pain: Secondary | ICD-10-CM | POA: Diagnosis not present

## 2024-02-09 DIAGNOSIS — M9903 Segmental and somatic dysfunction of lumbar region: Secondary | ICD-10-CM | POA: Diagnosis not present

## 2024-02-09 DIAGNOSIS — M9902 Segmental and somatic dysfunction of thoracic region: Secondary | ICD-10-CM | POA: Diagnosis not present

## 2024-02-09 DIAGNOSIS — M9905 Segmental and somatic dysfunction of pelvic region: Secondary | ICD-10-CM | POA: Diagnosis not present

## 2024-02-11 DIAGNOSIS — M9902 Segmental and somatic dysfunction of thoracic region: Secondary | ICD-10-CM | POA: Diagnosis not present

## 2024-02-11 DIAGNOSIS — M9903 Segmental and somatic dysfunction of lumbar region: Secondary | ICD-10-CM | POA: Diagnosis not present

## 2024-02-11 DIAGNOSIS — M51362 Other intervertebral disc degeneration, lumbar region with discogenic back pain and lower extremity pain: Secondary | ICD-10-CM | POA: Diagnosis not present

## 2024-02-11 DIAGNOSIS — M9905 Segmental and somatic dysfunction of pelvic region: Secondary | ICD-10-CM | POA: Diagnosis not present

## 2024-02-13 DIAGNOSIS — M51362 Other intervertebral disc degeneration, lumbar region with discogenic back pain and lower extremity pain: Secondary | ICD-10-CM | POA: Diagnosis not present

## 2024-02-13 DIAGNOSIS — M9902 Segmental and somatic dysfunction of thoracic region: Secondary | ICD-10-CM | POA: Diagnosis not present

## 2024-02-13 DIAGNOSIS — M9903 Segmental and somatic dysfunction of lumbar region: Secondary | ICD-10-CM | POA: Diagnosis not present

## 2024-02-13 DIAGNOSIS — M9905 Segmental and somatic dysfunction of pelvic region: Secondary | ICD-10-CM | POA: Diagnosis not present

## 2024-02-16 DIAGNOSIS — M51362 Other intervertebral disc degeneration, lumbar region with discogenic back pain and lower extremity pain: Secondary | ICD-10-CM | POA: Diagnosis not present

## 2024-02-16 DIAGNOSIS — M9903 Segmental and somatic dysfunction of lumbar region: Secondary | ICD-10-CM | POA: Diagnosis not present

## 2024-02-16 DIAGNOSIS — M9905 Segmental and somatic dysfunction of pelvic region: Secondary | ICD-10-CM | POA: Diagnosis not present

## 2024-02-16 DIAGNOSIS — M9902 Segmental and somatic dysfunction of thoracic region: Secondary | ICD-10-CM | POA: Diagnosis not present

## 2024-02-18 DIAGNOSIS — M9903 Segmental and somatic dysfunction of lumbar region: Secondary | ICD-10-CM | POA: Diagnosis not present

## 2024-02-18 DIAGNOSIS — M9905 Segmental and somatic dysfunction of pelvic region: Secondary | ICD-10-CM | POA: Diagnosis not present

## 2024-02-18 DIAGNOSIS — M51362 Other intervertebral disc degeneration, lumbar region with discogenic back pain and lower extremity pain: Secondary | ICD-10-CM | POA: Diagnosis not present

## 2024-02-18 DIAGNOSIS — M9902 Segmental and somatic dysfunction of thoracic region: Secondary | ICD-10-CM | POA: Diagnosis not present

## 2024-02-20 DIAGNOSIS — M9903 Segmental and somatic dysfunction of lumbar region: Secondary | ICD-10-CM | POA: Diagnosis not present

## 2024-02-20 DIAGNOSIS — M9905 Segmental and somatic dysfunction of pelvic region: Secondary | ICD-10-CM | POA: Diagnosis not present

## 2024-02-20 DIAGNOSIS — M51362 Other intervertebral disc degeneration, lumbar region with discogenic back pain and lower extremity pain: Secondary | ICD-10-CM | POA: Diagnosis not present

## 2024-02-20 DIAGNOSIS — M9902 Segmental and somatic dysfunction of thoracic region: Secondary | ICD-10-CM | POA: Diagnosis not present

## 2024-02-23 ENCOUNTER — Telehealth: Payer: Self-pay | Admitting: *Deleted

## 2024-02-23 ENCOUNTER — Other Ambulatory Visit: Payer: Self-pay | Admitting: Surgical

## 2024-02-23 DIAGNOSIS — M9902 Segmental and somatic dysfunction of thoracic region: Secondary | ICD-10-CM | POA: Diagnosis not present

## 2024-02-23 DIAGNOSIS — M9903 Segmental and somatic dysfunction of lumbar region: Secondary | ICD-10-CM | POA: Diagnosis not present

## 2024-02-23 DIAGNOSIS — M9905 Segmental and somatic dysfunction of pelvic region: Secondary | ICD-10-CM | POA: Diagnosis not present

## 2024-02-23 DIAGNOSIS — M51362 Other intervertebral disc degeneration, lumbar region with discogenic back pain and lower extremity pain: Secondary | ICD-10-CM | POA: Diagnosis not present

## 2024-02-23 MED ORDER — AMOXICILLIN 500 MG PO TABS: 2000.0000 mg | ORAL_TABLET | Freq: Once | ORAL | 0 refills | Status: AC

## 2024-02-23 NOTE — Telephone Encounter (Signed)
 1 year Ortho bundle call to patient.

## 2024-02-23 NOTE — Telephone Encounter (Signed)
 Patient called and asked about an antibiotic prior to his dentist appt in May. Could you send in. Thank you.

## 2024-02-23 NOTE — Telephone Encounter (Signed)
Sent in antibiotics.

## 2024-02-25 DIAGNOSIS — M9905 Segmental and somatic dysfunction of pelvic region: Secondary | ICD-10-CM | POA: Diagnosis not present

## 2024-02-25 DIAGNOSIS — M9903 Segmental and somatic dysfunction of lumbar region: Secondary | ICD-10-CM | POA: Diagnosis not present

## 2024-02-25 DIAGNOSIS — M51362 Other intervertebral disc degeneration, lumbar region with discogenic back pain and lower extremity pain: Secondary | ICD-10-CM | POA: Diagnosis not present

## 2024-02-25 DIAGNOSIS — M9902 Segmental and somatic dysfunction of thoracic region: Secondary | ICD-10-CM | POA: Diagnosis not present

## 2024-02-27 DIAGNOSIS — M9903 Segmental and somatic dysfunction of lumbar region: Secondary | ICD-10-CM | POA: Diagnosis not present

## 2024-02-27 DIAGNOSIS — M9905 Segmental and somatic dysfunction of pelvic region: Secondary | ICD-10-CM | POA: Diagnosis not present

## 2024-02-27 DIAGNOSIS — M9902 Segmental and somatic dysfunction of thoracic region: Secondary | ICD-10-CM | POA: Diagnosis not present

## 2024-02-27 DIAGNOSIS — M51362 Other intervertebral disc degeneration, lumbar region with discogenic back pain and lower extremity pain: Secondary | ICD-10-CM | POA: Diagnosis not present

## 2024-03-01 DIAGNOSIS — M9903 Segmental and somatic dysfunction of lumbar region: Secondary | ICD-10-CM | POA: Diagnosis not present

## 2024-03-01 DIAGNOSIS — M9905 Segmental and somatic dysfunction of pelvic region: Secondary | ICD-10-CM | POA: Diagnosis not present

## 2024-03-01 DIAGNOSIS — M9902 Segmental and somatic dysfunction of thoracic region: Secondary | ICD-10-CM | POA: Diagnosis not present

## 2024-03-01 DIAGNOSIS — M51362 Other intervertebral disc degeneration, lumbar region with discogenic back pain and lower extremity pain: Secondary | ICD-10-CM | POA: Diagnosis not present

## 2024-03-03 DIAGNOSIS — M9905 Segmental and somatic dysfunction of pelvic region: Secondary | ICD-10-CM | POA: Diagnosis not present

## 2024-03-03 DIAGNOSIS — M9903 Segmental and somatic dysfunction of lumbar region: Secondary | ICD-10-CM | POA: Diagnosis not present

## 2024-03-03 DIAGNOSIS — M9902 Segmental and somatic dysfunction of thoracic region: Secondary | ICD-10-CM | POA: Diagnosis not present

## 2024-03-03 DIAGNOSIS — M51362 Other intervertebral disc degeneration, lumbar region with discogenic back pain and lower extremity pain: Secondary | ICD-10-CM | POA: Diagnosis not present

## 2024-03-05 DIAGNOSIS — M9905 Segmental and somatic dysfunction of pelvic region: Secondary | ICD-10-CM | POA: Diagnosis not present

## 2024-03-05 DIAGNOSIS — M51362 Other intervertebral disc degeneration, lumbar region with discogenic back pain and lower extremity pain: Secondary | ICD-10-CM | POA: Diagnosis not present

## 2024-03-05 DIAGNOSIS — M9902 Segmental and somatic dysfunction of thoracic region: Secondary | ICD-10-CM | POA: Diagnosis not present

## 2024-03-05 DIAGNOSIS — M9903 Segmental and somatic dysfunction of lumbar region: Secondary | ICD-10-CM | POA: Diagnosis not present

## 2024-03-15 ENCOUNTER — Encounter (HOSPITAL_BASED_OUTPATIENT_CLINIC_OR_DEPARTMENT_OTHER): Payer: Self-pay

## 2024-03-16 ENCOUNTER — Ambulatory Visit: Payer: PPO | Attending: Cardiology | Admitting: Cardiology

## 2024-03-16 ENCOUNTER — Encounter: Payer: Self-pay | Admitting: Cardiology

## 2024-03-16 VITALS — BP 102/68 | HR 58 | Ht 71.0 in | Wt 195.0 lb

## 2024-03-16 DIAGNOSIS — I10 Essential (primary) hypertension: Secondary | ICD-10-CM | POA: Diagnosis not present

## 2024-03-16 DIAGNOSIS — R931 Abnormal findings on diagnostic imaging of heart and coronary circulation: Secondary | ICD-10-CM

## 2024-03-16 DIAGNOSIS — I2510 Atherosclerotic heart disease of native coronary artery without angina pectoris: Secondary | ICD-10-CM

## 2024-03-16 DIAGNOSIS — E785 Hyperlipidemia, unspecified: Secondary | ICD-10-CM

## 2024-03-16 DIAGNOSIS — N1832 Chronic kidney disease, stage 3b: Secondary | ICD-10-CM | POA: Diagnosis not present

## 2024-03-16 NOTE — Patient Instructions (Signed)

## 2024-03-16 NOTE — Progress Notes (Signed)
 Cardiology Office Note:  .   Date:  03/24/2024  ID:  Eric Suarez, DOB 1946-10-19, MRN [id number] PCP: [name], MD  [name] Providers Cardiologist:  [name], MD     Chief Complaint  Patient presents with   Follow-up    32-month follow-up.  Doing well.  No angina   Coronary Artery Disease    No angina    Patient Profile: .     [name] is a pleasant 78 y.o. male who presents here for 9 month f/u at the request of [name], MD.  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   [name] was last seen on June 27, 2023 following right knee replacement surgery.  He was doing very well, and contemplating having his left knee done.  He had noted having several falls because of not picking up his feet.  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  Limited mostly by back pain at more than dyspnea.  Able to achieve at least 4 if not 8 METS.  No changes made.  Plan for 34-month follow-up  Subjective  Discussed the use of AI scribe software for clinical note transcription with the patient, who gave verbal consent to proceed.  History of Present Illness History of Present Illness Eric Suarez is a 78 year old male with coronary artery disease who presents for a six-month follow-up.  He has a history of coronary artery disease with an elevated coronary calcium  score and moderate disease in the left main artery. Since the last visit, he has not experienced any chest pain, pressure, or tightness at rest or with exertion.  No exertional dyspnea beyond baseline.  He denies any PND, orthopnea edema.  No rapid irregular heartbeats or palpitations.  No syncope/near syncope or TIA/amaurosis fugax.  No  claudication.  No melena, hematochezia hematuria or epistaxis.  He has a history of knee surgery and continues to experience issues with his left knee and right hip, which affect his mobility. He describes a sensation of his hip 'trying to jump out', which he attributes to an old basketball injury from high school. He sees a [facility] for hip pain management.  His cholesterol levels, last checked in November, showed a total cholesterol of 107, triglycerides of 133, LDL of 57, and HDL of 26. He is currently taking Zetia  10 mg for cholesterol management. He underwent blood tests after low copper  levels were identified due to zinc  intake, leading to five infusions at a cancer center. This was initially identified during a physical when low platelets were noted, prompting a referral to an oncologist.  He reports experiencing loose stools and diarrhea, which he associates with dietary intake, particularly sweets. A past colonoscopy revealed polyps that were cauterized, but subsequent bleeding required a repeat procedure. His bowel issues have worsened since then.  Current medications include atenolol , chlorthalidone , Zetia , baby aspirin , Synthroid  125 mcg, Prilosec, Paxil  25 mg, potassium supplement every other day, Avodart  0.5 mg, gabapentin  400 mg twice a day, and a pain medication referred to as uratexol.   Objective   Medications - Atenolol - Chlorthalidone  25 mg-25 mg => 1/2 tab daily - Zetia  10 mg - Baby aspirin  - Synthroid  125 mcg -  Prilosec 20 mg nightly - Paxil  25 mg - Potassium supplement 20 mEq every other day - Avodart  0.5 mg - Gabapentin  400 mg twice a day - Uroxatral  10 mg daily  Studies Reviewed: Aaron Aas   EKG Interpretation Date/Time:  Tuesday Mar 16 2024 10:05:10 EDT Ventricular Rate:  53 PR Interval:  184 QRS Duration:  90 QT Interval:  442 QTC Calculation: 414 R Axis:   -39  Text Interpretation: Sinus bradycardia Left axis deviation When compared with ECG of 27-Jun-2023  10:10, No significant change was found Confirmed by [name] ([id number]) on 03/16/2024 10:27:52 AM    No new studies  No results found for: "CHOL", "HDL", "LDLCALC", "LDLDIRECT", "TRIG", "CHOLHDL"  LABS Total cholesterol: 107 mg/dL (82/95/6213) Triglycerides: 133 mg/dL (08/65/7846) LDL: 57 mg/dL (96/29/5284) HDL: 26 mg/dL (13/24/4010) UVO5D: 6.6% (10/08/2023) Hgb 13.4, CR 1.64, K+ 4.3 (12/04/2023)  Risk Assessment/Calculations:        Physical Exam:   VS:  BP 102/68   Pulse (!) 58   Ht 5\' 11"  (1.803 m)   Wt 195 lb (88.5 kg)   SpO2 98%   BMI 27.20 kg/m    Wt Readings from Last 3 Encounters:  03/16/24 195 lb (88.5 kg)  12/04/23 201 lb 11.2 oz (91.5 kg)  10/31/23 205 lb 11.2 oz (93.3 kg)    GEN: Well nourished, well groomed in no acute distress; healthy-appearing. NECK: No JVD; No carotid bruits CARDIAC: Normal S1, S2; RRR, no murmurs, rubs, gallops RESPIRATORY:  Clear to auscultation without rales, wheezing or rhonchi ; nonlabored, good air movement. ABDOMEN: Soft, non-tender, non-distended EXTREMITIES:  No edema; No deformity     ASSESSMENT AND PLAN: .    Problem List Items Addressed This Visit       Cardiology Problems   Agatston coronary artery calcium  score greater than 400 (Chronic)   Aggressive risk factor modification as noted in LAD section      Essential hypertension (Chronic)   Blood pressure well-controlled with current medication regimen. - Continue atenolol  - chlorthalidone .  (Equivalent dose 25-12.5 mg daily) - Monitor blood pressure regularly.      Relevant Orders   EKG 12-Lead (Completed)   Hyperlipidemia LDL goal <70 (Chronic)   With chronic Score over 500 notably in left main, target LDL at least less than 70 if not less than 55.  Most recent LDL was 57 on Zetia  10 mg daily. - Continue Zetia  10 mg daily - Labs followed by PCP.  Hemoglobin A1c is 5.7, indicating pre-diabetes. Currently stable. - Encourage lifestyle modifications, including  dietary changes and exercise.      Relevant Orders   EKG 12-Lead (Completed)   Left main coronary artery disease - Primary (Chronic)   Coronary artery disease with elevated coronary calcium  score over 500, indicating moderate disease in the Left Main artery.  Doing well with no active anginal symptoms.  Plan for medical management only. Managed as post-myocardial infarction to prevent future events.  Further testing not indicated unless symptoms develop. - Continue Zetia  10 mg daily for lipid management - Maintain blood pressure control with atenolol  -chlorthalidone  (50-25 => 1/2 tab daily). - Monitor for symptoms such as chest pain, dyspnea, or changes in exercise tolerance. - On maintenance aspirin  81 mg daily => okay to hold aspirin  5 days preop surgeries or procedures. - Educated on lifestyle modifications, including exercise, to improve HDL levels. - Discussed potential need for bypass surgery if symptoms develop.        Other   CKD (chronic  kidney disease) stage 3, GFR 30-59 ml/min (HCC) (Chronic)   Follow closely by PCP.  Most recent creatinine was 1.64. No current issues.       Hypothyroidism Managed with Synthroid  125 mcg daily. No new symptoms reported. - Continue Synthroid  125 mcg daily.  Chronic pain due to neuralgia Chronic neuralgia managed with gabapentin . Reports nerve pain in the upper body. - Continue gabapentin  400 mg twice daily.  Gastroesophageal reflux disease (GERD) Managed with daily Prilosec. No new symptoms reported. - Continue Prilosec daily.  Benign prostatic hyperplasia Managed with Avodart  0.5 mg daily. No new symptoms reported. - Continue Avodart  0.5 mg daily.  Colon polyps History of colon polyps with previous complications post-colonoscopy. Reports loose stools and diarrhea. - Consider dietary adjustments to manage loose stools, including increased fiber intake.    Follow-Up: Return in about 1 year (around 03/16/2025) for [employer], Routine follow up with me or APP.     Signed, Arleen Lacer, MD, MS [name], M.D., M.S. Interventional Chartered certified accountant  Pager # [phone]

## 2024-03-24 ENCOUNTER — Encounter: Payer: Self-pay | Admitting: Cardiology

## 2024-03-24 NOTE — Assessment & Plan Note (Signed)
 Follow closely by PCP.  Most recent creatinine was 1.64. No current issues.

## 2024-03-24 NOTE — Assessment & Plan Note (Signed)
 With chronic Score over 500 notably in left main, target LDL at least less than 70 if not less than 55.  Most recent LDL was 57 on Zetia  10 mg daily. - Continue Zetia  10 mg daily - Labs followed by PCP.  Hemoglobin A1c is 5.7, indicating pre-diabetes. Currently stable. - Encourage lifestyle modifications, including dietary changes and exercise.

## 2024-03-24 NOTE — Assessment & Plan Note (Signed)
 Blood pressure well-controlled with current medication regimen. - Continue atenolol  - chlorthalidone .  (Equivalent dose 25-12.5 mg daily) - Monitor blood pressure regularly.

## 2024-03-24 NOTE — Assessment & Plan Note (Signed)
 Coronary artery disease with elevated coronary calcium  score over 500, indicating moderate disease in the Left Main artery.  Doing well with no active anginal symptoms.  Plan for medical management only. Managed as post-myocardial infarction to prevent future events.  Further testing not indicated unless symptoms develop. - Continue Zetia  10 mg daily for lipid management - Maintain blood pressure control with atenolol  -chlorthalidone  (50-25 => 1/2 tab daily). - Monitor for symptoms such as chest pain, dyspnea, or changes in exercise tolerance. - On maintenance aspirin  81 mg daily => okay to hold aspirin  5 days preop surgeries or procedures. - Educated on lifestyle modifications, including exercise, to improve HDL levels. - Discussed potential need for bypass surgery if symptoms develop.

## 2024-03-24 NOTE — Assessment & Plan Note (Signed)
 Aggressive risk factor modification as noted in LAD section

## 2024-03-29 ENCOUNTER — Other Ambulatory Visit: Payer: PPO

## 2024-03-29 ENCOUNTER — Inpatient Hospital Stay: Payer: PPO | Attending: Oncology

## 2024-03-29 DIAGNOSIS — D649 Anemia, unspecified: Secondary | ICD-10-CM | POA: Insufficient documentation

## 2024-03-29 DIAGNOSIS — H25813 Combined forms of age-related cataract, bilateral: Secondary | ICD-10-CM | POA: Diagnosis not present

## 2024-03-29 DIAGNOSIS — D696 Thrombocytopenia, unspecified: Secondary | ICD-10-CM | POA: Diagnosis not present

## 2024-03-29 DIAGNOSIS — Z79899 Other long term (current) drug therapy: Secondary | ICD-10-CM | POA: Diagnosis not present

## 2024-03-29 DIAGNOSIS — K760 Fatty (change of) liver, not elsewhere classified: Secondary | ICD-10-CM | POA: Diagnosis not present

## 2024-03-29 DIAGNOSIS — H353131 Nonexudative age-related macular degeneration, bilateral, early dry stage: Secondary | ICD-10-CM | POA: Diagnosis not present

## 2024-03-29 DIAGNOSIS — E610 Copper deficiency: Secondary | ICD-10-CM | POA: Diagnosis not present

## 2024-03-29 LAB — CMP (CANCER CENTER ONLY)
ALT: 14 U/L (ref 0–44)
AST: 16 U/L (ref 15–41)
Albumin: 4.1 g/dL (ref 3.5–5.0)
Alkaline Phosphatase: 77 U/L (ref 38–126)
Anion gap: 11 (ref 5–15)
BUN: 23 mg/dL (ref 8–23)
CO2: 22 mmol/L (ref 22–32)
Calcium: 9.6 mg/dL (ref 8.9–10.3)
Chloride: 108 mmol/L (ref 98–111)
Creatinine: 1.44 mg/dL — ABNORMAL HIGH (ref 0.61–1.24)
GFR, Estimated: 50 mL/min — ABNORMAL LOW (ref >60)
Glucose, Bld: 73 mg/dL (ref 70–99)
Potassium: 4.5 mmol/L (ref 3.5–5.1)
Sodium: 140 mmol/L (ref 135–145)
Total Bilirubin: 0.7 mg/dL (ref 0.0–1.2)
Total Protein: 6.5 g/dL (ref 6.5–8.1)

## 2024-03-29 LAB — CBC WITH DIFFERENTIAL (CANCER CENTER ONLY)
Abs Immature Granulocytes: 0.01 10*3/uL (ref 0.00–0.07)
Basophils Absolute: 0 10*3/uL (ref 0.0–0.1)
Basophils Relative: 0 %
Eosinophils Absolute: 0 10*3/uL (ref 0.0–0.5)
Eosinophils Relative: 1 %
HCT: 38.9 % — ABNORMAL LOW (ref 39.0–52.0)
Hemoglobin: 13 g/dL (ref 13.0–17.0)
Immature Granulocytes: 0 %
Immature Platelet Fraction: 4.5 % (ref 1.2–8.6)
Lymphocytes Relative: 31 %
Lymphs Abs: 1.5 10*3/uL (ref 0.7–4.0)
MCH: 29.5 pg (ref 26.0–34.0)
MCHC: 33.4 g/dL (ref 30.0–36.0)
MCV: 88.2 fL (ref 80.0–100.0)
Monocytes Absolute: 0.3 10*3/uL (ref 0.1–1.0)
Monocytes Relative: 7 %
Neutro Abs: 2.9 10*3/uL (ref 1.7–7.7)
Neutrophils Relative %: 61 %
Platelet Count: 74 10*3/uL — ABNORMAL LOW (ref 150–400)
RBC: 4.41 MIL/uL (ref 4.22–5.81)
RDW: 15.8 % — ABNORMAL HIGH (ref 11.5–15.5)
WBC Count: 4.8 10*3/uL (ref 4.0–10.5)
nRBC: 0 % (ref 0.0–0.2)

## 2024-03-30 ENCOUNTER — Ambulatory Visit: Payer: PPO | Admitting: Oncology

## 2024-03-31 LAB — COPPER, SERUM: Copper: 45 ug/dL — ABNORMAL LOW (ref 69–132)

## 2024-04-01 DIAGNOSIS — M9902 Segmental and somatic dysfunction of thoracic region: Secondary | ICD-10-CM | POA: Diagnosis not present

## 2024-04-01 DIAGNOSIS — M51362 Other intervertebral disc degeneration, lumbar region with discogenic back pain and lower extremity pain: Secondary | ICD-10-CM | POA: Diagnosis not present

## 2024-04-01 DIAGNOSIS — M9903 Segmental and somatic dysfunction of lumbar region: Secondary | ICD-10-CM | POA: Diagnosis not present

## 2024-04-01 DIAGNOSIS — M9905 Segmental and somatic dysfunction of pelvic region: Secondary | ICD-10-CM | POA: Diagnosis not present

## 2024-04-01 NOTE — Progress Notes (Signed)
 [facility]  [address] [place],  [place]  [address] [phone]  Clinic Day:  04/02/2024  Referring physician: [name], MD   HISTORY OF PRESENT ILLNESS:  The patient is a 78 y.o. male with thrombocytopenia.  Labs have also shown him to be mildly anemic.  Of note, a battery of labs done at his initial visit showed his copper  level to be extremely low.  This led to him receiving IV copper , for which he tolerated very well.  He comes in today to reassess his peripheral counts.  Since his last visit, the patient has been doing fairly well.  With respect to his thrombocytopenia, he continues to deny having any subcutaneous bleeding/bruising issues which concern him for having severely low platelets.  Per hospital records, this gentleman has had borderline thrombocytopenia dating back to at least 2018.  He also recently had an abdominal ultrasound that suggested fatty liver disease and an enlarged spleen.  PHYSICAL EXAM:  Blood pressure 125/88, pulse (!) 57, temperature (!) 97.5 F (36.4 C), temperature source Oral, resp. rate 16, height 5\' 11"  (1.803 m), weight 194 lb 11.2 oz (88.3 kg), SpO2 95%. Wt Readings from Last 3 Encounters:  04/02/24 194 lb 11.2 oz (88.3 kg)  03/16/24 195 lb (88.5 kg)  12/04/23 201 lb 11.2 oz (91.5 kg)   Body mass index is 27.16 kg/m. Performance status (ECOG): 1 - Symptomatic but completely ambulatory Physical Exam Constitutional:      Appearance: Normal appearance. He is not ill-appearing.  HENT:     Mouth/Throat:     Mouth: Mucous membranes are moist.     Pharynx: Oropharynx is clear. No oropharyngeal exudate or posterior oropharyngeal erythema.  Cardiovascular:     Rate and Rhythm: Normal rate and regular rhythm.     Heart sounds: No murmur heard.    No friction rub. No gallop.  Pulmonary:     Effort: Pulmonary effort is normal. No respiratory distress.     Breath sounds: Normal breath sounds. No wheezing,  rhonchi or rales.  Abdominal:     General: Bowel sounds are normal. There is no distension.     Palpations: Abdomen is soft. There is no mass.     Tenderness: There is no abdominal tenderness.  Musculoskeletal:        General: No swelling.     Right lower leg: No edema.     Left lower leg: No edema.  Lymphadenopathy:     Cervical: No cervical adenopathy.     Upper Body:     Right upper body: No supraclavicular or axillary adenopathy.     Left upper body: No supraclavicular or axillary adenopathy.     Lower Body: No right inguinal adenopathy. No left inguinal adenopathy.  Skin:    General: Skin is warm.     Coloration: Skin is not jaundiced.     Findings: No lesion or rash.  Neurological:     General: No focal deficit present.     Mental Status: He is alert and oriented to person, place, and time. Mental status is at baseline.  Psychiatric:        Mood and Affect: Mood normal.        Behavior: Behavior normal.        Thought Content: Thought content normal.     LABS:      Latest Ref Rng & Units 03/29/2024    2:32 PM 12/04/2023    9:52 AM 10/31/2023   12:07  PM  CBC  WBC 4.0 - 10.5 K/uL 4.8  6.1  4.3   Hemoglobin 13.0 - 17.0 g/dL 24.4  01.0  27.2   Hematocrit 39.0 - 52.0 % 38.9  40.3  36.2   Platelets 150 - 400 K/uL 74  98  77       Latest Ref Rng & Units 03/29/2024    2:32 PM 12/04/2023    9:52 AM 10/31/2023   12:07 PM  CMP  Glucose 70 - 99 mg/dL 73  536  644   BUN 8 - 23 mg/dL 23  22  17    Creatinine 0.61 - 1.24 mg/dL 0.34  7.42  5.95   Sodium 135 - 145 mmol/L 140  138  141   Potassium 3.5 - 5.1 mmol/L 4.5  4.3  4.4   Chloride 98 - 111 mmol/L 108  103  106   CO2 22 - 32 mmol/L 22  23  22    Calcium  8.9 - 10.3 mg/dL 9.6  9.3  9.0   Total Protein 6.5 - 8.1 g/dL 6.5  6.9  6.2   Total Bilirubin 0.0 - 1.2 mg/dL 0.7  0.8  0.7   Alkaline Phos 38 - 126 U/L 77  85  82   AST 15 - 41 U/L 16  19  19    ALT 0 - 44 U/L 14  16  16      Latest Reference Range & Units 10/31/23  12:07 03/29/24 14:32  Copper  69 - 132 ug/dL 9 (L) 45 (L)  (L): Data is abnormally low  ASSESSMENT & PLAN:  Assessment/Plan:  A 78 y.o. male with known mild thrombocytopenia and anemia.  When evaluating his labs today, his platelet count is lower than what it was previously.  An abdominal ultrasound done earlier this year showed fatty liver disease and possible splenomegaly.  For completeness, a CT scan of his abdomen/pelvis will be done before his next visit to better depict his liver and splenic architecture.  I am pleased that his hemoglobin is now normal.  However, as his copper  level is still low, a repeat 5-day course of IV copper  will be given over these next weeks.  He understands his copper  deficiency is due to him having taken zinc  on a daily basis for numerous years.  He no longer takes zinc  and knows to remain off of this agent.  Overall, the patient appears to be doing fairly well.  I will see him back in 4 months to reassess his peripheral counts, as well as to go over his CT scan images. The patient understands all the plans discussed today and is in agreement with them.    Eric Boye Felicia Horde, MD

## 2024-04-02 ENCOUNTER — Inpatient Hospital Stay: Payer: PPO | Admitting: Oncology

## 2024-04-02 ENCOUNTER — Telehealth: Payer: Self-pay | Admitting: Oncology

## 2024-04-02 ENCOUNTER — Other Ambulatory Visit: Payer: Self-pay | Admitting: Oncology

## 2024-04-02 VITALS — BP 125/88 | HR 57 | Temp 97.5°F | Resp 16 | Ht 71.0 in | Wt 194.7 lb

## 2024-04-02 DIAGNOSIS — D696 Thrombocytopenia, unspecified: Secondary | ICD-10-CM

## 2024-04-02 NOTE — Telephone Encounter (Signed)
 Contacted pt to schedule an appt. Unable to reach via phone, voicemail was left.     Scheduling Message Entered by Deretha Fleck on 04/02/2024 at 10:04 AM Priority: Routine <No visit type provided>  Department: [facility]  Provider:  Appointment Notes:  Please schedule pt for 5 doses of copper  infusions.  Usually Monday through Friday but we can start next week if pt prefers.  Needs 2.5 hour infusion appt each time.

## 2024-04-02 NOTE — Telephone Encounter (Signed)
 Patient has been scheduled for follow-up visit per 03/31/24 LOS.  Pt given an appt calendar with date and time.

## 2024-04-12 ENCOUNTER — Ambulatory Visit

## 2024-04-13 ENCOUNTER — Ambulatory Visit

## 2024-04-13 DIAGNOSIS — K432 Incisional hernia without obstruction or gangrene: Secondary | ICD-10-CM | POA: Diagnosis not present

## 2024-04-13 DIAGNOSIS — K4091 Unilateral inguinal hernia, without obstruction or gangrene, recurrent: Secondary | ICD-10-CM | POA: Diagnosis not present

## 2024-04-14 ENCOUNTER — Ambulatory Visit

## 2024-04-15 ENCOUNTER — Ambulatory Visit

## 2024-04-15 DIAGNOSIS — R972 Elevated prostate specific antigen [PSA]: Secondary | ICD-10-CM | POA: Diagnosis not present

## 2024-04-16 ENCOUNTER — Ambulatory Visit

## 2024-04-21 DIAGNOSIS — K432 Incisional hernia without obstruction or gangrene: Secondary | ICD-10-CM | POA: Diagnosis not present

## 2024-04-21 DIAGNOSIS — K4091 Unilateral inguinal hernia, without obstruction or gangrene, recurrent: Secondary | ICD-10-CM | POA: Diagnosis not present

## 2024-04-22 DIAGNOSIS — R351 Nocturia: Secondary | ICD-10-CM | POA: Diagnosis not present

## 2024-04-22 DIAGNOSIS — N401 Enlarged prostate with lower urinary tract symptoms: Secondary | ICD-10-CM | POA: Diagnosis not present

## 2024-04-22 DIAGNOSIS — Z85528 Personal history of other malignant neoplasm of kidney: Secondary | ICD-10-CM | POA: Diagnosis not present

## 2024-04-22 DIAGNOSIS — N210 Calculus in bladder: Secondary | ICD-10-CM | POA: Diagnosis not present

## 2024-04-26 ENCOUNTER — Inpatient Hospital Stay: Attending: Oncology

## 2024-04-26 VITALS — BP 117/64 | HR 49 | Temp 97.7°F | Resp 16

## 2024-04-26 DIAGNOSIS — D649 Anemia, unspecified: Secondary | ICD-10-CM | POA: Insufficient documentation

## 2024-04-26 DIAGNOSIS — E610 Copper deficiency: Secondary | ICD-10-CM | POA: Insufficient documentation

## 2024-04-26 DIAGNOSIS — Z79899 Other long term (current) drug therapy: Secondary | ICD-10-CM | POA: Diagnosis not present

## 2024-04-26 DIAGNOSIS — D696 Thrombocytopenia, unspecified: Secondary | ICD-10-CM | POA: Insufficient documentation

## 2024-04-26 MED ORDER — ONDANSETRON HCL 4 MG/2ML IJ SOLN
8.0000 mg | Freq: Once | INTRAMUSCULAR | Status: AC
  Administered 2024-04-26: 8 mg via INTRAVENOUS
  Filled 2024-04-26: qty 4

## 2024-04-26 MED ORDER — LORATADINE 10 MG PO TABS
10.0000 mg | ORAL_TABLET | Freq: Once | ORAL | Status: AC
  Administered 2024-04-26: 10 mg via ORAL
  Filled 2024-04-26: qty 1

## 2024-04-26 MED ORDER — SODIUM CHLORIDE 0.9 % IV SOLN: Freq: Once | INTRAVENOUS | Status: AC

## 2024-04-26 MED ORDER — SODIUM CHLORIDE 0.9 % IV SOLN
2.0000 mg | Freq: Every day | INTRAVENOUS | Status: DC
  Administered 2024-04-26: 2 mg via INTRAVENOUS
  Filled 2024-04-26: qty 5

## 2024-04-27 ENCOUNTER — Inpatient Hospital Stay

## 2024-04-27 VITALS — BP 121/63 | HR 50 | Temp 97.7°F | Resp 16

## 2024-04-27 DIAGNOSIS — D696 Thrombocytopenia, unspecified: Secondary | ICD-10-CM | POA: Diagnosis not present

## 2024-04-27 DIAGNOSIS — E610 Copper deficiency: Secondary | ICD-10-CM

## 2024-04-27 MED ORDER — ONDANSETRON HCL 4 MG/2ML IJ SOLN
8.0000 mg | Freq: Once | INTRAMUSCULAR | Status: AC
  Administered 2024-04-27: 8 mg via INTRAVENOUS
  Filled 2024-04-27: qty 4

## 2024-04-27 MED ORDER — LORATADINE 10 MG PO TABS
10.0000 mg | ORAL_TABLET | Freq: Every day | ORAL | Status: DC
  Administered 2024-04-27: 10 mg via ORAL
  Filled 2024-04-27: qty 1

## 2024-04-27 MED ORDER — SODIUM CHLORIDE 0.9 % IV SOLN: Freq: Once | INTRAVENOUS | Status: AC

## 2024-04-27 MED ORDER — SODIUM CHLORIDE 0.9 % IV SOLN
2.0000 mg | Freq: Every day | INTRAVENOUS | Status: DC
  Administered 2024-04-27: 2 mg via INTRAVENOUS
  Filled 2024-04-27: qty 5

## 2024-04-28 ENCOUNTER — Inpatient Hospital Stay

## 2024-04-28 VITALS — BP 114/71 | HR 50 | Temp 98.7°F | Resp 16

## 2024-04-28 DIAGNOSIS — E610 Copper deficiency: Secondary | ICD-10-CM

## 2024-04-28 DIAGNOSIS — D696 Thrombocytopenia, unspecified: Secondary | ICD-10-CM | POA: Diagnosis not present

## 2024-04-28 MED ORDER — SODIUM CHLORIDE 0.9 % IV SOLN: Freq: Once | INTRAVENOUS | Status: AC

## 2024-04-28 MED ORDER — LORATADINE 10 MG PO TABS
10.0000 mg | ORAL_TABLET | Freq: Every day | ORAL | Status: DC
  Administered 2024-04-28: 10 mg via ORAL
  Filled 2024-04-28 (×2): qty 1

## 2024-04-28 MED ORDER — ONDANSETRON HCL 4 MG/2ML IJ SOLN
8.0000 mg | Freq: Once | INTRAMUSCULAR | Status: AC
  Administered 2024-04-28: 8 mg via INTRAVENOUS
  Filled 2024-04-28: qty 4

## 2024-04-28 MED ORDER — SODIUM CHLORIDE 0.9 % IV SOLN
2.0000 mg | Freq: Every day | INTRAVENOUS | Status: DC
  Administered 2024-04-28: 2 mg via INTRAVENOUS
  Filled 2024-04-28: qty 5

## 2024-04-29 ENCOUNTER — Inpatient Hospital Stay

## 2024-04-29 VITALS — BP 117/73 | HR 62 | Temp 98.0°F | Resp 18

## 2024-04-29 DIAGNOSIS — D696 Thrombocytopenia, unspecified: Secondary | ICD-10-CM | POA: Diagnosis not present

## 2024-04-29 DIAGNOSIS — E610 Copper deficiency: Secondary | ICD-10-CM

## 2024-04-29 MED ORDER — LORATADINE 10 MG PO TABS
10.0000 mg | ORAL_TABLET | Freq: Once | ORAL | Status: AC
  Administered 2024-04-29: 10 mg via ORAL
  Filled 2024-04-29: qty 1

## 2024-04-29 MED ORDER — SODIUM CHLORIDE 0.9 % IV SOLN: Freq: Once | INTRAVENOUS | Status: AC

## 2024-04-29 MED ORDER — SODIUM CHLORIDE 0.9 % IV SOLN
2.0000 mg | Freq: Once | INTRAVENOUS | Status: AC
  Administered 2024-04-29: 2 mg via INTRAVENOUS
  Filled 2024-04-29: qty 5

## 2024-04-29 MED ORDER — ONDANSETRON HCL 4 MG/2ML IJ SOLN
8.0000 mg | Freq: Once | INTRAMUSCULAR | Status: AC
  Administered 2024-04-29: 8 mg via INTRAVENOUS
  Filled 2024-04-29: qty 4

## 2024-04-29 NOTE — Patient Instructions (Signed)
 Copper  Injection What is this medication? COPPER  (KOP er) prevents and treats low levels of copper  in your body. Copper  is a mineral that plays an important role in forming red blood cells. It also helps maintain nervous and immune system health. This medicine may be used for other purposes; ask your health care provider or pharmacist if you have questions. What should I tell my care team before I take this medication? They need to know if you have any of these conditions: Kidney disease Liver disease Low levels of zinc  in the blood Wilson disease An unusual or allergic reaction to copper , other medications, foods, dyes, or preservatives Pregnant or trying to get pregnant Breastfeeding How should I use this medication? This medication is infused into a vein. It is given by your care team in a hospital or clinic setting. Talk to your care team about the use of this medication in children. While it may be prescribed to children for selected conditions, precautions do apply. Overdosage: If you think you have taken too much of this medicine contact a poison control center or emergency room at once. NOTE: This medicine is only for you. Do not share this medicine with others. What if I miss a dose? This does not apply. What may interact with this medication? Interactions are not expected. This list may not describe all possible interactions. Give your health care provider a list of all the medicines, herbs, non-prescription drugs, or dietary supplements you use. Also tell them if you smoke, drink alcohol, or use illegal drugs. Some items may interact with your medicine. What should I watch for while using this medication? Your condition will be monitored carefully while you are receiving this medication. You may need blood work done while you are taking this medication. What side effects may I notice from receiving this medication? Side effects that you should report to your care team as soon as  possible: Allergic reactions--skin rash, itching, hives, swelling of the face, lips, tongue, or throat This list may not describe all possible side effects. Call your doctor for medical advice about side effects. You may report side effects to FDA at 1-800-FDA-1088. Where should I keep my medication? This medication is given in a hospital or clinic. It will not be stored at home. NOTE: This sheet is a summary. It may not cover all possible information. If you have questions about this medicine, talk to your doctor, pharmacist, or health care provider.  2024 Elsevier/Gold Standard (2023-10-10 00:00:00)

## 2024-04-30 ENCOUNTER — Inpatient Hospital Stay

## 2024-04-30 VITALS — BP 114/73 | HR 52 | Temp 97.7°F | Resp 18

## 2024-04-30 DIAGNOSIS — D696 Thrombocytopenia, unspecified: Secondary | ICD-10-CM | POA: Diagnosis not present

## 2024-04-30 DIAGNOSIS — E610 Copper deficiency: Secondary | ICD-10-CM

## 2024-04-30 MED ORDER — ONDANSETRON HCL 4 MG/2ML IJ SOLN
8.0000 mg | Freq: Once | INTRAMUSCULAR | Status: AC
  Administered 2024-04-30: 8 mg via INTRAVENOUS
  Filled 2024-04-30: qty 4

## 2024-04-30 MED ORDER — LORATADINE 10 MG PO TABS
10.0000 mg | ORAL_TABLET | Freq: Once | ORAL | Status: AC
  Administered 2024-04-30: 10 mg via ORAL
  Filled 2024-04-30: qty 1

## 2024-04-30 MED ORDER — SODIUM CHLORIDE 0.9 % IV SOLN: Freq: Once | INTRAVENOUS | Status: AC

## 2024-04-30 MED ORDER — SODIUM CHLORIDE 0.9 % IV SOLN
2.0000 mg | Freq: Once | INTRAVENOUS | Status: AC
  Administered 2024-04-30: 2 mg via INTRAVENOUS
  Filled 2024-04-30: qty 5

## 2024-05-05 DIAGNOSIS — K432 Incisional hernia without obstruction or gangrene: Secondary | ICD-10-CM | POA: Diagnosis not present

## 2024-05-05 DIAGNOSIS — K4091 Unilateral inguinal hernia, without obstruction or gangrene, recurrent: Secondary | ICD-10-CM | POA: Diagnosis not present

## 2024-05-07 ENCOUNTER — Telehealth: Payer: Self-pay | Admitting: *Deleted

## 2024-05-07 DIAGNOSIS — K432 Incisional hernia without obstruction or gangrene: Secondary | ICD-10-CM | POA: Diagnosis not present

## 2024-05-07 DIAGNOSIS — K4091 Unilateral inguinal hernia, without obstruction or gangrene, recurrent: Secondary | ICD-10-CM | POA: Diagnosis not present

## 2024-05-07 NOTE — Telephone Encounter (Signed)
 Per patient surgery will not be till next year we will need an updated clearance once surgery has been scheduled. Will make requesting office aware and will remove patient from preop pool till further notice

## 2024-05-07 NOTE — Telephone Encounter (Signed)
   Pre-operative Risk Assessment    Patient Name: Eric Suarez  DOB: November 24, 1945 MRN: [id number]   Date of last office visit: 03/16/24 DR. HARDING Date of next office visit: NONE   Request for Surgical Clearance    Procedure:  HERNIA SURGERY AS NOTED PER FORM  Date of Surgery:  Clearance TBD                                Surgeon:  DR. PAUL STECHSCHULTE Surgeon's Group or Practice Name:  [facility] Phone number:  [phone] Fax number:  [phone] HOLLIE TROY, CMA   Type of Clearance Requested:   - Medical  - Pharmacy:  Hold Aspirin      Type of Anesthesia:  General    Additional requests/questions:    [name]   05/07/2024, 12:30 PM

## 2024-05-07 NOTE — Telephone Encounter (Signed)
 Called patient for phone clearance.  He indicated that he would not be having the surgery until sometime in January of 2026.  He will need another pre-op clearance request from  DR. PAUL STECHSCHULTE  Phone number:  [phone]. Please let surgeon know to send new request. Thank you.

## 2024-06-14 DIAGNOSIS — L299 Pruritus, unspecified: Secondary | ICD-10-CM | POA: Diagnosis not present

## 2024-06-14 DIAGNOSIS — L237 Allergic contact dermatitis due to plants, except food: Secondary | ICD-10-CM | POA: Diagnosis not present

## 2024-07-01 DIAGNOSIS — M5481 Occipital neuralgia: Secondary | ICD-10-CM | POA: Diagnosis not present

## 2024-07-16 ENCOUNTER — Telehealth: Payer: Self-pay

## 2024-07-16 NOTE — Telephone Encounter (Signed)
 S/W pt and schedule TELE Preop appt 07/23/24. Med Rec and Consent done   Will update surgeons office.

## 2024-07-16 NOTE — Telephone Encounter (Signed)
   Name: Eric Suarez  DOB: [birth year]  MRN: [id number]  Primary Cardiologist: Alm Clay, MD   Preoperative team, please contact this patient and set up a phone call appointment for further preoperative risk assessment. Please obtain consent and complete medication review. Thank you for your help.  I confirm that guidance regarding antiplatelet and oral anticoagulation therapy has been completed and, if necessary, noted below.  Per office protocol, if patient is without any new symptoms or concerns at the time of their virtual visit, he may hold ASA for 7 days prior to procedure. Please resume ASA as soon as possible postprocedure, at the discretion of the surgeon.    I also confirmed the patient resides in the state of [place] . As per [facility] Medical Board telemedicine laws, the patient must reside in the state in which the provider is licensed.   Lamarr Satterfield, NP 07/16/2024, 1:57 PM [facility]

## 2024-07-16 NOTE — Telephone Encounter (Signed)
 Patient calling in because his hernia surgery will no longer be in January and is being pushed up. He said the referring office faxed over a clearance today.

## 2024-07-16 NOTE — Telephone Encounter (Addendum)
 2nd request received.   PT CALLED STATING THAT THE PROCEDURE IS BEING PUSHED UP NO LONGER GOING TO BE IN JAN 2026. See notes.  Will route the preop pool for the Preop APP to review.     Pre-operative Risk Assessment    Patient Name: Eric Suarez  DOB: [birth year] MRN: [id number]   Date of last office visit: 03/16/24 ALM CLAY, MD Date of next office visit: NONE   Request for Surgical Clearance    Procedure:  HERNIA SURGERY  Date of Surgery:  Clearance TBD                                Surgeon:  DEWARD FOY, MD Surgeon's Group or Practice Name:  [facility] Phone number:  [phone] Fax number:  [phone]    ATTN: ROSELINE ARGYLE, CMA   Type of Clearance Requested:   - Medical  - Pharmacy:  Hold Aspirin      Type of Anesthesia:  General    Additional requests/questions:    Signed, Lucie DELENA Ku   07/16/2024, 1:47 PM

## 2024-07-16 NOTE — Telephone Encounter (Signed)
 Med Rec and Consent done     Patient Consent for Virtual Visit        Eric Suarez has provided verbal consent on 07/16/2024 for a virtual visit (video or telephone).   CONSENT FOR VIRTUAL VISIT FOR:  Eric Suarez  By participating in this virtual visit I agree to the following:  I hereby voluntarily request, consent and authorize [facility] and its employed or contracted physicians, physician assistants, nurse practitioners or other licensed health care professionals (the Practitioner), to provide me with telemedicine health care services (the "Services) as deemed necessary by the treating Practitioner. I acknowledge and consent to receive the Services by the Practitioner via telemedicine. I understand that the telemedicine visit will involve communicating with the Practitioner through live audiovisual communication technology and the disclosure of certain medical information by electronic transmission. I acknowledge that I have been given the opportunity to request an in-person assessment or other available alternative prior to the telemedicine visit and am voluntarily participating in the telemedicine visit.  I understand that I have the right to withhold or withdraw my consent to the use of telemedicine in the course of my care at any time, without affecting my right to future care or treatment, and that the Practitioner or I may terminate the telemedicine visit at any time. I understand that I have the right to inspect all information obtained and/or recorded in the course of the telemedicine visit and may receive copies of available information for a reasonable fee.  I understand that some of the potential risks of receiving the Services via telemedicine include:  Delay or interruption in medical evaluation due to technological equipment failure or disruption; Information transmitted may not be sufficient (e.g. poor resolution of images) to allow for appropriate medical  decision making by the Practitioner; and/or  In rare instances, security protocols could fail, causing a breach of personal health information.  Furthermore, I acknowledge that it is my responsibility to provide information about my medical history, conditions and care that is complete and accurate to the best of my ability. I acknowledge that Practitioner's advice, recommendations, and/or decision may be based on factors not within their control, such as incomplete or inaccurate data provided by me or distortions of diagnostic images or specimens that may result from electronic transmissions. I understand that the practice of medicine is not an exact science and that Practitioner makes no warranties or guarantees regarding treatment outcomes. I acknowledge that a copy of this consent can be made available to me via my patient portal [facility] MyChart), or I can request a printed copy by calling the office of [facility].    I understand that my insurance will be billed for this visit.   I have read or had this consent read to me. I understand the contents of this consent, which adequately explains the benefits and risks of the Services being provided via telemedicine.  I have been provided ample opportunity to ask questions regarding this consent and the Services and have had my questions answered to my satisfaction. I give my informed consent for the services to be provided through the use of telemedicine in my medical care

## 2024-07-23 ENCOUNTER — Ambulatory Visit: Attending: Cardiology

## 2024-07-23 DIAGNOSIS — Z01810 Encounter for preprocedural cardiovascular examination: Secondary | ICD-10-CM

## 2024-07-23 NOTE — Progress Notes (Signed)
 Virtual Visit via Telephone Note   Because of [name] co-morbid illnesses, he is at least at moderate risk for complications without adequate follow up.  This format is felt to be most appropriate for this patient at this time.  Due to technical limitations with video connection (technology), today's appointment will be conducted as an audio only telehealth visit, and Eric Suarez verbally agreed to proceed in this manner.   All issues noted in this document were discussed and addressed.  No physical exam could be performed with this format.  Evaluation Performed:  Preoperative cardiovascular risk assessment _____________   Date:  07/23/2024   Patient ID:  Eric Suarez, DOB [birth year], MRN [id number] Patient Location:  Home Provider location:   Office  Primary Care Provider:  Clarice Nottingham, MD Primary Cardiologist:  Alm Clay, MD  Chief Complaint / Patient Profile   78 y.o. y/o male with a h/o chronic CAD in the LAD, hypertension, hyperlipidemia with LDL goal less than 70, CKD, hypothyroidism, chronic back pain due to neuralgia, GERD, benign prostatic hyperplasia, colon polyps who is pending hernia surgery and presents today for telephonic preoperative cardiovascular risk assessment.  History of Present Illness    Eric Suarez is a 78 y.o. male who presents via audio/video conferencing for a telehealth visit today.  Pt was last seen in cardiology clinic on 03/16/2024 by Dr. Clay.  At that time Eric Suarez was doing well.  The patient is now pending procedure as outlined above. Since his last visit, he has not had any issues with his heart. No chest pain or pressure. No nitroglycerin . No SOB. No swelling.He remains active and surpasses 4 mets on the DASI.  Per office protocol, if patient is without any new symptoms or concerns at the time of their virtual visit, he may hold ASA for 7 days prior to procedure. Please resume ASA as soon as possible  postprocedure, at the discretion of the surgeon.    Past Medical History    Past Medical History:  Diagnosis Date   Anxiety    Arthritis    BPH (benign prostatic hyperplasia)    Cataracts, bilateral    Chronic back pain    Followed by Dr. Mavis   CKD (chronic kidney disease) stage 3, GFR 30-59 ml/min (HCC) 09/2021   Creatinine 1.47   Complication of anesthesia    unable to void after surgery   Coronary artery disease 10/31/2021   Coronary Calcium  Score 535   GERD (gastroesophageal reflux disease)    History of hiatal hernia    History of kidney surgery    left kidney removed due to cancer 2017   History of thyroid  cancer 1998   Thyroidectomy-now on Synthroid    Hyperlipidemia    Hypertension    Hypothyroidism (acquired) 1998   Post thyroidectomy for thyroid  cancer   Macular degeneration of both eyes    dry   Mixed dyslipidemia 09/27/2021   Low HDL, high triglycerides with normal LDL: TC 117, TG 135, HDL 25, LDL 56.   Nocturia    Occipital neuralgia    PONV (postoperative nausea and vomiting)    Renal cell carcinoma, left (HCC) 2017   Left nephrectomy-Dr. Lourdes, complicated by multiple different hernia surgeries.   Renal cyst    Past Surgical History:  Procedure Laterality Date   APPENDECTOMY     COLONOSCOPY     COLONOSCOPY WITH PROPOFOL  N/A 08/14/2023   Procedure: COLONOSCOPY WITH PROPOFOL ;  Surgeon: San Sandor GAILS,  DO;  Location: [facility];  Service: Gastroenterology;  Laterality: N/A;   CYSTOSCOPY WITH RETROGRADE PYELOGRAM, URETEROSCOPY AND STENT PLACEMENT Left 04/01/2016   Procedure: CYSTOSCOPY WITH RETROGRADE PYELOGRAM, left ureter;  Surgeon: Gretel Ferrara, MD;  Location: [facility];  Service: Urology;  Laterality: Left;   HEMORRHOID SURGERY     HEMOSTASIS CLIP PLACEMENT  08/14/2023   Procedure: HEMOSTASIS CLIP PLACEMENT;  Surgeon: San Sandor GAILS, DO;  Location: [facility];  Service: Gastroenterology;;   HERNIA REPAIR     inguinal-3 on left , 1 on  right   INCISIONAL HERNIA REPAIR N/A 12/23/2016   Procedure: LAPAROSCOPIC VERSUS OPEN LYSIS OF ADHESIONS AND INCISIONAL HERNIA REPAIR WITH MESH;  Surgeon: Lynda Leos, MD;  Location: [facility] OR;  Service: General;  Laterality: N/A;   INCISIONAL HERNIA REPAIR  10/08/2017   OPEN   INCISIONAL HERNIA REPAIR N/A 10/08/2017   Procedure: OPEN HERNIA REPAIR INCISIONAL ERAS PATHWAY;  Surgeon: Leos Lynda, MD;  [facility];  Service: General;  Laterality: N/A;   INSERTION OF MESH N/A 12/23/2016   Procedure: INSERTION OF MESH;  Surgeon: Lynda Leos, MD;  Location: [facility] OR;  Service: General;  Laterality: N/A;   INSERTION OF MESH N/A 01/10/2017   Procedure: INSERTION OF MESH;  Surgeon: Lynda Leos, MD;  Location: [facility] OR;  Service: General;  Laterality: N/A;   INSERTION OF MESH N/A 10/08/2017   Procedure: INSERTION OF MESH;  Surgeon: Leos Lynda, MD;  [facility];  Service: General;  Laterality: N/A;   LAPAROSCOPIC NEPHRECTOMY Left 04/18/2016   Procedure: LAPAROSCOPIC RADICAL  LEFT NEPHRECTOMY;  Surgeon: Gretel Ferrara, MD;  Location: [facility];  Service: Urology;  Laterality: Left;   LAPAROSCOPY N/A 01/10/2017   Procedure: LAPAROSCOPY DIAGNOSTIC , POSSIBLE LAPAROTOMY;  Surgeon: Lynda Leos, MD;  Location: [facility];  Service: General;  Laterality: N/A;   NASAL SINUS SURGERY     left side   NASAL SINUS SURGERY Left 10/25/2019   Procedure: ENDOSCOPIC SINUS SURGERY/ethmoid and maxillary;  Surgeon: Jesus Oliphant, MD;  Location: [facility];  Service: ENT;  Laterality: Left;   THYROIDECTOMY, PARTIAL  1998   left small amount on left side-for cancer   TONSILLECTOMY     TOTAL KNEE ARTHROPLASTY Right 11/19/2022   Procedure: RIGHT TOTAL KNEE ARTHROPLASTY;  Surgeon: Addie Cordella Hamilton, MD;  Location: [facility];  Service: Orthopedics;  Laterality: Right;    Allergies  Allergies  Allergen Reactions   Tamsulosin Nausea And Vomiting and Other (See Comments)    Uncomfortable in pelvic  region   Fish Oil Hives   Rosuvastatin Calcium  Other (See Comments)   Sulfa Antibiotics Rash    Home Medications    Prior to Admission medications   Medication Sig Start Date End Date Taking? Authorizing Provider  acetaminophen  (TYLENOL ) 325 MG tablet Take 1-2 tablets (325-650 mg total) by mouth every 6 (six) hours as needed for mild pain (pain score 1-3 or temp > 100.5). 11/20/22   Addie Cordella Hamilton, MD  alfuzosin  (UROXATRAL ) 10 MG 24 hr tablet Take 10 mg daily by mouth.  12/30/16   [provider]  Ascorbic Acid  (VITAMIN C ) 500 MG CHEW Chew 500 mg by mouth daily.    [provider]  aspirin  81 MG chewable tablet Chew 1 tablet (81 mg total) by mouth 2 (two) times daily. 11/20/22   Addie Cordella Hamilton, MD  atenolol -chlorthalidone  (TENORETIC ) 50-25 MG tablet Take 0.5 tablets by mouth daily.    [provider]  Cholecalciferol (VITAMIN D3) 25  MCG (1000 UT) CAPS Take 1,000 Units by mouth daily.    [provider]  dutasteride  (AVODART ) 0.5 MG capsule Take 0.5 mg daily by mouth.    [provider]  ezetimibe  (ZETIA ) 10 MG tablet Take 10 mg by mouth daily. 12/12/21   [provider]  gabapentin  (NEURONTIN ) 400 MG capsule Take 400 mg by mouth 2 (two) times daily.    [provider]  levothyroxine  (SYNTHROID ) 125 MCG tablet Take 125 mcg by mouth daily before breakfast.    [provider]  Multiple Vitamins-Minerals (EYE VITAMINS PO) Take 1 tablet by mouth daily.    [provider]  nitroGLYCERIN  (NITROSTAT ) 0.4 MG SL tablet Place 0.4 mg under the tongue every 5 (five) minutes as needed for chest pain.    [provider]  omeprazole (PRILOSEC) 20 MG capsule Take 20 mg by mouth at bedtime.    [provider]  PARoxetine  (PAXIL ) 20 MG tablet Take by mouth. 03/27/24   [provider]  PARoxetine  (PAXIL -CR) 25 MG 24 hr tablet Take 25 mg by mouth daily. 02/21/16   [provider]  potassium  chloride SA (K-DUR,KLOR-CON ) 20 MEQ tablet Take 20 mEq by mouth every other day.    [provider]    Physical Exam    Vital Signs:  LAVIN PETTEWAY does not have vital signs available for review today.  Given telephonic nature of communication, physical exam is limited. AAOx3. NAD. Normal affect.  Speech and respirations are unlabored.  Accessory Clinical Findings    None  Assessment & Plan    1.  Preoperative Cardiovascular Risk Assessment:  Mr. Bochicchio perioperative risk of a major cardiac event is 0.9% according to the Revised Cardiac Risk Index (RCRI).  Therefore, he is at low risk for perioperative complications.   His functional capacity is good at 5.62 METs according to the [facility] Activity Status Index (DASI). Recommendations: According to ACC/AHA guidelines, no further cardiovascular testing needed.  The patient may proceed to surgery at acceptable risk.   Antiplatelet and/or Anticoagulation Recommendations: Aspirin  can be held for 7 days prior to his surgery.  Please resume Aspirin  post operatively when it is felt to be safe from a bleeding standpoint.   The patient was advised that if he develops new symptoms prior to surgery to contact our office to arrange for a follow-up visit, and he verbalized understanding.   A copy of this note will be routed to requesting surgeon.  Time:   Today, I have spent 6 minutes with the patient with telehealth technology discussing medical history, symptoms, and management plan.     Orren LOISE Fabry, PA-C  07/23/2024, 9:07 AM

## 2024-07-29 ENCOUNTER — Encounter: Payer: Self-pay | Admitting: Oncology

## 2024-07-30 ENCOUNTER — Ambulatory Visit: Payer: Self-pay | Admitting: Surgery

## 2024-07-30 NOTE — Progress Notes (Signed)
 Sent message, via epic in basket, requesting orders in epic from Careers adviser.

## 2024-08-04 ENCOUNTER — Telehealth: Payer: Self-pay

## 2024-08-04 ENCOUNTER — Other Ambulatory Visit: Payer: Self-pay

## 2024-08-04 ENCOUNTER — Encounter: Payer: Self-pay | Admitting: Oncology

## 2024-08-04 NOTE — Telephone Encounter (Signed)
 Yes but they usually will do that at hospital

## 2024-08-04 NOTE — Telephone Encounter (Signed)
 Patient called wanting to know if he needed to take antibiotic before having his hernia surgery.  Cb# [phone].  Please advise.  Thank you.

## 2024-08-05 ENCOUNTER — Inpatient Hospital Stay (HOSPITAL_BASED_OUTPATIENT_CLINIC_OR_DEPARTMENT_OTHER)
Admission: RE | Admit: 2024-08-05 | Discharge: 2024-08-05 | Discharge status: Home / Self Care | Source: Ambulatory Visit | Attending: Oncology | Admitting: Oncology

## 2024-08-05 ENCOUNTER — Other Ambulatory Visit

## 2024-08-05 ENCOUNTER — Inpatient Hospital Stay: Attending: Oncology

## 2024-08-05 DIAGNOSIS — K5730 Diverticulosis of large intestine without perforation or abscess without bleeding: Secondary | ICD-10-CM | POA: Diagnosis not present

## 2024-08-05 DIAGNOSIS — E890 Postprocedural hypothyroidism: Secondary | ICD-10-CM | POA: Insufficient documentation

## 2024-08-05 DIAGNOSIS — Z905 Acquired absence of kidney: Secondary | ICD-10-CM | POA: Diagnosis not present

## 2024-08-05 DIAGNOSIS — N183 Chronic kidney disease, stage 3 unspecified: Secondary | ICD-10-CM | POA: Diagnosis not present

## 2024-08-05 DIAGNOSIS — D696 Thrombocytopenia, unspecified: Secondary | ICD-10-CM | POA: Insufficient documentation

## 2024-08-05 DIAGNOSIS — E610 Copper deficiency: Secondary | ICD-10-CM | POA: Insufficient documentation

## 2024-08-05 DIAGNOSIS — I129 Hypertensive chronic kidney disease with stage 1 through stage 4 chronic kidney disease, or unspecified chronic kidney disease: Secondary | ICD-10-CM | POA: Insufficient documentation

## 2024-08-05 DIAGNOSIS — Z85850 Personal history of malignant neoplasm of thyroid: Secondary | ICD-10-CM | POA: Diagnosis not present

## 2024-08-05 DIAGNOSIS — R161 Splenomegaly, not elsewhere classified: Secondary | ICD-10-CM | POA: Diagnosis not present

## 2024-08-05 DIAGNOSIS — I2510 Atherosclerotic heart disease of native coronary artery without angina pectoris: Secondary | ICD-10-CM | POA: Diagnosis not present

## 2024-08-05 DIAGNOSIS — D649 Anemia, unspecified: Secondary | ICD-10-CM | POA: Insufficient documentation

## 2024-08-05 DIAGNOSIS — K4090 Unilateral inguinal hernia, without obstruction or gangrene, not specified as recurrent: Secondary | ICD-10-CM | POA: Insufficient documentation

## 2024-08-05 DIAGNOSIS — K439 Ventral hernia without obstruction or gangrene: Secondary | ICD-10-CM | POA: Diagnosis not present

## 2024-08-05 DIAGNOSIS — Z85528 Personal history of other malignant neoplasm of kidney: Secondary | ICD-10-CM | POA: Diagnosis not present

## 2024-08-05 DIAGNOSIS — I96 Gangrene, not elsewhere classified: Secondary | ICD-10-CM | POA: Diagnosis not present

## 2024-08-05 LAB — CBC WITH DIFFERENTIAL (CANCER CENTER ONLY)
Abs Immature Granulocytes: 0.01 K/uL (ref 0.00–0.07)
Basophils Absolute: 0 K/uL (ref 0.0–0.1)
Basophils Relative: 0 %
Eosinophils Absolute: 0.1 K/uL (ref 0.0–0.5)
Eosinophils Relative: 1 %
HCT: 38.1 % — ABNORMAL LOW (ref 39.0–52.0)
Hemoglobin: 12.9 g/dL — ABNORMAL LOW (ref 13.0–17.0)
Immature Granulocytes: 0 %
Lymphocytes Relative: 23 %
Lymphs Abs: 1.3 K/uL (ref 0.7–4.0)
MCH: 29.7 pg (ref 26.0–34.0)
MCHC: 33.9 g/dL (ref 30.0–36.0)
MCV: 87.6 fL (ref 80.0–100.0)
Monocytes Absolute: 0.3 K/uL (ref 0.1–1.0)
Monocytes Relative: 6 %
Neutro Abs: 3.7 K/uL (ref 1.7–7.7)
Neutrophils Relative %: 70 %
Platelet Count: 65 K/uL — ABNORMAL LOW (ref 150–400)
RBC: 4.35 MIL/uL (ref 4.22–5.81)
RDW: 16.5 % — ABNORMAL HIGH (ref 11.5–15.5)
WBC Count: 5.4 K/uL (ref 4.0–10.5)
nRBC: 0 % (ref 0.0–0.2)

## 2024-08-05 LAB — CMP (CANCER CENTER ONLY)
ALT: 14 U/L (ref 0–44)
AST: 17 U/L (ref 15–41)
Albumin: 3.9 g/dL (ref 3.5–5.0)
Alkaline Phosphatase: 76 U/L (ref 38–126)
Anion gap: 12 (ref 5–15)
BUN: 20 mg/dL (ref 8–23)
CO2: 21 mmol/L — ABNORMAL LOW (ref 22–32)
Calcium: 8.8 mg/dL — ABNORMAL LOW (ref 8.9–10.3)
Chloride: 106 mmol/L (ref 98–111)
Creatinine: 1.36 mg/dL — ABNORMAL HIGH (ref 0.61–1.24)
GFR, Estimated: 53 mL/min — ABNORMAL LOW (ref >60)
Glucose, Bld: 116 mg/dL — ABNORMAL HIGH (ref 70–99)
Potassium: 4 mmol/L (ref 3.5–5.1)
Sodium: 139 mmol/L (ref 135–145)
Total Bilirubin: 1.1 mg/dL (ref 0.0–1.2)
Total Protein: 6.4 g/dL — ABNORMAL LOW (ref 6.5–8.1)

## 2024-08-05 LAB — VITAMIN B12: Vitamin B-12: 1707 pg/mL — ABNORMAL HIGH (ref 180–914)

## 2024-08-05 LAB — IRON AND TIBC
Iron: 48 ug/dL (ref 45–182)
Saturation Ratios: 14 % — ABNORMAL LOW (ref 17.9–39.5)
TIBC: 344 ug/dL (ref 250–450)
UIBC: 297 ug/dL

## 2024-08-05 LAB — FERRITIN: Ferritin: 25 ng/mL (ref 24–336)

## 2024-08-05 LAB — FOLATE: Folate: 18.4 ng/mL (ref >5.9)

## 2024-08-05 MED ORDER — IOHEXOL 300 MG/ML  SOLN
100.0000 mL | Freq: Once | INTRAMUSCULAR | Status: AC | PRN
  Administered 2024-08-05: 100 mL via INTRAVENOUS

## 2024-08-05 NOTE — Progress Notes (Signed)
 COVID Vaccine Completed:  Date of COVID positive in last 90 days:  PCP - Ryan Hives, MD Cardiologist - Alm Clay, MD ARNET[place] 03/16/24 Neurology- Camellia Custard, MD Oncologist- Dr. Ezzard  Cardiac clearance by Orren Fabry, PA 07/23/24 in Epic  Chest x-ray - N/A EKG - 03/16/24 Epic bradycardia at 53 Stress Test - over 20 years ago per pt ECHO - 01/14/22 Epic Cardiac Cath - n/a Pacemaker/ICD device last checked:N/A Spinal Cord Stimulator:N/A  Bowel Prep - N/A  Sleep Study - N/A CPAP -   Fasting Blood Sugar - N/A Checks Blood Sugar _____ times a day  Last dose of GLP1 agonist-  N/A GLP1 instructions:  Do not take after     Last dose of SGLT-2 inhibitors-  N/A SGLT-2 instructions:  Do not take after     Blood Thinner Instructions: N/A Last dose:   Time: Aspirin  Instructions: ASA 81, hold 7 days Last Dose:  Activity level: Can go up a flight of stairs and perform activities of daily living without stopping and without symptoms of chest pain or shortness of breath.   Anesthesia review: HTN, CAD, thrombocytopenia, CKD,  Patient denies shortness of breath, fever, cough and chest pain at PAT appointment  Patient verbalized understanding of instructions that were given to them at the PAT appointment. Patient was also instructed that they will need to review over the PAT instructions again at home before surgery.

## 2024-08-05 NOTE — Patient Instructions (Signed)
 SURGICAL WAITING ROOM VISITATION  Patients having surgery or a procedure may have no more than 2 support people in the waiting area - these visitors may rotate.    Children under the age of 15 must have an adult with them who is not the patient.  Visitors with respiratory illnesses are discouraged from visiting and should remain at home.  If the patient needs to stay at the hospital during part of their recovery, the visitor guidelines for inpatient rooms apply. Pre-op nurse will coordinate an appropriate time for 1 support person to accompany patient in pre-op.  This support person may not rotate.    Please refer to the [facility] website for the visitor guidelines for Inpatients (after your surgery is over and you are in a regular room).    Your procedure is scheduled on: 08/09/24   Report to [facility] Main Entrance    Report to admitting at 12:05 PM   Call this number if you have problems the morning of surgery [phone]   Do not eat food :After Midnight.   After Midnight you may have the following liquids until 11:20 AM DAY OF SURGERY  Water  Non-Citrus Juices (without pulp, NO RED-Apple, White grape, White cranberry) Black Coffee (NO MILK/CREAM OR CREAMERS, sugar ok)  Clear Tea (NO MILK/CREAM OR CREAMERS, sugar ok) regular and decaf                             Plain Jell-O (NO RED)                                           Fruit ices (not with fruit pulp, NO RED)                                     Popsicles (NO RED)                                                               Sports drinks like Gatorade (NO RED)                     If you have questions, please contact your surgeon's office.   FOLLOW BOWEL PREP AND ANY ADDITIONAL PRE OP INSTRUCTIONS YOU RECEIVED FROM YOUR SURGEON'S OFFICE!!!     Oral Hygiene is also important to reduce your risk of infection.                                    Remember - BRUSH YOUR TEETH THE MORNING OF SURGERY WITH YOUR  REGULAR TOOTHPASTE  DENTURES WILL BE REMOVED PRIOR TO SURGERY PLEASE DO NOT APPLY Poly grip OR ADHESIVES!!!   Stop all vitamins and herbal supplements 7 days before surgery.   Take these medicines the morning of surgery with A SIP OF WATER : Tylenol , Zetia , Gabapentin , Levothyroxine , Omeprazole, Paroxetine   You may not have any metal on your body including jewelry, and body piercing             Do not wear lotions, powders, cologne, or deodorant              Men may shave face and neck.   Do not bring valuables to the hospital. [facility] IS NOT             RESPONSIBLE   FOR VALUABLES.   Contacts, glasses, dentures or bridgework may not be worn into surgery.  DO NOT BRING YOUR HOME MEDICATIONS TO THE HOSPITAL. PHARMACY WILL DISPENSE MEDICATIONS LISTED ON YOUR MEDICATION LIST TO YOU DURING YOUR ADMISSION IN THE HOSPITAL!    Patients discharged on the day of surgery will not be allowed to drive home.  Someone NEEDS to stay with you for the first 24 hours after anesthesia.              Please read over the following fact sheets you were given: IF YOU HAVE QUESTIONS ABOUT YOUR PRE-OP INSTRUCTIONS PLEASE CALL [phone] [place].   If you received a COVID test during your pre-op visit  it is requested that you wear a mask when out in public, stay away from anyone that may not be feeling well and notify your surgeon if you develop symptoms. If you test positive for Covid or have been in contact with anyone that has tested positive in the last 10 days please notify you surgeon.    [facility] - Preparing for Surgery Before surgery, you can play an important role.  Because skin is not sterile, your skin needs to be as free of germs as possible.  You can reduce the number of germs on your skin by washing with CHG (chlorahexidine gluconate) soap before surgery.  CHG is an antiseptic cleaner which kills germs and bonds with the skin to continue killing germs even  after washing. Please DO NOT use if you have an allergy to CHG or antibacterial soaps.  If your skin becomes reddened/irritated stop using the CHG and inform your nurse when you arrive at Short Stay. Do not shave (including legs and underarms) for at least 48 hours prior to the first CHG shower.  You may shave your face/neck.  Please follow these instructions carefully:  1.  Shower with CHG Soap the night before surgery and the  morning of surgery.  2.  If you choose to wash your hair, wash your hair first as usual with your normal  shampoo.  3.  After you shampoo, rinse your hair and body thoroughly to remove the shampoo.                             4.  Use CHG as you would any other liquid soap.  You can apply chg directly to the skin and wash.  Gently with a scrungie or clean washcloth.  5.  Apply the CHG Soap to your body ONLY FROM THE NECK DOWN.   Do   not use on face/ open                           Wound or open sores. Avoid contact with eyes, ears mouth and   genitals (private parts).                       Wash  face,  Genitals (private parts) with your normal soap.             6.  Wash thoroughly, paying special attention to the area where your    surgery  will be performed.  7.  Thoroughly rinse your body with warm water  from the neck down.  8.  DO NOT shower/wash with your normal soap after using and rinsing off the CHG Soap.                9.  Pat yourself dry with a clean towel.            10.  Wear clean pajamas.            11.  Place clean sheets on your bed the night of your first shower and do not  sleep with pets. Day of Surgery : Do not apply any lotions/deodorants the morning of surgery.  Please wear clean clothes to the hospital/surgery center.  FAILURE TO FOLLOW THESE INSTRUCTIONS MAY RESULT IN THE CANCELLATION OF YOUR SURGERY  PATIENT SIGNATURE_________________________________  NURSE  SIGNATURE__________________________________  ________________________________________________________________________

## 2024-08-05 NOTE — Progress Notes (Signed)
 [facility] [address] [place],  [place]  [address] [phone]  Clinic Day:  08/06/2024  Referring physician: Clarice Nottingham, MD   HISTORY OF PRESENT ILLNESS:  The patient is a 78 y.o. male with thrombocytopenia.  Labs have also shown him to be mildly anemic.  Labs have shown him to be copper  deficient, for which she has received IV copper  in the past.  His copper  deficiency is likely due to him having taken zinc  on a daily basis for decades, which he no longer does.  He comes in today to reassess his peripheral counts, as well as to go over his recent abdominal CT scan.  Since his last visit, the patient has been doing fairly well.  With respect to his thrombocytopenia, he continues to deny having any subcutaneous bleeding/bruising issues which concern him for having a severely low platelet count.  Per hospital records, this gentleman has had borderline thrombocytopenia dating back to at least 2018.  He also recently had an abdominal ultrasound that suggested fatty liver disease and an enlarged spleen.  PHYSICAL EXAM:  Blood pressure 120/75, pulse (!) 56, temperature 98.6 F (37 C), temperature source Oral, resp. rate 16, height 5' 11 (1.803 m), weight 193 lb 11.2 oz (87.9 kg), SpO2 98%. Wt Readings from Last 3 Encounters:  08/06/24 190 lb (86.2 kg)  08/06/24 193 lb 11.2 oz (87.9 kg)  04/02/24 194 lb 11.2 oz (88.3 kg)   Body mass index is 27.02 kg/m. Performance status (ECOG): 1 - Symptomatic but completely ambulatory Physical Exam Constitutional:      Appearance: Normal appearance. He is not ill-appearing.  HENT:     Mouth/Throat:     Mouth: Mucous membranes are moist.     Pharynx: Oropharynx is clear. No oropharyngeal exudate or posterior oropharyngeal erythema.  Cardiovascular:     Rate and Rhythm: Normal rate and regular rhythm.     Heart sounds: No murmur heard.    No friction rub. No gallop.  Pulmonary:     Effort: Pulmonary effort is  normal. No respiratory distress.     Breath sounds: Normal breath sounds. No wheezing, rhonchi or rales.  Abdominal:     General: Bowel sounds are normal. There is no distension.     Palpations: Abdomen is soft. There is no mass.     Tenderness: There is no abdominal tenderness.  Musculoskeletal:        General: No swelling.     Right lower leg: No edema.     Left lower leg: No edema.  Lymphadenopathy:     Cervical: No cervical adenopathy.     Upper Body:     Right upper body: No supraclavicular or axillary adenopathy.     Left upper body: No supraclavicular or axillary adenopathy.     Lower Body: No right inguinal adenopathy. No left inguinal adenopathy.  Skin:    General: Skin is warm.     Coloration: Skin is not jaundiced.     Findings: No lesion or rash.  Neurological:     General: No focal deficit present.     Mental Status: He is alert and oriented to person, place, and time. Mental status is at baseline.  Psychiatric:        Mood and Affect: Mood normal.        Behavior: Behavior normal.        Thought Content: Thought content normal.   SCANS:  CT scans of his abdomen/pelvis 08/05/2024 revealed  the following: FINDINGS: Lower chest: Heart is normal size. Calcified plaque over the left main, left anterior descending and left lateral circumflex coronary arteries. Mild tortuosity of the descending thoracic aorta. Lung bases demonstrate minimal linear bibasilar scarring.   Hepatobiliary: Liver and biliary tree are normal. Calcified gallbladder.   Pancreas: Normal.   Spleen: Evidence of splenomegaly as the spleen measures 17.6 cm in greatest diameter (previously 15.5 cm).   Adrenals/Urinary Tract: Adrenal glands are normal. Evidence of patient's previous left nephrectomy with stable surgical bed. Right kidney is normal in size without hydronephrosis or nephrolithiasis. Several small right renal cysts without significant change. Right ureter is normal. Left ureter is  normal 1.5 cm stone over the dependent portion of the right-side of the bladder adjacent the UVJ. This has increased in size since the prior exam. 4-5 mm stone over the left lateral dependent portion of the bladder adjacent the left UVJ without significant change.   Stomach/Bowel: Stomach is normal. Small bowel is unremarkable. Previous appendectomy. Diverticulosis of the colon.   There is wall thickening with mild adjacent inflammatory change involving the ascending colon as well as a short segment of the sigmoid colon near the junction of the rectum in the midline pelvis. Findings suggest acute colitis which may be of infectious or inflammatory origin. Minimal free fluid over the right pericolic gutter and right perihepatic region.   Vascular/Lymphatic: Calcified plaque over the abdominal aorta which is normal caliber. Remaining vascular structures are unremarkable. No adenopathy.   Reproductive: Prostate is unremarkable.   Other: Postsurgical changes over the midline anterior abdominal wall and evidence of previous ventral hernia repair. Small ventral hernia containing only peritoneal fat just right of midline over the upper abdomen above the hernia repair. Fluid over the right inguinal canal extending towards the scrotum.   Musculoskeletal: Degenerative changes of the spine and hips. Multilevel disc disease over the lumbar spine.   IMPRESSION: 1. Evidence of acute colitis involving the ascending colon as well as a short segment of the sigmoid colon near the junction of the rectum in the midline pelvis. This may be of infectious or inflammatory origin. Minimal free fluid over the right pericolic gutter and right perihepatic region. 2. Diverticulosis of the colon. 3. Splenomegaly as the spleen measures 17.6 cm in greatest diameter (previously 15.5 cm). 4. Cholelithiasis. 5. Evidence of patient's previous left nephrectomy with stable surgical bed. 6. Aortic  atherosclerosis. Atherosclerotic coronary artery disease. 7. Small ventral hernia containing only peritoneal fat just right of midline over the upper abdomen above the hernia repair. 8. Fluid over the right inguinal canal extending to the scrotum.   Aortic Atherosclerosis (ICD10-I70.0).   LABS:      Latest Ref Rng & Units 08/05/2024    1:01 PM 03/29/2024    2:32 PM 12/04/2023    9:52 AM  CBC  WBC 4.0 - 10.5 K/uL 5.4  4.8  6.1   Hemoglobin 13.0 - 17.0 g/dL 87.0  86.9  86.5   Hematocrit 39.0 - 52.0 % 38.1  38.9  40.3   Platelets 150 - 400 K/uL 65  74  98       Latest Ref Rng & Units 08/05/2024    1:01 PM 03/29/2024    2:32 PM 12/04/2023    9:52 AM  CMP  Glucose 70 - 99 mg/dL 883  73  834   BUN 8 - 23 mg/dL 20  23  22    Creatinine 0.61 - 1.24 mg/dL 8.63  8.55  8.35  Sodium 135 - 145 mmol/L 139  140  138   Potassium 3.5 - 5.1 mmol/L 4.0  4.5  4.3   Chloride 98 - 111 mmol/L 106  108  103   CO2 22 - 32 mmol/L 21  22  23    Calcium  8.9 - 10.3 mg/dL 8.8  9.6  9.3   Total Protein 6.5 - 8.1 g/dL 6.4  6.5  6.9   Total Bilirubin 0.0 - 1.2 mg/dL 1.1  0.7  0.8   Alkaline Phos 38 - 126 U/L 76  77  85   AST 15 - 41 U/L 17  16  19    ALT 0 - 44 U/L 14  14  16      Latest Reference Range & Units 08/05/24 13:01  Iron 45 - 182 ug/dL 48  UIBC ug/dL 702  TIBC 749 - 549 ug/dL 655  Saturation Ratios 17.9 - 39.5 % 14 (L)  Ferritin 24 - 336 ng/mL 25  Folate >5.9 ng/mL 18.4  Copper  69 - 132 ug/dL 57 (L)  Vitamin B12 180 - 914 pg/mL 1,707 (H)  (L): Data is abnormally low (H): Data is abnormally high  ASSESSMENT & PLAN:  Assessment/Plan:  A 78 y.o. male with known mild thrombocytopenia and anemia.  When evaluating his labs today, his platelet count is lower than what it was previously.  I reviewed his abdominal CT scan images with him; it appears that this gentleman has prominent splenomegaly.  However, his scans show no concrete evidence of underlying liver disease.  If his peripheral counts  continue to fall over time, the question would be whether he needs to undergo a splenectomy.  Before this surgical procedure is contemplated, I would have him see a liver specialist to ensure he has no baseline liver disease factoring into his splenomegaly, which would preclude him from undergoing a splenectomy.  When evaluating his other labs today, this gentleman's copper  level is still low.  He was supposed to receive IV copper  back in June 2025; however, it appears this was not done.  I will arrange for him to receive IV copper  before his next visit.  As his iron levels are also borderline low, I will also arrange for him to receive IV iron before his next visit.  As he is scheduled for hernia surgery next week, both his IV iron and IV copper  will likely be given in late October 2025.  I will see this patient back in 4 months for repeat clinical assessment.  The patient understands all the plans discussed today and is in agreement with them.    Dejana Pugsley DELENA Kerns, MD

## 2024-08-06 ENCOUNTER — Other Ambulatory Visit: Payer: Self-pay

## 2024-08-06 ENCOUNTER — Inpatient Hospital Stay (HOSPITAL_BASED_OUTPATIENT_CLINIC_OR_DEPARTMENT_OTHER): Admitting: Oncology

## 2024-08-06 ENCOUNTER — Encounter (HOSPITAL_COMMUNITY): Payer: Self-pay

## 2024-08-06 ENCOUNTER — Encounter (HOSPITAL_COMMUNITY)
Admission: RE | Admit: 2024-08-06 | Discharge: 2024-08-06 | Disposition: A | Discharge status: Home / Self Care | Source: Ambulatory Visit | Attending: Surgery | Admitting: Surgery

## 2024-08-06 ENCOUNTER — Other Ambulatory Visit: Payer: Self-pay | Admitting: Oncology

## 2024-08-06 VITALS — BP 109/73 | HR 83 | Temp 97.7°F | Resp 16 | Ht 72.0 in | Wt 190.0 lb

## 2024-08-06 VITALS — BP 120/75 | HR 56 | Temp 98.6°F | Resp 16 | Ht 71.0 in | Wt 193.7 lb

## 2024-08-06 DIAGNOSIS — K4041 Unilateral inguinal hernia, with gangrene, recurrent: Secondary | ICD-10-CM | POA: Insufficient documentation

## 2024-08-06 DIAGNOSIS — D649 Anemia, unspecified: Secondary | ICD-10-CM | POA: Diagnosis not present

## 2024-08-06 DIAGNOSIS — N183 Chronic kidney disease, stage 3 unspecified: Secondary | ICD-10-CM | POA: Insufficient documentation

## 2024-08-06 DIAGNOSIS — Z01812 Encounter for preprocedural laboratory examination: Secondary | ICD-10-CM | POA: Insufficient documentation

## 2024-08-06 DIAGNOSIS — Z85528 Personal history of other malignant neoplasm of kidney: Secondary | ICD-10-CM | POA: Insufficient documentation

## 2024-08-06 DIAGNOSIS — I129 Hypertensive chronic kidney disease with stage 1 through stage 4 chronic kidney disease, or unspecified chronic kidney disease: Secondary | ICD-10-CM | POA: Insufficient documentation

## 2024-08-06 DIAGNOSIS — D508 Other iron deficiency anemias: Secondary | ICD-10-CM

## 2024-08-06 DIAGNOSIS — I10 Essential (primary) hypertension: Secondary | ICD-10-CM

## 2024-08-06 DIAGNOSIS — I2510 Atherosclerotic heart disease of native coronary artery without angina pectoris: Secondary | ICD-10-CM | POA: Insufficient documentation

## 2024-08-06 DIAGNOSIS — Z85850 Personal history of malignant neoplasm of thyroid: Secondary | ICD-10-CM | POA: Insufficient documentation

## 2024-08-06 DIAGNOSIS — E890 Postprocedural hypothyroidism: Secondary | ICD-10-CM | POA: Insufficient documentation

## 2024-08-06 NOTE — Progress Notes (Signed)
 Anesthesia Chart Review   Case: [id number] Date/Time: 08/09/24 1404   Procedure: REPAIR, HERNIA, INGUINAL, ADULT (Right) - OPEN RIGHT INGUINAL HERNIA REPAIR WITH MESH   Anesthesia type: General   Diagnosis: Unilateral recurrent inguinal hernia with gangrene [K40.41]   Pre-op diagnosis: RIGHT INGUINAL HERNIA   Location: [facility] ROOM 01 / [facility]   Surgeons: Stechschulte, Deward PARAS, MD       DISCUSSION:78 y.o. never smoker with h/o PONV, HTN, hypothyroidism, CAD, thrombocytopenia, renal cell carcinoma, CKD Stage III, right inguinal hernia scheduled for above procedure 08/09/24 with Dr. Deward Scarce.   Follows with hematology due to chronic thrombocytopenia. Last seen 08/06/2024.  Platelets 65 at hematology visit yesterday. Forwarded to Dr. Lucillie office.   Per cardiology preoperative evaluation 07/23/24, Eric Suarez perioperative risk of a major cardiac event is 0.9% according to the Revised Cardiac Risk Index (RCRI).  Therefore, he is at low risk for perioperative complications.   His functional capacity is good at 5.62 METs according to the Duke Activity Status Index (DASI). Recommendations: According to ACC/AHA guidelines, no further cardiovascular testing needed.  The patient may proceed to surgery at acceptable risk.   Antiplatelet and/or Anticoagulation Recommendations: Aspirin  can be held for 7 days prior to his surgery.  Please resume Aspirin  post operatively when it is felt to be safe from a bleeding standpoint.  VS: BP 109/73   Pulse 83   Temp 36.5 C   Resp 16   Ht 6' (1.829 m)   Wt 86.2 kg   SpO2 97%   BMI 25.77 kg/m   PROVIDERS: Clarice Nottingham, MD is PCP   Cardiologist - Alm Clay, MD   LABS: Labs reviewed: Acceptable for surgery. (all labs ordered are listed, but only abnormal results are displayed)  Labs Reviewed - No data to display   IMAGES:   EKG:   CV: Echo 01/14/2022 1. Global longitudinal strain is -22.2%. Left ventricular ejection   fraction, by estimation, is 65 to 70%. The left ventricle has normal  function. The left ventricle has no regional wall motion abnormalities.  Left ventricular diastolic parameters are  indeterminate.   2. Right ventricular systolic function is normal. The right ventricular  size is normal.   3. Systolic anterior motion of the mitral chordae. . Trivial mitral valve  regurgitation.   4. The aortic valve is tricuspid. Aortic valve regurgitation is not  visualized.   5. Aortic dilatation noted. There is mild dilatation of the ascending  aorta, measuring 39 mm.  Past Medical History:  Diagnosis Date   Anxiety    Arthritis    BPH (benign prostatic hyperplasia)    Cataracts, bilateral    Chronic back pain    Followed by Dr. Mavis   CKD (chronic kidney disease) stage 3, GFR 30-59 ml/min (HCC) 09/2021   Creatinine 1.47   Complication of anesthesia    unable to void after surgery   Coronary artery disease 10/31/2021   Coronary Calcium  Score 535   GERD (gastroesophageal reflux disease)    History of hiatal hernia    History of kidney surgery    left kidney removed due to cancer 2017   History of thyroid  cancer 1998   Thyroidectomy-now on Synthroid    Hyperlipidemia    Hypertension    Hypothyroidism (acquired) 1998   Post thyroidectomy for thyroid  cancer   Macular degeneration of both eyes    dry   Mixed dyslipidemia 09/27/2021   Low HDL, high triglycerides with normal LDL: TC 117, TG 135,  HDL 25, LDL 56.   Nocturia    Occipital neuralgia    PONV (postoperative nausea and vomiting)    Renal cell carcinoma, left (HCC) 2017   Left nephrectomy-Dr. Lourdes, complicated by multiple different hernia surgeries.   Renal cyst     Past Surgical History:  Procedure Laterality Date   APPENDECTOMY     COLONOSCOPY     COLONOSCOPY WITH PROPOFOL  N/A 08/14/2023   Procedure: COLONOSCOPY WITH PROPOFOL ;  Surgeon: San Sandor GAILS, DO;  Location: [facility];  Service: Gastroenterology;   Laterality: N/A;   CYSTOSCOPY WITH RETROGRADE PYELOGRAM, URETEROSCOPY AND STENT PLACEMENT Left 04/01/2016   Procedure: CYSTOSCOPY WITH RETROGRADE PYELOGRAM, left ureter;  Surgeon: Gretel Ferrara, MD;  Location: [facility];  Service: Urology;  Laterality: Left;   HEMORRHOID SURGERY     HEMOSTASIS CLIP PLACEMENT  08/14/2023   Procedure: HEMOSTASIS CLIP PLACEMENT;  Surgeon: San Sandor GAILS, DO;  Location: [facility];  Service: Gastroenterology;;   HERNIA REPAIR     inguinal-3 on left , 1 on right   INCISIONAL HERNIA REPAIR N/A 12/23/2016   Procedure: LAPAROSCOPIC VERSUS OPEN LYSIS OF ADHESIONS AND INCISIONAL HERNIA REPAIR WITH MESH;  Surgeon: Lynda Leos, MD;  Location: [facility];  Service: General;  Laterality: N/A;   INCISIONAL HERNIA REPAIR  10/08/2017   OPEN   INCISIONAL HERNIA REPAIR N/A 10/08/2017   Procedure: OPEN HERNIA REPAIR INCISIONAL ERAS PATHWAY;  Surgeon: Leos Lynda, MD;  Location: [facility];  Service: General;  Laterality: N/A;   INSERTION OF MESH N/A 12/23/2016   Procedure: INSERTION OF MESH;  Surgeon: Lynda Leos, MD;  [facility];  Service: General;  Laterality: N/A;   INSERTION OF MESH N/A 01/10/2017   Procedure: INSERTION OF MESH;  Surgeon: Lynda Leos, MD;  Location[facility];  Service: General;  Laterality: N/A;   INSERTION OF MESH N/A 10/08/2017   Procedure: INSERTION OF MESH;  Surgeon: Leos Lynda, MD;  Location: [facility] OR;  Service: General;  Laterality: N/A;   LAPAROSCOPIC NEPHRECTOMY Left 04/18/2016   Procedure: LAPAROSCOPIC RADICAL  LEFT NEPHRECTOMY;  Surgeon: Gretel Ferrara, MD;  Location: [facility];  Service: Urology;  Laterality: Left;   LAPAROSCOPY N/A 01/10/2017   Procedure: LAPAROSCOPY DIAGNOSTIC , POSSIBLE LAPAROTOMY;  Surgeon: Lynda Leos, MD;  [facility];  Service: General;  Laterality: N/A;   NASAL SINUS SURGERY     left side   NASAL SINUS SURGERY Left 10/25/2019   Procedure: ENDOSCOPIC SINUS SURGERY/ethmoid and maxillary;  Surgeon: Jesus Oliphant, MD;  Location: [facility];  Service: ENT;  Laterality: Left;   THYROIDECTOMY, PARTIAL  1998   left small amount on left side-for cancer   TONSILLECTOMY     TOTAL KNEE ARTHROPLASTY Right 11/19/2022   Procedure: RIGHT TOTAL KNEE ARTHROPLASTY;  Surgeon: Addie Cordella Hamilton, MD;  Location: [facility];  Service: Orthopedics;  Laterality: Right;    MEDICATIONS:  acetaminophen  (TYLENOL ) 325 MG tablet   alfuzosin  (UROXATRAL ) 10 MG 24 hr tablet   Ascorbic Acid  (VITAMIN C ) 100 MG CHEW   aspirin  EC 81 MG tablet   atenolol -chlorthalidone  (TENORETIC ) 50-25 MG tablet   Cholecalciferol (VITAMIN D3) 25 MCG (1000 UT) CAPS   cyanocobalamin  (VITAMIN B12) 1000 MCG tablet   dutasteride  (AVODART ) 0.5 MG capsule   ezetimibe  (ZETIA ) 10 MG tablet   gabapentin  (NEURONTIN ) 400 MG capsule   levothyroxine  (SYNTHROID ) 125 MCG tablet   Multiple Vitamins-Minerals (PRESERVISION AREDS 2+MULTI VIT PO)   nitroGLYCERIN  (NITROSTAT ) 0.4 MG SL tablet   omeprazole (PRILOSEC)  20 MG capsule   PARoxetine  (PAXIL ) 20 MG tablet   potassium chloride  SA (K-DUR,KLOR-CON ) 20 MEQ tablet   No current facility-administered medications for this encounter.     Harlene Hoots Ward, PA-C [facility] [phone]

## 2024-08-08 ENCOUNTER — Encounter: Payer: Self-pay | Admitting: Oncology

## 2024-08-08 DIAGNOSIS — D509 Iron deficiency anemia, unspecified: Secondary | ICD-10-CM | POA: Insufficient documentation

## 2024-08-08 LAB — COPPER, SERUM: Copper: 57 ug/dL — ABNORMAL LOW (ref 69–132)

## 2024-08-09 ENCOUNTER — Other Ambulatory Visit: Payer: Self-pay

## 2024-08-09 ENCOUNTER — Encounter (HOSPITAL_COMMUNITY): Payer: Self-pay | Admitting: Surgery

## 2024-08-09 ENCOUNTER — Ambulatory Visit (HOSPITAL_COMMUNITY): Payer: Self-pay | Admitting: Physician Assistant

## 2024-08-09 ENCOUNTER — Encounter: Payer: Self-pay | Admitting: Oncology

## 2024-08-09 ENCOUNTER — Ambulatory Visit (HOSPITAL_COMMUNITY): Payer: Self-pay | Admitting: Anesthesiology

## 2024-08-09 ENCOUNTER — Ambulatory Visit (HOSPITAL_COMMUNITY)
Admission: RE | Admit: 2024-08-09 | Discharge: 2024-08-09 | Disposition: A | Discharge status: Home / Self Care | Attending: Surgery | Admitting: Surgery

## 2024-08-09 ENCOUNTER — Encounter (HOSPITAL_COMMUNITY)
Admission: RE | Disposition: A | Discharge status: Home / Self Care | Payer: Self-pay | Source: Home / Self Care | Attending: Surgery

## 2024-08-09 DIAGNOSIS — K219 Gastro-esophageal reflux disease without esophagitis: Secondary | ICD-10-CM | POA: Diagnosis not present

## 2024-08-09 DIAGNOSIS — I10 Essential (primary) hypertension: Secondary | ICD-10-CM

## 2024-08-09 DIAGNOSIS — I129 Hypertensive chronic kidney disease with stage 1 through stage 4 chronic kidney disease, or unspecified chronic kidney disease: Secondary | ICD-10-CM | POA: Insufficient documentation

## 2024-08-09 DIAGNOSIS — E039 Hypothyroidism, unspecified: Secondary | ICD-10-CM | POA: Diagnosis not present

## 2024-08-09 DIAGNOSIS — I2510 Atherosclerotic heart disease of native coronary artery without angina pectoris: Secondary | ICD-10-CM | POA: Insufficient documentation

## 2024-08-09 DIAGNOSIS — N183 Chronic kidney disease, stage 3 unspecified: Secondary | ICD-10-CM | POA: Diagnosis not present

## 2024-08-09 DIAGNOSIS — Z85528 Personal history of other malignant neoplasm of kidney: Secondary | ICD-10-CM | POA: Diagnosis not present

## 2024-08-09 DIAGNOSIS — Z8711 Personal history of peptic ulcer disease: Secondary | ICD-10-CM | POA: Insufficient documentation

## 2024-08-09 DIAGNOSIS — K4091 Unilateral inguinal hernia, without obstruction or gangrene, recurrent: Secondary | ICD-10-CM | POA: Diagnosis not present

## 2024-08-09 DIAGNOSIS — Z905 Acquired absence of kidney: Secondary | ICD-10-CM | POA: Diagnosis not present

## 2024-08-09 DIAGNOSIS — K4041 Unilateral inguinal hernia, with gangrene, recurrent: Secondary | ICD-10-CM | POA: Diagnosis present

## 2024-08-09 HISTORY — PX: INGUINAL HERNIA REPAIR: SHX194

## 2024-08-09 SURGERY — REPAIR, HERNIA, INGUINAL, ADULT
Anesthesia: General | Site: Inguinal | Laterality: Right

## 2024-08-09 MED ORDER — FENTANYL CITRATE (PF) 100 MCG/2ML IJ SOLN
INTRAMUSCULAR | Status: AC
  Filled 2024-08-09: qty 2

## 2024-08-09 MED ORDER — PHENYLEPHRINE HCL (PRESSORS) 10 MG/ML IV SOLN
INTRAVENOUS | Status: AC
  Filled 2024-08-09: qty 1

## 2024-08-09 MED ORDER — ONDANSETRON HCL 4 MG/2ML IJ SOLN
INTRAMUSCULAR | Status: DC | PRN
  Administered 2024-08-09: 4 mg via INTRAVENOUS

## 2024-08-09 MED ORDER — 0.9 % SODIUM CHLORIDE (POUR BTL) OPTIME
TOPICAL | Status: DC | PRN
  Administered 2024-08-09: 1000 mL

## 2024-08-09 MED ORDER — SUGAMMADEX SODIUM 200 MG/2ML IV SOLN
INTRAVENOUS | Status: AC
Start: 2024-08-09 — End: 2024-08-09
  Filled 2024-08-09: qty 4

## 2024-08-09 MED ORDER — ROCURONIUM BROMIDE 10 MG/ML (PF) SYRINGE
PREFILLED_SYRINGE | INTRAVENOUS | Status: AC
Start: 2024-08-09 — End: 2024-08-09
  Filled 2024-08-09: qty 10

## 2024-08-09 MED ORDER — SUGAMMADEX SODIUM 200 MG/2ML IV SOLN
INTRAVENOUS | Status: DC | PRN
  Administered 2024-08-09: 200 mg via INTRAVENOUS

## 2024-08-09 MED ORDER — PHENYLEPHRINE HCL-NACL 20-0.9 MG/250ML-% IV SOLN
INTRAVENOUS | Status: DC | PRN
  Administered 2024-08-09: 50 ug/min via INTRAVENOUS

## 2024-08-09 MED ORDER — ONDANSETRON HCL 4 MG/2ML IJ SOLN
INTRAMUSCULAR | Status: AC
  Filled 2024-08-09: qty 2

## 2024-08-09 MED ORDER — ACETAMINOPHEN 500 MG PO TABS
1000.0000 mg | ORAL_TABLET | ORAL | Status: AC
Start: 2024-08-09 — End: 2024-08-09
  Administered 2024-08-09: 1000 mg via ORAL
  Filled 2024-08-09: qty 2

## 2024-08-09 MED ORDER — ORAL CARE MOUTH RINSE: 15.0000 mL | Freq: Once | OROMUCOSAL | Status: AC

## 2024-08-09 MED ORDER — FENTANYL CITRATE PF 50 MCG/ML IJ SOSY
PREFILLED_SYRINGE | INTRAMUSCULAR | Status: AC
  Filled 2024-08-09: qty 1

## 2024-08-09 MED ORDER — LIDOCAINE HCL (PF) 2 % IJ SOLN
INTRAMUSCULAR | Status: DC | PRN
  Administered 2024-08-09: 50 mg via INTRADERMAL

## 2024-08-09 MED ORDER — DEXAMETHASONE SODIUM PHOSPHATE 10 MG/ML IJ SOLN
INTRAMUSCULAR | Status: DC | PRN
Start: 2024-08-09 — End: 2024-08-09
  Administered 2024-08-09: 10 mg via INTRAVENOUS

## 2024-08-09 MED ORDER — ROCURONIUM BROMIDE 100 MG/10ML IV SOLN
INTRAVENOUS | Status: DC | PRN
Start: 2024-08-09 — End: 2024-08-09
  Administered 2024-08-09: 20 mg via INTRAVENOUS
  Administered 2024-08-09: 40 mg via INTRAVENOUS
  Administered 2024-08-09: 10 mg via INTRAVENOUS

## 2024-08-09 MED ORDER — ONDANSETRON HCL 4 MG/2ML IJ SOLN: 4.0000 mg | Freq: Once | INTRAMUSCULAR | Status: DC | PRN

## 2024-08-09 MED ORDER — BUPIVACAINE-EPINEPHRINE 0.25% -1:200000 IJ SOLN
INTRAMUSCULAR | Status: DC | PRN
  Administered 2024-08-09: 30 mL

## 2024-08-09 MED ORDER — PHENYLEPHRINE 80 MCG/ML (10ML) SYRINGE FOR IV PUSH (FOR BLOOD PRESSURE SUPPORT)
PREFILLED_SYRINGE | INTRAVENOUS | Status: AC
Start: 2024-08-09 — End: 2024-08-09
  Filled 2024-08-09: qty 10

## 2024-08-09 MED ORDER — CEFAZOLIN SODIUM-DEXTROSE 2-4 GM/100ML-% IV SOLN
2.0000 g | INTRAVENOUS | Status: AC
  Administered 2024-08-09: 2 g via INTRAVENOUS
  Filled 2024-08-09: qty 100

## 2024-08-09 MED ORDER — LACTATED RINGERS IV SOLN
INTRAVENOUS | Status: DC
Start: 2024-08-09 — End: 2024-08-09

## 2024-08-09 MED ORDER — CHLORHEXIDINE GLUCONATE 0.12 % MT SOLN
15.0000 mL | Freq: Once | OROMUCOSAL | Status: AC
  Administered 2024-08-09: 15 mL via OROMUCOSAL

## 2024-08-09 MED ORDER — CHLORHEXIDINE GLUCONATE CLOTH 2 % EX PADS: 6.0000 | MEDICATED_PAD | Freq: Once | CUTANEOUS | Status: DC

## 2024-08-09 MED ORDER — LIDOCAINE HCL (PF) 2 % IJ SOLN
INTRAMUSCULAR | Status: AC
  Filled 2024-08-09: qty 5

## 2024-08-09 MED ORDER — FENTANYL CITRATE (PF) 100 MCG/2ML IJ SOLN
INTRAMUSCULAR | Status: DC | PRN
  Administered 2024-08-09: 100 ug via INTRAVENOUS

## 2024-08-09 MED ORDER — OXYCODONE-ACETAMINOPHEN 5-325 MG PO TABS: 1.0000 | ORAL_TABLET | ORAL | 0 refills | Status: AC | PRN

## 2024-08-09 MED ORDER — AMISULPRIDE (ANTIEMETIC) 5 MG/2ML IV SOLN: 10.0000 mg | Freq: Once | INTRAVENOUS | Status: DC | PRN

## 2024-08-09 MED ORDER — PROPOFOL 10 MG/ML IV BOLUS
INTRAVENOUS | Status: AC
Start: 2024-08-09 — End: 2024-08-09
  Filled 2024-08-09: qty 20

## 2024-08-09 MED ORDER — FENTANYL CITRATE PF 50 MCG/ML IJ SOSY
25.0000 ug | PREFILLED_SYRINGE | INTRAMUSCULAR | Status: DC | PRN
  Administered 2024-08-09 (×2): 25 ug via INTRAVENOUS

## 2024-08-09 MED ORDER — CHLORHEXIDINE GLUCONATE CLOTH 2 % EX PADS
6.0000 | MEDICATED_PAD | Freq: Once | CUTANEOUS | Status: DC
Start: 2024-08-09 — End: 2024-08-09

## 2024-08-09 MED ORDER — DEXAMETHASONE SODIUM PHOSPHATE 10 MG/ML IJ SOLN
INTRAMUSCULAR | Status: AC
  Filled 2024-08-09: qty 1

## 2024-08-09 MED ORDER — LACTATED RINGERS IV SOLN: INTRAVENOUS | Status: DC | PRN

## 2024-08-09 MED ORDER — PHENYLEPHRINE HCL (PRESSORS) 10 MG/ML IV SOLN
INTRAVENOUS | Status: AC
Start: 2024-08-09 — End: 2024-08-09
  Filled 2024-08-09: qty 1

## 2024-08-09 MED ORDER — PROPOFOL 10 MG/ML IV BOLUS
INTRAVENOUS | Status: DC | PRN
  Administered 2024-08-09: 50 mg via INTRAVENOUS
  Administered 2024-08-09: 150 mg via INTRAVENOUS

## 2024-08-09 MED ORDER — BUPIVACAINE-EPINEPHRINE (PF) 0.25% -1:200000 IJ SOLN
INTRAMUSCULAR | Status: AC
  Filled 2024-08-09: qty 30

## 2024-08-09 SURGICAL SUPPLY — 30 items
BAG COUNTER SPONGE SURGICOUNT (BAG) IMPLANT
BLADE SURG 15 STRL LF DISP TIS (BLADE) ×1 IMPLANT
CHLORAPREP W/TINT 26 (MISCELLANEOUS) ×1 IMPLANT
COVER SURGICAL LIGHT HANDLE (MISCELLANEOUS) ×1 IMPLANT
DERMABOND ADVANCED .7 DNX12 (GAUZE/BANDAGES/DRESSINGS) ×1 IMPLANT
DRAIN PENROSE 0.5X18 (DRAIN) ×1 IMPLANT
DRAPE LAPAROSCOPIC ABDOMINAL (DRAPES) ×1 IMPLANT
DRAPE UTILITY XL STRL (DRAPES) ×1 IMPLANT
ELECT REM PT RETURN 15FT ADLT (MISCELLANEOUS) ×1 IMPLANT
GLOVE INDICATOR 8.0 STRL GRN (GLOVE) ×1 IMPLANT
GLOVE SS BIOGEL STRL SZ 7.5 (GLOVE) ×1 IMPLANT
GOWN STRL REUS W/ TWL XL LVL3 (GOWN DISPOSABLE) IMPLANT
KIT BASIN OR (CUSTOM PROCEDURE TRAY) ×1 IMPLANT
KIT TURNOVER KIT A (KITS) ×1 IMPLANT
MARKER SKIN DUAL TIP RULER LAB (MISCELLANEOUS) ×1 IMPLANT
MESH BARD SOFT 3X6IN (Mesh General) IMPLANT
NDL HYPO 22X1.5 SAFETY MO (MISCELLANEOUS) ×1 IMPLANT
NEEDLE HYPO 22X1.5 SAFETY MO (MISCELLANEOUS) ×1 IMPLANT
PACK BASIC VI WITH GOWN DISP (CUSTOM PROCEDURE TRAY) ×1 IMPLANT
PENCIL SMOKE EVACUATOR (MISCELLANEOUS) IMPLANT
SPIKE FLUID TRANSFER (MISCELLANEOUS) ×1 IMPLANT
SPONGE T-LAP 18X18 ~~LOC~~+RFID (SPONGE) ×1 IMPLANT
SUT MNCRL AB 4-0 PS2 18 (SUTURE) ×1 IMPLANT
SUT NOVA 0 T19/GS 22DT (SUTURE) ×1 IMPLANT
SUT PDS AB 0 CT1 36 (SUTURE) IMPLANT
SUT PDS AB 2-0 CT2 27 (SUTURE) IMPLANT
SUT VIC AB 2-0 SH 18 (SUTURE) ×1 IMPLANT
SYR CONTROL 10ML LL (SYRINGE) ×1 IMPLANT
TOWEL OR 17X26 10 PK STRL BLUE (TOWEL DISPOSABLE) ×1 IMPLANT
TRAY FOLEY MTR SLVR 16FR STAT (SET/KITS/TRAYS/PACK) IMPLANT

## 2024-08-09 NOTE — Op Note (Signed)
 Patient: Eric Suarez (1945-12-21, [id number])  Date of Surgery: 08/09/2024  Preoperative Diagnosis: RECURRENT RIGHT INGUINAL HERNIA  Postoperative Diagnosis: RECURRENT RIGHT INGUINAL HERNIA   Surgical Procedure: OPEN RECURRENT RIGHT INGUINAL HERNIA REPAIR WITH MESH   Operative Team Members:  Surgeons and Role:    * Miho Monda, Deward PARAS, MD - Primary   Anesthesiologist: Patrisha Bernardino SQUIBB, MD CRNA: Metta Andrea NOVAK, CRNA; Obadiah Reyes BROCKS, CRNA   Anesthesia: General   Fluids:  No intake/output data recorded.  Complications: None  Drains:  none   Specimen: * No specimens in log *   Disposition:  PACU - hemodynamically stable.  Plan of Care: Discharge to home after PACU    Indications for Procedure: Eric Suarez is a 78 y.o. male who presented with a right inguinal hernia, recurrent after repair in 1980s.    We talked about all the different options for repairing his hernias and which hernias to address. It seems like the right inguinal hernia is the only one that symptomatic at this time and the safest option would likely be a recurrent open right inguinal hernia repair with mesh to avoid all of the intra-abdominal procedures he has previously had with likely adhesions. It is also questionable whether or not he has a mesh already on the right side in the groin as this was done in the 1980s. So we decided proceed with open recurrent right inguinal hernia repair with mesh. We discussed the procedure itself as well as its risk, benefits, and alternatives. After full discussion all questions answered the patient granted consent to proceed.     Findings: Technique: Lichtenstein Hernia Location: right indirect inguinal hernia Mesh Size &Type:  7.5 cm tall by 15 cm wide Bard Soft Mesh Mesh Fixation: 0 novafil suture  Infection status: Patient: Private Patient Elective Case Case: Elective Infection Present At Time Of Surgery (PATOS): None   Description of Procedure:   The patient was positioned supine, padded and secured to the bed, with both arms tucked.  The abdomen was widely prepped and draped.  A time out procedure was performed.    An oblique incision was made overlying the external inguinal ring and dissection was carried down through the subcutaneous tissue until the aponeurosis of the external abdominal oblique was identified.  The aponeurosis was incised along it's fibers, and the inguinal canal was entered.  The spermatic cord was encircled with a Penrose drain. The cord was inspected. There was an indirect hernia.  The dissection was difficult due to a previous open hernia repair mesh causing dense adhesions through the plane of dissection. The indirect hernia sac was dissected off the cord, a high ligation performed with vicryl suture, then the sac stump was delivered back into the preperitoneal space.  The direct hernia sac was invaginated into the preperitoneal space and closed with 0 PDS suture.  A piece of Bard Soft mesh was opened, trimmed to 7.5 cm x 15 cm and a slit was placed down the middle, lengthwise.  One end of the mesh was rounded off and sutured directly to the pubic bone with 0 novafil.  The same suture was used, continuously, to fixate the lateral aspect of the mesh to the shelving edge of the inguinal ligament.  Medially, the mesh was positioned below the external oblique aponeurosis and fixated down to the internal oblique muscle utilizing interrupted 2-0 Vicryl suture.  No fixation was placed superior to the opening of the internal ring.  The limbs of the mesh  were placed around the spermatic cord, tucked up under the external oblique aponeurosis, and fixated laterally to the shelving edge of the inguinal ligament.  The mesh space was irrigated with saline.  The external abdominal oblique fascia was closed with a running Vicryl suture, and the external ring was reconstructed.  Scarpa's fascia was closed with Vicryl suture. The skin  closed with 4-0 Monocryl subcuticular suture and skin glue.   Deward Foy, MD General, Bariatric, & Minimally Invasive Surgery [facility], [place]

## 2024-08-09 NOTE — H&P (Signed)
 Admitting Physician: Deward PARAS Naren Benally  Service: General surgery  CC: hernia  Subjective   HPI:  Past abdominal surgery includes:  10/08/2017 - Open intraperitoneal onlay mesh repair of ventral hernia using Gore Synecore mesh 20 cm x 25cm and transfascial sutures fixed in place with #1 prolene suture with Dr. Rubin 01/10/2017 - Laparoscopic intraperitoneal onlay mesh repair of intraperiatal hernia using vicryl mesh with Dr. Rubin 12/23/2016 - Laparoscopic ventral hernia repair with bilateral posterior rectus myofascial release and 15cm x 20 cm retromuscular Bard Ventralight ST mesh placement with Dr. Rubin 04/18/2016 - Laparoscopic radical left nephrectomy with Dr. Noretta Ferrara Remote: appendectomy, inguinal hernia repair x 3 on left and x 1 on right  History of Present Illness Eric Suarez is a 78 year old male with a history of multiple hernia surgeries who presents with concerns about a recurrent inguinal hernia.  He has a long history of hernia surgeries, including multiple repairs for inguinal hernias. One of his hernias, repaired in 2007, has since 'busted' and another 'just hangs down.' A previous doctor indicated that the hernia was not ruptured but was loose and hanging down. He has had several surgeries to address these issues, including the placement of a large sheet of mesh across his abdomen.  He has a history of kidney cancer, for which he underwent a nephrectomy. Most of his recent hernias appeared after laparoscopic surgery for his cancer treatment. He was not informed that hernias were a significant risk of laparoscopic surgery, but he has experienced recurring hernias since then.  His current inguinal hernia is bothersome, particularly when standing for long periods or engaging in activities like weed eating. The hernia 'drops way down beside my back' and becomes more pronounced with activity. He is uncertain about the presence of mesh in the groin area from previous  repairs.  He recalls an emergency hernia surgery in 2017 during which his intestines were trapped, and no mesh was used. He believes the use of mesh began after his kidney surgery due to recurrent hernias.  No current pain from the hernia, but it becomes more pronounced with physical activity.   Past Medical History:  Diagnosis Date   Anxiety    Arthritis    BPH (benign prostatic hyperplasia)    Cataracts, bilateral    Chronic back pain    Followed by Dr. Mavis   CKD (chronic kidney disease) stage 3, GFR 30-59 ml/min (HCC) 09/2021   Creatinine 1.47   Complication of anesthesia    unable to void after surgery   Coronary artery disease 10/31/2021   Coronary Calcium  Score 535   GERD (gastroesophageal reflux disease)    History of hiatal hernia    History of kidney surgery    left kidney removed due to cancer 2017   History of thyroid  cancer 1998   Thyroidectomy-now on Synthroid    Hyperlipidemia    Hypertension    Hypothyroidism (acquired) 1998   Post thyroidectomy for thyroid  cancer   Macular degeneration of both eyes    dry   Mixed dyslipidemia 09/27/2021   Low HDL, high triglycerides with normal LDL: TC 117, TG 135, HDL 25, LDL 56.   Nocturia    Occipital neuralgia    PONV (postoperative nausea and vomiting)    Renal cell carcinoma, left (HCC) 2017   Left nephrectomy-Dr. Lourdes, complicated by multiple different hernia surgeries.   Renal cyst     Past Surgical History:  Procedure Laterality Date   APPENDECTOMY  COLONOSCOPY     COLONOSCOPY WITH PROPOFOL  N/A 08/14/2023   Procedure: COLONOSCOPY WITH PROPOFOL ;  Surgeon: San Sandor GAILS, DO;  Location: [facility];  Service: Gastroenterology;  Laterality: N/A;   CYSTOSCOPY WITH RETROGRADE PYELOGRAM, URETEROSCOPY AND STENT PLACEMENT Left 04/01/2016   Procedure: CYSTOSCOPY WITH RETROGRADE PYELOGRAM, left ureter;  Surgeon: Gretel Ferrara, MD;  Location: [facility];  Service: Urology;  Laterality: Left;   HEMORRHOID  SURGERY     HEMOSTASIS CLIP PLACEMENT  08/14/2023   Procedure: HEMOSTASIS CLIP PLACEMENT;  Surgeon: San Sandor GAILS, DO;  Location: [facility];  Service: Gastroenterology;;   HERNIA REPAIR     inguinal-3 on left , 1 on right   INCISIONAL HERNIA REPAIR N/A 12/23/2016   Procedure: LAPAROSCOPIC VERSUS OPEN LYSIS OF ADHESIONS AND INCISIONAL HERNIA REPAIR WITH MESH;  Surgeon: Lynda Leos, MD;  Location: [facility];  Service: General;  Laterality: N/A;   INCISIONAL HERNIA REPAIR  10/08/2017   OPEN   INCISIONAL HERNIA REPAIR N/A 10/08/2017   Procedure: OPEN HERNIA REPAIR INCISIONAL ERAS PATHWAY;  Surgeon: Leos Lynda, MD;  Location: [facility] OR;  Service: General;  Laterality: N/A;   INSERTION OF MESH N/A 12/23/2016   Procedure: INSERTION OF MESH;  Surgeon: Lynda Leos, MD;  Location: [facility];  Service: General;  Laterality: N/A;   INSERTION OF MESH N/A 01/10/2017   Procedure: INSERTION OF MESH;  Surgeon: Lynda Leos, MD;  Location: [facility];  Service: General;  Laterality: N/A;   INSERTION OF MESH N/A 10/08/2017   Procedure: INSERTION OF MESH;  Surgeon: Leos Lynda, MD;  Location: [facility] OR;  Service: General;  Laterality: N/A;   LAPAROSCOPIC NEPHRECTOMY Left 04/18/2016   Procedure: LAPAROSCOPIC RADICAL  LEFT NEPHRECTOMY;  Surgeon: Gretel Ferrara, MD;  Location: [facility];  Service: Urology;  Laterality: Left;   LAPAROSCOPY N/A 01/10/2017   Procedure: LAPAROSCOPY DIAGNOSTIC , POSSIBLE LAPAROTOMY;  Surgeon: Lynda Leos, MD;  Location: [facility];  Service: General;  Laterality: N/A;   NASAL SINUS SURGERY     left side   NASAL SINUS SURGERY Left 10/25/2019   Procedure: ENDOSCOPIC SINUS SURGERY/ethmoid and maxillary;  Surgeon: Jesus Oliphant, MD;  Location: [facility];  Service: ENT;  Laterality: Left;   THYROIDECTOMY, PARTIAL  1998   left small amount on left side-for cancer   TONSILLECTOMY     TOTAL KNEE ARTHROPLASTY Right 11/19/2022   Procedure: RIGHT TOTAL KNEE ARTHROPLASTY;   Surgeon: Addie Cordella Hamilton, MD;  Location: [facility];  Service: Orthopedics;  Laterality: Right;    Family History  Problem Relation Age of Onset   Dementia Mother    Coronary artery disease Father 31       Longstanding smoker   Heart attack Father 80       Second MI was at 85   Hypertension Father    Hyperlipidemia Father    Heart attack Brother 55       Sudden cardiac death from MI   Healthy Son    Liver disease Neg Hx    Esophageal cancer Neg Hx    Colon cancer Neg Hx     Social:  reports that he has never smoked. He has never used smokeless tobacco. He reports that he does not drink alcohol and does not use drugs.  Allergies:  Allergies  Allergen Reactions   Flomax [Tamsulosin] Nausea And Vomiting and Other (See Comments)    Uncomfortable in pelvic region   Fish Oil Hives   Rosuvastatin Calcium  Other (See Comments)  Does not remember reaction    Sulfa Antibiotics Rash    Medications: Current Outpatient Medications  Medication Instructions   acetaminophen  (TYLENOL ) 325-650 mg, Oral, Every 6 hours PRN   alfuzosin  (UROXATRAL ) 10 mg, Oral, Daily   aspirin  EC 81 mg, Daily   atenolol -chlorthalidone  (TENORETIC ) 50-25 MG tablet 0.5 tablets, Oral, Daily   cyanocobalamin  (VITAMIN B12) 1,000 mcg, Daily   dutasteride  (AVODART ) 0.5 mg, Oral, Daily at bedtime   ezetimibe  (ZETIA ) 10 mg, Oral, Daily   gabapentin  (NEURONTIN ) 400 mg, Oral, BH-each morning   levothyroxine  (SYNTHROID ) 125 mcg, Oral, Daily before breakfast   Multiple Vitamins-Minerals (PRESERVISION AREDS 2+MULTI VIT PO) 1 capsule, Oral, 2 times daily   nitroGLYCERIN  (NITROSTAT ) 0.4 mg, Every 5 min PRN   omeprazole (PRILOSEC) 20 mg, Oral, Daily at bedtime   PARoxetine  (PAXIL ) 20 mg, Oral, Daily   potassium chloride  SA (K-DUR,KLOR-CON ) 20 MEQ tablet 20 mEq, Oral, Every other day   Vitamin C  100 mg, Oral, Daily   Vitamin D3 1,000 Units, Daily    ROS - all of the below systems have been reviewed with the patient and  positives are indicated with bold text General: chills, fever or night sweats Eyes: blurry vision or double vision ENT: epistaxis or sore throat Allergy/Immunology: itchy/watery eyes or nasal congestion Hematologic/Lymphatic: bleeding problems, blood clots or swollen lymph nodes Endocrine: temperature intolerance or unexpected weight changes Breast: new or changing breast lumps or nipple discharge Resp: cough, shortness of breath, or wheezing CV: chest pain or dyspnea on exertion GI: as per HPI GU: dysuria, trouble voiding, or hematuria MSK: joint pain or joint stiffness Neuro: TIA or stroke symptoms Derm: pruritus and skin lesion changes Psych: anxiety and depression  Objective   PE Blood pressure 126/77, pulse 72, temperature 98.1 F (36.7 C), temperature source Oral, resp. rate 16, height 6' (1.829 m), weight 86.2 kg, SpO2 98%. Constitutional: NAD; conversant; no deformities Eyes: Moist conjunctiva; no lid lag; anicteric; PERRL Neck: Trachea midline; no thyromegaly Lungs: Normal respiratory effort; no tactile fremitus CV: RRR; no palpable thrills; no pitting edema GI: Abd right inguinal hernia; no palpable hepatosplenomegaly MSK: Normal range of motion of extremities; no clubbing/cyanosis Psychiatric: Appropriate affect; alert and oriented x3 Lymphatic: No palpable cervical or axillary lymphadenopathy  No results found for this or any previous visit (from the past 24 hours).  Imaging Orders  No imaging studies ordered today     Assessment and Plan   KIANO TERRIEN is an 78 y.o. male with a recurrent right inguinal hernia (previous repair in the 80s so unknown if mesh was used.    We talked about all the different options for repairing his hernias and which hernias to address. It seems like the right inguinal hernia is the only one that symptomatic at this time and the safest option would likely be a recurrent open right inguinal hernia repair with mesh to avoid all of  the intra-abdominal procedures he has previously had with likely adhesions. It is also questionable whether or not he has a mesh already on the right side in the groin as this was done in the 1980s. So we decided proceed with open recurrent right inguinal hernia repair with mesh. We discussed the procedure itself as well as its risk, benefits, and alternatives. After full discussion all questions answered the patient granted consent to proceed.  Deward JINNY Foy, MD  [facility] Use [email or web address] to contact on call provider

## 2024-08-09 NOTE — Anesthesia Preprocedure Evaluation (Addendum)
 Anesthesia Evaluation  Patient identified by MRN, date of birth, ID band Patient awake    Reviewed: Allergy & Precautions, NPO status , Patient's Chart, lab work & pertinent test results  Airway Mallampati: III  TM Distance: >3 FB Neck ROM: Full    Dental no notable dental hx.    Pulmonary neg pulmonary ROS   Pulmonary exam normal        Cardiovascular hypertension, Pt. on medications and Pt. on home beta blockers + CAD  Normal cardiovascular exam     Neuro/Psych  Headaches  Anxiety      Neuromuscular disease    GI/Hepatic Neg liver ROS, hiatal hernia, PUD,GERD  Medicated and Controlled,,  Endo/Other  Hypothyroidism    Renal/GU Renal InsufficiencyRenal disease     Musculoskeletal  (+) Arthritis ,    Abdominal   Peds  Hematology  (+) Blood dyscrasia, anemia Thrombocytopenia    Anesthesia Other Findings RIGHT INGUINAL HERNIA  Reproductive/Obstetrics                              Anesthesia Physical Anesthesia Plan  ASA: 3  Anesthesia Plan: General   Post-op Pain Management:    Induction: Intravenous  PONV Risk Score and Plan: 2 and Ondansetron , Dexamethasone  and Treatment may vary due to age or medical condition  Airway Management Planned: Oral ETT  Additional Equipment:   Intra-op Plan:   Post-operative Plan: Extubation in OR  Informed Consent: I have reviewed the patients History and Physical, chart, labs and discussed the procedure including the risks, benefits and alternatives for the proposed anesthesia with the patient or authorized representative who has indicated his/her understanding and acceptance.     Dental advisory given  Plan Discussed with: CRNA  Anesthesia Plan Comments:          Anesthesia Quick Evaluation

## 2024-08-09 NOTE — Anesthesia Postprocedure Evaluation (Signed)
 Anesthesia Post Note  Patient: Eric Suarez  Procedure(s) Performed: REPAIR RECURRENT RIGHT HERNIA, INGUINAL, ADULT (Right: Inguinal)     Patient location during evaluation: PACU Anesthesia Type: General Level of consciousness: awake Pain management: pain level controlled Vital Signs Assessment: post-procedure vital signs reviewed and stable Respiratory status: spontaneous breathing, nonlabored ventilation and respiratory function stable Cardiovascular status: blood pressure returned to baseline and stable Postop Assessment: no apparent nausea or vomiting Anesthetic complications: no   No notable events documented.  Last Vitals:  Vitals:   08/09/24 1418 08/09/24 1444  BP: 133/69 127/74  Pulse: 79 86  Resp: 16 15  Temp: 36.6 C 36.6 C  SpO2: 96% 94%    Last Pain:  Vitals:   08/09/24 1444  TempSrc:   PainSc: 2                  Kenston Longton P Becca Bayne

## 2024-08-09 NOTE — Anesthesia Procedure Notes (Signed)
 Procedure Name: Intubation Date/Time: 08/09/2024 10:55 AM  Performed by: Eric Suarez, CRNAPre-anesthesia Checklist: Patient identified, Emergency Drugs available, Suction available and Patient being monitored Patient Re-evaluated:Patient Re-evaluated prior to induction Oxygen Delivery Method: Circle System Utilized Preoxygenation: Pre-oxygenation with 100% oxygen Induction Type: IV induction Ventilation: Mask ventilation without difficulty Laryngoscope Size: Miller and 2 Grade View: Grade I Tube type: Oral Tube size: 7.5 mm Number of attempts: 1 Airway Equipment and Method: Stylet and Oral airway Placement Confirmation: ETT inserted through vocal cords under direct vision, positive ETCO2 and breath sounds checked- equal and bilateral Secured at: 23 cm Tube secured with: Tape Dental Injury: Teeth and Oropharynx as per pre-operative assessment

## 2024-08-09 NOTE — Transfer of Care (Signed)
 Immediate Anesthesia Transfer of Care Note  Patient: Eric Suarez  Procedure(s) Performed: REPAIR RECURRENT RIGHT HERNIA, INGUINAL, ADULT (Right: Inguinal)  Patient Location: PACU  Anesthesia Type:General  Level of Consciousness: awake, alert , and oriented  Airway & Oxygen Therapy: Patient Spontanous Breathing and Patient connected to nasal cannula oxygen  Post-op Assessment: Report given to RN and Post -op Vital signs reviewed and stable  Post vital signs: Reviewed and stable  Last Vitals:  Vitals Value Taken Time  BP 121/72 08/09/24 13:00  Temp 36.4 C 08/09/24 12:54  Pulse 77 08/09/24 13:07  Resp 19 08/09/24 13:07  SpO2 100 % 08/09/24 13:07  Vitals shown include unfiled device data.  Last Pain:  Vitals:   08/09/24 1300  TempSrc:   PainSc: Asleep         Complications: No notable events documented.

## 2024-08-09 NOTE — Discharge Instructions (Signed)

## 2024-08-10 ENCOUNTER — Encounter (HOSPITAL_COMMUNITY): Payer: Self-pay | Admitting: Surgery

## 2024-08-23 ENCOUNTER — Telehealth: Payer: Self-pay | Admitting: Oncology

## 2024-08-23 NOTE — Telephone Encounter (Signed)
 Patient has been scheduled. Aware of appt dates and times.   Scheduling Message Entered by AFTON DEVERE HERO on 08/20/2024 at  4:34 PM Priority: Routine <No visit type provided>  Department: [facility]  Provider:  Appointment Notes:  Did this pt ever agree to copper  infusions?

## 2024-08-30 ENCOUNTER — Other Ambulatory Visit: Payer: Self-pay | Admitting: Pharmacist

## 2024-08-30 ENCOUNTER — Inpatient Hospital Stay: Attending: Oncology

## 2024-08-30 ENCOUNTER — Inpatient Hospital Stay

## 2024-08-30 VITALS — BP 118/73 | HR 70 | Temp 98.0°F | Resp 18

## 2024-08-30 DIAGNOSIS — D696 Thrombocytopenia, unspecified: Secondary | ICD-10-CM | POA: Insufficient documentation

## 2024-08-30 DIAGNOSIS — E610 Copper deficiency: Secondary | ICD-10-CM | POA: Diagnosis not present

## 2024-08-30 DIAGNOSIS — Z79899 Other long term (current) drug therapy: Secondary | ICD-10-CM | POA: Insufficient documentation

## 2024-08-30 DIAGNOSIS — D649 Anemia, unspecified: Secondary | ICD-10-CM | POA: Diagnosis not present

## 2024-08-30 MED ORDER — SODIUM CHLORIDE 0.9 % IV SOLN: Freq: Once | INTRAVENOUS | Status: DC

## 2024-08-30 MED ORDER — ONDANSETRON HCL 4 MG/2ML IJ SOLN
8.0000 mg | Freq: Once | INTRAMUSCULAR | Status: AC
  Administered 2024-08-30: 8 mg via INTRAVENOUS
  Filled 2024-08-30: qty 4

## 2024-08-30 MED ORDER — LORATADINE 10 MG PO TABS
10.0000 mg | ORAL_TABLET | Freq: Once | ORAL | Status: AC
  Administered 2024-08-30: 10 mg via ORAL
  Filled 2024-08-30: qty 1

## 2024-08-30 MED ORDER — SODIUM CHLORIDE 0.9 % IV SOLN
1000.0000 mg | Freq: Once | INTRAVENOUS | Status: AC
  Administered 2024-08-30: 1000 mg via INTRAVENOUS
  Filled 2024-08-30: qty 10

## 2024-08-30 MED ORDER — SODIUM CHLORIDE 0.9 % IV SOLN
2.0000 mg | Freq: Once | INTRAVENOUS | Status: AC
  Administered 2024-08-30: 2 mg via INTRAVENOUS
  Filled 2024-08-30: qty 5

## 2024-08-30 MED ORDER — DEXTROSE 5 % IV SOLN
2.0000 mg | Freq: Once | INTRAVENOUS | Status: DC
  Filled 2024-08-30: qty 5

## 2024-08-30 MED ORDER — ACETAMINOPHEN 325 MG PO TABS
650.0000 mg | ORAL_TABLET | Freq: Once | ORAL | Status: AC
  Administered 2024-08-30: 650 mg via ORAL
  Filled 2024-08-30: qty 2

## 2024-08-30 MED FILL — Cupric Chloride Inj 0.4 MG/ML (Elemental): INTRAVENOUS | Qty: 5 | Status: AC

## 2024-08-31 ENCOUNTER — Inpatient Hospital Stay

## 2024-08-31 VITALS — BP 109/79 | HR 74 | Temp 98.1°F | Resp 18

## 2024-08-31 DIAGNOSIS — E610 Copper deficiency: Secondary | ICD-10-CM

## 2024-08-31 DIAGNOSIS — D649 Anemia, unspecified: Secondary | ICD-10-CM | POA: Diagnosis not present

## 2024-08-31 MED ORDER — SODIUM CHLORIDE 0.9 % IV SOLN
2.0000 mg | Freq: Every day | INTRAVENOUS | Status: DC
  Administered 2024-08-31: 2 mg via INTRAVENOUS
  Filled 2024-08-31: qty 5

## 2024-08-31 MED ORDER — ONDANSETRON HCL 4 MG/2ML IJ SOLN
8.0000 mg | Freq: Once | INTRAMUSCULAR | Status: AC
  Administered 2024-08-31: 8 mg via INTRAVENOUS
  Filled 2024-08-31: qty 4

## 2024-08-31 MED ORDER — SODIUM CHLORIDE 0.9 % IV SOLN: Freq: Once | INTRAVENOUS | Status: AC

## 2024-08-31 MED ORDER — LORATADINE 10 MG PO TABS
10.0000 mg | ORAL_TABLET | Freq: Once | ORAL | Status: AC
  Administered 2024-08-31: 10 mg via ORAL
  Filled 2024-08-31: qty 1

## 2024-08-31 NOTE — Patient Instructions (Signed)
 Copper  Injection What is this medication? COPPER  (KOP er) prevents and treats low levels of copper  in your body. Copper  is a mineral that plays an important role in forming red blood cells. It also helps maintain nervous and immune system health. This medicine may be used for other purposes; ask your health care provider or pharmacist if you have questions. What should I tell my care team before I take this medication? They need to know if you have any of these conditions: Kidney disease Liver disease Low levels of zinc  in the blood Wilson disease An unusual or allergic reaction to copper , other medications, foods, dyes, or preservatives Pregnant or trying to get pregnant Breastfeeding How should I use this medication? This medication is infused into a vein. It is given by your care team in a hospital or clinic setting. Talk to your care team about the use of this medication in children. While it may be prescribed to children for selected conditions, precautions do apply. Overdosage: If you think you have taken too much of this medicine contact a poison control center or emergency room at once. NOTE: This medicine is only for you. Do not share this medicine with others. What if I miss a dose? This does not apply. What may interact with this medication? Interactions are not expected. This list may not describe all possible interactions. Give your health care provider a list of all the medicines, herbs, non-prescription drugs, or dietary supplements you use. Also tell them if you smoke, drink alcohol, or use illegal drugs. Some items may interact with your medicine. What should I watch for while using this medication? Your condition will be monitored carefully while you are receiving this medication. You may need blood work done while you are taking this medication. What side effects may I notice from receiving this medication? Side effects that you should report to your care team as soon as  possible: Allergic reactions--skin rash, itching, hives, swelling of the face, lips, tongue, or throat This list may not describe all possible side effects. Call your doctor for medical advice about side effects. You may report side effects to FDA at 1-800-FDA-1088. Where should I keep my medication? This medication is given in a hospital or clinic. It will not be stored at home. NOTE: This sheet is a summary. It may not cover all possible information. If you have questions about this medicine, talk to your doctor, pharmacist, or health care provider.  2024 Elsevier/Gold Standard (2023-10-10 00:00:00)

## 2024-09-01 ENCOUNTER — Inpatient Hospital Stay

## 2024-09-01 VITALS — BP 104/77 | HR 71 | Temp 97.9°F | Resp 18

## 2024-09-01 DIAGNOSIS — D649 Anemia, unspecified: Secondary | ICD-10-CM | POA: Diagnosis not present

## 2024-09-01 DIAGNOSIS — E610 Copper deficiency: Secondary | ICD-10-CM

## 2024-09-01 MED ORDER — LORATADINE 10 MG PO TABS
10.0000 mg | ORAL_TABLET | Freq: Once | ORAL | Status: AC
  Administered 2024-09-01: 10 mg via ORAL
  Filled 2024-09-01: qty 1

## 2024-09-01 MED ORDER — ONDANSETRON HCL 4 MG/2ML IJ SOLN
8.0000 mg | Freq: Once | INTRAMUSCULAR | Status: AC
  Administered 2024-09-01: 8 mg via INTRAVENOUS
  Filled 2024-09-01: qty 4

## 2024-09-01 MED ORDER — SODIUM CHLORIDE 0.9 % IV SOLN: Freq: Once | INTRAVENOUS | Status: AC

## 2024-09-01 MED ORDER — SODIUM CHLORIDE 0.9 % IV SOLN
2.0000 mg | Freq: Once | INTRAVENOUS | Status: AC
  Administered 2024-09-01: 2 mg via INTRAVENOUS
  Filled 2024-09-01: qty 5

## 2024-09-01 MED FILL — Cupric Chloride Inj 0.4 MG/ML (Elemental): INTRAVENOUS | Qty: 5 | Status: AC

## 2024-09-01 NOTE — Patient Instructions (Signed)
 Copper  Injection What is this medication? COPPER  (KOP er) prevents and treats low levels of copper  in your body. Copper  is a mineral that plays an important role in forming red blood cells. It also helps maintain nervous and immune system health. This medicine may be used for other purposes; ask your health care provider or pharmacist if you have questions. What should I tell my care team before I take this medication? They need to know if you have any of these conditions: Kidney disease Liver disease Low levels of zinc  in the blood Wilson disease An unusual or allergic reaction to copper , other medications, foods, dyes, or preservatives Pregnant or trying to get pregnant Breastfeeding How should I use this medication? This medication is infused into a vein. It is given by your care team in a hospital or clinic setting. Talk to your care team about the use of this medication in children. While it may be prescribed to children for selected conditions, precautions do apply. Overdosage: If you think you have taken too much of this medicine contact a poison control center or emergency room at once. NOTE: This medicine is only for you. Do not share this medicine with others. What if I miss a dose? This does not apply. What may interact with this medication? Interactions are not expected. This list may not describe all possible interactions. Give your health care provider a list of all the medicines, herbs, non-prescription drugs, or dietary supplements you use. Also tell them if you smoke, drink alcohol, or use illegal drugs. Some items may interact with your medicine. What should I watch for while using this medication? Your condition will be monitored carefully while you are receiving this medication. You may need blood work done while you are taking this medication. What side effects may I notice from receiving this medication? Side effects that you should report to your care team as soon as  possible: Allergic reactions--skin rash, itching, hives, swelling of the face, lips, tongue, or throat This list may not describe all possible side effects. Call your doctor for medical advice about side effects. You may report side effects to FDA at 1-800-FDA-1088. Where should I keep my medication? This medication is given in a hospital or clinic. It will not be stored at home. NOTE: This sheet is a summary. It may not cover all possible information. If you have questions about this medicine, talk to your doctor, pharmacist, or health care provider.  2024 Elsevier/Gold Standard (2023-10-10 00:00:00)

## 2024-09-02 ENCOUNTER — Inpatient Hospital Stay

## 2024-09-02 VITALS — BP 104/69 | HR 58 | Temp 97.7°F | Resp 20

## 2024-09-02 DIAGNOSIS — E610 Copper deficiency: Secondary | ICD-10-CM

## 2024-09-02 DIAGNOSIS — D649 Anemia, unspecified: Secondary | ICD-10-CM | POA: Diagnosis not present

## 2024-09-02 MED ORDER — SODIUM CHLORIDE 0.9 % IV SOLN
2.0000 mg | Freq: Once | INTRAVENOUS | Status: AC
  Administered 2024-09-02: 2 mg via INTRAVENOUS
  Filled 2024-09-02: qty 5

## 2024-09-02 MED ORDER — ONDANSETRON HCL 4 MG/2ML IJ SOLN
8.0000 mg | Freq: Once | INTRAMUSCULAR | Status: AC
  Administered 2024-09-02: 8 mg via INTRAVENOUS
  Filled 2024-09-02: qty 4

## 2024-09-02 MED ORDER — LORATADINE 10 MG PO TABS
10.0000 mg | ORAL_TABLET | Freq: Every day | ORAL | Status: DC
  Administered 2024-09-02: 10 mg via ORAL
  Filled 2024-09-02: qty 1

## 2024-09-02 MED ORDER — SODIUM CHLORIDE 0.9 % IV SOLN: Freq: Once | INTRAVENOUS | Status: AC

## 2024-09-02 MED FILL — Cupric Chloride Inj 0.4 MG/ML (Elemental): INTRAVENOUS | Qty: 5 | Status: AC

## 2024-09-02 NOTE — Patient Instructions (Signed)
 Copper  Injection What is this medication? COPPER  (KOP er) prevents and treats low levels of copper  in your body. Copper  is a mineral that plays an important role in forming red blood cells. It also helps maintain nervous and immune system health. This medicine may be used for other purposes; ask your health care provider or pharmacist if you have questions. What should I tell my care team before I take this medication? They need to know if you have any of these conditions: Kidney disease Liver disease Low levels of zinc  in the blood Wilson disease An unusual or allergic reaction to copper , other medications, foods, dyes, or preservatives Pregnant or trying to get pregnant Breastfeeding How should I use this medication? This medication is infused into a vein. It is given by your care team in a hospital or clinic setting. Talk to your care team about the use of this medication in children. While it may be prescribed to children for selected conditions, precautions do apply. Overdosage: If you think you have taken too much of this medicine contact a poison control center or emergency room at once. NOTE: This medicine is only for you. Do not share this medicine with others. What if I miss a dose? This does not apply. What may interact with this medication? Interactions are not expected. This list may not describe all possible interactions. Give your health care provider a list of all the medicines, herbs, non-prescription drugs, or dietary supplements you use. Also tell them if you smoke, drink alcohol, or use illegal drugs. Some items may interact with your medicine. What should I watch for while using this medication? Your condition will be monitored carefully while you are receiving this medication. You may need blood work done while you are taking this medication. What side effects may I notice from receiving this medication? Side effects that you should report to your care team as soon as  possible: Allergic reactions--skin rash, itching, hives, swelling of the face, lips, tongue, or throat This list may not describe all possible side effects. Call your doctor for medical advice about side effects. You may report side effects to FDA at 1-800-FDA-1088. Where should I keep my medication? This medication is given in a hospital or clinic. It will not be stored at home. NOTE: This sheet is a summary. It may not cover all possible information. If you have questions about this medicine, talk to your doctor, pharmacist, or health care provider.  2024 Elsevier/Gold Standard (2023-10-10 00:00:00)

## 2024-09-03 ENCOUNTER — Inpatient Hospital Stay

## 2024-09-03 VITALS — BP 124/74 | HR 59 | Temp 97.7°F | Resp 20

## 2024-09-03 DIAGNOSIS — E610 Copper deficiency: Secondary | ICD-10-CM

## 2024-09-03 DIAGNOSIS — D649 Anemia, unspecified: Secondary | ICD-10-CM | POA: Diagnosis not present

## 2024-09-03 MED ORDER — LORATADINE 10 MG PO TABS
10.0000 mg | ORAL_TABLET | Freq: Every day | ORAL | Status: DC
  Administered 2024-09-03: 10 mg via ORAL
  Filled 2024-09-03: qty 1

## 2024-09-03 MED ORDER — ONDANSETRON HCL 4 MG/2ML IJ SOLN
8.0000 mg | Freq: Once | INTRAMUSCULAR | Status: AC
  Administered 2024-09-03: 8 mg via INTRAVENOUS
  Filled 2024-09-03: qty 4

## 2024-09-03 MED ORDER — SODIUM CHLORIDE 0.9 % IV SOLN
2.0000 mg | Freq: Once | INTRAVENOUS | Status: AC
  Administered 2024-09-03: 2 mg via INTRAVENOUS
  Filled 2024-09-03: qty 5

## 2024-09-03 MED ORDER — SODIUM CHLORIDE 0.9 % IV SOLN: Freq: Once | INTRAVENOUS | Status: AC

## 2024-09-03 NOTE — Patient Instructions (Signed)
 Copper  Injection What is this medication? COPPER  (KOP er) prevents and treats low levels of copper  in your body. Copper  is a mineral that plays an important role in forming red blood cells. It also helps maintain nervous and immune system health. This medicine may be used for other purposes; ask your health care provider or pharmacist if you have questions. What should I tell my care team before I take this medication? They need to know if you have any of these conditions: Kidney disease Liver disease Low levels of zinc  in the blood Wilson disease An unusual or allergic reaction to copper , other medications, foods, dyes, or preservatives Pregnant or trying to get pregnant Breastfeeding How should I use this medication? This medication is infused into a vein. It is given by your care team in a hospital or clinic setting. Talk to your care team about the use of this medication in children. While it may be prescribed to children for selected conditions, precautions do apply. Overdosage: If you think you have taken too much of this medicine contact a poison control center or emergency room at once. NOTE: This medicine is only for you. Do not share this medicine with others. What if I miss a dose? This does not apply. What may interact with this medication? Interactions are not expected. This list may not describe all possible interactions. Give your health care provider a list of all the medicines, herbs, non-prescription drugs, or dietary supplements you use. Also tell them if you smoke, drink alcohol, or use illegal drugs. Some items may interact with your medicine. What should I watch for while using this medication? Your condition will be monitored carefully while you are receiving this medication. You may need blood work done while you are taking this medication. What side effects may I notice from receiving this medication? Side effects that you should report to your care team as soon as  possible: Allergic reactions--skin rash, itching, hives, swelling of the face, lips, tongue, or throat This list may not describe all possible side effects. Call your doctor for medical advice about side effects. You may report side effects to FDA at 1-800-FDA-1088. Where should I keep my medication? This medication is given in a hospital or clinic. It will not be stored at home. NOTE: This sheet is a summary. It may not cover all possible information. If you have questions about this medicine, talk to your doctor, pharmacist, or health care provider.  2024 Elsevier/Gold Standard (2023-10-10 00:00:00)

## 2024-09-06 ENCOUNTER — Ambulatory Visit

## 2024-09-06 DIAGNOSIS — R0981 Nasal congestion: Secondary | ICD-10-CM | POA: Diagnosis not present

## 2024-09-06 DIAGNOSIS — R051 Acute cough: Secondary | ICD-10-CM | POA: Diagnosis not present

## 2024-09-06 DIAGNOSIS — R070 Pain in throat: Secondary | ICD-10-CM | POA: Diagnosis not present

## 2024-09-06 DIAGNOSIS — J069 Acute upper respiratory infection, unspecified: Secondary | ICD-10-CM | POA: Diagnosis not present

## 2024-09-13 ENCOUNTER — Encounter: Payer: Self-pay | Admitting: Radiology

## 2024-09-17 DIAGNOSIS — R0981 Nasal congestion: Secondary | ICD-10-CM | POA: Diagnosis not present

## 2024-09-17 DIAGNOSIS — R051 Acute cough: Secondary | ICD-10-CM | POA: Diagnosis not present

## 2024-10-05 ENCOUNTER — Other Ambulatory Visit: Payer: Self-pay | Admitting: Surgical

## 2024-10-05 ENCOUNTER — Telehealth: Payer: Self-pay | Admitting: *Deleted

## 2024-10-05 MED ORDER — AMOXICILLIN 500 MG PO TABS: 2000.0000 mg | ORAL_TABLET | Freq: Once | ORAL | 0 refills | Status: AC

## 2024-10-05 NOTE — Telephone Encounter (Signed)
 Sent in

## 2024-10-05 NOTE — Telephone Encounter (Signed)
 Patient called requesting refill of antibiotic prior to dental appointment if still needed. DOS: 11/19/22 total knee. Thank you.

## 2024-10-13 DIAGNOSIS — I10 Essential (primary) hypertension: Secondary | ICD-10-CM | POA: Diagnosis not present

## 2024-10-13 DIAGNOSIS — Z125 Encounter for screening for malignant neoplasm of prostate: Secondary | ICD-10-CM | POA: Diagnosis not present

## 2024-10-19 ENCOUNTER — Ambulatory Visit: Admitting: Gastroenterology

## 2024-12-06 ENCOUNTER — Inpatient Hospital Stay

## 2024-12-06 ENCOUNTER — Inpatient Hospital Stay: Admitting: Oncology

## 2024-12-07 ENCOUNTER — Other Ambulatory Visit: Payer: Self-pay | Admitting: Oncology

## 2024-12-07 DIAGNOSIS — D508 Other iron deficiency anemias: Secondary | ICD-10-CM

## 2024-12-07 DIAGNOSIS — E610 Copper deficiency: Secondary | ICD-10-CM

## 2024-12-07 NOTE — Progress Notes (Signed)
 " [facility] at [facility] [address] [place],  [place]  [address] [phone]  Clinic Day:  12/08/2024  Referring physician: Clarice Nottingham, MD   HISTORY OF PRESENT ILLNESS:  The patient is a 79 y.o. male with thrombocytopenia and anemia.  Per hospital records, this gentleman has had borderline thrombocytopenia dating back to at least 2018.  A recent abdominal CT scan revealed splenomegaly, which may be the reason behind his baseline low platelets.  Of note, this gentleman also has been anemic secondary to both copper  and iron deficiencies. His copper  deficiency was likely due to him having taken zinc  on a daily basis for decades, which he no longer does.  He comes in today to reassess his peripheral counts after receiving both IV iron and IV copper  in October 2025.  Since then, the patient has been doing very well.  He denies having increased fatigue or any overt forms of blood loss which concern him for progressive anemia.  However, he has had increased diarrhea over these past few months.  PHYSICAL EXAM:  Blood pressure 124/78, pulse 62, temperature (!) 97.4 F (36.3 C), temperature source Oral, resp. rate 16, height 5' 11 (1.803 m), weight 197 lb 6.4 oz (89.5 kg), SpO2 97%. Wt Readings from Last 3 Encounters:  12/08/24 197 lb 6.4 oz (89.5 kg)  08/09/24 190 lb (86.2 kg)  08/06/24 190 lb (86.2 kg)   Body mass index is 27.53 kg/m. Performance status (ECOG): 1 - Symptomatic but completely ambulatory Physical Exam Constitutional:      Appearance: Normal appearance. He is not ill-appearing.  HENT:     Mouth/Throat:     Mouth: Mucous membranes are moist.     Pharynx: Oropharynx is clear. No oropharyngeal exudate or posterior oropharyngeal erythema.  Cardiovascular:     Rate and Rhythm: Normal rate and regular rhythm.     Heart sounds: No murmur heard.    No friction rub. No gallop.  Pulmonary:     Effort: Pulmonary effort is normal. No respiratory distress.      Breath sounds: Normal breath sounds. No wheezing, rhonchi or rales.  Abdominal:     General: Bowel sounds are normal. There is no distension.     Palpations: Abdomen is soft. There is no mass.     Tenderness: There is no abdominal tenderness.  Musculoskeletal:        General: No swelling.     Cervical back: No tenderness.     Right lower leg: No edema.     Left lower leg: No edema.  Lymphadenopathy:     Cervical: No cervical adenopathy.     Upper Body:     Right upper body: No supraclavicular or axillary adenopathy.     Left upper body: No supraclavicular or axillary adenopathy.     Lower Body: No right inguinal adenopathy. No left inguinal adenopathy.  Skin:    General: Skin is warm.     Coloration: Skin is not jaundiced.     Findings: No lesion or rash.  Neurological:     General: No focal deficit present.     Mental Status: He is alert and oriented to person, place, and time. Mental status is at baseline.  Psychiatric:        Mood and Affect: Mood normal.        Behavior: Behavior normal.        Thought Content: Thought content normal.   SCANS:  CT scans of his abdomen/pelvis 08/05/2024 revealed  the following: FINDINGS: Lower chest: Heart is normal size. Calcified plaque over the left main, left anterior descending and left lateral circumflex coronary arteries. Mild tortuosity of the descending thoracic aorta. Lung bases demonstrate minimal linear bibasilar scarring.   Hepatobiliary: Liver and biliary tree are normal. Calcified gallbladder.   Pancreas: Normal.   Spleen: Evidence of splenomegaly as the spleen measures 17.6 cm in greatest diameter (previously 15.5 cm).   Adrenals/Urinary Tract: Adrenal glands are normal. Evidence of patient's previous left nephrectomy with stable surgical bed. Right kidney is normal in size without hydronephrosis or nephrolithiasis. Several small right renal cysts without significant change. Right ureter is normal. Left ureter is  normal 1.5 cm stone over the dependent portion of the right-side of the bladder adjacent the UVJ. This has increased in size since the prior exam. 4-5 mm stone over the left lateral dependent portion of the bladder adjacent the left UVJ without significant change.   Stomach/Bowel: Stomach is normal. Small bowel is unremarkable. Previous appendectomy. Diverticulosis of the colon.   There is wall thickening with mild adjacent inflammatory change involving the ascending colon as well as a short segment of the sigmoid colon near the junction of the rectum in the midline pelvis. Findings suggest acute colitis which may be of infectious or inflammatory origin. Minimal free fluid over the right pericolic gutter and right perihepatic region.   Vascular/Lymphatic: Calcified plaque over the abdominal aorta which is normal caliber. Remaining vascular structures are unremarkable. No adenopathy.   Reproductive: Prostate is unremarkable.   Other: Postsurgical changes over the midline anterior abdominal wall and evidence of previous ventral hernia repair. Small ventral hernia containing only peritoneal fat just right of midline over the upper abdomen above the hernia repair. Fluid over the right inguinal canal extending towards the scrotum.   Musculoskeletal: Degenerative changes of the spine and hips. Multilevel disc disease over the lumbar spine.   IMPRESSION: 1. Evidence of acute colitis involving the ascending colon as well as a short segment of the sigmoid colon near the junction of the rectum in the midline pelvis. This may be of infectious or inflammatory origin. Minimal free fluid over the right pericolic gutter and right perihepatic region. 2. Diverticulosis of the colon. 3. Splenomegaly as the spleen measures 17.6 cm in greatest diameter (previously 15.5 cm). 4. Cholelithiasis. 5. Evidence of patient's previous left nephrectomy with stable surgical bed. 6. Aortic  atherosclerosis. Atherosclerotic coronary artery disease. 7. Small ventral hernia containing only peritoneal fat just right of midline over the upper abdomen above the hernia repair. 8. Fluid over the right inguinal canal extending to the scrotum.   Aortic Atherosclerosis (ICD10-I70.0).   LABS:      Latest Ref Rng & Units 12/08/2024    9:28 AM 08/05/2024    1:01 PM 03/29/2024    2:32 PM  CBC  WBC 4.0 - 10.5 K/uL 6.6  5.4  4.8   Hemoglobin 13.0 - 17.0 g/dL 86.2  87.0  86.9   Hematocrit 39.0 - 52.0 % 41.0  38.1  38.9   Platelets 150 - 400 K/uL 75  65  74       Latest Ref Rng & Units 12/08/2024    9:28 AM 08/05/2024    1:01 PM 03/29/2024    2:32 PM  CMP  Glucose 70 - 99 mg/dL 816  883  73   BUN 8 - 23 mg/dL 21  20  23    Creatinine 0.61 - 1.24 mg/dL 8.54  8.63  8.55  Sodium 135 - 145 mmol/L 139  139  140   Potassium 3.5 - 5.1 mmol/L 4.6  4.0  4.5   Chloride 98 - 111 mmol/L 105  106  108   CO2 22 - 32 mmol/L 24  21  22    Calcium  8.9 - 10.3 mg/dL 9.3  8.8  9.6   Total Protein 6.5 - 8.1 g/dL 6.7  6.4  6.5   Total Bilirubin 0.0 - 1.2 mg/dL 1.1  1.1  0.7   Alkaline Phos 38 - 126 U/L 103  76  77   AST 15 - 41 U/L 24  17  16    ALT 0 - 44 U/L 59  14  14     Latest Reference Range & Units 08/05/24 13:01 12/08/24 09:28  Iron 45 - 182 ug/dL 48 76  UIBC ug/dL 702 767  TIBC 749 - 549 ug/dL 655 691  Saturation Ratios 17.9 - 39.5 % 14 (L) 25  Ferritin 24 - 336 ng/mL 25 160  (L): Data is abnormally low   Latest Reference Range & Units 12/08/24 09:28  Copper  66 - 121 ug/dL 61 (L)  (L): Data is abnormally low  ASSESSMENT & PLAN:  Assessment/Plan:  A 79 y.o. male with known mild thrombocytopenia and anemia.  With respect to his anemia, I am pleased as his hemoglobin has improved after receiving both IV iron and IV copper .  His nutritional parameters today still show a persistent, but mild, copper  deficiency.  I will arrange for him to receive yet another course of IV copper  over these next  few weeks.  His copper  deficiency is not particularly confusing as he took zinc  on a daily basis for 30 years.  Zinc  is known to lead to copper  deficiency over time.  With respect to his thrombocytopenia, it remains stable.  As mentioned previously, recent scans did show splenomegaly.  If there is ever a significant decline in his platelets or other peripheral counts, a splenectomy would be contemplated.  Clinically, the patient appears to be doing much better.  I will see him back in 6 months for repeat clinical assessment.  The patient understands all the plans discussed today and is in agreement with them.    Treonna Klee DELENA Kerns, MD       "

## 2024-12-08 ENCOUNTER — Inpatient Hospital Stay: Attending: Oncology

## 2024-12-08 ENCOUNTER — Other Ambulatory Visit: Payer: Self-pay | Admitting: Oncology

## 2024-12-08 ENCOUNTER — Inpatient Hospital Stay: Admitting: Oncology

## 2024-12-08 ENCOUNTER — Telehealth: Payer: Self-pay | Admitting: Oncology

## 2024-12-08 VITALS — BP 124/78 | HR 62 | Temp 97.4°F | Resp 16 | Ht 71.0 in | Wt 197.4 lb

## 2024-12-08 DIAGNOSIS — R197 Diarrhea, unspecified: Secondary | ICD-10-CM | POA: Diagnosis not present

## 2024-12-08 DIAGNOSIS — E610 Copper deficiency: Secondary | ICD-10-CM

## 2024-12-08 DIAGNOSIS — D508 Other iron deficiency anemias: Secondary | ICD-10-CM

## 2024-12-08 LAB — CBC WITH DIFFERENTIAL (CANCER CENTER ONLY)
Abs Immature Granulocytes: 0.04 10*3/uL (ref 0.00–0.07)
Basophils Absolute: 0 10*3/uL (ref 0.0–0.1)
Basophils Relative: 1 %
Eosinophils Absolute: 0.1 10*3/uL (ref 0.0–0.5)
Eosinophils Relative: 2 %
HCT: 41 % (ref 39.0–52.0)
Hemoglobin: 13.7 g/dL (ref 13.0–17.0)
Immature Granulocytes: 1 %
Lymphocytes Relative: 19 %
Lymphs Abs: 1.3 10*3/uL (ref 0.7–4.0)
MCH: 30.4 pg (ref 26.0–34.0)
MCHC: 33.4 g/dL (ref 30.0–36.0)
MCV: 90.9 fL (ref 80.0–100.0)
Monocytes Absolute: 0.5 10*3/uL (ref 0.1–1.0)
Monocytes Relative: 7 %
Neutro Abs: 4.7 10*3/uL (ref 1.7–7.7)
Neutrophils Relative %: 70 %
Platelet Count: 75 10*3/uL — ABNORMAL LOW (ref 150–400)
RBC: 4.51 MIL/uL (ref 4.22–5.81)
RDW: 17.2 % — ABNORMAL HIGH (ref 11.5–15.5)
WBC Count: 6.6 10*3/uL (ref 4.0–10.5)
nRBC: 0 % (ref 0.0–0.2)

## 2024-12-08 LAB — CMP (CANCER CENTER ONLY)
ALT: 59 U/L — ABNORMAL HIGH (ref 0–44)
AST: 24 U/L (ref 15–41)
Albumin: 4.3 g/dL (ref 3.5–5.0)
Alkaline Phosphatase: 103 U/L (ref 38–126)
Anion gap: 11 (ref 5–15)
BUN: 21 mg/dL (ref 8–23)
CO2: 24 mmol/L (ref 22–32)
Calcium: 9.3 mg/dL (ref 8.9–10.3)
Chloride: 105 mmol/L (ref 98–111)
Creatinine: 1.45 mg/dL — ABNORMAL HIGH (ref 0.61–1.24)
GFR, Estimated: 49 mL/min — ABNORMAL LOW
Glucose, Bld: 183 mg/dL — ABNORMAL HIGH (ref 70–99)
Potassium: 4.6 mmol/L (ref 3.5–5.1)
Sodium: 139 mmol/L (ref 135–145)
Total Bilirubin: 1.1 mg/dL (ref 0.0–1.2)
Total Protein: 6.7 g/dL (ref 6.5–8.1)

## 2024-12-08 LAB — IRON AND TIBC
Iron: 76 ug/dL (ref 45–182)
Saturation Ratios: 25 % (ref 17.9–39.5)
TIBC: 308 ug/dL (ref 250–450)
UIBC: 232 ug/dL

## 2024-12-08 LAB — FERRITIN: Ferritin: 160 ng/mL (ref 24–336)

## 2024-12-08 LAB — FOLATE: Folate: 15.8 ng/mL

## 2024-12-08 LAB — VITAMIN B12: Vitamin B-12: 1750 pg/mL — ABNORMAL HIGH (ref 180–914)

## 2024-12-08 NOTE — Telephone Encounter (Signed)
 Patient has been scheduled for follow-up visit per 12/08/2024 LOS.  Pt given an appt calendar with date and time.

## 2024-12-10 LAB — COPPER, SERUM: Copper: 61 ug/dL — ABNORMAL LOW (ref 66–121)

## 2024-12-13 ENCOUNTER — Encounter: Payer: Self-pay | Admitting: Oncology

## 2024-12-14 ENCOUNTER — Telehealth: Payer: Self-pay

## 2024-12-27 ENCOUNTER — Inpatient Hospital Stay

## 2024-12-28 ENCOUNTER — Inpatient Hospital Stay

## 2024-12-29 ENCOUNTER — Inpatient Hospital Stay

## 2024-12-30 ENCOUNTER — Inpatient Hospital Stay

## 2024-12-31 ENCOUNTER — Inpatient Hospital Stay

## 2025-06-07 ENCOUNTER — Inpatient Hospital Stay

## 2025-06-07 ENCOUNTER — Inpatient Hospital Stay: Admitting: Oncology
